# Patient Record
Sex: Female | Born: 1945 | Race: White | Hispanic: No | Smoking: Never smoker
Health system: Southern US, Community
[De-identification: ages and names within clinical notes are randomized; demographics above are authoritative.]

## PROBLEM LIST (undated history)

## (undated) DIAGNOSIS — M109 Gout, unspecified: Secondary | ICD-10-CM

## (undated) DIAGNOSIS — F32A Depression, unspecified: Secondary | ICD-10-CM

## (undated) DIAGNOSIS — E119 Type 2 diabetes mellitus without complications: Secondary | ICD-10-CM

## (undated) DIAGNOSIS — F329 Major depressive disorder, single episode, unspecified: Secondary | ICD-10-CM

## (undated) DIAGNOSIS — E079 Disorder of thyroid, unspecified: Secondary | ICD-10-CM

## (undated) DIAGNOSIS — I10 Essential (primary) hypertension: Secondary | ICD-10-CM

## (undated) DIAGNOSIS — T7840XA Allergy, unspecified, initial encounter: Secondary | ICD-10-CM

## (undated) DIAGNOSIS — M779 Enthesopathy, unspecified: Secondary | ICD-10-CM

## (undated) DIAGNOSIS — F419 Anxiety disorder, unspecified: Secondary | ICD-10-CM

## (undated) DIAGNOSIS — M1990 Unspecified osteoarthritis, unspecified site: Secondary | ICD-10-CM

## (undated) DIAGNOSIS — J45909 Unspecified asthma, uncomplicated: Secondary | ICD-10-CM

## (undated) DIAGNOSIS — K219 Gastro-esophageal reflux disease without esophagitis: Secondary | ICD-10-CM

## (undated) HISTORY — DX: Major depressive disorder, single episode, unspecified: F32.9

## (undated) HISTORY — DX: Disorder of thyroid, unspecified: E07.9

## (undated) HISTORY — PX: EYE SURGERY: SHX253

## (undated) HISTORY — DX: Anxiety disorder, unspecified: F41.9

## (undated) HISTORY — DX: Gastro-esophageal reflux disease without esophagitis: K21.9

## (undated) HISTORY — PX: CHOLECYSTECTOMY: SHX55

## (undated) HISTORY — DX: Unspecified osteoarthritis, unspecified site: M19.90

## (undated) HISTORY — DX: Type 2 diabetes mellitus without complications: E11.9

## (undated) HISTORY — DX: Unspecified asthma, uncomplicated: J45.909

## (undated) HISTORY — DX: Essential (primary) hypertension: I10

## (undated) HISTORY — DX: Allergy, unspecified, initial encounter: T78.40XA

## (undated) HISTORY — DX: Depression, unspecified: F32.A

---

## 1970-04-02 HISTORY — PX: ABDOMINAL HYSTERECTOMY: SHX81

## 2005-03-08 ENCOUNTER — Ambulatory Visit: Payer: Self-pay | Admitting: Family Medicine

## 2006-03-13 ENCOUNTER — Ambulatory Visit: Payer: Self-pay

## 2011-01-15 ENCOUNTER — Ambulatory Visit: Payer: Self-pay | Admitting: General Surgery

## 2011-02-05 ENCOUNTER — Emergency Department: Payer: Self-pay | Admitting: Emergency Medicine

## 2014-01-15 DIAGNOSIS — F32A Depression, unspecified: Secondary | ICD-10-CM | POA: Insufficient documentation

## 2014-01-15 DIAGNOSIS — N952 Postmenopausal atrophic vaginitis: Secondary | ICD-10-CM | POA: Insufficient documentation

## 2014-01-15 DIAGNOSIS — M545 Low back pain, unspecified: Secondary | ICD-10-CM | POA: Insufficient documentation

## 2014-01-15 DIAGNOSIS — E039 Hypothyroidism, unspecified: Secondary | ICD-10-CM | POA: Insufficient documentation

## 2014-01-15 DIAGNOSIS — R0789 Other chest pain: Secondary | ICD-10-CM | POA: Insufficient documentation

## 2014-01-15 DIAGNOSIS — I129 Hypertensive chronic kidney disease with stage 1 through stage 4 chronic kidney disease, or unspecified chronic kidney disease: Secondary | ICD-10-CM | POA: Insufficient documentation

## 2014-01-15 DIAGNOSIS — F339 Major depressive disorder, recurrent, unspecified: Secondary | ICD-10-CM | POA: Insufficient documentation

## 2014-01-20 DIAGNOSIS — Z1211 Encounter for screening for malignant neoplasm of colon: Secondary | ICD-10-CM | POA: Insufficient documentation

## 2014-09-15 ENCOUNTER — Other Ambulatory Visit: Payer: Self-pay | Admitting: Family Medicine

## 2014-09-21 DIAGNOSIS — E669 Obesity, unspecified: Secondary | ICD-10-CM | POA: Insufficient documentation

## 2014-09-21 DIAGNOSIS — E039 Hypothyroidism, unspecified: Secondary | ICD-10-CM | POA: Insufficient documentation

## 2014-09-21 DIAGNOSIS — E559 Vitamin D deficiency, unspecified: Secondary | ICD-10-CM | POA: Insufficient documentation

## 2014-09-21 DIAGNOSIS — I130 Hypertensive heart and chronic kidney disease with heart failure and stage 1 through stage 4 chronic kidney disease, or unspecified chronic kidney disease: Secondary | ICD-10-CM | POA: Insufficient documentation

## 2014-09-21 DIAGNOSIS — R609 Edema, unspecified: Secondary | ICD-10-CM | POA: Insufficient documentation

## 2014-09-21 DIAGNOSIS — F329 Major depressive disorder, single episode, unspecified: Secondary | ICD-10-CM | POA: Insufficient documentation

## 2014-09-21 DIAGNOSIS — M109 Gout, unspecified: Secondary | ICD-10-CM | POA: Insufficient documentation

## 2014-09-21 DIAGNOSIS — F32A Depression, unspecified: Secondary | ICD-10-CM | POA: Insufficient documentation

## 2014-09-21 DIAGNOSIS — N183 Chronic kidney disease, stage 3 unspecified: Secondary | ICD-10-CM

## 2014-09-21 DIAGNOSIS — E7800 Pure hypercholesterolemia, unspecified: Secondary | ICD-10-CM | POA: Insufficient documentation

## 2014-09-21 DIAGNOSIS — E1122 Type 2 diabetes mellitus with diabetic chronic kidney disease: Secondary | ICD-10-CM | POA: Insufficient documentation

## 2014-09-21 DIAGNOSIS — R928 Other abnormal and inconclusive findings on diagnostic imaging of breast: Secondary | ICD-10-CM | POA: Insufficient documentation

## 2014-09-21 DIAGNOSIS — G4700 Insomnia, unspecified: Secondary | ICD-10-CM | POA: Insufficient documentation

## 2014-09-21 DIAGNOSIS — E668 Other obesity: Secondary | ICD-10-CM | POA: Insufficient documentation

## 2014-09-21 DIAGNOSIS — E119 Type 2 diabetes mellitus without complications: Secondary | ICD-10-CM | POA: Insufficient documentation

## 2014-09-21 DIAGNOSIS — N1832 Type 2 diabetes mellitus with diabetic chronic kidney disease: Secondary | ICD-10-CM | POA: Insufficient documentation

## 2014-09-21 DIAGNOSIS — K219 Gastro-esophageal reflux disease without esophagitis: Secondary | ICD-10-CM | POA: Insufficient documentation

## 2014-09-21 HISTORY — DX: Chronic kidney disease, stage 3 unspecified: N18.30

## 2014-10-22 ENCOUNTER — Other Ambulatory Visit: Payer: Self-pay | Admitting: Family Medicine

## 2014-10-22 NOTE — Telephone Encounter (Signed)
Pt is requesting samples of JANUVIA  100 MG if possible.  CB#[phone]

## 2014-10-26 NOTE — Telephone Encounter (Signed)
Pt is calling back to check on samples.  [phone]:[phone]

## 2014-10-26 NOTE — Telephone Encounter (Signed)
Ok to leave if available. Thanks.

## 2014-10-26 NOTE — Telephone Encounter (Signed)
Called Terryann, regarding the Januvia 100mg  samples that pt had requested. Aware that samples are ready for pick up. Gave 4 packets, worth for 28 days.

## 2014-11-08 ENCOUNTER — Encounter: Payer: Self-pay | Admitting: Family Medicine

## 2014-11-08 ENCOUNTER — Ambulatory Visit (INDEPENDENT_AMBULATORY_CARE_PROVIDER_SITE_OTHER): Payer: Medicare Other | Admitting: Family Medicine

## 2014-11-08 VITALS — BP 116/64 | HR 84 | Temp 98.7°F | Resp 16 | Ht 63.0 in | Wt 213.0 lb

## 2014-11-08 DIAGNOSIS — Z0000 Encounter for general adult medical examination without abnormal findings: Secondary | ICD-10-CM

## 2014-11-08 DIAGNOSIS — Z1211 Encounter for screening for malignant neoplasm of colon: Secondary | ICD-10-CM

## 2014-11-08 DIAGNOSIS — E559 Vitamin D deficiency, unspecified: Secondary | ICD-10-CM

## 2014-11-08 DIAGNOSIS — Z1239 Encounter for other screening for malignant neoplasm of breast: Secondary | ICD-10-CM

## 2014-11-08 DIAGNOSIS — N183 Chronic kidney disease, stage 3 unspecified: Secondary | ICD-10-CM

## 2014-11-08 DIAGNOSIS — E7800 Pure hypercholesterolemia, unspecified: Secondary | ICD-10-CM

## 2014-11-08 DIAGNOSIS — E119 Type 2 diabetes mellitus without complications: Secondary | ICD-10-CM

## 2014-11-08 DIAGNOSIS — E039 Hypothyroidism, unspecified: Secondary | ICD-10-CM | POA: Diagnosis not present

## 2014-11-08 MED ORDER — GLIPIZIDE ER 5 MG PO TB24: 5.0000 mg | ORAL_TABLET | Freq: Every day | ORAL | Status: DC

## 2014-11-08 NOTE — Progress Notes (Signed)
Patient ID: Donna Malone, female   DOB: Dec 30, 1945, 69 y.o.   MRN: [id number]       Patient: Donna Malone, Female    DOB: [birth year], 69 y.o.   MRN: [id number] Visit Date: 11/08/2014  Today's Provider: Margarita Rana, MD   Chief Complaint  Patient presents with  . Medicare Wellness  . Diabetes   Subjective:    Annual wellness visit Donna Malone is a 69 y.o. female. She feels well. She reports she is not exercising, stays active with grandchild. She reports she is sleeping well. 03/12/06 Pap-normal hysterectomy 1/72 12/08/13 Mammogram BI-RADS 2 02/07/12 BMD-WNL 12/03/13 EKG    Diabetes She presents for her follow-up diabetic visit. She has type 2 diabetes mellitus. Her disease course has been stable. There are no hypoglycemic associated symptoms. Associated symptoms include fatigue. Pertinent negatives for diabetes include no blurred vision, no chest pain, no foot paresthesias, no polydipsia, no polyphagia, no polyuria, no visual change, no weakness and no weight loss. Symptoms are stable. Current diabetic treatment includes oral agent (dual therapy). She is compliant with treatment all of the time. Her weight is stable. She has not had a previous visit with a dietitian. She never participates in exercise. Her breakfast blood glucose is taken between 8-9 am. Her breakfast blood glucose range is generally 140-180 mg/dl. She does not see a podiatrist.Eye exam is current.    -----------------------------------------------------------   Review of Systems  Constitutional: Positive for fatigue. Negative for weight loss.  Eyes: Positive for itching. Negative for blurred vision.  Respiratory: Negative.   Cardiovascular: Negative.  Negative for chest pain.  Gastrointestinal: Negative.   Endocrine: Positive for heat intolerance. Negative for polydipsia, polyphagia and polyuria.  Genitourinary: Negative.   Musculoskeletal: Positive for back pain and arthralgias.  Skin:  Negative.   Allergic/Immunologic: Negative.   Neurological: Negative for weakness.  Hematological: Negative.   Psychiatric/Behavioral: Positive for decreased concentration.    History   Social History  . Marital Status: Married    Spouse Name: [name]  . Number of Children: 3  . Years of Education: N/A   Occupational History  . Not on file.   Social History Main Topics  . Smoking status: Never Smoker   . Smokeless tobacco: Never Used  . Alcohol Use: No  . Drug Use: No  . Sexual Activity: Not on file   Other Topics Concern  . Not on file   Social History Narrative    Patient Active Problem List   Diagnosis Date Noted  . Abnormal finding on mammography 09/21/2014  . Acquired hypothyroidism 09/21/2014  . Adult BMI 30+ 09/21/2014  . Chronic kidney disease (CKD), stage III (moderate) 09/21/2014  . Diabetes mellitus, type 2 09/21/2014  . Clinical depression 09/21/2014  . Accumulation of fluid in tissues 09/21/2014  . Acid reflux 09/21/2014  . Gout 09/21/2014  . Hypercholesteremia 09/21/2014  . Heart & renal disease, hypertensive, with heart failure 09/21/2014  . Cannot sleep 09/21/2014  . Extreme obesity 09/21/2014  . Avitaminosis D 09/21/2014  . Adult hypothyroidism 01/15/2014  . LBP (low back pain) 01/15/2014  . Chest pain 01/15/2014  . Atrophic vaginitis 01/15/2014    Past Surgical History  Procedure Laterality Date  . Abdominal hysterectomy  04/1970    partial  . Cholecystectomy  late 1990's    Her family history includes CAD in her mother; Diabetes in her cousin; Heart attack in her mother; Heart disease in her father.    Previous Medications   ALLOPURINOL (  ZYLOPRIM) 100 MG TABLET    Take 100 mg by mouth daily.    ASPIRIN 81 MG TABLET    Take 81 mg by mouth daily.    ATORVASTATIN (LIPITOR) 40 MG TABLET    Take 40 mg by mouth daily.    ESCITALOPRAM (LEXAPRO) 20 MG TABLET    Take 20 mg by mouth daily.    FUROSEMIDE (LASIX) 40 MG TABLET    Take 40 mg by  mouth daily.    LEVOTHYROXINE (SYNTHROID, LEVOTHROID) 75 MCG TABLET    Take 75 mcg by mouth daily before breakfast.    LISINOPRIL (PRINIVIL,ZESTRIL) 5 MG TABLET    Take 5 mg by mouth daily.    METFORMIN (GLUCOPHAGE) 1000 MG TABLET    Take 1,000 mg by mouth 2 (two) times daily with a meal.    OMEGA 3 340 MG CPDR    Take 1 capsule by mouth daily.    ONE TOUCH ULTRA TEST TEST STRIP    CHECK FASTING BLOOD SUGAR ONCE A DAY   POTASSIUM CHLORIDE (MICRO-K) 10 MEQ CR CAPSULE    Take 10 mEq by mouth daily.    RANITIDINE (ZANTAC) 150 MG TABLET    Take 1 tablet by mouth 2 (two) times daily.   TRAZODONE (DESYREL) 100 MG TABLET    Take 100 mg by mouth at bedtime.    VITAMIN D, ERGOCALCIFEROL, (DRISDOL) 50000 UNITS CAPS CAPSULE    Take 50,000 Units by mouth every 7 (seven) days.     Patient Care Team: Margarita Rana, MD as PCP - General (Family Medicine)     Objective:   Vitals: BP 116/64 mmHg  Pulse 84  Temp(Src) 98.7 F (37.1 C) (Oral)  Resp 16  Ht 5\' 3"  (1.6 m)  Wt 213 lb (96.616 kg)  BMI 37.74 kg/m2  SpO2 98%  Physical Exam  Constitutional: She is oriented to person, place, and time. She appears well-developed and well-nourished.  HENT:  Head: Normocephalic and atraumatic.  Right Ear: Tympanic membrane, external ear and ear canal normal.  Left Ear: Tympanic membrane, external ear and ear canal normal.  Nose: Nose normal.  Mouth/Throat: Uvula is midline, oropharynx is clear and moist and mucous membranes are normal.  Eyes: Conjunctivae, EOM and lids are normal. Pupils are equal, round, and reactive to light.  Neck: Trachea normal and normal range of motion. Neck supple. Carotid bruit is not present. No thyroid mass and no thyromegaly present.  Cardiovascular: Normal rate, regular rhythm and normal heart sounds.   Pulmonary/Chest: Effort normal and breath sounds normal.  Abdominal: Soft. Normal appearance and bowel sounds are normal. There is no hepatosplenomegaly. There is no tenderness.    Genitourinary: No breast swelling, tenderness or discharge.  Musculoskeletal: Normal range of motion.  Lymphadenopathy:    She has no cervical adenopathy.    She has no axillary adenopathy.  Neurological: She is alert and oriented to person, place, and time. She has normal strength. No cranial nerve deficit.  Skin: Skin is warm, dry and intact.  Psychiatric: She has a normal mood and affect. Her speech is normal and behavior is normal. Judgment and thought content normal. Cognition and memory are normal.   Diabetic Foot Exam - Simple   Simple Foot Form  Diabetic Foot exam was performed with the following findings:  Yes 11/08/2014  4:33 PM  Visual Inspection  No deformities, no ulcerations, no other skin breakdown bilaterally:  Yes  Sensation Testing  Intact to touch and monofilament testing bilaterally:  Yes  Pulse Check  Posterior Tibialis and Dorsalis pulse intact bilaterally:  Yes  Comments       Activities of Daily Living In your present state of health, do you have any difficulty performing the following activities: 11/08/2014  Hearing? N  Vision? N  Difficulty concentrating or making decisions? Y  Walking or climbing stairs? N  Dressing or bathing? N  Doing errands, shopping? N    Fall Risk Assessment Fall Risk  11/08/2014  Falls in the past year? No     Depression Screen PHQ 2/9 Scores 11/08/2014  PHQ - 2 Score 0    Cognitive Testing - 6-CIT  Correct? Score   What year is it? yes 0 0 or 4  What month is it? yes 0 0 or 3  Memorize:    Pia Mau,  40,  [place] [address]  [place]      What time is it? (within 1 hour) yes 0 0 or 3  Count backwards from 20 yes 0 0, 2, or 4  Name the months of the year yes 0 0, 2, or 4  Repeat name & address above yes 0 0, 2, 4, 6, 8, or 10       TOTAL SCORE  0/28   Interpretation:  Normal  Normal (0-7) Abnormal (8-28)       Assessment & Plan:     Annual Wellness Visit  Reviewed patient's Family Medical History Reviewed and  updated list of patient's medical providers Assessment of cognitive impairment was done Assessed patient's functional ability Established a written schedule for health screening [place] Completed and Reviewed  Exercise Activities and Dietary recommendations Goals    . Exercise 150 minutes per week (moderate activity)    . Peak Blood Glucose < 180       Immunization History  Administered Date(s) Administered  . Pneumococcal Conjugate-13 12/03/2013  . Pneumococcal Polysaccharide-23 03/21/2011  . Tdap 04/09/2008    Health Maintenance  Topic Date Due  . HEMOGLOBIN A1C  1945/07/25  . Hepatitis C Screening  01/18/44  . FOOT EXAM  04/22/1953  . OPHTHALMOLOGY EXAM  04/22/1953  . URINE MICROALBUMIN  04/22/1953  . MAMMOGRAM  04/22/1993  . COLONOSCOPY  04/22/1993  . ZOSTAVAX  04/23/2003  . DEXA SCAN  04/22/2008  . INFLUENZA VACCINE  11/01/2014  . TETANUS/TDAP  04/09/2018  . PNA vac Low Risk Adult  Completed         1. Medicare annual wellness visit, subsequent Stable. Patient advised to continue eating healthy and exercise daily.  2. Type 2 diabetes mellitus without complication Not to goal. Patient started on glipizide 5 mg as below. Patient advised to D/C Januvia. Also warned not to take if does not eat. Recheck in 3 months.  - Hemoglobin A1c - Microalbumin, urine - glipiZIDE (GLUCOTROL XL) 5 MG 24 hr tablet; Take 1 tablet (5 mg total) by mouth daily with breakfast.  Dispense: 90 tablet; Refill: 3  3. Acquired hypothyroidism - TSH  4. Chronic kidney disease (CKD), stage III (moderate) - CBC with Differential/Platelet - Comprehensive metabolic panel  5. Avitaminosis D - Vit D  25 hydroxy (rtn osteoporosis monitoring)  6. Hypercholesteremia - Lipid Panel With LDL/HDL Ratio  7. Breast cancer screening   8. Colon cancer screening - Cologuard   Patient seen and examined by Dr. Jerrell Belfast, and note scribed by Philbert Riser. Dimas,  CMA.  I have reviewed the document for accuracy and completeness and I agree  with above. Jerrell Belfast, MD   Margarita Rana, MD      ------------------------------------------------------------------------------------------------------------

## 2014-11-09 LAB — COMPREHENSIVE METABOLIC PANEL
ALT: 16 IU/L (ref 0–32)
AST: 20 IU/L (ref 0–40)
Albumin/Globulin Ratio: 1.6 (ref 1.1–2.5)
Albumin: 4.2 g/dL (ref 3.5–4.8)
Alkaline Phosphatase: 69 IU/L (ref 39–117)
BILIRUBIN TOTAL: 0.4 mg/dL (ref 0.0–1.2)
BUN/Creatinine Ratio: 9 — ABNORMAL LOW (ref 11–26)
BUN: 13 mg/dL (ref 8–27)
CHLORIDE: 94 mmol/L — AB (ref 97–108)
CO2: 25 mmol/L (ref 18–29)
Calcium: 9.7 mg/dL (ref 8.7–10.3)
Creatinine, Ser: 1.37 mg/dL — ABNORMAL HIGH (ref 0.57–1.00)
GFR calc Af Amer: 45 mL/min/{1.73_m2} — ABNORMAL LOW (ref >59)
GFR, EST NON AFRICAN AMERICAN: 39 mL/min/{1.73_m2} — AB (ref >59)
GLUCOSE: 138 mg/dL — AB (ref 65–99)
Globulin, Total: 2.7 g/dL (ref 1.5–4.5)
Potassium: 4.6 mmol/L (ref 3.5–5.2)
Sodium: 139 mmol/L (ref 134–144)
Total Protein: 6.9 g/dL (ref 6.0–8.5)

## 2014-11-09 LAB — CBC WITH DIFFERENTIAL/PLATELET
BASOS: 1 %
Basophils Absolute: 0.1 10*3/uL (ref 0.0–0.2)
EOS (ABSOLUTE): 0.3 10*3/uL (ref 0.0–0.4)
Eos: 3 %
HEMOGLOBIN: 13.4 g/dL (ref 11.1–15.9)
Hematocrit: 38.8 % (ref 34.0–46.6)
IMMATURE GRANULOCYTES: 0 %
Immature Grans (Abs): 0 10*3/uL (ref 0.0–0.1)
LYMPHS ABS: 4.1 10*3/uL — AB (ref 0.7–3.1)
Lymphs: 35 %
MCH: 30.6 pg (ref 26.6–33.0)
MCHC: 34.5 g/dL (ref 31.5–35.7)
MCV: 89 fL (ref 79–97)
Monocytes Absolute: 1.2 10*3/uL — ABNORMAL HIGH (ref 0.1–0.9)
Monocytes: 10 %
Neutrophils Absolute: 6 10*3/uL (ref 1.4–7.0)
Neutrophils: 51 %
Platelets: 398 10*3/uL — ABNORMAL HIGH (ref 150–379)
RBC: 4.38 x10E6/uL (ref 3.77–5.28)
RDW: 14.2 % (ref 12.3–15.4)
WBC: 11.7 10*3/uL — ABNORMAL HIGH (ref 3.4–10.8)

## 2014-11-09 LAB — LIPID PANEL WITH LDL/HDL RATIO
CHOLESTEROL TOTAL: 232 mg/dL — AB (ref 100–199)
HDL: 52 mg/dL (ref >39)
TRIGLYCERIDES: 477 mg/dL — AB (ref 0–149)

## 2014-11-09 LAB — TSH: TSH: 0.957 u[IU]/mL (ref 0.450–4.500)

## 2014-11-09 LAB — VITAMIN D 25 HYDROXY (VIT D DEFICIENCY, FRACTURES): Vit D, 25-Hydroxy: 43.8 ng/mL (ref 30.0–100.0)

## 2014-11-09 LAB — HEMOGLOBIN A1C
Est. average glucose Bld gHb Est-mCnc: 131 mg/dL
HEMOGLOBIN A1C: 6.2 % — AB (ref 4.8–5.6)

## 2014-11-09 LAB — MICROALBUMIN, URINE: Microalbumin, Urine: 32.8 ug/mL

## 2014-11-10 ENCOUNTER — Telehealth: Payer: Self-pay

## 2014-11-10 NOTE — Telephone Encounter (Signed)
-----   Message from Margarita Rana, MD sent at 11/09/2014  9:54 PM EDT ----- Labs stable. HgbA1c is stable at 6.2.  White count is very mildly elevated. Recheck in 4 weeks. Triglycerides up. Make sure to work on decreasing sugar, eating healthy, continue cholesterol medication and recheck in 3 months. Thanks.

## 2014-11-10 NOTE — Telephone Encounter (Signed)
Left message to call back.  Thanks, 

## 2014-11-10 NOTE — Telephone Encounter (Signed)
Advised pt of the notes below, pt verbalized fully understanding.  Thanks,

## 2014-11-16 ENCOUNTER — Telehealth: Payer: Self-pay | Admitting: Family Medicine

## 2014-11-16 NOTE — Telephone Encounter (Signed)
If she has been on this continue this medication

## 2014-11-16 NOTE — Telephone Encounter (Signed)
See below

## 2014-11-16 NOTE — Telephone Encounter (Signed)
[facility] received the RX for glipiZIDE (GLUCOTROL XL) 5 MG 24 hr tablet and they have a note that pt is allergic to sulfa drugs and she wanted to make sure we wanted to continue with the RX. I advised that Dr. Venia Minks is out of the office this week. Thanks TNP

## 2014-11-16 NOTE — Telephone Encounter (Signed)
I cannot remember if glimepiride has the same potential out allergic response. Consider glimepiride at 4 mg every morning. If he comes back with the same response libido.

## 2014-11-16 NOTE — Telephone Encounter (Signed)
Spoke with Klan from [facility], he stated that Glimepiride also has a potential for allergic reaction to sulfa drugs. Please advise. Thanks,

## 2014-11-16 NOTE — Telephone Encounter (Signed)
It looks like this is her first prescription for glipizide, in the notes on her last ov, it states that her Celesta Gentile was changed to glipizide. Please advise.  Thanks,

## 2014-11-17 NOTE — Telephone Encounter (Signed)
What is the reaction to sulfa drugs?

## 2014-11-17 NOTE — Telephone Encounter (Signed)
Please call patient and see what her reaction was. Think that is what Dr. Rosanna Randy was asking. Please let her know about possible allergic reaction. May need to go back to [facility], or try Glipizide understanding may have a reaction, may not as is it not a sulfa antibiotic.  If rash, can try it, if SOB, is going to have to go back to Januvia or try to be better about her diet. Thanks.

## 2014-11-17 NOTE — Telephone Encounter (Signed)
See below, please advise.  Thanks,

## 2014-11-18 NOTE — Telephone Encounter (Signed)
Left message to call back, will try again later.  Thanks,

## 2014-11-18 NOTE — Telephone Encounter (Signed)
Left message to call back. Will try to call back later.  Thanks,

## 2014-11-19 NOTE — Telephone Encounter (Signed)
Left message to call back. Will try again later.  Thanks,

## 2014-11-19 NOTE — Telephone Encounter (Signed)
Called pt on daughter's phone #, pt stated that she is on vacations, phone call got disconnected in the middle of the conversation; unable to reach her again.  will try to call her later.  Thanks,

## 2014-11-19 NOTE — Telephone Encounter (Signed)
Spoke with pt, pt stated that she is on vacation at this point and that she will be back by this Sunday, she also stated that she has been taking the Green for now and that she wants to try the Glipizide; she stated that she does not remember what the side effect for the sulfa antibiotics was. Advised pt to call if any questions or concerns.  Pharmacy was notified to approve the prescription, spoke with [facility] pharmacist.  Thanks,

## 2014-12-09 LAB — FECAL OCCULT BLOOD, GUAIAC: Fecal Occult Blood: NEGATIVE

## 2014-12-09 LAB — COLOGUARD

## 2014-12-13 ENCOUNTER — Ambulatory Visit: Payer: Medicare Other | Admitting: Family Medicine

## 2014-12-21 ENCOUNTER — Telehealth: Payer: Self-pay | Admitting: Family Medicine

## 2014-12-21 DIAGNOSIS — D72829 Elevated white blood cell count, unspecified: Secondary | ICD-10-CM

## 2014-12-21 NOTE — Telephone Encounter (Signed)
Ok to print out lab slip. Thanks.

## 2014-12-21 NOTE — Telephone Encounter (Signed)
Pt called wanting to know if she could pick up a lab slip tomorrow am to go ahead and get her labs done.  She has an appt Friday morning with you.     Her call back is [phone]  Thanks Con Memos

## 2014-12-22 DIAGNOSIS — D72829 Elevated white blood cell count, unspecified: Secondary | ICD-10-CM | POA: Insufficient documentation

## 2014-12-22 NOTE — Telephone Encounter (Signed)
Left message advising pt her lab slip is at the front desk.   Thanks,   -Mickel Baas

## 2014-12-23 ENCOUNTER — Telehealth: Payer: Self-pay

## 2014-12-23 LAB — CBC WITH DIFFERENTIAL/PLATELET
BASOS ABS: 0.1 10*3/uL (ref 0.0–0.2)
Basos: 1 %
EOS (ABSOLUTE): 0.3 10*3/uL (ref 0.0–0.4)
EOS: 3 %
HEMATOCRIT: 35.1 % (ref 34.0–46.6)
HEMOGLOBIN: 11.8 g/dL (ref 11.1–15.9)
IMMATURE GRANS (ABS): 0 10*3/uL (ref 0.0–0.1)
Immature Granulocytes: 0 %
LYMPHS ABS: 2.4 10*3/uL (ref 0.7–3.1)
Lymphs: 32 %
MCH: 30 pg (ref 26.6–33.0)
MCHC: 33.6 g/dL (ref 31.5–35.7)
MCV: 89 fL (ref 79–97)
Monocytes Absolute: 0.7 10*3/uL (ref 0.1–0.9)
Monocytes: 9 %
NEUTROS ABS: 4.1 10*3/uL (ref 1.4–7.0)
Neutrophils: 55 %
Platelets: 319 10*3/uL (ref 150–379)
RBC: 3.93 x10E6/uL (ref 3.77–5.28)
RDW: 14.7 % (ref 12.3–15.4)
WBC: 7.5 10*3/uL (ref 3.4–10.8)

## 2014-12-23 NOTE — Telephone Encounter (Signed)
Pt advised.   Thanks,   -Laura  

## 2014-12-23 NOTE — Telephone Encounter (Signed)
-----   Message from Margarita Rana, MD sent at 12/23/2014 10:30 AM EDT ----- Normal. Thanks.

## 2014-12-24 ENCOUNTER — Ambulatory Visit: Payer: Medicare Other | Admitting: Family Medicine

## 2015-01-07 ENCOUNTER — Encounter: Payer: Self-pay | Admitting: Family Medicine

## 2015-01-21 DIAGNOSIS — E119 Type 2 diabetes mellitus without complications: Secondary | ICD-10-CM | POA: Diagnosis not present

## 2015-01-21 DIAGNOSIS — H2513 Age-related nuclear cataract, bilateral: Secondary | ICD-10-CM | POA: Diagnosis not present

## 2015-01-21 DIAGNOSIS — H35039 Hypertensive retinopathy, unspecified eye: Secondary | ICD-10-CM | POA: Diagnosis not present

## 2015-01-21 DIAGNOSIS — I10 Essential (primary) hypertension: Secondary | ICD-10-CM | POA: Diagnosis not present

## 2015-02-09 ENCOUNTER — Ambulatory Visit (INDEPENDENT_AMBULATORY_CARE_PROVIDER_SITE_OTHER): Payer: Medicare Other | Admitting: Family Medicine

## 2015-02-09 ENCOUNTER — Encounter: Payer: Self-pay | Admitting: Family Medicine

## 2015-02-09 VITALS — BP 108/68 | HR 84 | Temp 97.7°F | Resp 16 | Wt 217.0 lb

## 2015-02-09 DIAGNOSIS — E119 Type 2 diabetes mellitus without complications: Secondary | ICD-10-CM

## 2015-02-09 DIAGNOSIS — Z23 Encounter for immunization: Secondary | ICD-10-CM | POA: Diagnosis not present

## 2015-02-09 LAB — POCT GLYCOSYLATED HEMOGLOBIN (HGB A1C)
ESTIMATED AVERAGE GLUCOSE: 134
Hemoglobin A1C: 6.4

## 2015-02-09 NOTE — Progress Notes (Signed)
Subjective:    Patient ID: Donna Malone, female    DOB: 31-Aug-1945, 69 y.o.   MRN: [id number]  Diabetes She presents for her follow-up (Last A1C 11/08/2014 6.2%) diabetic visit. She has type 2 diabetes mellitus. There are no hypoglycemic associated symptoms. Associated symptoms include fatigue and polydipsia. Pertinent negatives for diabetes include no blurred vision, no chest pain, no foot paresthesias, no foot ulcerations, no polyphagia, no polyuria, no visual change, no weakness and no weight loss. Current diabetic treatment includes oral agent (dual therapy) (Metformin 1000 mg BID, Glipizide 5 mg). She is compliant with treatment all of the time. Her home blood glucose trend is increasing steadily (pt reports this is due to changing Januvia to Glipizide (due to medication cost)). An ACE inhibitor/angiotensin II receptor blocker is being taken. Eye exam is current (October 2016- Dr. Sherran Needs- [facility]).  Hyperlipidemia This is a chronic problem. Recent lipid tests were reviewed and are high (11/08/2014- Total- 232, Trig- 477, HDL- 52, LDL- not calculated due to high triglycerides). Associated symptoms include shortness of breath (on exertion). Pertinent negatives include no chest pain, focal sensory loss, focal weakness, leg pain or myalgias. Current antihyperlipidemic treatment includes diet change and statins (Atorvastatin 40 mg). Compliance problems include adherence to diet.       Review of Systems  Constitutional: Positive for fatigue. Negative for weight loss.  Eyes: Negative for blurred vision.  Respiratory: Positive for shortness of breath (on exertion).   Cardiovascular: Negative for chest pain.  Endocrine: Positive for polydipsia. Negative for polyphagia and polyuria.  Musculoskeletal: Negative for myalgias.  Neurological: Negative for focal weakness and weakness.   BP 108/68 mmHg  Pulse 84  Temp(Src) 97.7 F (36.5 C) (Oral)  Resp 16  Wt 217 lb  (98.431 kg)  SpO2 97%   Patient Active Problem List   Diagnosis Date Noted  . Elevated WBC count 12/22/2014  . Abnormal finding on mammography 09/21/2014  . Acquired hypothyroidism 09/21/2014  . Adult BMI 30+ 09/21/2014  . Chronic kidney disease (CKD), stage III (moderate) 09/21/2014  . Diabetes mellitus, type 2 ([place]) 09/21/2014  . Clinical depression 09/21/2014  . Accumulation of fluid in tissues 09/21/2014  . Acid reflux 09/21/2014  . Gout 09/21/2014  . Hypercholesteremia 09/21/2014  . Heart & renal disease, hypertensive, with heart failure ([place]) 09/21/2014  . Cannot sleep 09/21/2014  . Extreme obesity ([place]) 09/21/2014  . Avitaminosis D 09/21/2014  . Adult hypothyroidism 01/15/2014  . LBP (low back pain) 01/15/2014  . Chest pain 01/15/2014  . Atrophic vaginitis 01/15/2014   Past Medical History  Diagnosis Date  . Asthma   . Diabetes mellitus without complication ([place])   . GERD (gastroesophageal reflux disease)   . Hypertension   . Thyroid disease   . Depression    Current Outpatient Prescriptions on File Prior to Visit  Medication Sig  . aspirin 81 MG tablet Take 81 mg by mouth daily.   Marland Kitchen atorvastatin (LIPITOR) 40 MG tablet Take 40 mg by mouth daily.   Marland Kitchen escitalopram (LEXAPRO) 20 MG tablet Take 20 mg by mouth daily.   . furosemide (LASIX) 40 MG tablet Take 40 mg by mouth daily.   Marland Kitchen glipiZIDE (GLUCOTROL XL) 5 MG 24 hr tablet Take 1 tablet (5 mg total) by mouth daily with breakfast.  . levothyroxine (SYNTHROID, LEVOTHROID) 75 MCG tablet Take 75 mcg by mouth daily before breakfast.   . lisinopril (PRINIVIL,ZESTRIL) 5 MG tablet Take 5 mg by mouth daily.   Marland Kitchen  metFORMIN (GLUCOPHAGE) 1000 MG tablet Take 1,000 mg by mouth 2 (two) times daily with a meal.   . Omega 3 340 MG CPDR Take 1 capsule by mouth daily.   . ONE TOUCH ULTRA TEST test strip CHECK FASTING BLOOD SUGAR ONCE A DAY  . potassium chloride (MICRO-K) 10 MEQ CR capsule Take 10 mEq by mouth daily.   . ranitidine  (ZANTAC) 150 MG tablet Take 1 tablet by mouth 2 (two) times daily.  . traZODone (DESYREL) 100 MG tablet Take 100 mg by mouth at bedtime.   . Vitamin D, Ergocalciferol, (DRISDOL) 50000 UNITS CAPS capsule Take 50,000 Units by mouth every 7 (seven) days.   Marland Kitchen allopurinol (ZYLOPRIM) 100 MG tablet Take 100 mg by mouth daily.    No current facility-administered medications on file prior to visit.   Allergies  Allergen Reactions  . Sulfa Antibiotics    Past Surgical History  Procedure Laterality Date  . Abdominal hysterectomy  04/1970    partial  . Cholecystectomy  late 1990's   Social History   Social History  . Marital Status: Married    Spouse Name: [name]  . Number of Children: 3  . Years of Education: N/A   Occupational History  . Not on file.   Social History Main Topics  . Smoking status: Never Smoker   . Smokeless tobacco: Never Used  . Alcohol Use: No  . Drug Use: No  . Sexual Activity: Not on file   Other Topics Concern  . Not on file   Social History Narrative   Family History  Problem Relation Age of Onset  . CAD Mother   . Heart attack Mother   . Heart disease Father   . Diabetes Cousin       Objective:   Physical Exam  Constitutional: She is oriented to person, place, and time. She appears well-developed and well-nourished.  Cardiovascular: Normal rate and regular rhythm.   Pulmonary/Chest: Effort normal and breath sounds normal.  Neurological: She is alert and oriented to person, place, and time.  Psychiatric: She has a normal mood and affect. Her behavior is normal. Judgment and thought content normal.   BP 108/68 mmHg  Pulse 84  Temp(Src) 97.7 F (36.5 C) (Oral)  Resp 16  Wt 217 lb (98.431 kg)  SpO2 97%      Assessment & Plan:  1. Type 2 diabetes mellitus without complication, without long-term current use of insulin (HCC) Stable with medication change. Continue lifestyle c - POCT glycosylated hemoglobin (Hb A1C) Results for orders placed  or performed in visit on 02/09/15  POCT glycosylated hemoglobin (Hb A1C)  Result Value Ref Range   Hemoglobin A1C 6.4    Est. average glucose Bld gHb Est-mCnc 134     2. Flu vaccine need Given today.   - Flu vaccine HIGH DOSE PF J  Margarita Rana, MD

## 2015-02-23 ENCOUNTER — Other Ambulatory Visit: Payer: Self-pay | Admitting: Family Medicine

## 2015-02-23 DIAGNOSIS — E559 Vitamin D deficiency, unspecified: Secondary | ICD-10-CM

## 2015-02-23 NOTE — Telephone Encounter (Signed)
Last Vitamin D was checked 11/08/2014 and was 43.8. Renaldo Fiddler, CMA

## 2015-02-25 ENCOUNTER — Other Ambulatory Visit: Payer: Self-pay | Admitting: Family Medicine

## 2015-04-01 ENCOUNTER — Other Ambulatory Visit: Payer: Self-pay | Admitting: Family Medicine

## 2015-04-01 DIAGNOSIS — I10 Essential (primary) hypertension: Secondary | ICD-10-CM

## 2015-04-01 DIAGNOSIS — R609 Edema, unspecified: Secondary | ICD-10-CM

## 2015-04-01 DIAGNOSIS — G4700 Insomnia, unspecified: Secondary | ICD-10-CM

## 2015-04-01 DIAGNOSIS — E119 Type 2 diabetes mellitus without complications: Secondary | ICD-10-CM

## 2015-04-01 DIAGNOSIS — E7800 Pure hypercholesterolemia, unspecified: Secondary | ICD-10-CM

## 2015-04-01 DIAGNOSIS — E1159 Type 2 diabetes mellitus with other circulatory complications: Secondary | ICD-10-CM | POA: Insufficient documentation

## 2015-04-01 DIAGNOSIS — E038 Other specified hypothyroidism: Secondary | ICD-10-CM

## 2015-04-01 DIAGNOSIS — I152 Hypertension secondary to endocrine disorders: Secondary | ICD-10-CM | POA: Insufficient documentation

## 2015-04-01 NOTE — Telephone Encounter (Signed)
LOV 02/09/2015 and has FU scheduled. Renaldo Fiddler, CMA

## 2015-04-13 ENCOUNTER — Other Ambulatory Visit: Payer: Self-pay | Admitting: Family Medicine

## 2015-04-13 DIAGNOSIS — K219 Gastro-esophageal reflux disease without esophagitis: Secondary | ICD-10-CM

## 2015-04-13 DIAGNOSIS — I10 Essential (primary) hypertension: Secondary | ICD-10-CM

## 2015-04-13 DIAGNOSIS — E559 Vitamin D deficiency, unspecified: Secondary | ICD-10-CM

## 2015-04-13 DIAGNOSIS — G4700 Insomnia, unspecified: Secondary | ICD-10-CM

## 2015-04-13 DIAGNOSIS — F32A Depression, unspecified: Secondary | ICD-10-CM

## 2015-04-13 DIAGNOSIS — E7800 Pure hypercholesterolemia, unspecified: Secondary | ICD-10-CM

## 2015-04-13 DIAGNOSIS — E038 Other specified hypothyroidism: Secondary | ICD-10-CM

## 2015-04-13 DIAGNOSIS — F329 Major depressive disorder, single episode, unspecified: Secondary | ICD-10-CM

## 2015-04-13 DIAGNOSIS — R609 Edema, unspecified: Secondary | ICD-10-CM

## 2015-04-13 DIAGNOSIS — E119 Type 2 diabetes mellitus without complications: Secondary | ICD-10-CM

## 2015-04-13 MED ORDER — LEVOTHYROXINE SODIUM 75 MCG PO TABS: ORAL_TABLET | ORAL | Status: DC

## 2015-04-13 MED ORDER — ATORVASTATIN CALCIUM 40 MG PO TABS: ORAL_TABLET | ORAL | Status: DC

## 2015-04-13 MED ORDER — RANITIDINE HCL 150 MG PO TABS: 150.0000 mg | ORAL_TABLET | Freq: Two times a day (BID) | ORAL | Status: DC

## 2015-04-13 MED ORDER — ESCITALOPRAM OXALATE 20 MG PO TABS: 20.0000 mg | ORAL_TABLET | Freq: Every day | ORAL | Status: DC

## 2015-04-13 MED ORDER — TRAZODONE HCL 100 MG PO TABS: ORAL_TABLET | ORAL | Status: DC

## 2015-04-13 MED ORDER — GLIPIZIDE ER 5 MG PO TB24
5.0000 mg | ORAL_TABLET | Freq: Every day | ORAL | Status: DC
Start: 2015-04-13 — End: 2016-05-22

## 2015-04-13 MED ORDER — LISINOPRIL 5 MG PO TABS: ORAL_TABLET | ORAL | Status: DC

## 2015-04-13 MED ORDER — FUROSEMIDE 40 MG PO TABS: 40.0000 mg | ORAL_TABLET | Freq: Every day | ORAL | Status: DC

## 2015-04-13 MED ORDER — POTASSIUM CHLORIDE ER 10 MEQ PO CPCR: ORAL_CAPSULE | ORAL | Status: DC

## 2015-04-13 MED ORDER — METFORMIN HCL 1000 MG PO TABS: ORAL_TABLET | ORAL | Status: DC

## 2015-04-13 NOTE — Telephone Encounter (Signed)
Spoke with pt she does not need Allopurinol or Vitamin D refilled yet.   Thanks,   -Mickel Baas

## 2015-04-13 NOTE — Telephone Encounter (Signed)
Pt called in saying she switched insurances to [facility]TA this year and she will need all new prescriptions.  She is using the mail order pharmacy for [facility]TA.  Her call back is [phone]  Thanks, Con Memos

## 2015-04-13 NOTE — Telephone Encounter (Signed)
Pt called with the mail order pharmacy info.  [place] phone [phone] and fax [phone]

## 2015-05-09 ENCOUNTER — Other Ambulatory Visit: Payer: Self-pay | Admitting: Family Medicine

## 2015-05-09 DIAGNOSIS — E559 Vitamin D deficiency, unspecified: Secondary | ICD-10-CM

## 2015-06-13 ENCOUNTER — Ambulatory Visit (INDEPENDENT_AMBULATORY_CARE_PROVIDER_SITE_OTHER): Payer: PPO | Admitting: Family Medicine

## 2015-06-13 ENCOUNTER — Encounter: Payer: Self-pay | Admitting: Family Medicine

## 2015-06-13 VITALS — BP 134/86 | HR 84 | Temp 97.8°F | Resp 16 | Wt 218.0 lb

## 2015-06-13 DIAGNOSIS — M545 Low back pain: Secondary | ICD-10-CM

## 2015-06-13 DIAGNOSIS — E119 Type 2 diabetes mellitus without complications: Secondary | ICD-10-CM

## 2015-06-13 DIAGNOSIS — M25562 Pain in left knee: Secondary | ICD-10-CM

## 2015-06-13 DIAGNOSIS — I10 Essential (primary) hypertension: Secondary | ICD-10-CM

## 2015-06-13 DIAGNOSIS — E7800 Pure hypercholesterolemia, unspecified: Secondary | ICD-10-CM | POA: Diagnosis not present

## 2015-06-13 LAB — POCT GLYCOSYLATED HEMOGLOBIN (HGB A1C): Hemoglobin A1C: 6.3

## 2015-06-13 NOTE — Progress Notes (Signed)
Patient ID: Donna Malone, female   DOB: 1946-01-14, 70 y.o.   MRN: [id number]         Patient: Donna Malone Female    DOB: 12-Aug-1945   70 y.o.   MRN: [id number] Visit Date: 06/13/2015  Today's Provider: Margarita Rana, MD   Chief Complaint  Patient presents with  . Hypertension  . Hyperlipidemia  . Diabetes  . Back Pain  . Knee Pain   Subjective:    Knee Pain  The incident occurred more than 1 week ago. There was no injury mechanism. The pain is present in the left knee. The quality of the pain is described as aching and shooting. The pain has been improving (Slightly improved) since onset. Pertinent negatives include no inability to bear weight, loss of motion, loss of sensation, muscle weakness, numbness or tingling. She has tried acetaminophen for the symptoms. The treatment provided no relief.  Back Pain This is a new problem. The current episode started 1 to 4 weeks ago. The problem has been gradually improving since onset. The pain is present in the lumbar spine. The quality of the pain is described as aching. The symptoms are aggravated by position, standing, sitting, coughing and stress. Pertinent negatives include no abdominal pain, bladder incontinence, bowel incontinence, fever, headaches, numbness, tingling or weakness. She has tried analgesics for the symptoms. The treatment provided no relief.      Diabetes Mellitus Type II, Follow-up:   Lab Results  Component Value Date   HGBA1C 6.4 02/09/2015   HGBA1C 6.2* 11/08/2014   Last seen for diabetes 3 months ago.  Management since then includes None. She reports excellent compliance with treatment. She is having side effects.  Current symptoms include none and have been stable. Home blood sugar records: 100's-120's  Episodes of hypoglycemia? no   Most Recent Eye Exam:  Weight trend: stable Current diet: in general, a "healthy" diet     ------------------------------------------------------------------------   Hypertension, follow-up:  BP Readings from Last 3 Encounters:  06/13/15 134/86  02/09/15 108/68  11/08/14 116/64    She was last seen for hypertension 3 months ago.  Management since that visit includes None .She reports excellent compliance with treatment. She is not having side effects.  She is not exercising. She is adherent to low salt diet.   She is experiencing none.  Patient denies chest pain, lower extremity edema and palpitations.   Cardiovascular risk factors include advanced age (older than 43 for men, 48 for women), diabetes mellitus, dyslipidemia and hypertension.  ------------------------------------------------------------------------    Lipid/Cholesterol, Follow-up:   Last seen for this 4 months ago.  Management since that visit includes None.  Last Lipid Panel:    Component Value Date/Time   CHOL 232* 11/08/2014 1659   TRIG 477* 11/08/2014 1659   HDL 52 11/08/2014 1659   [place] Comment 11/08/2014 1659    She reports excellent compliance with treatment. She is not having side effects.  Wt Readings from Last 3 Encounters:  06/13/15 218 lb (98.884 kg)  02/09/15 217 lb (98.431 kg)  11/08/14 213 lb (96.616 kg)    ------------------------------------------------------------------------        Allergies  Allergen Reactions  . Sulfa Antibiotics    Previous Medications   ACETAMINOPHEN (TYLENOL) 500 MG TABLET    Take 1,000 mg by mouth every 8 (eight) hours as needed.   ALLOPURINOL (ZYLOPRIM) 100 MG TABLET    Take 100 mg by mouth daily.    ASPIRIN 81 MG TABLET  Take 81 mg by mouth daily.    ATORVASTATIN (LIPITOR) 40 MG TABLET    Take 1 tablet by mouth  daily   ESCITALOPRAM (LEXAPRO) 20 MG TABLET    Take 1 tablet (20 mg total) by mouth daily.   FUROSEMIDE (LASIX) 40 MG TABLET    Take 1 tablet (40 mg total) by mouth daily.   GLIPIZIDE (GLUCOTROL XL) 5 MG 24 HR TABLET     Take 1 tablet (5 mg total) by mouth daily with breakfast.   LEVOTHYROXINE (SYNTHROID, LEVOTHROID) 75 MCG TABLET    Take 1 tablet by mouth  daily   LISINOPRIL (PRINIVIL,ZESTRIL) 5 MG TABLET    Take 1 tablet by mouth  daily   METFORMIN (GLUCOPHAGE) 1000 MG TABLET    Take 1 tablet by mouth two  times daily   OMEGA 3 340 MG CPDR    Take 1 capsule by mouth daily.    ONE TOUCH ULTRA TEST TEST STRIP    TEST BLOOD SUGAR ONCE DAILY   POTASSIUM CHLORIDE (MICRO-K) 10 MEQ CR CAPSULE    Take 1 capsule by mouth  daily   RANITIDINE (ZANTAC) 150 MG TABLET    Take 1 tablet (150 mg total) by mouth 2 (two) times daily.   TRAZODONE (DESYREL) 100 MG TABLET    Take 1 tablet by mouth at  bedtime   VITAMIN D, ERGOCALCIFEROL, (DRISDOL) 50000 UNITS CAPS CAPSULE    TAKE ONE CAPSULE BY MOUTH ONCE A WEEK    Review of Systems  Constitutional: Negative for fever, chills, diaphoresis, activity change, appetite change, fatigue and unexpected weight change.  Respiratory: Negative.   Cardiovascular: Negative.   Gastrointestinal: Negative.  Negative for abdominal pain and bowel incontinence.  Endocrine: Negative for cold intolerance and heat intolerance.  Genitourinary: Negative for bladder incontinence.  Musculoskeletal: Positive for back pain and arthralgias. Negative for myalgias, joint swelling, gait problem, neck pain and neck stiffness.  Neurological: Negative for dizziness, tingling, weakness, light-headedness, numbness and headaches.    Social History  Substance Use Topics  . Smoking status: Never Smoker   . Smokeless tobacco: Never Used  . Alcohol Use: No   Objective:   BP 134/86 mmHg  Pulse 84  Temp(Src) 97.8 F (36.6 C) (Oral)  Resp 16  Wt 218 lb (98.884 kg)  Results for orders placed or performed in visit on 06/13/15  POCT glycosylated hemoglobin (Hb A1C)  Result Value Ref Range   Hemoglobin A1C 6.3     Physical Exam  Constitutional: She is oriented to person, place, and time. She appears  well-developed and well-nourished.  Cardiovascular: Normal rate, regular rhythm and normal heart sounds.   Pulmonary/Chest: Effort normal and breath sounds normal.  Musculoskeletal: Normal range of motion.       Right knee: Tenderness found.       Left knee: Tenderness found.  Neurological: She is alert and oriented to person, place, and time.  Psychiatric: She has a normal mood and affect. Her behavior is normal. Judgment and thought content normal.        Assessment & Plan:     1. Essential hypertension Stable continue current meds.  2. Type 2 diabetes mellitus without complication, without long-term current use of insulin (HCC) A1C stable at 6.3%, recheck in 3-4 months.  - POCT glycosylated hemoglobin (Hb A1C)  3. Hypercholesteremia Still will recheck in the summer.   4. Bilateral low back pain, with sciatica presence unspecified Worsening; advised pt to increase walking and be careful about  heavy lifting.   5. Left knee pain New problem; will refer to orthopedics if not improved for worsening.      Patient was seen and examined by Jerrell Belfast, MD, and note scribed by Ashley Royalty, CMA.  I have reviewed the document for accuracy and completeness and I agree with above. - Jerrell Belfast, MD   Margarita Rana, MD  [facility]

## 2015-08-01 ENCOUNTER — Other Ambulatory Visit: Payer: Self-pay | Admitting: Family Medicine

## 2015-08-01 DIAGNOSIS — E559 Vitamin D deficiency, unspecified: Secondary | ICD-10-CM

## 2015-09-19 ENCOUNTER — Ambulatory Visit
Admission: RE | Admit: 2015-09-19 | Discharge: 2015-09-19 | Disposition: A | Discharge status: Home / Self Care | Payer: PPO | Source: Ambulatory Visit | Attending: Family Medicine | Admitting: Family Medicine

## 2015-09-19 ENCOUNTER — Encounter: Payer: Self-pay | Admitting: Family Medicine

## 2015-09-19 ENCOUNTER — Ambulatory Visit (INDEPENDENT_AMBULATORY_CARE_PROVIDER_SITE_OTHER): Payer: PPO | Admitting: Family Medicine

## 2015-09-19 VITALS — BP 110/64 | HR 86 | Temp 98.4°F | Resp 16 | Wt 214.0 lb

## 2015-09-19 DIAGNOSIS — J40 Bronchitis, not specified as acute or chronic: Secondary | ICD-10-CM | POA: Insufficient documentation

## 2015-09-19 DIAGNOSIS — R05 Cough: Secondary | ICD-10-CM | POA: Diagnosis not present

## 2015-09-19 MED ORDER — CEFDINIR 300 MG PO CAPS: 300.0000 mg | ORAL_CAPSULE | Freq: Two times a day (BID) | ORAL | Status: DC

## 2015-09-19 NOTE — Progress Notes (Signed)
Patient: Donna Malone Female    DOB: 1945-10-15   70 y.o.   MRN: [id number] Visit Date: 09/19/2015  Today's Provider: Margarita Rana, MD   Chief Complaint  Patient presents with  . URI   Subjective:    HPI    Upper Respiratory Infection: Patient complains of symptoms of a URI. Symptoms include congestion, cough and sore throat. Onset of symptoms was 1 week ago, gradually worsening since that time. She also c/o nasal congestion, post nasal drip, productive cough with  "olive green" colored sputum, sneezing and sore throat. Treatment to date: coricidin and cough drops, without relief. Did cough up green sputum in office today.        Allergies  Allergen Reactions  . Sulfa Antibiotics    Current Meds  Medication Sig  . acetaminophen (TYLENOL) 500 MG tablet Take 1,000 mg by mouth every 8 (eight) hours as needed.  Marland Kitchen aspirin 81 MG tablet Take 81 mg by mouth daily.   Marland Kitchen atorvastatin (LIPITOR) 40 MG tablet Take 1 tablet by mouth  daily  . escitalopram (LEXAPRO) 20 MG tablet Take 1 tablet (20 mg total) by mouth daily.  . furosemide (LASIX) 40 MG tablet Take 1 tablet (40 mg total) by mouth daily.  Marland Kitchen glipiZIDE (GLUCOTROL XL) 5 MG 24 hr tablet Take 1 tablet (5 mg total) by mouth daily with breakfast.  . levothyroxine (SYNTHROID, LEVOTHROID) 75 MCG tablet Take 1 tablet by mouth  daily  . lisinopril (PRINIVIL,ZESTRIL) 5 MG tablet Take 1 tablet by mouth  daily  . metFORMIN (GLUCOPHAGE) 1000 MG tablet Take 1 tablet by mouth two  times daily  . Omega 3 340 MG CPDR Take 1 capsule by mouth daily.   . ONE TOUCH ULTRA TEST test strip TEST BLOOD SUGAR ONCE DAILY  . potassium chloride (MICRO-K) 10 MEQ CR capsule Take 1 capsule by mouth  daily  . ranitidine (ZANTAC) 150 MG tablet Take 1 tablet (150 mg total) by mouth 2 (two) times daily.  . traZODone (DESYREL) 100 MG tablet Take 1 tablet by mouth at  bedtime  . Vitamin D, Ergocalciferol, (DRISDOL) 50000 units CAPS capsule TAKE ONE CAPSULE  BY MOUTH ONCE A WEEK    Review of Systems  Social History  Substance Use Topics  . Smoking status: Never Smoker   . Smokeless tobacco: Never Used  . Alcohol Use: No   Objective:   BP 110/64 mmHg  Pulse 86  Temp(Src) 98.4 F (36.9 C) (Oral)  Resp 16  Wt 214 lb (97.07 kg)  SpO2 95%  Physical Exam  Constitutional: She is oriented to person, place, and time. She appears well-developed and well-nourished.  HENT:  Head: Normocephalic.  Right Ear: External ear normal.  Left Ear: External ear normal.  Mouth/Throat: Oropharynx is clear and moist.  Turbinates are swollen  Neck: Normal range of motion. Neck supple. No thyromegaly present.  Cardiovascular: Normal rate, regular rhythm and normal heart sounds.   Pulmonary/Chest: Effort normal.  Scattered inspiratory rhonchi.  Lymphadenopathy:    She has no cervical adenopathy.  Neurological: She is alert and oriented to person, place, and time.  Psychiatric: She has a normal mood and affect. Her behavior is normal.  Vitals reviewed. BP 110/64 mmHg  Pulse 86  Temp(Src) 98.4 F (36.9 C) (Oral)  Resp 16  Wt 214 lb (97.07 kg)  SpO2 95%     Assessment & Plan:     1. Bronchitis New problem. Will order CXR as  below. Start Omnicef. Call if problems worsen or do not improve. - DG Chest 2 View; Future - cefdinir (OMNICEF) 300 MG capsule; Take 1 capsule (300 mg total) by mouth 2 (two) times daily.  Dispense: 14 capsule; Refill: 0     Patient seen and examined by Jerrell Belfast, MD, and note scribed by Renaldo Fiddler, CMA.   I have reviewed the document for accuracy and completeness and I agree with above. - Jerrell Belfast, MD   Margarita Rana, MD  [facility]

## 2015-09-20 ENCOUNTER — Telehealth: Payer: Self-pay

## 2015-09-20 NOTE — Telephone Encounter (Signed)
-----   Message from Margarita Rana, MD sent at 09/19/2015  4:33 PM EDT ----- Bronchitis, not pneumonia. Thanks.

## 2015-09-20 NOTE — Telephone Encounter (Signed)
LMTCB 09/20/2015  Thanks,   -Clary Meeker  

## 2015-09-21 NOTE — Telephone Encounter (Signed)
Pt advised.   Thanks,   -Prudencio Velazco  

## 2015-09-21 NOTE — Telephone Encounter (Signed)
Pt returned call.  Was unable to get nurse.  She would like a call back on her home number.

## 2015-10-29 ENCOUNTER — Encounter: Payer: Self-pay | Admitting: Emergency Medicine

## 2015-10-29 ENCOUNTER — Emergency Department
Admission: EM | Admit: 2015-10-29 | Discharge: 2015-10-30 | Disposition: A | Discharge status: Home / Self Care | Payer: PPO | Attending: Emergency Medicine | Admitting: Emergency Medicine

## 2015-10-29 ENCOUNTER — Emergency Department: Payer: PPO

## 2015-10-29 DIAGNOSIS — M79671 Pain in right foot: Secondary | ICD-10-CM | POA: Diagnosis not present

## 2015-10-29 DIAGNOSIS — E119 Type 2 diabetes mellitus without complications: Secondary | ICD-10-CM | POA: Diagnosis not present

## 2015-10-29 DIAGNOSIS — M79606 Pain in leg, unspecified: Secondary | ICD-10-CM | POA: Diagnosis not present

## 2015-10-29 DIAGNOSIS — M10071 Idiopathic gout, right ankle and foot: Secondary | ICD-10-CM | POA: Diagnosis not present

## 2015-10-29 DIAGNOSIS — M109 Gout, unspecified: Secondary | ICD-10-CM

## 2015-10-29 DIAGNOSIS — Z7982 Long term (current) use of aspirin: Secondary | ICD-10-CM | POA: Insufficient documentation

## 2015-10-29 DIAGNOSIS — M7731 Calcaneal spur, right foot: Secondary | ICD-10-CM | POA: Diagnosis not present

## 2015-10-29 DIAGNOSIS — I10 Essential (primary) hypertension: Secondary | ICD-10-CM | POA: Diagnosis not present

## 2015-10-29 DIAGNOSIS — J45909 Unspecified asthma, uncomplicated: Secondary | ICD-10-CM | POA: Diagnosis not present

## 2015-10-29 HISTORY — DX: Gout, unspecified: M10.9

## 2015-10-29 HISTORY — DX: Enthesopathy, unspecified: M77.9

## 2015-10-29 LAB — CBC WITH DIFFERENTIAL/PLATELET
BASOS PCT: 1 %
Basophils Absolute: 0.1 10*3/uL (ref 0–0.1)
EOS ABS: 0.1 10*3/uL (ref 0–0.7)
EOS PCT: 1 %
HCT: 36.3 % (ref 35.0–47.0)
Hemoglobin: 12.5 g/dL (ref 12.0–16.0)
Lymphocytes Relative: 16 %
Lymphs Abs: 2.3 10*3/uL (ref 1.0–3.6)
MCH: 31.6 pg (ref 26.0–34.0)
MCHC: 34.4 g/dL (ref 32.0–36.0)
MCV: 91.7 fL (ref 80.0–100.0)
MONO ABS: 1.5 10*3/uL — AB (ref 0.2–0.9)
MONOS PCT: 11 %
NEUTROS PCT: 71 %
Neutro Abs: 9.8 10*3/uL — ABNORMAL HIGH (ref 1.4–6.5)
PLATELETS: 331 10*3/uL (ref 150–440)
RBC: 3.96 MIL/uL (ref 3.80–5.20)
RDW: 14.2 % (ref 11.5–14.5)
WBC: 13.7 10*3/uL — ABNORMAL HIGH (ref 3.6–11.0)

## 2015-10-29 LAB — BASIC METABOLIC PANEL
Anion gap: 12 (ref 5–15)
BUN: 17 mg/dL (ref 6–20)
CO2: 26 mmol/L (ref 22–32)
CREATININE: 1.2 mg/dL — AB (ref 0.44–1.00)
Calcium: 9.2 mg/dL (ref 8.9–10.3)
Chloride: 99 mmol/L — ABNORMAL LOW (ref 101–111)
GFR calc Af Amer: 51 mL/min — ABNORMAL LOW (ref >60)
GFR calc non Af Amer: 44 mL/min — ABNORMAL LOW (ref >60)
Glucose, Bld: 159 mg/dL — ABNORMAL HIGH (ref 65–99)
Potassium: 3.8 mmol/L (ref 3.5–5.1)
Sodium: 137 mmol/L (ref 135–145)

## 2015-10-29 LAB — URIC ACID: URIC ACID, SERUM: 11.3 mg/dL — AB (ref 2.3–6.6)

## 2015-10-29 LAB — SEDIMENTATION RATE: SED RATE: 75 mm/h — AB (ref 0–30)

## 2015-10-29 MED ORDER — COLCHICINE 0.6 MG PO TABS
1.2000 mg | ORAL_TABLET | ORAL | Status: AC
  Administered 2015-10-29: 1.2 mg via ORAL
  Filled 2015-10-29: qty 2

## 2015-10-29 MED ORDER — HYDROCODONE-ACETAMINOPHEN 5-325 MG PO TABS
1.0000 | ORAL_TABLET | Freq: Once | ORAL | Status: AC
  Administered 2015-10-29: 1 via ORAL
  Filled 2015-10-29: qty 1

## 2015-10-29 MED ORDER — COLCHICINE 0.6 MG PO TABS
0.6000 mg | ORAL_TABLET | ORAL | Status: AC
  Administered 2015-10-29: 0.6 mg via ORAL
  Filled 2015-10-29: qty 1

## 2015-10-29 NOTE — ED Notes (Signed)
Pt states pain better. Asked pt if we could try and get her up to walk. Pt emphatically says declined. Dr. Darlyn Chamber.

## 2015-10-29 NOTE — ED Notes (Signed)
Labs drawn and sent -

## 2015-10-29 NOTE — ED Triage Notes (Signed)
Bilateral foot pain started yesterday. States the whole foot hurts - rt worse than left. Hx of heel spurs and gout

## 2015-10-29 NOTE — ED Provider Notes (Signed)
[facility] Emergency Department Provider Note  ____________________________________________   First MD Initiated Contact with Patient 10/29/15 2056     (approximate)  I have reviewed the triage vital signs and the nursing notes.   HISTORY  Chief Complaint Foot Pain (bilateral)    HPI Donna Malone is a 70 y.o. female who reports a history of prior gout flares in her feet to presents for relatively acute onset of severe pain in her right foot that feels similar to her prior gout.  She states that she noticed it when she first woke up this morning and tried to bear weight on her foot when she got out of bed.  The pain has gotten steadily worse over the course of the day to the point that she called EMS to transport her to the emergency department.  She states that the pain is sharp and stabbing and mostly present in the dorsal aspect of her foot and the pain radiates up into her lower leg.  She starting to feel a small, mild amount of pain in her left foot as well but it is minimal.  She reports that she cannot bear weight with the affected foot given the amount of pain she experiences when she does so.  She denies fever/chills, chest pain, shortness of breath, nausea, vomiting, diarrhea, abdominal pain, dysuria.  She has not had any dietary changes recently to which she would assure reviewed a gout flare.  She does not take medicine chronically to suppress flareups.  She also has a history of bone spurs in her heel and she is worried that perhaps she is having pain from her bone spurs as well.   Past Medical History:  Diagnosis Date  . Asthma   . Bone spur    heel  . Depression   . Diabetes mellitus without complication ([place])   . GERD (gastroesophageal reflux disease)   . Gout   . Hypertension   . Thyroid disease     Patient Active Problem List   Diagnosis Date Noted  . Bronchitis 09/19/2015  . Hypertension 04/01/2015  . Elevated WBC count  12/22/2014  . Abnormal finding on mammography 09/21/2014  . Acquired hypothyroidism 09/21/2014  . Adult BMI 30+ 09/21/2014  . Chronic kidney disease (CKD), stage III (moderate) 09/21/2014  . Diabetes mellitus, type 2 ([place]) 09/21/2014  . Clinical depression 09/21/2014  . Accumulation of fluid in tissues 09/21/2014  . Acid reflux 09/21/2014  . Gout 09/21/2014  . Hypercholesteremia 09/21/2014  . 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 Cannot sleep 09/21/2014  . Extreme obesity ([place]) 09/21/2014  . Avitaminosis D 09/21/2014  . Adult hypothyroidism 01/15/2014  . LBP (low back pain) 01/15/2014  . Atrophic vaginitis 01/15/2014    Past Surgical History:  Procedure Laterality Date  . ABDOMINAL HYSTERECTOMY  04/1970   partial  . CHOLECYSTECTOMY  late 1990's    Prior to Admission medications   Medication Sig Start Date End Date Taking? Authorizing Provider  acetaminophen (TYLENOL) 500 MG tablet Take 1,000 mg by mouth every 8 (eight) hours as needed.    Historical Provider, MD  aspirin 81 MG tablet Take 81 mg by mouth daily.  06/21/10   Historical Provider, MD  atorvastatin (LIPITOR) 40 MG tablet Take 1 tablet by mouth  daily 04/13/15   Margarita Rana, MD  cefdinir (OMNICEF) 300 MG capsule Take 1 capsule (300 mg total) by mouth 2 (two) times daily. 09/19/15   Izora Gala  Venia Minks, MD  colchicine 0.6 MG tablet Take 1 tablet (0.6 mg total) by mouth 2 (two) times daily. 10/30/15 10/29/16  Hinda Kehr, MD  escitalopram (LEXAPRO) 20 MG tablet Take 1 tablet (20 mg total) by mouth daily. 04/13/15   Margarita Rana, MD  furosemide (LASIX) 40 MG tablet Take 1 tablet (40 mg total) by mouth daily. 04/13/15   Margarita Rana, MD  glipiZIDE (GLUCOTROL XL) 5 MG 24 hr tablet Take 1 tablet (5 mg total) by mouth daily with breakfast. 04/13/15   Margarita Rana, MD  HYDROcodone-acetaminophen (NORCO/VICODIN) 5-325 MG tablet Take 1-2 tablets by mouth every 4 (four) hours as needed for moderate  pain. 10/30/15   Hinda Kehr, MD  levothyroxine (SYNTHROID, LEVOTHROID) 75 MCG tablet Take 1 tablet by mouth  daily 04/13/15   Margarita Rana, MD  lisinopril (PRINIVIL,ZESTRIL) 5 MG tablet Take 1 tablet by mouth  daily 04/13/15   Margarita Rana, MD  metFORMIN (GLUCOPHAGE) 1000 MG tablet Take 1 tablet by mouth two  times daily 04/13/15   Margarita Rana, MD  Omega 3 340 MG CPDR Take 1 capsule by mouth daily.  06/21/10   Historical Provider, MD  ONE TOUCH ULTRA TEST test strip TEST BLOOD SUGAR ONCE DAILY 02/26/15   Margarita Rana, MD  potassium chloride (MICRO-K) 10 MEQ CR capsule Take 1 capsule by mouth  daily 04/13/15   Margarita Rana, MD  ranitidine (ZANTAC) 150 MG tablet Take 1 tablet (150 mg total) by mouth 2 (two) times daily. 04/13/15   Margarita Rana, MD  traZODone (DESYREL) 100 MG tablet Take 1 tablet by mouth at  bedtime 04/13/15   Margarita Rana, MD  Vitamin D, Ergocalciferol, (DRISDOL) 50000 units CAPS capsule TAKE ONE CAPSULE BY MOUTH ONCE A WEEK 08/01/15   Margarita Rana, MD    Allergies Sulfa antibiotics  Family History  Problem Relation Age of Onset  . CAD Mother   . Heart attack Mother   . Heart disease Father   . Diabetes Cousin     Social History Social History  Substance Use Topics  . Smoking status: Never Smoker  . Smokeless tobacco: Never Used  . Alcohol use No    Review of Systems Constitutional: No fever/chills Eyes: No visual changes. ENT: No sore throat. Cardiovascular: Denies chest pain. Respiratory: Denies shortness of breath. Gastrointestinal: No abdominal pain.  No nausea, no vomiting.  No diarrhea.  No constipation. Genitourinary: Negative for dysuria. Musculoskeletal: Severe pain in right foot Skin: Negative for rash. Neurological: Negative for headaches, focal weakness or numbness.  10-point ROS otherwise negative.  ____________________________________________   PHYSICAL EXAM:  VITAL SIGNS: ED Triage Vitals  Enc Vitals Group     BP 10/29/15 1815 (!)  120/52     Pulse Rate 10/29/15 1815 85     Resp --      Temp 10/29/15 1815 99.5 F (37.5 C)     Temp Source 10/29/15 1815 Oral     SpO2 10/29/15 1815 94 %     Weight 10/29/15 1815 210 lb (95.3 kg)     Height 10/29/15 1815 5' 3.5" (1.613 m)     Head Circumference --      Peak Flow --      Pain Score 10/29/15 1816 10     Pain Loc --      Pain Edu? --      Excl. in [place]? --     Constitutional: Alert and oriented. Well appearing and in no acute distress until she tries to move  her foot. Eyes: Conjunctivae are normal. PERRL. EOMI. Head: Atraumatic. Nose: No congestion/rhinnorhea. Mouth/Throat: Mucous membranes are moist.  Oropharynx non-erythematous. Neck: No stridor.  No meningeal signs.   Cardiovascular: Normal rate, regular rhythm. Good peripheral circulation. Grossly normal heart sounds.   Respiratory: Normal respiratory effort.  No retractions. Lungs CTAB. Gastrointestinal: Soft and nontender. No distention.  Musculoskeletal: Her right foot appears slightly swollen but has a normal color and normal capillary refill.  She has easily palpable distal pulses that are equal bilaterally.  She reports severe tenderness to palpation throughout her entire foot dorsally and on the plantar aspect.  She reports severe pain when she tries to move the foot.  There is no evidence of effusion, cellulitis, and no gross deformity or evidence of bony injury.  There is no sign of podagra. Neurologic:  Normal speech and language. No gross focal neurologic deficits are appreciated.  Skin:  Skin is warm, dry and intact. No rash noted. Psychiatric: Mood and affect are normal. Speech and behavior are normal.  ____________________________________________   LABS (all labs ordered are listed, but only abnormal results are displayed)  Labs Reviewed  CBC WITH DIFFERENTIAL/PLATELET - Abnormal; Notable for the following:       Result Value   WBC 13.7 (*)    Neutro Abs 9.8 (*)    Monocytes Absolute 1.5 (*)     All other components within normal limits  SEDIMENTATION RATE - Abnormal; Notable for the following:    Sed Rate 75 (*)    All other components within normal limits  URIC ACID - Abnormal; Notable for the following:    Uric Acid, Serum 11.3 (*)    All other components within normal limits  BASIC METABOLIC PANEL - Abnormal; Notable for the following:    Chloride 99 (*)    Glucose, Bld 159 (*)    Creatinine, Ser 1.20 (*)    GFR calc non Af Amer 44 (*)    GFR calc Af Amer 51 (*)    All other components within normal limits   ____________________________________________  EKG  None ____________________________________________  RADIOLOGY   Dg Foot Complete Right  Result Date: 10/29/2015 CLINICAL DATA:  Patient started having foot pain yesterday. No known injury. General foot pain. Some redness to top of foot. She is not able to bear weight or move without pain. Hx gout and spurs. EXAM: RIGHT FOOT COMPLETE - 3+ VIEW COMPARISON:  None. FINDINGS: No fracture.  No bone lesion. The joints are normally spaced and aligned. No significant arthropathic change. There is a small moderate plantar calcaneal spur and smaller dorsal calcaneal spur. Calcifications seen along the dorsalis pedis. IMPRESSION: 1. No fracture or acute finding. 2. Calcaneal spurs. Electronically Signed   By: Lajean Manes M.D.   On: 10/29/2015 21:28   ____________________________________________   PROCEDURES  Procedure(s) performed:   Procedures   ____________________________________________   INITIAL IMPRESSION / ASSESSMENT AND PLAN / ED COURSE  Pertinent labs & imaging results that were available during my care of the patient were reviewed by me and considered in my medical decision making (see chart for details).  The patient is very concerned about the pain in her foot and her inability to bear weight.  I explained that at this point it does not appear to be an emergent medical condition that is causing the  pain.  We are going to check some basic lab work including an ESR and uric acid level as well as a BMP and CBC.  I will also start treatment for a gout flare.  But I explained that pain in her right foot due to a gout flare is not an admittable diagnosis and the Medicare will not pay for her admission.  We will attempt to treat her symptoms and at her request due to her history of bone spurs I am also getting x-rays of her right foot though I have very low suspicion for an acute onset of bony injury, infectious cause, thromboembolic disorder, etc.   (Note that documentation was delayed due to multiple ED patients requiring immediate care.)   The patient's workup is consistent with an acute gout flare.  There are no acute findings on her x-ray.  Her uric acid level, sedimentation rate, and white blood cell count are all slightly elevated.  Her pain did improve slightly after colchicine.  I called and spoke with the pharmacist to verify the dosing of colchicine.  I provided colchicine 1.2 mg by mouth followed approximately one hour later with another 0.6 mg.  I then wrote a standard prescription of 0.6 mg by mouth twice daily until she can follow up with an outpatient doctor.  I chose the colchicine because I felt that at her age and with diabetes both glucocorticoids and NSAIDs were likely to have negative consequences and side effects.  I explained in two extensive bedside discussions one I could not admit her to the hospital for pain due to a gout flare.  She and her husband state that they understand and they will follow up as an outpatient.  I offered crutches or a walker but she states that she has both at home.  Because of anticipated trouble getting into the house, she is requesting EMS transport home although we did warn her that it is likely that insurance will not pay for the transportation.  She understands and still would like to proceed with this plan.     Clinical Course     ____________________________________________  FINAL CLINICAL IMPRESSION(S) / ED DIAGNOSES  Final diagnoses:  Acute gout of right foot, unspecified cause     MEDICATIONS GIVEN DURING THIS VISIT:  Medications  colchicine tablet 1.2 mg (1.2 mg Oral Given 10/29/15 2153)  HYDROcodone-acetaminophen (NORCO/VICODIN) 5-325 MG per tablet 1 tablet (1 tablet Oral Given 10/29/15 2153)  colchicine tablet 0.6 mg (0.6 mg Oral Given 10/29/15 2317)     NEW OUTPATIENT MEDICATIONS STARTED DURING THIS VISIT:  New Prescriptions   COLCHICINE 0.6 MG TABLET    Take 1 tablet (0.6 mg total) by mouth 2 (two) times daily.   HYDROCODONE-ACETAMINOPHEN (NORCO/VICODIN) 5-325 MG TABLET    Take 1-2 tablets by mouth every 4 (four) hours as needed for moderate pain.      Note:  This document was prepared using Dragon voice recognition software and may include unintentional dictation errors.    Hinda Kehr, MD 10/30/15 [phone]

## 2015-10-30 DIAGNOSIS — Z743 Need for continuous supervision: Secondary | ICD-10-CM | POA: Diagnosis not present

## 2015-10-30 DIAGNOSIS — L03115 Cellulitis of right lower limb: Secondary | ICD-10-CM | POA: Diagnosis not present

## 2015-10-30 MED ORDER — COLCHICINE 0.6 MG PO TABS: 0.6000 mg | ORAL_TABLET | Freq: Two times a day (BID) | ORAL | 2 refills | Status: DC

## 2015-10-30 MED ORDER — HYDROCODONE-ACETAMINOPHEN 5-325 MG PO TABS: 1.0000 | ORAL_TABLET | ORAL | 0 refills | Status: DC | PRN

## 2015-10-30 NOTE — ED Notes (Signed)
Family at bedside.Pt awaiting transport home via EMS.

## 2015-11-10 ENCOUNTER — Encounter: Payer: Self-pay | Admitting: Physician Assistant

## 2015-11-10 ENCOUNTER — Ambulatory Visit (INDEPENDENT_AMBULATORY_CARE_PROVIDER_SITE_OTHER): Payer: PPO | Admitting: Physician Assistant

## 2015-11-10 VITALS — BP 122/78 | HR 71 | Temp 97.6°F | Resp 16

## 2015-11-10 DIAGNOSIS — D72829 Elevated white blood cell count, unspecified: Secondary | ICD-10-CM | POA: Diagnosis not present

## 2015-11-10 DIAGNOSIS — E039 Hypothyroidism, unspecified: Secondary | ICD-10-CM | POA: Diagnosis not present

## 2015-11-10 DIAGNOSIS — M10471 Other secondary gout, right ankle and foot: Secondary | ICD-10-CM | POA: Diagnosis not present

## 2015-11-10 DIAGNOSIS — E7800 Pure hypercholesterolemia, unspecified: Secondary | ICD-10-CM

## 2015-11-10 DIAGNOSIS — I10 Essential (primary) hypertension: Secondary | ICD-10-CM | POA: Diagnosis not present

## 2015-11-10 DIAGNOSIS — N183 Chronic kidney disease, stage 3 unspecified: Secondary | ICD-10-CM

## 2015-11-10 DIAGNOSIS — R55 Syncope and collapse: Secondary | ICD-10-CM

## 2015-11-10 DIAGNOSIS — R5383 Other fatigue: Secondary | ICD-10-CM | POA: Diagnosis not present

## 2015-11-10 DIAGNOSIS — E119 Type 2 diabetes mellitus without complications: Secondary | ICD-10-CM | POA: Diagnosis not present

## 2015-11-10 LAB — GLUCOSE, POCT (MANUAL RESULT ENTRY): POC GLUCOSE: 126 mg/dL — AB (ref 70–99)

## 2015-11-10 MED ORDER — LISINOPRIL 5 MG PO TABS: ORAL_TABLET | ORAL | 3 refills | Status: DC

## 2015-11-10 MED ORDER — ALLOPURINOL 100 MG PO TABS: 100.0000 mg | ORAL_TABLET | Freq: Every day | ORAL | 6 refills | Status: DC

## 2015-11-10 NOTE — Progress Notes (Signed)
Patient: Donna Malone Female    DOB: 06-04-1945   70 y.o.   MRN: [id number] Visit Date: 11/10/2015  Today's Provider: Mar Daring, PA-C   Chief Complaint  Patient presents with  . Fatigue  . Gout   Subjective:    HPI Patient is here with c/o of feeling weak since she got discharge from the hospital. Reports that yesterday she was taking shower and reports she had to sit down to finish. She reports that when she got here and the elevator door opened she felt dizzy and reports still feels very tired. Symptoms include: lightheadedness, weakness, fatigue,shaky. She denies chest pain, palpitations. She reports checked her sugar this morning and it was 169 before medicines. After medicines it was 126.  She reports the dizziness as more of a pre-syncope prodrome. She reports it can happen at anytime in any position. She denies spinning sensation but states she more so gets tunnel vision when it occurs. She has not had any syncopal episodes or LOC.  Gout: Patient was seen at the ED on 07/29 for a Gout flare up. She reports that she stopped the Allopurinol a while back and wants to go back on it. She reports that the gout symptoms are better. It was located on right ankle and foot.    Allergies  Allergen Reactions  . Sulfa Antibiotics    Current Meds  Medication Sig  . acetaminophen (TYLENOL) 500 MG tablet Take 1,000 mg by mouth every 8 (eight) hours as needed.  Marland Kitchen aspirin 81 MG tablet Take 81 mg by mouth daily.   Marland Kitchen atorvastatin (LIPITOR) 40 MG tablet Take 1 tablet by mouth  daily  . colchicine 0.6 MG tablet Take 1 tablet (0.6 mg total) by mouth 2 (two) times daily.  Marland Kitchen escitalopram (LEXAPRO) 20 MG tablet Take 1 tablet (20 mg total) by mouth daily.  . furosemide (LASIX) 40 MG tablet Take 1 tablet (40 mg total) by mouth daily.  Marland Kitchen glipiZIDE (GLUCOTROL XL) 5 MG 24 hr tablet Take 1 tablet (5 mg total) by mouth daily with breakfast.  . levothyroxine (SYNTHROID, LEVOTHROID) 75  MCG tablet Take 1 tablet by mouth  daily  . lisinopril (PRINIVIL,ZESTRIL) 5 MG tablet Take 1 tablet by mouth  daily  . metFORMIN (GLUCOPHAGE) 1000 MG tablet Take 1 tablet by mouth two  times daily  . Omega 3 340 MG CPDR Take 1 capsule by mouth daily.   . ONE TOUCH ULTRA TEST test strip TEST BLOOD SUGAR ONCE DAILY  . potassium chloride (MICRO-K) 10 MEQ CR capsule Take 1 capsule by mouth  daily  . ranitidine (ZANTAC) 150 MG tablet Take 1 tablet (150 mg total) by mouth 2 (two) times daily.  . traZODone (DESYREL) 100 MG tablet Take 1 tablet by mouth at  bedtime  . Vitamin D, Ergocalciferol, (DRISDOL) 50000 units CAPS capsule TAKE ONE CAPSULE BY MOUTH ONCE A WEEK    Review of Systems  Constitutional: Positive for fatigue. Negative for chills, diaphoresis and fever.  HENT: Negative for congestion, ear pain, hearing loss and tinnitus.   Eyes: Negative for visual disturbance.  Respiratory: Positive for shortness of breath. Negative for cough, chest tightness and wheezing.   Cardiovascular: Negative for chest pain, palpitations and leg swelling.  Gastrointestinal: Negative for abdominal pain and nausea.  Endocrine: Negative for polydipsia, polyphagia and polyuria.  Musculoskeletal: Negative for arthralgias and back pain.  Neurological: Positive for dizziness and weakness. Negative for syncope, numbness and headaches.  Psychiatric/Behavioral: Negative for dysphoric mood and sleep disturbance. The patient is not nervous/anxious.     Social History  Substance Use Topics  . Smoking status: Never Smoker  . Smokeless tobacco: Never Used  . Alcohol use No   Objective:   BP 122/78 (BP Location: Right Arm, Patient Position: Sitting, Cuff Size: Normal)   Pulse 71   Temp 97.6 F (36.4 C) (Oral)   Resp 16   SpO2 98%   Physical Exam  Constitutional: She is oriented to person, place, and time. She appears well-developed and well-nourished. No distress.  HENT:  Head: Normocephalic and atraumatic.    Right Ear: Tympanic membrane, external ear and ear canal normal.  Left Ear: Tympanic membrane, external ear and ear canal normal.  Nose: Nose normal.  Mouth/Throat: Uvula is midline, oropharynx is clear and moist and mucous membranes are normal. No oropharyngeal exudate.  Eyes: EOM are normal. Pupils are equal, round, and reactive to light. Right eye exhibits no discharge. Left eye exhibits no discharge.  Neck: Normal range of motion. Neck supple. No JVD present. Carotid bruit is not present. No tracheal deviation present. No thyromegaly present.  Cardiovascular: Normal rate, regular rhythm and normal heart sounds.  Exam reveals no gallop and no friction rub.   No murmur heard. Pulmonary/Chest: Effort normal and breath sounds normal. No respiratory distress. She has no decreased breath sounds. She has no wheezes. She has no rales.  Musculoskeletal: Normal range of motion. She exhibits no edema.  Lymphadenopathy:    She has no cervical adenopathy.  Neurological: She is alert and oriented to person, place, and time. No cranial nerve deficit or sensory deficit. Coordination normal.  Skin: She is not diaphoretic.  Psychiatric: She has a normal mood and affect. Her behavior is normal. Judgment and thought content normal.  Vitals reviewed.     Assessment & Plan:     1. Other fatigue Glucose was 126 today in the office following taking her medications. Will check labs and f/u pending labs for cause. - POCT glucose (manual entry) - CBC With Differential - Basic Metabolic Panel (BMET)  2. Essential hypertension BP was stable today in the office. Orthostatics not done today due to dizziness happening at anytime, not with change of position. - lisinopril (PRINIVIL,ZESTRIL) 5 MG tablet; Take 1 tablet by mouth every other day  Dispense: 90 tablet; Refill: 3 - Basic Metabolic Panel (BMET)  3. Other secondary acute gout of right foot Acute flare resolved. Will start allopurinol back.  -  allopurinol (ZYLOPRIM) 100 MG tablet; Take 1 tablet (100 mg total) by mouth daily.  Dispense: 30 tablet; Refill: 6  4. Acquired hypothyroidism Will check labs and f/u pending results. - TSH  5. Type 2 diabetes mellitus without complication, without long-term current use of insulin ([place]) Will check labs and f/u pending results. - HgB A1c  6. Chronic kidney disease (CKD), stage III (moderate) Will check labs as below and f/u pending results. - Basic Metabolic Panel (BMET)  7. Elevated WBC count H/O this at ED. Most likely due to gout flare. Will check for routine to normal. - CBC With Differential  8. Hypercholesterolemia Will check labs as below and f/u pending results. - Lipid Profile  9. Pre-syncope EKG was normal today in the office. She is to call if symptoms continue and will refer to cardiology. Will f/u with her pending lab results. Would like to see her back in 2-4 weeks if symptoms continue. - EKG 12-Lead  Mar Daring, PA-C  [facility]

## 2015-11-11 ENCOUNTER — Telehealth: Payer: Self-pay

## 2015-11-11 LAB — CBC WITH DIFFERENTIAL
BASOS ABS: 0.1 10*3/uL (ref 0.0–0.2)
BASOS: 1 %
EOS (ABSOLUTE): 0.4 10*3/uL (ref 0.0–0.4)
Eos: 4 %
HEMOGLOBIN: 12.9 g/dL (ref 11.1–15.9)
Hematocrit: 38.1 % (ref 34.0–46.6)
IMMATURE GRANS (ABS): 0 10*3/uL (ref 0.0–0.1)
Immature Granulocytes: 0 %
LYMPHS: 37 %
Lymphocytes Absolute: 3.4 10*3/uL — ABNORMAL HIGH (ref 0.7–3.1)
MCH: 30.6 pg (ref 26.6–33.0)
MCHC: 33.9 g/dL (ref 31.5–35.7)
MCV: 91 fL (ref 79–97)
MONOCYTES: 9 %
Monocytes Absolute: 0.8 10*3/uL (ref 0.1–0.9)
NEUTROS PCT: 49 %
Neutrophils Absolute: 4.4 10*3/uL (ref 1.4–7.0)
RBC: 4.21 x10E6/uL (ref 3.77–5.28)
RDW: 14.4 % (ref 12.3–15.4)
WBC: 9 10*3/uL (ref 3.4–10.8)

## 2015-11-11 LAB — BASIC METABOLIC PANEL
BUN/Creatinine Ratio: 15 (ref 12–28)
BUN: 22 mg/dL (ref 8–27)
CALCIUM: 10 mg/dL (ref 8.7–10.3)
CHLORIDE: 99 mmol/L (ref 96–106)
CO2: 20 mmol/L (ref 18–29)
Creatinine, Ser: 1.42 mg/dL — ABNORMAL HIGH (ref 0.57–1.00)
GFR calc non Af Amer: 37 mL/min/{1.73_m2} — ABNORMAL LOW (ref >59)
GFR, EST AFRICAN AMERICAN: 43 mL/min/{1.73_m2} — AB (ref >59)
GLUCOSE: 116 mg/dL — AB (ref 65–99)
POTASSIUM: 5.1 mmol/L (ref 3.5–5.2)
Sodium: 139 mmol/L (ref 134–144)

## 2015-11-11 LAB — LIPID PANEL
CHOL/HDL RATIO: 4.7 ratio — AB (ref 0.0–4.4)
Cholesterol, Total: 161 mg/dL (ref 100–199)
HDL: 34 mg/dL — ABNORMAL LOW (ref >39)
LDL Calculated: 48 mg/dL (ref 0–99)
TRIGLYCERIDES: 397 mg/dL — AB (ref 0–149)
VLDL Cholesterol Cal: 79 mg/dL — ABNORMAL HIGH (ref 5–40)

## 2015-11-11 LAB — HEMOGLOBIN A1C
Est. average glucose Bld gHb Est-mCnc: 131 mg/dL
HEMOGLOBIN A1C: 6.2 % — AB (ref 4.8–5.6)

## 2015-11-11 LAB — TSH: TSH: 1.14 u[IU]/mL (ref 0.450–4.500)

## 2015-11-11 NOTE — Telephone Encounter (Signed)
Patient advised as directed below. Will give [place] a call to let us know when she is going to the lab.  Thanks,  -Zollie Clemence

## 2015-11-11 NOTE — Telephone Encounter (Signed)
-----   Message from Mar Daring, [place] sent at 11/11/2015 11:48 AM EDT ----- HgBA1c is pretty stable. Cholesterol has improved some as well. Kidney function is slightly declined. Make sure to hydrate well over next two weeks and we can recheck in 2 weeks.

## 2015-11-22 ENCOUNTER — Telehealth: Payer: Self-pay

## 2015-11-22 DIAGNOSIS — R7989 Other specified abnormal findings of blood chemistry: Secondary | ICD-10-CM

## 2015-11-22 NOTE — Telephone Encounter (Signed)
Pt says she is due for repeat lab work.  Please call when ready.   Thanks,   -Mickel Baas

## 2015-11-22 NOTE — Telephone Encounter (Signed)
Renal function ordered. May need to print lab slip and patient can get at her convenience. No need to fast.

## 2015-11-25 ENCOUNTER — Other Ambulatory Visit: Payer: Self-pay | Admitting: Family Medicine

## 2015-11-25 DIAGNOSIS — R748 Abnormal levels of other serum enzymes: Secondary | ICD-10-CM | POA: Diagnosis not present

## 2015-11-25 NOTE — Telephone Encounter (Signed)
LOV with you 11/10/2015. Renaldo Fiddler, CMA

## 2015-11-26 LAB — RENAL FUNCTION PANEL
ALBUMIN: 3.9 g/dL (ref 3.5–4.8)
BUN/Creatinine Ratio: 15 (ref 12–28)
BUN: 16 mg/dL (ref 8–27)
CALCIUM: 10 mg/dL (ref 8.7–10.3)
CHLORIDE: 99 mmol/L (ref 96–106)
CO2: 21 mmol/L (ref 18–29)
CREATININE: 1.08 mg/dL — AB (ref 0.57–1.00)
GFR calc Af Amer: 59 mL/min/{1.73_m2} — ABNORMAL LOW (ref >59)
GFR calc non Af Amer: 51 mL/min/{1.73_m2} — ABNORMAL LOW (ref >59)
Glucose: 139 mg/dL — ABNORMAL HIGH (ref 65–99)
PHOSPHORUS: 3.5 mg/dL (ref 2.5–4.5)
Potassium: 5 mmol/L (ref 3.5–5.2)
Sodium: 139 mmol/L (ref 134–144)

## 2015-12-02 ENCOUNTER — Telehealth: Payer: Self-pay

## 2015-12-02 NOTE — Telephone Encounter (Signed)
-----   Message from Mar Daring, [place] sent at 12/02/2015 12:22 PM EDT ----- Kidney function is improving. Continue to stay well hydrated.

## 2015-12-02 NOTE — Telephone Encounter (Signed)
LMTCB  Thanks,  -Paulino Cork 

## 2015-12-06 NOTE — Telephone Encounter (Signed)
Yes ok to increase to 40mg , but she should also increase potassium to twice daily as well.

## 2015-12-06 NOTE — Telephone Encounter (Signed)
Patient advised as directed below.   Patient reports that her legs and ankles are swollen. She wants to know if she can go back to 40 MG on her Furosemide. She is currently taking only 20 Mg.  Please advise.  Thanks,  -Donna Malone

## 2015-12-07 NOTE — Telephone Encounter (Signed)
Pt advised. Mikaiah Stoffer Drozdowski, CMA  

## 2015-12-07 NOTE — Telephone Encounter (Signed)
lmtcb Emily Drozdowski, CMA  

## 2015-12-27 ENCOUNTER — Telehealth: Payer: Self-pay | Admitting: Physician Assistant

## 2015-12-27 DIAGNOSIS — M109 Gout, unspecified: Secondary | ICD-10-CM

## 2015-12-27 MED ORDER — COLCHICINE 0.6 MG PO TABS: 0.6000 mg | ORAL_TABLET | Freq: Two times a day (BID) | ORAL | 2 refills | Status: DC

## 2015-12-27 NOTE — Telephone Encounter (Signed)
Pt states she is having a lot of pain in her left foot.  Pt states she can not put any weight on this foot.  Pt thinks this is gout.  Pt is requesting a Rx for allopurinol (ZYLOPRIM) 100 MG tablet and something to help with the pain.  Pt is asking what she can eat to help with this.  [facility].  HE#[phone]/EH

## 2015-12-27 NOTE — Telephone Encounter (Signed)
Patient advised as below. Patient verbalizes understanding and is in agreement with treatment plan.  

## 2015-12-27 NOTE — Telephone Encounter (Signed)
Allopurinol 100mg  Rx has refills so she should be able to request that from her pharmacy. I have sent in new Rx for colchicine. This is the one she takes when she has an acute flare. She take 2 pills per day until pain subsides. Then she will stop that one and continue allopurinol. Cherry juice can help. Avoid red meats and alcohol. Call back and schedule appt if no improvement.

## 2015-12-28 ENCOUNTER — Other Ambulatory Visit: Payer: Self-pay | Admitting: Physician Assistant

## 2015-12-28 DIAGNOSIS — M109 Gout, unspecified: Secondary | ICD-10-CM

## 2015-12-28 MED ORDER — COLCHICINE 0.6 MG PO TABS: 0.6000 mg | ORAL_TABLET | Freq: Two times a day (BID) | ORAL | 2 refills | Status: DC

## 2016-01-23 ENCOUNTER — Other Ambulatory Visit: Payer: Self-pay | Admitting: Family Medicine

## 2016-01-23 DIAGNOSIS — R609 Edema, unspecified: Secondary | ICD-10-CM

## 2016-02-05 ENCOUNTER — Other Ambulatory Visit: Payer: Self-pay | Admitting: Family Medicine

## 2016-02-05 DIAGNOSIS — E559 Vitamin D deficiency, unspecified: Secondary | ICD-10-CM

## 2016-03-01 DIAGNOSIS — E113393 Type 2 diabetes mellitus with moderate nonproliferative diabetic retinopathy without macular edema, bilateral: Secondary | ICD-10-CM | POA: Diagnosis not present

## 2016-03-19 DIAGNOSIS — Z1231 Encounter for screening mammogram for malignant neoplasm of breast: Secondary | ICD-10-CM | POA: Diagnosis not present

## 2016-03-20 ENCOUNTER — Encounter: Payer: Self-pay | Admitting: Physician Assistant

## 2016-03-20 ENCOUNTER — Ambulatory Visit (INDEPENDENT_AMBULATORY_CARE_PROVIDER_SITE_OTHER): Payer: PPO

## 2016-03-20 VITALS — BP 136/78 | HR 72 | Temp 98.2°F | Ht 64.0 in | Wt 212.1 lb

## 2016-03-20 DIAGNOSIS — Z23 Encounter for immunization: Secondary | ICD-10-CM

## 2016-03-20 DIAGNOSIS — Z0000 Encounter for general adult medical examination without abnormal findings: Secondary | ICD-10-CM

## 2016-03-20 DIAGNOSIS — Z13820 Encounter for screening for osteoporosis: Secondary | ICD-10-CM

## 2016-03-20 DIAGNOSIS — Z1159 Encounter for screening for other viral diseases: Secondary | ICD-10-CM

## 2016-03-20 NOTE — Patient Instructions (Signed)
Donna Malone , Thank you for taking time to come for your Medicare Wellness Visit. I appreciate your ongoing commitment to your health goals. Please review the following plan we discussed and let me know if I can assist you in the future.   These are the goals we discussed: Goals    . Exercise 150 minutes per week (moderate activity)    . Increase water intake          Starting 03/20/16, I will increase my water intake to 6 glasses a day.    . Peak Blood Glucose < 180       This is a list of the screening recommended for you and due dates:  Health Maintenance  Topic Date Due  .  Hepatitis C: One time screening is recommended by Center for Disease Control  (CDC) for  adults born from [birth year] through 1967.   [birth year]  . Eye exam for diabetics  72/21/1957  . Colon Cancer Screening  04/22/1993  . Shingles Vaccine  04/23/2003  . DEXA scan (bone density measurement)  04/22/2008  . Flu Shot  11/01/2015  . Complete foot exam   11/08/2015  . Hemoglobin A1C  05/12/2016  . Mammogram  03/19/2017  . Tetanus Vaccine  04/09/2018  . Pneumonia vaccines  Completed   Preventive Care for Adults  A healthy lifestyle and preventive care can promote health and wellness. Preventive health guidelines for adults include the following key practices.  . A routine yearly physical is a good way to check with your health care provider about your health and preventive screening. It is a chance to share any concerns and updates on your health and to receive a thorough exam.  . Visit your dentist for a routine exam and preventive care every 6 months. Brush your teeth twice a day and floss once a day. Good oral hygiene prevents tooth decay and gum disease.  . The frequency of eye exams is based on your age, health, family medical history, use  of contact lenses, and other factors. Follow your health care provider's ecommendations for frequency of eye exams.  . Eat a healthy diet. Foods like vegetables, fruits,  whole grains, low-fat dairy products, and lean protein foods contain the nutrients you need without too many calories. Decrease your intake of foods high in solid fats, added sugars, and salt. Eat the right amount of calories for you. Get information about a proper diet from your health care provider, if necessary.  . Regular physical exercise is one of the most important things you can do for your health. Most adults should get at least 150 minutes of moderate-intensity exercise (any activity that increases your heart rate and causes you to sweat) each week. In addition, most adults need muscle-strengthening exercises on 2 or more days a week.  Silver Sneakers may be a benefit available to you. To determine eligibility, you may visit the website: www.silversneakers.com or contact program at 228-092-3076 Mon-Fri between 8AM-8PM.   . Maintain a healthy weight. The body mass index (BMI) is a screening tool to identify possible weight problems. It provides an estimate of body fat based on height and weight. Your health care provider can find your BMI and can help you achieve or maintain a healthy weight.   For adults 72 years and older: ? A BMI below 18.5 is considered underweight. ? A BMI of 18.5 to 24.9 is normal. ? A BMI of 25 to 29.9 is considered overweight. ? A BMI of 30  and above is considered obese.   . Maintain normal blood lipids and cholesterol levels by exercising and minimizing your intake of saturated fat. Eat a balanced diet with plenty of fruit and vegetables. Blood tests for lipids and cholesterol should begin at age 3 and be repeated every 5 years. If your lipid or cholesterol levels are high, you are over 50, or you are at high risk for heart disease, you may need your cholesterol levels checked more frequently. Ongoing high lipid and cholesterol levels should be treated with medicines if diet and exercise are not working.  . If you smoke, find out from your health care provider  how to quit. If you do not use tobacco, please do not start.  . If you choose to drink alcohol, please do not consume more than 2 drinks per day. One drink is considered to be 12 ounces (355 mL) of beer, 5 ounces (148 mL) of wine, or 1.5 ounces (44 mL) of liquor.  . If you are 72-72 years old, ask your health care provider if you should take aspirin to prevent strokes.  . Use sunscreen. Apply sunscreen liberally and repeatedly throughout the day. You should seek shade when your shadow is shorter than you. Protect yourself by wearing long sleeves, pants, a wide-brimmed hat, and sunglasses year round, whenever you are outdoors.  . Once a month, do a whole body skin exam, using a mirror to look at the skin on your back. Tell your health care provider of new moles, moles that have irregular borders, moles that are larger than a pencil eraser, or moles that have changed in shape or color.

## 2016-03-20 NOTE — Progress Notes (Signed)
Subjective:   Donna Malone is a 70 y.o. female who presents for Medicare Annual (Subsequent) preventive examination.  Review of Systems:  N/A  Cardiac Risk Factors include: advanced age (>23men, >74 women);diabetes mellitus;hypertension;dyslipidemia;obesity (BMI >30kg/m2)     Objective:     Vitals: BP 136/78 (BP Location: Right Arm)   Pulse 72   Temp 98.2 F (36.8 C) (Oral)   Ht 5\' 4"  (1.626 m)   Wt 212 lb 2 oz (96.2 kg)   BMI 36.41 kg/m   Body mass index is 36.41 kg/m.   Tobacco History  Smoking Status  . Never Smoker  Smokeless Tobacco  . Never Used     Counseling given: Not Answered   Past Medical History:  Diagnosis Date  . Asthma   . Bone spur    heel  . Depression   . Diabetes mellitus without complication ([place])   . GERD (gastroesophageal reflux disease)   . Gout   . Hypertension   . Thyroid disease    Past Surgical History:  Procedure Laterality Date  . ABDOMINAL HYSTERECTOMY  04/1970   partial  . CHOLECYSTECTOMY  late 1990's   Family History  Problem Relation Age of Onset  . CAD Mother   . Heart attack Mother   . Heart disease Father   . Diabetes Cousin    History  Sexual Activity  . Sexual activity: Not on file    Outpatient Encounter Prescriptions as of 03/20/2016  Medication Sig  . acetaminophen (TYLENOL) 500 MG tablet Take 1,000 mg by mouth every 8 (eight) hours as needed.  Marland Kitchen allopurinol (ZYLOPRIM) 100 MG tablet Take 1 tablet (100 mg total) by mouth daily.  Marland Kitchen aspirin 81 MG tablet Take 81 mg by mouth daily.   Marland Kitchen atorvastatin (LIPITOR) 40 MG tablet Take 1 tablet by mouth  daily  . colchicine 0.6 MG tablet Take 1 tablet (0.6 mg total) by mouth 2 (two) times daily.  Marland Kitchen escitalopram (LEXAPRO) 20 MG tablet Take 1 tablet (20 mg total) by mouth daily.  . furosemide (LASIX) 40 MG tablet Take 1 tablet by mouth daily  . glipiZIDE (GLUCOTROL XL) 5 MG 24 hr tablet Take 1 tablet (5 mg total) by mouth daily with breakfast.  . levothyroxine  (SYNTHROID, LEVOTHROID) 75 MCG tablet Take 1 tablet by mouth  daily  . lisinopril (PRINIVIL,ZESTRIL) 5 MG tablet Take 1 tablet by mouth every other day  . metFORMIN (GLUCOPHAGE) 1000 MG tablet Take 1 tablet by mouth two  times daily  . Omega 3 340 MG CPDR Take 1 capsule by mouth daily.   . ONE TOUCH ULTRA TEST test strip TEST BLOOD SUGAR ONCE DAILY  . potassium chloride (MICRO-K) 10 MEQ CR capsule Take 1 capsule by mouth  daily  . ranitidine (ZANTAC) 150 MG tablet Take 1 tablet (150 mg total) by mouth 2 (two) times daily.  . traZODone (DESYREL) 100 MG tablet Take 1 tablet by mouth at  bedtime  . Vitamin D, Ergocalciferol, (DRISDOL) 50000 units CAPS capsule TAKE ONE CAPSULE BY MOUTH ONCE A WEEK   No facility-administered encounter medications on file as of 03/20/2016.     Activities of Daily Living In your present state of health, do you have any difficulty performing the following activities: 03/20/2016  Hearing? N  Vision? N  Difficulty concentrating or making decisions? N  Walking or climbing stairs? Y  Dressing or bathing? N  Doing errands, shopping? N  Preparing Food and eating ? N  Using the  Toilet? N  In the past six months, have you accidently leaked urine? N  Do you have problems with loss of bowel control? N  Managing your Medications? N  Managing your Finances? N  Housekeeping or managing your Housekeeping? N  Some recent data might be hidden    Patient Care Team: Mar Daring, PA-C as PCP - General (Family Medicine) Crista Curb. Gloriann Loan, MD as Consulting Physician (Ophthalmology)    Assessment:     Exercise Activities and Dietary recommendations Current Exercise Habits: The patient does not participate in regular exercise at present, Exercise limited by: orthopedic condition(s)  Goals    . Exercise 150 minutes per week (moderate activity)    . Increase water intake          Starting 03/20/16, I will increase my water intake to 6 glasses a day.    . Peak  Blood Glucose < 180      Fall Risk Fall Risk  03/20/2016 11/08/2014  Falls in the past year? No No   Depression Screen PHQ 2/9 Scores 03/20/2016 11/08/2014  PHQ - 2 Score 1 0     Cognitive Function     6CIT Screen 03/20/2016  What Year? 0 points  What month? 0 points  What time? 0 points  Count back from 20 0 points  Months in reverse 0 points  Repeat phrase 2 points  Total Score 2    Immunization History  Administered Date(s) Administered  . Influenza, High Dose Seasonal PF 02/09/2015, 03/20/2016  . Pneumococcal Conjugate-13 12/03/2013  . Pneumococcal Polysaccharide-23 03/21/2011  . Tdap 04/09/2008   Screening Tests Health Maintenance  Topic Date Due  . FOOT EXAM  04/01/2016 (Originally 11/08/2015)  . DEXA SCAN  04/01/2016 (Originally 04/22/2008)  . ZOSTAVAX  04/01/2016 (Originally 04/23/2003)  . COLONOSCOPY  04/02/2017 (Originally 04/22/1993)  . HEMOGLOBIN A1C  05/12/2016  . OPHTHALMOLOGY EXAM  03/01/2017  . MAMMOGRAM  03/19/2017  . TETANUS/TDAP  04/09/2018  . INFLUENZA VACCINE  Completed  . Hepatitis C Screening  Completed  . PNA vac Low Risk Adult  Completed      Plan:  I have personally reviewed and addressed the Medicare Annual Wellness questionnaire and have noted the following in the patient's chart:  A. Medical and social history B. Use of alcohol, tobacco or illicit drugs  C. Current medications and supplements D. Functional ability and status E.  Nutritional status F.  Physical activity G. Advance directives H. List of other physicians I.  Hospitalizations, surgeries, and ER visits in previous 12 months J.  [place] such as hearing and vision if needed, cognitive and depression L. Referrals and appointments - none  In addition, I have reviewed and discussed with patient certain preventive protocols, quality metrics, and best practice recommendations. A written personalized care plan for preventive services as well as general preventive  health recommendations were provided to patient.  See attached scanned questionnaire for additional information.   Signed,  Fabio Neighbors, LPN Nurse Health Advisor   MD Recommendations: Followup on Zostavax and Bone density results. Pt denied vaccine today.  I have reviewed the documentation and information obtained by Fabio Neighbors, LPN in the above chart and agree as above. I was available for consultation if any questions or issues arose.  [name], PA-C

## 2016-03-21 ENCOUNTER — Telehealth: Payer: Self-pay

## 2016-03-21 LAB — HEPATITIS C ANTIBODY: Hep C Virus Ab: <0.1 s/co ratio (ref 0.0–0.9)

## 2016-03-21 NOTE — Telephone Encounter (Signed)
Advised pt of lab results. Pt verbally acknowledges understanding. Darran Gabay Drozdowski, CMA   

## 2016-03-21 NOTE — Telephone Encounter (Signed)
-----   Message from Mar Daring, PA-C sent at 03/21/2016  8:16 AM EST ----- Hep C negative

## 2016-05-02 ENCOUNTER — Other Ambulatory Visit: Payer: Self-pay | Admitting: Family Medicine

## 2016-05-02 DIAGNOSIS — E119 Type 2 diabetes mellitus without complications: Secondary | ICD-10-CM

## 2016-05-02 DIAGNOSIS — E038 Other specified hypothyroidism: Secondary | ICD-10-CM

## 2016-05-02 NOTE — Telephone Encounter (Signed)
Please review-aa 

## 2016-05-08 ENCOUNTER — Ambulatory Visit
Admission: RE | Admit: 2016-05-08 | Discharge: 2016-05-08 | Disposition: A | Discharge status: Home / Self Care | Payer: PPO | Source: Ambulatory Visit | Attending: Physician Assistant | Admitting: Physician Assistant

## 2016-05-08 DIAGNOSIS — Z780 Asymptomatic menopausal state: Secondary | ICD-10-CM | POA: Insufficient documentation

## 2016-05-08 DIAGNOSIS — Z7982 Long term (current) use of aspirin: Secondary | ICD-10-CM | POA: Insufficient documentation

## 2016-05-08 DIAGNOSIS — M85851 Other specified disorders of bone density and structure, right thigh: Secondary | ICD-10-CM | POA: Insufficient documentation

## 2016-05-08 DIAGNOSIS — M109 Gout, unspecified: Secondary | ICD-10-CM | POA: Insufficient documentation

## 2016-05-08 DIAGNOSIS — Z7984 Long term (current) use of oral hypoglycemic drugs: Secondary | ICD-10-CM | POA: Diagnosis not present

## 2016-05-08 DIAGNOSIS — Z13820 Encounter for screening for osteoporosis: Secondary | ICD-10-CM

## 2016-05-08 DIAGNOSIS — Z79899 Other long term (current) drug therapy: Secondary | ICD-10-CM | POA: Insufficient documentation

## 2016-05-08 DIAGNOSIS — E119 Type 2 diabetes mellitus without complications: Secondary | ICD-10-CM | POA: Diagnosis not present

## 2016-05-22 ENCOUNTER — Telehealth: Payer: Self-pay | Admitting: Physician Assistant

## 2016-05-22 DIAGNOSIS — F3342 Major depressive disorder, recurrent, in full remission: Secondary | ICD-10-CM

## 2016-05-22 DIAGNOSIS — E119 Type 2 diabetes mellitus without complications: Secondary | ICD-10-CM

## 2016-05-22 DIAGNOSIS — F5101 Primary insomnia: Secondary | ICD-10-CM

## 2016-05-22 MED ORDER — GLIPIZIDE ER 5 MG PO TB24: 5.0000 mg | ORAL_TABLET | Freq: Every day | ORAL | 3 refills | Status: DC

## 2016-05-22 MED ORDER — ESCITALOPRAM OXALATE 20 MG PO TABS: 20.0000 mg | ORAL_TABLET | Freq: Every day | ORAL | 3 refills | Status: DC

## 2016-05-22 MED ORDER — TRAZODONE HCL 100 MG PO TABS: ORAL_TABLET | ORAL | 3 refills | Status: DC

## 2016-05-22 NOTE — Telephone Encounter (Signed)
Refill request for escitalopram, trazodone and glipizide received from [facility]

## 2016-05-23 ENCOUNTER — Ambulatory Visit (INDEPENDENT_AMBULATORY_CARE_PROVIDER_SITE_OTHER): Payer: PPO | Admitting: Physician Assistant

## 2016-05-23 ENCOUNTER — Encounter: Payer: Self-pay | Admitting: Physician Assistant

## 2016-05-23 VITALS — BP 110/74 | HR 74 | Temp 98.1°F | Resp 16 | Wt 209.0 lb

## 2016-05-23 DIAGNOSIS — I10 Essential (primary) hypertension: Secondary | ICD-10-CM | POA: Diagnosis not present

## 2016-05-23 DIAGNOSIS — M109 Gout, unspecified: Secondary | ICD-10-CM | POA: Diagnosis not present

## 2016-05-23 DIAGNOSIS — E119 Type 2 diabetes mellitus without complications: Secondary | ICD-10-CM | POA: Diagnosis not present

## 2016-05-23 DIAGNOSIS — E7800 Pure hypercholesterolemia, unspecified: Secondary | ICD-10-CM | POA: Diagnosis not present

## 2016-05-23 LAB — POCT GLYCOSYLATED HEMOGLOBIN (HGB A1C)
ESTIMATED AVERAGE GLUCOSE: 137
HEMOGLOBIN A1C: 6.4

## 2016-05-23 MED ORDER — COLCHICINE 0.6 MG PO TABS: 0.6000 mg | ORAL_TABLET | Freq: Two times a day (BID) | ORAL | 1 refills | Status: DC

## 2016-05-23 NOTE — Progress Notes (Signed)
Patient: Donna Malone Female    DOB: [birth year]   71 y.o.   MRN: [id number] Visit Date: 05/23/2016  Today's Provider: Mar Daring, PA-C   Chief Complaint  Patient presents with  . Diabetes  . Hypertension  . Hyperlipidemia  . Hypothyroidism   Subjective:    HPI  Diabetes Mellitus Type II, Follow-up:   Lab Results  Component Value Date   HGBA1C 6.4 05/23/2016   HGBA1C 6.2 (H) 11/10/2015   HGBA1C 6.3 06/13/2015   Last seen for diabetes 6 months ago.  Management since then includes no changes. She reports excellent compliance with treatment. She is not having side effects.  Current symptoms include none and have been stable. Home blood sugar records: fasting range: 120's  Episodes of hypoglycemia? no   Most Recent Eye Exam: UTD Weight trend: stable Prior visit with dietician: no Current diet: in general, a "healthy" diet   Current exercise: housecleaning and walking  ------------------------------------------------------------------------   Hypertension, follow-up:  BP Readings from Last 3 Encounters:  05/23/16 110/74  03/20/16 136/78  11/10/15 122/78    She was last seen for hypertension 6 months ago.  BP at that visit was 136/78. Management since that visit includes no changes.She reports excellent compliance with treatment. She is not having side effects.  She is exercising. She is adherent to low salt diet.   Outside blood pressures are stable. She is experiencing none.  Patient denies chest pain and lower extremity edema.   Cardiovascular risk factors include advanced age (older than 42 for men, 6 for women), diabetes mellitus, hypertension and obesity (BMI >= 30 kg/m2).  Use of agents associated with hypertension: none.   ------------------------------------------------------------------------    Lipid/Cholesterol, Follow-up:   Last seen for this 6 months ago.  Management since that visit includes no changes.  Last  Lipid Panel:    Component Value Date/Time   CHOL 161 11/10/2015 1047   TRIG 397 (H) 11/10/2015 1047   HDL 34 (L) 11/10/2015 1047   CHOLHDL 4.7 (H) 11/10/2015 1047   LDLCALC 48 11/10/2015 1047    She reports excellent compliance with treatment. She is not having side effects.   Wt Readings from Last 3 Encounters:  05/23/16 209 lb (94.8 kg)  03/20/16 212 lb 2 oz (96.2 kg)  10/29/15 210 lb (95.3 kg)  ------------------------------------------------------------------------   Hypothyroid, follow-up:  TSH  Date Value Ref Range Status  11/10/2015 1.140 0.450 - 4.500 uIU/mL Final  11/08/2014 0.957 0.450 - 4.500 uIU/mL Final   Wt Readings from Last 3 Encounters:  05/23/16 209 lb (94.8 kg)  03/20/16 212 lb 2 oz (96.2 kg)  10/29/15 210 lb (95.3 kg)    She was last seen for hypothyroid 6 months ago.  Management since that visit includes no changes. She reports excellent compliance with treatment. She is not having side effects.  She is exercising. She is experiencing none She denies change in energy level Weight trend: stable  ------------------------------------------------------------------------     Allergies  Allergen Reactions  . Sulfa Antibiotics      Current Outpatient Prescriptions:  .  acetaminophen (TYLENOL) 500 MG tablet, Take 1,000 mg by mouth every 8 (eight) hours as needed., Disp: , Rfl:  .  allopurinol (ZYLOPRIM) 100 MG tablet, Take 1 tablet (100 mg total) by mouth daily., Disp: 30 tablet, Rfl: 6 .  aspirin 81 MG tablet, Take 81 mg by mouth daily. , Disp: , Rfl:  .  atorvastatin (LIPITOR) 40 MG tablet,  Take 1 tablet by mouth  daily, Disp: 90 tablet, Rfl: 3 .  colchicine 0.6 MG tablet, Take 1 tablet (0.6 mg total) by mouth 2 (two) times daily., Disp: 60 tablet, Rfl: 2 .  escitalopram (LEXAPRO) 20 MG tablet, Take 1 tablet (20 mg total) by mouth daily., Disp: 90 tablet, Rfl: 3 .  furosemide (LASIX) 40 MG tablet, Take 1 tablet by mouth daily, Disp: 90 tablet,  Rfl: 1 .  glipiZIDE (GLUCOTROL XL) 5 MG 24 hr tablet, Take 1 tablet (5 mg total) by mouth daily with breakfast., Disp: 90 tablet, Rfl: 3 .  levothyroxine (SYNTHROID, LEVOTHROID) 75 MCG tablet, Take 1 tablet by mouth daily, Disp: 90 tablet, Rfl: 2 .  lisinopril (PRINIVIL,ZESTRIL) 5 MG tablet, Take 1 tablet by mouth every other day, Disp: 90 tablet, Rfl: 3 .  metFORMIN (GLUCOPHAGE) 1000 MG tablet, Take 1 tablet by mouth twice a day, Disp: 180 tablet, Rfl: 2 .  Omega 3 340 MG CPDR, Take 1 capsule by mouth daily. , Disp: , Rfl:  .  ONE TOUCH ULTRA TEST test strip, TEST BLOOD SUGAR ONCE DAILY, Disp: 100 each, Rfl: 5 .  potassium chloride (MICRO-K) 10 MEQ CR capsule, Take 1 capsule by mouth  daily, Disp: 90 capsule, Rfl: 3 .  ranitidine (ZANTAC) 150 MG tablet, Take 1 tablet (150 mg total) by mouth 2 (two) times daily., Disp: 180 tablet, Rfl: 3 .  traZODone (DESYREL) 100 MG tablet, Take 1 tablet by mouth at  bedtime, Disp: 90 tablet, Rfl: 3 .  Vitamin D, Ergocalciferol, (DRISDOL) 50000 units CAPS capsule, TAKE ONE CAPSULE BY MOUTH ONCE A WEEK, Disp: 12 capsule, Rfl: 1  Review of Systems  Constitutional: Negative.   Respiratory: Negative.   Cardiovascular: Negative.   Endocrine: Negative.   Musculoskeletal: Positive for arthralgias (right collar pain).    Social History  Substance Use Topics  . Smoking status: Never Smoker  . Smokeless tobacco: Never Used  . Alcohol use No   Objective:   BP 110/74 (BP Location: Left Arm, Patient Position: Sitting, Cuff Size: Large)   Pulse 74   Temp 98.1 F (36.7 C) (Oral)   Resp 16   Wt 209 lb (94.8 kg)   SpO2 96%   BMI 35.87 kg/m   Physical Exam  Constitutional: She appears well-developed and well-nourished. No distress.  Neck: Normal range of motion. Neck supple. No tracheal deviation present. No thyromegaly present.  Cardiovascular: Normal rate, regular rhythm, normal heart sounds and intact distal pulses.  Exam reveals no gallop and no friction  rub.   No murmur heard. Pulmonary/Chest: Effort normal and breath sounds normal. No respiratory distress. She has no wheezes. She has no rales.  Musculoskeletal: She exhibits no edema.  Lymphadenopathy:    She has no cervical adenopathy.  Skin: She is not diaphoretic.  Vitals reviewed.  Diabetic Foot Form - Detailed   Diabetic Foot Exam - detailed Diabetic Foot exam was performed with the following findings:  Yes 05/23/2016  9:00 AM  Visual Foot Exam completed.:  Yes  Is there a history of foot ulcer?:  No Can the patient see the bottom of their feet?:  Yes Are the shoes appropriate in style and fit?:  Yes Is there swelling or and abnormal foot shape?:  No Are the toenails long?:  No Are the toenails thick?:  No Do you have pain in calf while walking?:  No Is there a claw toe deformity?:  No Is there elevated skin temparature?:  No Is  there limited skin dorsiflexion?:  No Is there foot or ankle muscle weakness?:  No Are the toenails ingrown?:  No Normal Range of Motion:  Yes Pulse Foot Exam completed.:  Yes  Right posterior Tibialias:  Present Left posterior Tibialias:  Present  Right Dorsalis Pedis:  Present Left Dorsalis Pedis:  Present  Sensory Foot Exam Completed.:  Yes Swelling:  No Semmes-Weinstein Monofilament Test R Foot Test Control:  Neg L Foot Test Control:  Neg  R Site 1-Great Toe:  Neg L Site 1-Great Toe:  Neg  R Site 4:  Neg L Site 4:  Neg  R Site 5:  Neg L Site 5:  Neg    Comments:  Normal exam        Assessment & Plan:     1. Essential hypertension Stable. Continue lisinopril 5mg . I will see her back in 6 months to recheck all labs.  2. Type 2 diabetes mellitus without complication, without long-term current use of insulin (HCC) Increased slightly from 6.2 to 6.4. Continue metformin 1000mg  bid, glipizide 10mg  daily. Foot exam normal today. We will recheck in 6 months. - POCT HgB A1C  3. Hypercholesteremia Stable. Continue atorvastatin 40mg . Will  check labs in 6 months.   4. Acute gout of left foot, unspecified cause Stable. Diagnosis pulled for medication refill. Continue current medical treatment plan. - colchicine 0.6 MG tablet; Take 1 tablet (0.6 mg total) by mouth 2 (two) times daily.  Dispense: 180 tablet; Refill: [place], PA-C  [facility]

## 2016-05-23 NOTE — Patient Instructions (Signed)
Carbohydrate Counting for Diabetes Mellitus, Adult Carbohydrate counting is a method for keeping track of how many carbohydrates you eat. Eating carbohydrates naturally increases the amount of sugar (glucose) in the blood. Counting how many carbohydrates you eat helps keep your blood glucose within normal limits, which helps you manage your diabetes (diabetes mellitus). It is important to know how many carbohydrates you can safely have in each meal. This is different for every person. A diet and nutrition specialist (registered dietitian) can help you make a meal plan and calculate how many carbohydrates you should have at each meal and snack. Carbohydrates are found in the following foods:  Grains, such as breads and cereals.  Dried beans and soy products.  Starchy vegetables, such as potatoes, peas, and corn.  Fruit and fruit juices.  Milk and yogurt.  Sweets and snack foods, such as cake, cookies, candy, chips, and soft drinks. How do I count carbohydrates? There are two ways to count carbohydrates in food. You can use either of the methods or a combination of both. Reading "Nutrition Facts" on packaged food  The "Nutrition Facts" list is included on the labels of almost all packaged foods and beverages in the [place] It includes:  The serving size.  Information about nutrients in each serving, including the grams (g) of carbohydrate per serving. To use the "Nutrition Facts":  Decide how many servings you will have.  Multiply the number of servings by the number of carbohydrates per serving.  The resulting number is the total amount of carbohydrates that you will be having. Learning standard serving sizes of other foods  When you eat foods containing carbohydrates that are not packaged or do not include "Nutrition Facts" on the label, you need to measure the servings in order to count the amount of carbohydrates:  Measure the foods that you will eat with a food scale or measuring  cup, if needed.  Decide how many standard-size servings you will eat.  Multiply the number of servings by 15. Most carbohydrate-rich foods have about 15 g of carbohydrates per serving.  For example, if you eat 8 oz (170 g) of strawberries, you will have eaten 2 servings and 30 g of carbohydrates (2 servings x 15 g = 30 g).  For foods that have more than one food mixed, such as soups and casseroles, you must count the carbohydrates in each food that is included. The following list contains standard serving sizes of common carbohydrate-rich foods. Each of these servings has about 15 g of carbohydrates:   hamburger bun or  English muffin.   oz (15 mL) syrup.   oz (14 g) jelly.  1 slice of bread.  1 six-inch tortilla.  3 oz (85 g) cooked rice or pasta.  4 oz (113 g) cooked dried beans.  4 oz (113 g) starchy vegetable, such as peas, corn, or potatoes.  4 oz (113 g) hot cereal.  4 oz (113 g) mashed potatoes or  of a large baked potato.  4 oz (113 g) canned or frozen fruit.  4 oz (120 mL) fruit juice.  4-6 crackers.  6 chicken nuggets.  6 oz (170 g) unsweetened dry cereal.  6 oz (170 g) plain fat-free yogurt or yogurt sweetened with artificial sweeteners.  8 oz (240 mL) milk.  8 oz (170 g) fresh fruit or one small piece of fruit.  24 oz (680 g) popped popcorn. Example of carbohydrate counting Sample meal  3 oz (85 g) chicken breast.  6 oz (  170 g) brown rice.  4 oz (113 g) corn.  8 oz (240 mL) milk.  8 oz (170 g) strawberries with sugar-free whipped topping. Carbohydrate calculation 1. Identify the foods that contain carbohydrates:  Rice.  Corn.  Milk.  Strawberries. 2. Calculate how many servings you have of each food:  2 servings rice.  1 serving corn.  1 serving milk.  1 serving strawberries. 3. Multiply each number of servings by 15 g:  2 servings rice x 15 g = 30 g.  1 serving corn x 15 g = 15 g.  1 serving milk x 15 g = 15  g.  1 serving strawberries x 15 g = 15 g. 4. Add together all of the amounts to find the total grams of carbohydrates eaten:  30 g + 15 g + 15 g + 15 g = 75 g of carbohydrates total. This information is not intended to replace advice given to you by your health care provider. Make sure you discuss any questions you have with your health care provider. Document Released: 03/19/2005 Document Revised: 10/07/2015 Document Reviewed: 08/31/2015 Elsevier Interactive Patient Education  2017 [employer]  

## 2016-05-28 ENCOUNTER — Other Ambulatory Visit: Payer: Self-pay | Admitting: Emergency Medicine

## 2016-05-28 DIAGNOSIS — F5101 Primary insomnia: Secondary | ICD-10-CM

## 2016-05-28 DIAGNOSIS — E119 Type 2 diabetes mellitus without complications: Secondary | ICD-10-CM

## 2016-05-28 DIAGNOSIS — M10471 Other secondary gout, right ankle and foot: Secondary | ICD-10-CM

## 2016-05-28 MED ORDER — TRAZODONE HCL 100 MG PO TABS: ORAL_TABLET | ORAL | 3 refills | Status: DC

## 2016-05-28 MED ORDER — ALLOPURINOL 100 MG PO TABS: 100.0000 mg | ORAL_TABLET | Freq: Every day | ORAL | 3 refills | Status: DC

## 2016-05-28 MED ORDER — GLIPIZIDE ER 5 MG PO TB24: 5.0000 mg | ORAL_TABLET | Freq: Every day | ORAL | 3 refills | Status: DC

## 2016-07-09 ENCOUNTER — Telehealth: Payer: Self-pay | Admitting: Physician Assistant

## 2016-07-09 ENCOUNTER — Other Ambulatory Visit: Payer: Self-pay | Admitting: Physician Assistant

## 2016-07-09 DIAGNOSIS — M10471 Other secondary gout, right ankle and foot: Secondary | ICD-10-CM

## 2016-07-09 DIAGNOSIS — K219 Gastro-esophageal reflux disease without esophagitis: Secondary | ICD-10-CM

## 2016-07-09 DIAGNOSIS — E7800 Pure hypercholesterolemia, unspecified: Secondary | ICD-10-CM

## 2016-07-09 DIAGNOSIS — I10 Essential (primary) hypertension: Secondary | ICD-10-CM

## 2016-07-09 DIAGNOSIS — E559 Vitamin D deficiency, unspecified: Secondary | ICD-10-CM

## 2016-07-09 DIAGNOSIS — R609 Edema, unspecified: Secondary | ICD-10-CM

## 2016-07-09 MED ORDER — LISINOPRIL 5 MG PO TABS: ORAL_TABLET | ORAL | 3 refills | Status: DC

## 2016-07-09 MED ORDER — VITAMIN D (ERGOCALCIFEROL) 1.25 MG (50000 UNIT) PO CAPS: 50000.0000 [IU] | ORAL_CAPSULE | ORAL | 1 refills | Status: DC

## 2016-07-09 MED ORDER — RANITIDINE HCL 150 MG PO TABS: 150.0000 mg | ORAL_TABLET | Freq: Two times a day (BID) | ORAL | 3 refills | Status: DC

## 2016-07-09 MED ORDER — FUROSEMIDE 40 MG PO TABS: 40.0000 mg | ORAL_TABLET | Freq: Every day | ORAL | 3 refills | Status: DC

## 2016-07-09 MED ORDER — ATORVASTATIN CALCIUM 40 MG PO TABS: ORAL_TABLET | ORAL | 3 refills | Status: DC

## 2016-07-09 MED ORDER — POTASSIUM CHLORIDE ER 10 MEQ PO CPCR: ORAL_CAPSULE | ORAL | 3 refills | Status: DC

## 2016-07-09 MED ORDER — ALLOPURINOL 100 MG PO TABS: 100.0000 mg | ORAL_TABLET | Freq: Every day | ORAL | 3 refills | Status: DC

## 2016-07-09 NOTE — Telephone Encounter (Signed)
Pt contacted office for refill request on the following medications: 90 day supply.  [facility] mail order.  JA#[phone]/[id number]  potassium chloride (MICRO-K) 10 MEQ CR capsule  Vitamin D, Ergocalciferol, (DRISDOL) 50000 units CAPS capsule  ranitidine (ZANTAC) 150 MG tablet  furosemide (LASIX) 40 MG tablet  atorvastatin (LIPITOR) 40 MG tablet  lisinopril (PRINIVIL,ZESTRIL) 5 MG tablet  allopurinol (ZYLOPRIM) 100 MG tablet

## 2016-07-09 NOTE — Telephone Encounter (Signed)
Pt said she vit D refill.  She said she dont think insurance will pay for it.  Thanks [employer]

## 2016-07-11 ENCOUNTER — Other Ambulatory Visit: Payer: Self-pay

## 2016-07-11 DIAGNOSIS — I10 Essential (primary) hypertension: Secondary | ICD-10-CM

## 2016-07-11 DIAGNOSIS — E7800 Pure hypercholesterolemia, unspecified: Secondary | ICD-10-CM

## 2016-07-11 MED ORDER — LISINOPRIL 5 MG PO TABS: ORAL_TABLET | ORAL | 3 refills | Status: DC

## 2016-07-11 MED ORDER — ATORVASTATIN CALCIUM 40 MG PO TABS: ORAL_TABLET | ORAL | 3 refills | Status: DC

## 2016-07-11 NOTE — Telephone Encounter (Signed)
Pharmacy requesting refill Atorvastatin and Lisinopril were sent to [facility] on 07/09/16. Please review. Thank you. sd

## 2016-07-12 ENCOUNTER — Other Ambulatory Visit: Payer: Self-pay | Admitting: Physician Assistant

## 2016-07-12 DIAGNOSIS — K219 Gastro-esophageal reflux disease without esophagitis: Secondary | ICD-10-CM

## 2016-07-12 DIAGNOSIS — R609 Edema, unspecified: Secondary | ICD-10-CM

## 2016-07-12 MED ORDER — RANITIDINE HCL 150 MG PO TABS: 150.0000 mg | ORAL_TABLET | Freq: Two times a day (BID) | ORAL | 3 refills | Status: DC

## 2016-07-12 MED ORDER — FUROSEMIDE 40 MG PO TABS: 40.0000 mg | ORAL_TABLET | Freq: Every day | ORAL | 3 refills | Status: DC

## 2016-07-12 NOTE — Telephone Encounter (Addendum)
[facility] pharmacy faxed a request for the following medications.  Thanks [facility]   furosemide (LASIX) 40 MG tablet  Take 1 tablet by mouth daily Qty:90  ranitidine (ZANTAC) 150 MG tablet  Take 1 tablet by mouth twice a day Qty:180

## 2016-07-16 ENCOUNTER — Telehealth: Payer: Self-pay | Admitting: Physician Assistant

## 2016-07-16 NOTE — Telephone Encounter (Signed)
Donna Malone with [facility] called saying they needs clarification on her medications.  Dosages and strengths.  Please advise Donna Malone at [phone]  Thanks [employer]

## 2016-07-16 NOTE — Telephone Encounter (Signed)
LM for Donna Malone with [facility] to call back and on which medications is she needing clarification  Thanks,   -Joseline

## 2016-07-16 NOTE — Telephone Encounter (Signed)
Melissa from [facility] calling to verify the instruction on the Lisinopril 5mg  per prescription it states "Take 1 tablet by mouth every other day" Per Melissa patient previous instruction was Take 1 tablet by mouth once a day" Verified with Tawanna Sat. Per Tawanna Sat patient should be taking by mouth daily and Melissa from [facility] was advised.

## 2016-10-19 DIAGNOSIS — D2261 Melanocytic nevi of right upper limb, including shoulder: Secondary | ICD-10-CM | POA: Diagnosis not present

## 2016-10-19 DIAGNOSIS — D225 Melanocytic nevi of trunk: Secondary | ICD-10-CM | POA: Diagnosis not present

## 2016-10-19 DIAGNOSIS — L728 Other follicular cysts of the skin and subcutaneous tissue: Secondary | ICD-10-CM | POA: Diagnosis not present

## 2016-10-19 DIAGNOSIS — L821 Other seborrheic keratosis: Secondary | ICD-10-CM | POA: Diagnosis not present

## 2016-11-20 ENCOUNTER — Encounter: Payer: Self-pay | Admitting: Physician Assistant

## 2016-11-20 ENCOUNTER — Ambulatory Visit (INDEPENDENT_AMBULATORY_CARE_PROVIDER_SITE_OTHER): Payer: PPO | Admitting: Physician Assistant

## 2016-11-20 VITALS — BP 106/60 | HR 84 | Temp 97.8°F | Resp 16 | Ht 64.0 in | Wt 197.0 lb

## 2016-11-20 DIAGNOSIS — Z1211 Encounter for screening for malignant neoplasm of colon: Secondary | ICD-10-CM | POA: Diagnosis not present

## 2016-11-20 DIAGNOSIS — E7800 Pure hypercholesterolemia, unspecified: Secondary | ICD-10-CM

## 2016-11-20 DIAGNOSIS — E039 Hypothyroidism, unspecified: Secondary | ICD-10-CM

## 2016-11-20 DIAGNOSIS — R5383 Other fatigue: Secondary | ICD-10-CM | POA: Diagnosis not present

## 2016-11-20 DIAGNOSIS — I130 Hypertensive heart and chronic kidney disease with heart failure and stage 1 through stage 4 chronic kidney disease, or unspecified chronic kidney disease: Secondary | ICD-10-CM | POA: Diagnosis not present

## 2016-11-20 DIAGNOSIS — N183 Chronic kidney disease, stage 3 unspecified: Secondary | ICD-10-CM

## 2016-11-20 DIAGNOSIS — E119 Type 2 diabetes mellitus without complications: Secondary | ICD-10-CM

## 2016-11-20 DIAGNOSIS — R197 Diarrhea, unspecified: Secondary | ICD-10-CM | POA: Diagnosis not present

## 2016-11-20 DIAGNOSIS — E559 Vitamin D deficiency, unspecified: Secondary | ICD-10-CM | POA: Diagnosis not present

## 2016-11-20 DIAGNOSIS — I10 Essential (primary) hypertension: Secondary | ICD-10-CM

## 2016-11-20 LAB — POCT GLYCOSYLATED HEMOGLOBIN (HGB A1C): Hemoglobin A1C: 6.1

## 2016-11-20 NOTE — Patient Instructions (Signed)
Irritable Bowel Syndrome, Adult Irritable bowel syndrome (IBS) is not one specific disease. It is a group of symptoms that affects the organs responsible for digestion (gastrointestinal or GI tract). To regulate how your GI tract works, your body sends signals back and forth between your intestines and your brain. If you have IBS, there may be a problem with these signals. As a result, your GI tract does not function normally. Your intestines may become more sensitive and overreact to certain things. This is especially true when you eat certain foods or when you are under stress. There are four types of IBS. These may be determined based on the consistency of your stool:  IBS with diarrhea.  IBS with constipation.  Mixed IBS.  Unsubtyped IBS.  It is important to know which type of IBS you have. Some treatments are more likely to be helpful for certain types of IBS. What are the causes? The exact cause of IBS is not known. What increases the risk? You may have a higher risk of IBS if:  You are a woman.  You are younger than 73 years old.  You have a family history of IBS.  You have mental health problems.  You have had bacterial infection of your GI tract.  What are the signs or symptoms? Symptoms of IBS vary from person to person. The main symptom is abdominal pain or discomfort. Additional symptoms usually include one or more of the following:  Diarrhea, constipation, or both.  Abdominal swelling or bloating.  Feeling full or sick after eating a small or regular-size meal.  Frequent gas.  Mucus in the stool.  A feeling of having more stool left after a bowel movement.  Symptoms tend to come and go. They may be associated with stress, psychiatric conditions, or nothing at all. How is this diagnosed? There is no specific test to diagnose IBS. Your health care provider will make a diagnosis based on a physical exam, medical history, and your symptoms. You may have other  tests to rule out other conditions that may be causing your symptoms. These may include:  Blood tests.  X-rays.  CT scan.  Endoscopy and colonoscopy. This is a test in which your GI tract is viewed with a long, thin, flexible tube.  How is this treated? There is no cure for IBS, but treatment can help relieve symptoms. IBS treatment often includes:  Changes to your diet, such as: ? Eating more fiber. ? Avoiding foods that cause symptoms. ? Drinking more water. ? Eating regular, medium-sized portioned meals.  Medicines. These may include: ? Fiber supplements if you have constipation. ? Medicine to control diarrhea (antidiarrheal medicines). ? Medicine to help control muscle spasms in your GI tract (antispasmodic medicines). ? Medicines to help with any mental health issues, such as antidepressants or tranquilizers.  Therapy. ? Talk therapy may help with anxiety, depression, or other mental health issues that can make IBS symptoms worse.  Stress reduction. ? Managing your stress can help keep symptoms under control.  Follow these instructions at home:  Take medicines only as directed by your health care provider.  Eat a healthy diet. ? Avoid foods and drinks with added sugar. ? Include more whole grains, fruits, and vegetables gradually into your diet. This may be especially helpful if you have IBS with constipation. ? Avoid any foods and drinks that make your symptoms worse. These may include dairy products and caffeinated or carbonated drinks. ? Do not eat large meals. ? Drink enough   fluid to keep your urine clear or pale yellow.  Exercise regularly. Ask your health care provider for recommendations of good activities for you.  Keep all follow-up visits as directed by your health care provider. This is important. Contact a health care provider if:  You have constant pain.  You have trouble or pain with swallowing.  You have worsening diarrhea. Get help right away  if:  You have severe and worsening abdominal pain.  You have diarrhea and: ? You have a rash, stiff neck, or severe headache. ? You are irritable, sleepy, or difficult to awaken. ? You are weak, dizzy, or extremely thirsty.  You have bright red blood in your stool or you have black tarry stools.  You have unusual abdominal swelling that is painful.  You vomit continuously.  You vomit blood (hematemesis).  You have both abdominal pain and a fever. This information is not intended to replace advice given to you by your health care provider. Make sure you discuss any questions you have with your health care provider. Document Released: 03/19/2005 Document Revised: 08/19/2015 Document Reviewed: 12/04/2013 Elsevier Interactive Patient Education  2018 [employer].  Diet for Irritable Bowel Syndrome When you have irritable bowel syndrome (IBS), the foods you eat and your eating habits are very important. IBS may cause various symptoms, such as abdominal pain, constipation, or diarrhea. Choosing the right foods can help ease discomfort caused by these symptoms. Work with your health care provider and dietitian to find the best eating plan to help control your symptoms. What general guidelines do I need to follow?  Keep a food diary. This will help you identify foods that cause symptoms. Write down: ? What you eat and when. ? What symptoms you have. ? When symptoms occur in relation to your meals.  Avoid foods that cause symptoms. Talk with your dietitian about other ways to get the same nutrients that are in these foods.  Eat more foods that contain fiber. Take a fiber supplement if directed by your dietitian.  Eat your meals slowly, in a relaxed setting.  Aim to eat 5-6 small meals per day. Do not skip meals.  Drink enough fluids to keep your urine clear or pale yellow.  Ask your health care provider if you should take an over-the-counter probiotic during flare-ups to help restore  healthy gut bacteria.  If you have cramping or diarrhea, try making your meals low in fat and high in carbohydrates. Examples of carbohydrates are pasta, rice, whole grain breads and cereals, fruits, and vegetables.  If dairy products cause your symptoms to flare up, try eating less of them. You might be able to handle yogurt better than other dairy products because it contains bacteria that help with digestion. What foods are not recommended? The following are some foods and drinks that may worsen your symptoms:  Fatty foods, such as [place] fries.  Milk products, such as cheese or ice cream.  Chocolate.  Alcohol.  Products with caffeine, such as coffee.  Carbonated drinks, such as soda.  The items listed above may not be a complete list of foods and beverages to avoid. Contact your dietitian for more information. What foods are good sources of fiber? Your health care provider or dietitian may recommend that you eat more foods that contain fiber. Fiber can help reduce constipation and other IBS symptoms. Add foods with fiber to your diet a little at a time so that your body can get used to them. Too much fiber at  once might cause gas and swelling of your abdomen. The following are some foods that are good sources of fiber:  Apples.  Peaches.  Pears.  Berries.  Figs.  Broccoli (raw).  Cabbage.  Carrots.  Raw peas.  Kidney beans.  Lima beans.  Whole grain bread.  Whole grain cereal.  Where to find more information: [employer] for Functional Gastrointestinal Disorders: www.iffgd.[employer] of Diabetes and Digestive and Kidney Diseases: NetworkAffair.co.za.aspx This information is not intended to replace advice given to you by your health care provider. Make sure you discuss any questions you have with your health care provider. Document Released: 06/09/2003 Document Revised:  08/25/2015 Document Reviewed: 06/19/2013 Elsevier Interactive Patient Education  2018 [employer].   10 Relaxation Techniques That Zap Stress Fast By Barrie Folk   Listen  Relax. You deserve it, it's good for you, and it takes less time than you think. You don't need a spa weekend or a retreat. Each of these stress-relieving tips can get you from OMG to om in less than 15 minutes. 1. Meditate  A few minutes of practice per day can help ease anxiety. "Research suggests that daily meditation may alter the brain's neural pathways, making you more resilient to stress," says psychologist Vinson Moselle, PhD, a [place] and wellness coach. It's simple. Sit up straight with both feet on the floor. Close your eyes. Focus your attention on reciting -- out loud or silently -- a positive mantra such as "I feel at peace" or "I love myself." Place one hand on your belly to sync the mantra with your breaths. Let any distracting thoughts float by like clouds. 2. Breathe Deeply  Take a 5-minute break and focus on your breathing. Sit up straight, eyes closed, with a hand on your belly. Slowly inhale through your nose, feeling the breath start in your abdomen and work its way to the top of your head. Reverse the process as you exhale through your mouth.  "Deep breathing counters the effects of stress by slowing the heart rate and lowering blood pressure," psychologist Verdene Rio, PhD, says. She's a certified life coach in [place], [place] 3. Be Present  Slow down.  "Take 5 minutes and focus on only one behavior with awareness," Serena Colonel says. Notice how the air feels on your face when you're walking and how your feet feel hitting the ground. Enjoy the texture and taste of each bite of food. When you spend time in the moment and focus on your senses, you should feel less tense. 4. Reach Out  Your social network is one of your best tools for handling stress. Talk to others -- preferably face to face,  or at least on the phone. Share what's going on. You can get a fresh perspective while keeping your connection strong. 5. Tune In to Your Body  Mentally scan your body to get a sense of how stress affects it each day. Lie on your back, or sit with your feet on the floor. Start at your toes and work your way up to your scalp, noticing how your body feels.  10 Relaxation Techniques That Zap Stress Fast By Barrie Folk   Listen  "Simply be aware of places you feel tight or loose without trying to change anything," Serena Colonel says. For 1 to 2 minutes, imagine each deep breath flowing to that body part. Repeat this process as you move your focus up your body, paying close attention to sensations you feel in each body part.  6. Decompress  Place a warm heat wrap around your neck and shoulders for 10 minutes. Close your eyes and relax your face, neck, upper chest, and back muscles. Remove the wrap, and use a tennis ball or foam roller to massage away tension.  "Place the ball between your back and the wall. Lean into the ball, and hold gentle pressure for up to 15 seconds. Then move the ball to another spot, and apply pressure," says Derenda Mis, a nurse practitioner and assistant professor at [employer] [facility] in [place]. 7. Laugh Out Loud  A good belly laugh doesn't just lighten the load mentally. It lowers cortisol, your body's stress hormone, and boosts brain chemicals called endorphins, which help your mood. Lighten up by tuning in to your favorite sitcom or video, reading the comics, or chatting with someone who makes you smile. 8. Crank Up the [place] shows that listening to soothing music can lower blood pressure, heart rate, and anxiety. "Create a playlist of songs or nature sounds (the ocean, a bubbling brook, birds chirping), and allow your mind to focus on the different melodies, instruments, or singers in the piece," Benninger says. You also can blow  off steam by rocking out to more upbeat tunes -- or singing at the top of your lungs! 9. Get Moving  You don't have to run in order to get a runner's high. All forms of exercise, including yoga and walking, can ease depression and anxiety by helping the brain release feel-good chemicals and by giving your body a chance to practice dealing with stress. You can go for a quick walk around the block, take the stairs up and down a few flights, or do some stretching exercises like head rolls and shoulder shrugs. 10. Be Grateful  Keep a gratitude journal or several (one by your bed, one in your purse, and one at work) to help you remember all the things that are good in your life.  "Being grateful for your blessings cancels out negative thoughts and worries," says Joni Emmerling, a wellness coach in [place], [place].  Use these journals to savor good experiences like a child's smile, a sunshine-filled day, and good health. Don't forget to celebrate accomplishments like mastering a new task at work or a new hobby. When you start feeling stressed, spend a few minutes looking through your notes to remind yourself what really matters.

## 2016-11-20 NOTE — Progress Notes (Signed)
Patient: Donna Malone Female    DOB: 1946/01/28   71 y.o.   MRN: [id number] Visit Date: 11/20/2016  Today's Provider: Mar Daring, PA-C   Chief Complaint  Patient presents with  . Diabetes  . Hypertension  . Hyperlipidemia   Subjective:    HPI  Diabetes Mellitus Type II, Follow-up:   Lab Results  Component Value Date   HGBA1C 6.1 11/20/2016   HGBA1C 6.4 05/23/2016   HGBA1C 6.2 (H) 11/10/2015   Last seen for diabetes 6 months ago.  Management since then includes no changes. She reports excellent compliance with treatment. She is not having side effects.  Current symptoms include none and have been stable. Home blood sugar records: fasting range: 104 this morning  Episodes of hypoglycemia? no   Current Insulin Regimen:  Most Recent Eye Exam: UTD Weight trend: stable Prior visit with dietician: no Current diet: in general, a "healthy" diet   Current exercise: housecleaning  ------------------------------------------------------------------------   Hypertension, follow-up:  BP Readings from Last 3 Encounters:  11/20/16 106/60  05/23/16 110/74  03/20/16 136/78    She was last seen for hypertension 6 months ago.  BP at that visit was 110/74. Management since that visit includes no changes.She reports excellent compliance with treatment. She is not having side effects.  She is not exercising. She is adherent to low salt diet.   Outside blood pressures are stable. She is experiencing none.  Patient denies chest pain.   Cardiovascular risk factors include diabetes mellitus, dyslipidemia, hypertension and obesity (BMI >= 30 kg/m2).  Use of agents associated with hypertension: none.   ------------------------------------------------------------------------    Lipid/Cholesterol, Follow-up:   Last seen for this 6 months ago.  Management since that visit includes no changes.  Last Lipid Panel:    Component Value Date/Time   CHOL 161  11/10/2015 1047   TRIG 397 (H) 11/10/2015 1047   HDL 34 (L) 11/10/2015 1047   CHOLHDL 4.7 (H) 11/10/2015 1047   LDLCALC 48 11/10/2015 1047    She reports excellent compliance with treatment. She is not having side effects.   Wt Readings from Last 3 Encounters:  11/20/16 197 lb (89.4 kg)  05/23/16 209 lb (94.8 kg)  03/20/16 212 lb 2 oz (96.2 kg)   ------------------------------------------------------------------------      Allergies  Allergen Reactions  . Sulfa Antibiotics      Current Outpatient Prescriptions:  .  acetaminophen (TYLENOL) 500 MG tablet, Take 1,000 mg by mouth every 8 (eight) hours as needed., Disp: , Rfl:  .  allopurinol (ZYLOPRIM) 100 MG tablet, Take 1 tablet (100 mg total) by mouth daily., Disp: 90 tablet, Rfl: 3 .  aspirin 81 MG tablet, Take 81 mg by mouth daily. , Disp: , Rfl:  .  atorvastatin (LIPITOR) 40 MG tablet, Take 1 tablet by mouth  daily, Disp: 90 tablet, Rfl: 3 .  colchicine 0.6 MG tablet, Take 1 tablet (0.6 mg total) by mouth 2 (two) times daily., Disp: 180 tablet, Rfl: 1 .  escitalopram (LEXAPRO) 20 MG tablet, Take 1 tablet (20 mg total) by mouth daily., Disp: 90 tablet, Rfl: 3 .  furosemide (LASIX) 40 MG tablet, Take 1 tablet (40 mg total) by mouth daily., Disp: 90 tablet, Rfl: 3 .  glipiZIDE (GLUCOTROL XL) 5 MG 24 hr tablet, Take 1 tablet (5 mg total) by mouth daily with breakfast., Disp: 90 tablet, Rfl: 3 .  levothyroxine (SYNTHROID, LEVOTHROID) 75 MCG tablet, Take 1 tablet by mouth  daily, Disp: 90 tablet, Rfl: 2 .  lisinopril (PRINIVIL,ZESTRIL) 5 MG tablet, Take 1 tablet by mouth every other day, Disp: 90 tablet, Rfl: 3 .  metFORMIN (GLUCOPHAGE) 1000 MG tablet, Take 1 tablet by mouth twice a day, Disp: 180 tablet, Rfl: 2 .  ONE TOUCH ULTRA TEST test strip, TEST BLOOD SUGAR ONCE DAILY, Disp: 100 each, Rfl: 5 .  potassium chloride (MICRO-K) 10 MEQ CR capsule, Take 1 capsule by mouth  daily, Disp: 90 capsule, Rfl: 3 .  ranitidine (ZANTAC) 150  MG tablet, Take 1 tablet (150 mg total) by mouth 2 (two) times daily., Disp: 180 tablet, Rfl: 3 .  traZODone (DESYREL) 100 MG tablet, Take 1 tablet by mouth at  bedtime, Disp: 90 tablet, Rfl: 3 .  Vitamin D, Ergocalciferol, (DRISDOL) 50000 units CAPS capsule, Take 1 capsule (50,000 Units total) by mouth once a week., Disp: 12 capsule, Rfl: 1  Review of Systems  Constitutional: Positive for fatigue.  Respiratory: Negative.   Cardiovascular: Negative.   Gastrointestinal: Positive for abdominal pain, diarrhea and nausea.  Musculoskeletal: Positive for arthralgias (left hip pain).  Neurological: Negative.   Psychiatric/Behavioral: Positive for dysphoric mood and sleep disturbance. The patient is nervous/anxious.     Social History  Substance Use Topics  . Smoking status: Never Smoker  . Smokeless tobacco: Never Used  . Alcohol use No   Objective:   BP 106/60 (BP Location: Left Arm, Patient Position: Sitting, Cuff Size: Large)   Pulse 84   Temp 97.8 F (36.6 C) (Oral)   Resp 16   Ht 5\' 4"  (1.626 m)   Wt 197 lb (89.4 kg)   SpO2 99%   BMI 33.81 kg/m  Vitals:   11/20/16 0818  BP: 106/60  Pulse: 84  Resp: 16  Temp: 97.8 F (36.6 C)  TempSrc: Oral  SpO2: 99%  Weight: 197 lb (89.4 kg)  Height: 5\' 4"  (1.626 m)     Physical Exam  Constitutional: She is oriented to person, place, and time. She appears well-developed and well-nourished. No distress.  Cardiovascular: Normal rate, regular rhythm and normal heart sounds.  Exam reveals no gallop and no friction rub.   No murmur heard. Pulmonary/Chest: Effort normal and breath sounds normal. No respiratory distress. She has no wheezes. She has no rales.  Abdominal: Soft. Normal appearance and bowel sounds are normal. She exhibits no distension and no mass. There is no hepatosplenomegaly. There is generalized tenderness. There is no rebound, no guarding and no CVA tenderness.  Musculoskeletal: She exhibits no edema.  Neurological: She  is alert and oriented to person, place, and time.  Skin: Skin is warm and dry. She is not diaphoretic.  Vitals reviewed.       Assessment & Plan:     1. Type 2 diabetes mellitus without complication, without long-term current use of insulin (HCC) A1c improved to 6.1. Continue metformin and glipizide. Will check other labs as below due to patient having increased loose bowel movements (5 per day, watery; suspect ibs-D). She has been under increased stress with family issues and planning a family reunion. I will follow up with her pending results of the labs.  - POCT glycosylated hemoglobin (Hb A1C) - CBC w/Diff/Platelet - Comprehensive Metabolic Panel (CMET) - Lipid Profile - TSH - Vitamin D (25 hydroxy) - B12  2. Essential hypertension Stable. Continue lisinopril 5mg . Will check labs as below and f/u pending results. - CBC w/Diff/Platelet - Comprehensive Metabolic Panel (CMET) - Lipid Profile - TSH -  Vitamin D (25 hydroxy) - B12  3. Hypercholesteremia Stable. Continue atorvastatin 40mg . Will check labs as below and f/u pending results. - CBC w/Diff/Platelet - Comprehensive Metabolic Panel (CMET) - Lipid Profile - TSH - Vitamin D (25 hydroxy) - B12  4. Heart & renal disease, hypertensive, with heart failure ([place]) Will check labs as below and f/u pending results. - CBC w/Diff/Platelet - Comprehensive Metabolic Panel (CMET) - Lipid Profile - TSH - Vitamin D (25 hydroxy) - B12  5. Acquired hypothyroidism Continue levothyroxine 67mcg. Will check labs as below and f/u pending results. - CBC w/Diff/Platelet - Comprehensive Metabolic Panel (CMET) - Lipid Profile - TSH - Vitamin D (25 hydroxy) - B12  6. Chronic kidney disease (CKD), stage III (moderate) Will check labs as below and f/u pending results. - CBC w/Diff/Platelet - Comprehensive Metabolic Panel (CMET) - Lipid Profile - TSH - Vitamin D (25 hydroxy) - B12  7. Fatigue, unspecified type Will check labs  as below and f/u pending results. - CBC w/Diff/Platelet - Comprehensive Metabolic Panel (CMET) - Lipid Profile - TSH - Vitamin D (25 hydroxy) - B12  8. Avitaminosis D Continue high dose Vit D and will check labs.   9. 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 Checking labs and will f/u pending lab results. Referral placed to GI since patient has never had colonoscopy and has been having bowel changes over the last year. Denies any BRB or melena.  - Ambulatory referral to Gastroenterology  10. Colon cancer screening See above medical treatment plan. - Ambulatory referral to Gastroenterology       Mar Daring, PA-C  [facility]

## 2016-11-21 LAB — TSH: TSH: 2.62 u[IU]/mL (ref 0.450–4.500)

## 2016-11-21 LAB — CBC WITH DIFFERENTIAL/PLATELET
BASOS: 1 %
Basophils Absolute: 0.1 10*3/uL (ref 0.0–0.2)
EOS (ABSOLUTE): 0.6 10*3/uL — ABNORMAL HIGH (ref 0.0–0.4)
Eos: 6 %
HEMATOCRIT: 39.4 % (ref 34.0–46.6)
HEMOGLOBIN: 12.8 g/dL (ref 11.1–15.9)
IMMATURE GRANS (ABS): 0 10*3/uL (ref 0.0–0.1)
Immature Granulocytes: 0 %
LYMPHS ABS: 3.1 10*3/uL (ref 0.7–3.1)
LYMPHS: 34 %
MCH: 33.6 pg — AB (ref 26.6–33.0)
MCHC: 32.5 g/dL (ref 31.5–35.7)
MCV: 103 fL — AB (ref 79–97)
MONOCYTES: 10 %
Monocytes Absolute: 0.9 10*3/uL (ref 0.1–0.9)
NEUTROS ABS: 4.3 10*3/uL (ref 1.4–7.0)
Neutrophils: 49 %
Platelets: 305 10*3/uL (ref 150–379)
RBC: 3.81 x10E6/uL (ref 3.77–5.28)
RDW: 15.1 % (ref 12.3–15.4)
WBC: 8.9 10*3/uL (ref 3.4–10.8)

## 2016-11-21 LAB — COMPREHENSIVE METABOLIC PANEL
ALBUMIN: 4.1 g/dL (ref 3.5–4.8)
ALK PHOS: 107 IU/L (ref 39–117)
ALT: 55 IU/L — ABNORMAL HIGH (ref 0–32)
AST: 88 IU/L — ABNORMAL HIGH (ref 0–40)
Albumin/Globulin Ratio: 1.4 (ref 1.2–2.2)
BILIRUBIN TOTAL: 0.5 mg/dL (ref 0.0–1.2)
BUN / CREAT RATIO: 15 (ref 12–28)
BUN: 30 mg/dL — AB (ref 8–27)
CHLORIDE: 102 mmol/L (ref 96–106)
CO2: 19 mmol/L — ABNORMAL LOW (ref 20–29)
Calcium: 9.9 mg/dL (ref 8.7–10.3)
Creatinine, Ser: 1.96 mg/dL — ABNORMAL HIGH (ref 0.57–1.00)
GFR calc non Af Amer: 25 mL/min/{1.73_m2} — ABNORMAL LOW (ref >59)
GFR, EST AFRICAN AMERICAN: 29 mL/min/{1.73_m2} — AB (ref >59)
GLOBULIN, TOTAL: 2.9 g/dL (ref 1.5–4.5)
GLUCOSE: 134 mg/dL — AB (ref 65–99)
Potassium: 4.6 mmol/L (ref 3.5–5.2)
SODIUM: 139 mmol/L (ref 134–144)
TOTAL PROTEIN: 7 g/dL (ref 6.0–8.5)

## 2016-11-21 LAB — LIPID PANEL
CHOLESTEROL TOTAL: 149 mg/dL (ref 100–199)
Chol/HDL Ratio: 3.6 ratio (ref 0.0–4.4)
HDL: 41 mg/dL (ref >39)
LDL CALC: 62 mg/dL (ref 0–99)
Triglycerides: 232 mg/dL — ABNORMAL HIGH (ref 0–149)
VLDL CHOLESTEROL CAL: 46 mg/dL — AB (ref 5–40)

## 2016-11-21 LAB — VITAMIN B12: VITAMIN B 12: 179 pg/mL — AB (ref 232–1245)

## 2016-11-21 LAB — VITAMIN D 25 HYDROXY (VIT D DEFICIENCY, FRACTURES): VIT D 25 HYDROXY: 66.2 ng/mL (ref 30.0–100.0)

## 2016-11-22 ENCOUNTER — Telehealth: Payer: Self-pay

## 2016-11-22 NOTE — Telephone Encounter (Signed)
Patient advised as below. Patient verbalizes understanding and is in agreement with treatment plan.  

## 2016-11-22 NOTE — Telephone Encounter (Signed)
[email or web address]

## 2016-11-22 NOTE — Telephone Encounter (Signed)
-----   Message from Mar Daring, PA-C sent at 11/22/2016  8:12 AM EDT ----- Decreased kidney function compared to labs one year ago. Cholesterol stable. B12 decreased as well. All other labs are ok. Would recommend patient to return to discuss blood work and treatment options.

## 2016-11-23 ENCOUNTER — Other Ambulatory Visit: Payer: Self-pay | Admitting: Physician Assistant

## 2016-11-23 DIAGNOSIS — M109 Gout, unspecified: Secondary | ICD-10-CM

## 2016-11-26 ENCOUNTER — Ambulatory Visit (INDEPENDENT_AMBULATORY_CARE_PROVIDER_SITE_OTHER): Payer: PPO | Admitting: Physician Assistant

## 2016-11-26 VITALS — BP 120/64 | HR 76 | Temp 98.1°F | Resp 16 | Wt 197.0 lb

## 2016-11-26 DIAGNOSIS — N183 Chronic kidney disease, stage 3 unspecified: Secondary | ICD-10-CM

## 2016-11-26 DIAGNOSIS — E538 Deficiency of other specified B group vitamins: Secondary | ICD-10-CM | POA: Diagnosis not present

## 2016-11-26 NOTE — Progress Notes (Signed)
Patient: Donna Malone Female    DOB: 11/16/1945   71 y.o.   MRN: [id number] Visit Date: 11/26/2016  Today's Provider: Mar Daring, PA-C   Chief Complaint  Patient presents with  . Follow-up    labs   Subjective:    HPI Patient here today to follow-up on lab results. Decreased kidney function compared to labs one year ago. Cholesterol stable. B12 decreased as well. Patient has been having increased fatigue. Patient reports her GFR has been as low as 23 before. She has seen Dr. Candiss Norse in the past but was discharged once he optimized her labs.   Lab Results  Component Value Date   WBC 8.9 11/20/2016   HGB 12.8 11/20/2016   HCT 39.4 11/20/2016   PLT 305 11/20/2016   GLUCOSE 134 (H) 11/20/2016   CHOL 149 11/20/2016   TRIG 232 (H) 11/20/2016   HDL 41 11/20/2016   LDLCALC 62 11/20/2016   ALT 55 (H) 11/20/2016   AST 88 (H) 11/20/2016   NA 139 11/20/2016   K 4.6 11/20/2016   CL 102 11/20/2016   CREATININE 1.96 (H) 11/20/2016   BUN 30 (H) 11/20/2016   CO2 19 (L) 11/20/2016   TSH 2.620 11/20/2016   HGBA1C 6.1 11/20/2016       Allergies  Allergen Reactions  . Sulfa Antibiotics      Current Outpatient Prescriptions:  .  acetaminophen (TYLENOL) 500 MG tablet, Take 1,000 mg by mouth every 8 (eight) hours as needed., Disp: , Rfl:  .  allopurinol (ZYLOPRIM) 100 MG tablet, Take 1 tablet (100 mg total) by mouth daily., Disp: 90 tablet, Rfl: 3 .  aspirin 81 MG tablet, Take 81 mg by mouth daily. , Disp: , Rfl:  .  atorvastatin (LIPITOR) 40 MG tablet, Take 1 tablet by mouth  daily, Disp: 90 tablet, Rfl: 3 .  colchicine 0.6 MG tablet, TAKE 1 TABLET BY MOUTH TWICE A DAY, Disp: 60 tablet, Rfl: 5 .  escitalopram (LEXAPRO) 20 MG tablet, Take 1 tablet (20 mg total) by mouth daily., Disp: 90 tablet, Rfl: 3 .  furosemide (LASIX) 40 MG tablet, Take 1 tablet (40 mg total) by mouth daily., Disp: 90 tablet, Rfl: 3 .  glipiZIDE (GLUCOTROL XL) 5 MG 24 hr tablet, Take 1 tablet  (5 mg total) by mouth daily with breakfast., Disp: 90 tablet, Rfl: 3 .  levothyroxine (SYNTHROID, LEVOTHROID) 75 MCG tablet, Take 1 tablet by mouth daily, Disp: 90 tablet, Rfl: 2 .  lisinopril (PRINIVIL,ZESTRIL) 5 MG tablet, Take 1 tablet by mouth every other day, Disp: 90 tablet, Rfl: 3 .  metFORMIN (GLUCOPHAGE) 1000 MG tablet, Take 1 tablet by mouth twice a day, Disp: 180 tablet, Rfl: 2 .  ONE TOUCH ULTRA TEST test strip, TEST BLOOD SUGAR ONCE DAILY, Disp: 100 each, Rfl: 5 .  potassium chloride (MICRO-K) 10 MEQ CR capsule, Take 1 capsule by mouth  daily, Disp: 90 capsule, Rfl: 3 .  ranitidine (ZANTAC) 150 MG tablet, Take 1 tablet (150 mg total) by mouth 2 (two) times daily., Disp: 180 tablet, Rfl: 3 .  traZODone (DESYREL) 100 MG tablet, Take 1 tablet by mouth at  bedtime, Disp: 90 tablet, Rfl: 3 .  Vitamin D, Ergocalciferol, (DRISDOL) 50000 units CAPS capsule, Take 1 capsule (50,000 Units total) by mouth once a week., Disp: 12 capsule, Rfl: 1  Review of Systems  Constitutional: Positive for fatigue.  Respiratory: Negative.   Cardiovascular: Negative.   Gastrointestinal: Negative.  Genitourinary: Negative.   Neurological: Negative.     Social History  Substance Use Topics  . Smoking status: Never Smoker  . Smokeless tobacco: Never Used  . Alcohol use No   Objective:   BP 120/64 (BP Location: Left Arm, Patient Position: Sitting, Cuff Size: Large)   Pulse 76   Temp 98.1 F (36.7 C) (Oral)   Resp 16   Wt 197 lb (89.4 kg)   SpO2 97%   BMI 33.81 kg/m  Vitals:   11/26/16 1540  BP: 120/64  Pulse: 76  Resp: 16  Temp: 98.1 F (36.7 C)  TempSrc: Oral  SpO2: 97%  Weight: 197 lb (89.4 kg)     Physical Exam  Constitutional: She appears well-developed and well-nourished. No distress.  Neck: Normal range of motion. Neck supple. No JVD present. No tracheal deviation present. No thyromegaly present.  Cardiovascular: Normal rate, regular rhythm and normal heart sounds.  Exam  reveals no gallop and no friction rub.   No murmur heard. Pulmonary/Chest: Effort normal and breath sounds normal. No respiratory distress. She has no wheezes. She has no rales.  Musculoskeletal: She exhibits no edema.  Lymphadenopathy:    She has no cervical adenopathy.  Skin: She is not diaphoretic.  Vitals reviewed.       Assessment & Plan:     1. B12 deficiency Patient is going to start an OTC B12 supplement and we will recheck labs in 4 weeks to see if she is absorbing. If B12 remains low after oral supplementation we will start B12 injections. I will see her back in 4 weeks to discuss labs.  - Vitamin B12  2. Chronic kidney disease (CKD), stage III (moderate) Discussed hydration and pushing fluids. She is also going to take her lisinopril 5 mg every other day. We will recheck labs in 4 weeks. If renal function still low I will refer her back to Dr. Candiss Norse. She is in agreement.  - Renal Function Panel       Mar Daring, PA-C  [facility]

## 2016-11-26 NOTE — Patient Instructions (Signed)
Take lisinopril 5mg  every other day Push fluids Start OTC B12 Recheck labs in 4 weeks

## 2016-12-05 ENCOUNTER — Telehealth: Payer: Self-pay | Admitting: Gastroenterology

## 2016-12-05 NOTE — Telephone Encounter (Signed)
RETURNING a call to schedule

## 2016-12-21 DIAGNOSIS — E538 Deficiency of other specified B group vitamins: Secondary | ICD-10-CM | POA: Diagnosis not present

## 2016-12-21 DIAGNOSIS — N183 Chronic kidney disease, stage 3 (moderate): Secondary | ICD-10-CM | POA: Diagnosis not present

## 2016-12-22 LAB — RENAL FUNCTION PANEL
ALBUMIN: 4 g/dL (ref 3.5–4.8)
BUN/Creatinine Ratio: 11 — ABNORMAL LOW (ref 12–28)
BUN: 16 mg/dL (ref 8–27)
CALCIUM: 9.8 mg/dL (ref 8.7–10.3)
CO2: 21 mmol/L (ref 20–29)
CREATININE: 1.41 mg/dL — AB (ref 0.57–1.00)
Chloride: 100 mmol/L (ref 96–106)
GFR calc Af Amer: 43 mL/min/{1.73_m2} — ABNORMAL LOW (ref >59)
GFR calc non Af Amer: 37 mL/min/{1.73_m2} — ABNORMAL LOW (ref >59)
Glucose: 147 mg/dL — ABNORMAL HIGH (ref 65–99)
PHOSPHORUS: 3.1 mg/dL (ref 2.5–4.5)
Potassium: 4.9 mmol/L (ref 3.5–5.2)
Sodium: 138 mmol/L (ref 134–144)

## 2016-12-22 LAB — VITAMIN B12: Vitamin B-12: 255 pg/mL (ref 232–1245)

## 2016-12-23 ENCOUNTER — Other Ambulatory Visit: Payer: Self-pay | Admitting: Physician Assistant

## 2016-12-23 DIAGNOSIS — E559 Vitamin D deficiency, unspecified: Secondary | ICD-10-CM

## 2016-12-24 ENCOUNTER — Ambulatory Visit (INDEPENDENT_AMBULATORY_CARE_PROVIDER_SITE_OTHER): Payer: PPO | Admitting: Physician Assistant

## 2016-12-24 ENCOUNTER — Ambulatory Visit
Admission: RE | Admit: 2016-12-24 | Discharge: 2016-12-24 | Disposition: A | Discharge status: Home / Self Care | Payer: PPO | Source: Ambulatory Visit | Attending: Physician Assistant | Admitting: Physician Assistant

## 2016-12-24 ENCOUNTER — Encounter: Payer: Self-pay | Admitting: Physician Assistant

## 2016-12-24 VITALS — BP 128/70 | HR 72 | Temp 98.7°F | Resp 16

## 2016-12-24 DIAGNOSIS — M25552 Pain in left hip: Secondary | ICD-10-CM

## 2016-12-24 DIAGNOSIS — Z23 Encounter for immunization: Secondary | ICD-10-CM | POA: Diagnosis not present

## 2016-12-24 DIAGNOSIS — N183 Chronic kidney disease, stage 3 unspecified: Secondary | ICD-10-CM

## 2016-12-24 DIAGNOSIS — E119 Type 2 diabetes mellitus without complications: Secondary | ICD-10-CM

## 2016-12-24 MED ORDER — MELOXICAM 7.5 MG PO TABS: 7.5000 mg | ORAL_TABLET | Freq: Every day | ORAL | 0 refills | Status: DC

## 2016-12-24 MED ORDER — METFORMIN HCL 1000 MG PO TABS: 1000.0000 mg | ORAL_TABLET | Freq: Every day | ORAL | 2 refills | Status: DC

## 2016-12-24 NOTE — Patient Instructions (Signed)
Bursitis Bursitis is inflammation and irritation of a bursa, which is one of the small, fluid-filled sacs that cushion and protect the moving parts of your body. These sacs are located between bones and muscles, muscle attachments, or skin areas next to bones. A bursa protects these structures from the wear and tear that results from frequent movement. An inflamed bursa causes pain and swelling. Fluid may build up inside the sac. Bursitis is most common near joints, especially the knees, elbows, hips, and shoulders. What are the causes? Bursitis can be caused by:  Injury from: ? A direct blow, like falling on your knee or elbow. ? Overuse of a joint (repetitive stress).  Infection. This can happen if bacteria gets into a bursa through a cut or scrape near a joint.  Diseases that cause joint inflammation, such as gout and rheumatoid arthritis.  What increases the risk? You may be at risk for bursitis if you:  Have a job or hobby that involves a lot of repetitive stress on your joints.  Have a condition that weakens your body's defense system (immune system), such as diabetes, cancer, or HIV.  Lift and reach overhead often.  Kneel or lean on hard surfaces often.  Run or walk often.  What are the signs or symptoms? The most common signs and symptoms of bursitis are:  Pain that gets worse when you move the affected body part or put weight on it.  Inflammation.  Stiffness.  Other signs and symptoms may include:  Redness.  Tenderness.  Warmth.  Pain that continues after rest.  Fever and chills. This may occur in bursitis caused by infection.  How is this diagnosed? Bursitis may be diagnosed by:  Medical history and physical exam.  MRI.  A procedure to drain fluid from the bursa with a needle (aspiration). The fluid may be checked for signs of infection or gout.  Blood tests to rule out other causes of inflammation.  How is this treated? Bursitis can usually be  treated at home with rest, ice, compression, and elevation (RICE). For mild bursitis, RICE treatment may be all you need. Other treatments may include:  Nonsteroidal anti-inflammatory drugs (NSAIDs) to treat pain and inflammation.  Corticosteroids to fight inflammation. You may have these drugs injected into and around the area of bursitis.  Aspiration of bursitis fluid to relieve pain and improve movement.  Antibiotic medicine to treat an infected bursa.  A splint, brace, or walking aid.  Physical therapy if you continue to have pain or limited movement.  Surgery to remove a damaged or infected bursa. This may be needed if you have a very bad case of bursitis or if other treatments have not worked.  Follow these instructions at home:  Take medicines only as directed by your health care provider.  If you were prescribed an antibiotic medicine, finish it all even if you start to feel better.  Rest the affected area as directed by your health care provider. ? Keep the area elevated. ? Avoid activities that make pain worse.  Apply ice to the injured area: ? Place ice in a plastic bag. ? Place a towel between your skin and the bag. ? Leave the ice on for 20 minutes, 2-3 times a day.  Use splints, braces, pads, or walking aids as directed by your health care provider.  Keep all follow-up visits as directed by your health care provider. This is important. How is this prevented?  Wear knee pads if you kneel often.    Wear sturdy running or walking shoes that fit you well.  Take regular breaks from repetitive activity.  Warm up by stretching before doing any strenuous activity.  Maintain a healthy weight or lose weight as recommended by your health care provider. Ask your health care provider if you need help.  Exercise regularly. Start any new physical activity gradually. Contact a health care provider if:  Your bursitis is not responding to treatment or home care.  You have  a fever.  You have chills. This information is not intended to replace advice given to you by your health care provider. Make sure you discuss any questions you have with your health care provider. Document Released: 03/16/2000 Document Revised: 08/25/2015 Document Reviewed: 06/08/2013 Elsevier Interactive Patient Education  2018 [employer]vier Inc[employer]  

## 2016-12-24 NOTE — Progress Notes (Signed)
Patient: Donna Malone Female    DOB: [birth year]   71 y.o.   MRN: [id number] Visit Date: 12/24/2016  Today's Provider: Mar Daring, PA-C   Chief Complaint  Patient presents with  . Follow-up    Labs   Subjective:    HPI Patient is here today to follow-up on labs. Last lab check on 11/20/16 showed decreased renal function, decreased B12 and elevated liver enzymes. She decreased lisinopril to every other day and pushed fluids. She also started B12 oral supplementation. Labs were rechecked on 12/21/16.  Patient is also concern that for the last 2-3 weeks her sugar levels have been dropping to mid 70's. She says it makes her feel shaky. Patient reports that she didn't take the Metformin like 2-3 nights to see if it helped a little. She is currently on Metformin 1000mg  BID. Most recent A1c was 6.4.  She also reports that a couple of months ago she was going down the stairs at home and felt like something pop on left hip. It still hurts over the lateral aspect of the left hip. It does not radiate down the leg. Denies numbness or tingling. Has been doing water aerobics 4x per week that did not help the pain. Has not taken anything for it.      Allergies  Allergen Reactions  . Sulfa Antibiotics      Current Outpatient Prescriptions:  .  acetaminophen (TYLENOL) 500 MG tablet, Take 1,000 mg by mouth every 8 (eight) hours as needed., Disp: , Rfl:  .  allopurinol (ZYLOPRIM) 100 MG tablet, Take 1 tablet (100 mg total) by mouth daily., Disp: 90 tablet, Rfl: 3 .  aspirin 81 MG tablet, Take 81 mg by mouth daily. , Disp: , Rfl:  .  atorvastatin (LIPITOR) 40 MG tablet, Take 1 tablet by mouth  daily, Disp: 90 tablet, Rfl: 3 .  B Complex Vitamins (GNP VITAMIN B COMPLEX PO), Take by mouth., Disp: , Rfl:  .  colchicine 0.6 MG tablet, TAKE 1 TABLET BY MOUTH TWICE A DAY, Disp: 60 tablet, Rfl: 5 .  escitalopram (LEXAPRO) 20 MG tablet, Take 1 tablet (20 mg total) by mouth daily., Disp: 90  tablet, Rfl: 3 .  furosemide (LASIX) 40 MG tablet, Take 1 tablet (40 mg total) by mouth daily., Disp: 90 tablet, Rfl: 3 .  glipiZIDE (GLUCOTROL XL) 5 MG 24 hr tablet, Take 1 tablet (5 mg total) by mouth daily with breakfast., Disp: 90 tablet, Rfl: 3 .  levothyroxine (SYNTHROID, LEVOTHROID) 75 MCG tablet, Take 1 tablet by mouth daily, Disp: 90 tablet, Rfl: 2 .  lisinopril (PRINIVIL,ZESTRIL) 5 MG tablet, Take 1 tablet by mouth every other day, Disp: 90 tablet, Rfl: 3 .  metFORMIN (GLUCOPHAGE) 1000 MG tablet, Take 1 tablet by mouth twice a day, Disp: 180 tablet, Rfl: 2 .  ONE TOUCH ULTRA TEST test strip, TEST BLOOD SUGAR ONCE DAILY, Disp: 100 each, Rfl: 5 .  potassium chloride (MICRO-K) 10 MEQ CR capsule, Take 1 capsule by mouth  daily, Disp: 90 capsule, Rfl: 3 .  ranitidine (ZANTAC) 150 MG tablet, Take 1 tablet (150 mg total) by mouth 2 (two) times daily., Disp: 180 tablet, Rfl: 3 .  traZODone (DESYREL) 100 MG tablet, Take 1 tablet by mouth at  bedtime, Disp: 90 tablet, Rfl: 3 .  Vitamin D, Ergocalciferol, (DRISDOL) 50000 units CAPS capsule, TAKE 1 CAPSULE (50,000 UNITS TOTAL) BY MOUTH ONCE A WEEK., Disp: 12 capsule, Rfl: 1  Review  of Systems  Constitutional: Negative.   HENT: Negative.   Respiratory: Negative.   Cardiovascular: Negative.   Gastrointestinal: Negative.   Musculoskeletal: Positive for arthralgias and gait problem (stiffness when 1st starting). Negative for back pain, joint swelling and myalgias.  Neurological: Negative for dizziness, weakness, numbness and headaches.    Social History  Substance Use Topics  . Smoking status: Never Smoker  . Smokeless tobacco: Never Used  . Alcohol use No   Objective:   BP 128/70 (BP Location: Left Arm, Patient Position: Sitting, Cuff Size: Large)   Pulse 72   Temp 98.7 F (37.1 C) (Oral)   Resp 16    Physical Exam  Constitutional: She appears well-developed and well-nourished. No distress.  Neck: Normal range of motion. Neck supple.   Cardiovascular: Normal rate, regular rhythm and normal heart sounds.  Exam reveals no gallop and no friction rub.   No murmur heard. Pulmonary/Chest: Effort normal and breath sounds normal. No respiratory distress. She has no wheezes. She has no rales.  Musculoskeletal:       Left hip: She exhibits tenderness (over left greater trochanter) and bony tenderness (over left greater trochanter). She exhibits normal range of motion, normal strength, no swelling, no crepitus, no deformity and no laceration.  Skin: She is not diaphoretic.  Vitals reviewed.       Assessment & Plan:     1. Chronic kidney disease (CKD), stage III (moderate) Labs improved to baseline. Continue lisinopril every other day. Stay well hydrated. Will closely monitor. I will see her back in 3 months to recheck labs.  2. Type 2 diabetes mellitus without complication, without long-term current use of insulin (HCC) A1c had been 6.4. Having some lows. Will decrease metformin to 1000mg  once daily instead of BID. If fasting morning sugars start to elevate I will have her take 500mg  at bedtime instead of 1000mg .  - metFORMIN (GLUCOPHAGE) 1000 MG tablet; Take 1 tablet (1,000 mg total) by mouth daily with breakfast.  Dispense: 180 tablet; Refill: 2  3. Left hip pain Suspect left greater trochanter bursitis with possible IT band syndrome. Will get imaging as below to r/o bony abnormality and I will f/u pending these results. Meloxicam given as below for anti-inflammatory. Discussed possible PT if meloxicam does not help and xray normal.  - DG HIP UNILAT WITH PELVIS 2-3 VIEWS LEFT; Future - meloxicam (MOBIC) 7.5 MG tablet; Take 1 tablet (7.5 mg total) by mouth daily.  Dispense: 15 tablet; Refill: 0  4. Influenza vaccine needed Flu vaccine given today without complication. Patient sat upright for 15 minutes to check for adverse reaction before being released. - Flu vaccine HIGH DOSE PF (Fluzone High dose)       Mar Daring, PA-C  [facility]

## 2016-12-24 NOTE — Progress Notes (Signed)
Patient advised in the room.

## 2016-12-31 ENCOUNTER — Ambulatory Visit (INDEPENDENT_AMBULATORY_CARE_PROVIDER_SITE_OTHER): Payer: PPO | Admitting: Gastroenterology

## 2016-12-31 VITALS — BP 142/81 | HR 99 | Temp 98.2°F | Wt 196.6 lb

## 2016-12-31 DIAGNOSIS — K529 Noninfective gastroenteritis and colitis, unspecified: Secondary | ICD-10-CM | POA: Diagnosis not present

## 2016-12-31 NOTE — Progress Notes (Signed)
[name], MD [address]  [place]  [place], [place] [address]  Main: [phone]  Fax: [phone]    Gastroenterology Consultation  Referring Provider:     Florian Buff* Primary Care Physician:  Mar Daring, PA-C Primary Gastroenterologist:  Dr. [name] Reason for Consultation:     History of chronic diarrhea        HPI:   Donna Malone is a 71 y.o. y/o female referred by Dr. Mar Daring, PA-C  for consultation & management of Metabolic syndrome presents with 2 years of watery, runny diarrhea, occurs immediately after eating about 5-6 times daily. This has spontaneously resolved about 3 weeks ago. Currently, she is having one to 2 formed bowel movements daily. She denies abdominal pain, bloating, nausea, rectal bleeding that were associated with diarrhea. She lost about 20 pounds in last 2-3 years with dietary modification. She denies taking Imodium or any other antidiarrheals. She did not have a colonoscopy or any stool studies for these symptoms. She had a colo-guard test that was negative this year and prior to this she was getting fecal occult blood testing and they were negative. Had cholecystectomy several years ago. She denies any other GI symptoms. She is recently diagnosed with severe B12 deficiency and macrocytosis. She was experiencing severe fatigue. Started on B12 injections and feels significantly better, energy levels are up.  GI Procedures: None  Past Medical History:  Diagnosis Date  . Asthma   . Bone spur    heel  . Depression   . Diabetes mellitus without complication ([place])   . GERD (gastroesophageal reflux disease)   . Gout   . Hypertension   . Thyroid disease     Past Surgical History:  Procedure Laterality Date  . ABDOMINAL HYSTERECTOMY  04/1970   partial  . CHOLECYSTECTOMY  late 1990's    Prior to Admission medications   Medication Sig Start Date End Date Taking? Authorizing Provider    acetaminophen (TYLENOL) 500 MG tablet Take 1,000 mg by mouth every 8 (eight) hours as needed.    [provider]  allopurinol (ZYLOPRIM) 100 MG tablet Take 1 tablet (100 mg total) by mouth daily. 07/09/16   Mar Daring, PA-C  aspirin 81 MG tablet Take 81 mg by mouth daily.  06/21/10   [provider]  atorvastatin (LIPITOR) 40 MG tablet Take 1 tablet by mouth  daily 07/11/16   Mar Daring, PA-C  B Complex Vitamins (GNP VITAMIN B COMPLEX PO) Take by mouth.    [provider]  colchicine 0.6 MG tablet TAKE 1 TABLET BY MOUTH TWICE A DAY 11/23/16   [name] M, PA-C  escitalopram (LEXAPRO) 20 MG tablet Take 1 tablet (20 mg total) by mouth daily. 05/22/16   Mar Daring, PA-C  furosemide (LASIX) 40 MG tablet Take 1 tablet (40 mg total) by mouth daily. 07/12/16   Mar Daring, PA-C  glipiZIDE (GLUCOTROL XL) 5 MG 24 hr tablet Take 1 tablet (5 mg total) by mouth daily with breakfast. 05/28/16   Mar Daring, PA-C  levothyroxine (SYNTHROID, LEVOTHROID) 75 MCG tablet Take 1 tablet by mouth daily 05/02/16   Mar Daring, PA-C  lisinopril (PRINIVIL,ZESTRIL) 5 MG tablet Take 1 tablet by mouth every other day 07/11/16   Mar Daring, PA-C  meloxicam (MOBIC) 7.5 MG tablet Take 1 tablet (7.5 mg total) by mouth daily. 12/24/16   Mar Daring, PA-C  metFORMIN (GLUCOPHAGE)  1000 MG tablet Take 1 tablet (1,000 mg total) by mouth daily with breakfast. 12/24/16   Mar Daring, PA-C  ONE TOUCH ULTRA TEST test strip TEST BLOOD SUGAR ONCE DAILY 11/25/15   Mar Daring, PA-C  potassium chloride (MICRO-K) 10 MEQ CR capsule Take 1 capsule by mouth  daily 07/09/16   Mar Daring, PA-C  ranitidine (ZANTAC) 150 MG tablet Take 1 tablet (150 mg total) by mouth 2 (two) times daily. 07/12/16   Mar Daring, PA-C  traZODone (DESYREL) 100 MG tablet Take 1 tablet by mouth at  bedtime 05/28/16   Mar Daring, PA-C   Vitamin D, Ergocalciferol, (DRISDOL) 50000 units CAPS capsule TAKE 1 CAPSULE (50,000 UNITS TOTAL) BY MOUTH ONCE A WEEK. 12/24/16   Mar Daring, PA-C    Family History  Problem Relation Age of Onset  . CAD Mother   . Heart attack Mother   . Heart disease Father   . Diabetes Cousin      Social History  Substance Use Topics  . Smoking status: Never Smoker  . Smokeless tobacco: Never Used  . Alcohol use No    Allergies as of 12/31/2016 - Review Complete 12/24/2016  Allergen Reaction Noted  . Sulfa antibiotics  09/21/2014    Review of Systems:    All systems reviewed and negative except where noted in HPI.   Physical Exam:  There were no vitals taken for this visit. No LMP recorded. Patient is postmenopausal.  General:   Alert,  Well-developed, well-nourished, pleasant and cooperative in NAD Head:  Normocephalic and atraumatic. Eyes:  Sclera clear, no icterus.   Conjunctiva pink. Ears:  Normal auditory acuity. Nose:  No deformity, discharge, or lesions. Mouth:  No deformity or lesions,oropharynx pink & moist. Neck:  Supple; no masses or thyromegaly. Lungs:  Respirations even and unlabored.  Clear throughout to auscultation.   No wheezes, crackles, or rhonchi. No acute distress. Heart:  Regular rate and rhythm; no murmurs, clicks, rubs, or gallops. Abdomen:  Normal bowel sounds.  No bruits.  Soft, non-tender and non-distended without masses, hepatosplenomegaly or hernias noted.  No guarding or rebound tenderness.   Rectal: Nor performed Msk:  Symmetrical without gross deformities. Good, equal movement & strength bilaterally. Pulses:  Normal pulses noted. Extremities:  No clubbing or edema.  No cyanosis. Neurologic:  Alert and oriented x3;  grossly normal neurologically. Psych:  Alert and cooperative. Normal mood and affect.  Imaging Studies: No abdominal imaging  Assessment and Plan:   Donna Malone is a 71 y.o. female with Obesity, metabolic syndrome,  hypothyroidism who had a two-year history of chronic nonbloody diarrhea spontaneously resolved about 3 weeks ago. She may had functional diarrhea or microscopic colitis or postinfectious IBS. No further workup at this time unless diarrhea recurs. Suggested her to follow up with me if her symptoms recur. She may need a colonoscopy at that time along with stool studies  She also has B12 deficiency, microcytosis, currently being replaced with B12 injections  Follow up as needed   [name], MD

## 2017-01-06 ENCOUNTER — Other Ambulatory Visit: Payer: Self-pay | Admitting: Physician Assistant

## 2017-01-06 DIAGNOSIS — M25552 Pain in left hip: Secondary | ICD-10-CM

## 2017-01-25 ENCOUNTER — Ambulatory Visit: Payer: PPO | Admitting: Gastroenterology

## 2017-01-26 ENCOUNTER — Other Ambulatory Visit: Payer: Self-pay | Admitting: Physician Assistant

## 2017-02-03 ENCOUNTER — Other Ambulatory Visit: Payer: Self-pay | Admitting: Physician Assistant

## 2017-02-03 DIAGNOSIS — M25552 Pain in left hip: Secondary | ICD-10-CM

## 2017-02-22 ENCOUNTER — Other Ambulatory Visit: Payer: Self-pay | Admitting: Physician Assistant

## 2017-02-22 DIAGNOSIS — E038 Other specified hypothyroidism: Secondary | ICD-10-CM

## 2017-02-28 ENCOUNTER — Other Ambulatory Visit: Payer: Self-pay | Admitting: Physician Assistant

## 2017-02-28 DIAGNOSIS — M25552 Pain in left hip: Secondary | ICD-10-CM

## 2017-02-28 MED ORDER — MELOXICAM 7.5 MG PO TABS: 7.5000 mg | ORAL_TABLET | Freq: Every day | ORAL | 1 refills | Status: DC

## 2017-02-28 NOTE — Telephone Encounter (Signed)
[facility] faxed a refill request on the following medications:  meloxicam (MOBIC) 7.5 MG tablet   90 day supply  [facility]

## 2017-02-28 NOTE — Telephone Encounter (Signed)
Requesting qty:90  Last written:11/05 with a Qty of 30

## 2017-03-13 ENCOUNTER — Other Ambulatory Visit: Payer: Self-pay | Admitting: Physician Assistant

## 2017-03-13 DIAGNOSIS — E559 Vitamin D deficiency, unspecified: Secondary | ICD-10-CM

## 2017-03-13 MED ORDER — VITAMIN D (ERGOCALCIFEROL) 1.25 MG (50000 UNIT) PO CAPS: 50000.0000 [IU] | ORAL_CAPSULE | ORAL | 1 refills | Status: DC

## 2017-03-13 NOTE — Telephone Encounter (Signed)
[facility] faxed refill request for following medications:Vitamin D, Ergocalciferol, (DRISDOL) 50000 units CAPS capsule     Please advise,Thanks [place]

## 2017-03-18 ENCOUNTER — Telehealth: Payer: Self-pay | Admitting: Physician Assistant

## 2017-03-18 DIAGNOSIS — E7800 Pure hypercholesterolemia, unspecified: Secondary | ICD-10-CM

## 2017-03-18 NOTE — Telephone Encounter (Signed)
[facility] faxed refill request for the following medications:  atorvastatin (LIPITOR) 40 MG tablet   90 day supply  Last Rx: 07/11/16 with refills to [facility]: 12/24/16 Please advise. Thanks TNP

## 2017-03-19 MED ORDER — ATORVASTATIN CALCIUM 40 MG PO TABS: ORAL_TABLET | ORAL | 3 refills | Status: DC

## 2017-03-19 NOTE — Telephone Encounter (Signed)
refilled 

## 2017-04-04 ENCOUNTER — Ambulatory Visit (INDEPENDENT_AMBULATORY_CARE_PROVIDER_SITE_OTHER): Payer: PPO

## 2017-04-04 VITALS — BP 126/70 | HR 80 | Temp 99.0°F | Ht 64.0 in | Wt 196.8 lb

## 2017-04-04 DIAGNOSIS — Z0000 Encounter for general adult medical examination without abnormal findings: Secondary | ICD-10-CM

## 2017-04-04 NOTE — Patient Instructions (Signed)
Donna Malone , Thank you for taking time to come for your Medicare Wellness Visit. I appreciate your ongoing commitment to your health goals. Please review the following plan we discussed and let me know if I can assist you in the future.   Screening recommendations/referrals: Colonoscopy: Completed cologuard on 12/09/14, due again on 12/08/17. Mammogram:  Pt to set up mammogram this year. Bone Density: Up to date Recommended yearly ophthalmology/optometry visit for glaucoma screening and checkup Recommended yearly dental visit for hygiene and checkup  Vaccinations: Influenza vaccine: Up to date Pneumococcal vaccine: Up to date Tdap vaccine: Up to date Shingles vaccine: Pt declines today.     Advanced directives: Advance directive discussed with you today. Even though you declined this today please call our office should you change your mind and we can give you the proper paperwork for you to fill out.  Conditions/risks identified: Obesity- recommend to start back with water aerobics for 3 days a week for 55 minutes.   Next appointment: 04/09/17 @ 9:00 AM   Preventive Care 65 Years and Older, Female Preventive care refers to lifestyle choices and visits with your health care provider that can promote health and wellness. What does preventive care include?  A yearly physical exam. This is also called an annual well check.  Dental exams once or twice a year.  Routine eye exams. Ask your health care provider how often you should have your eyes checked.  Personal lifestyle choices, including:  Daily care of your teeth and gums.  Regular physical activity.  Eating a healthy diet.  Avoiding tobacco and drug use.  Limiting alcohol use.  Practicing safe sex.  Taking low-dose aspirin every day.  Taking vitamin and mineral supplements as recommended by your health care provider. What happens during an annual well check? The services and screenings done by your health care  provider during your annual well check will depend on your age, overall health, lifestyle risk factors, and family history of disease. Counseling  Your health care provider may ask you questions about your:  Alcohol use.  Tobacco use.  Drug use.  Emotional well-being.  Home and relationship well-being.  Sexual activity.  Eating habits.  History of falls.  Memory and ability to understand (cognition).  Work and work Statistician.  Reproductive health. Screening  You may have the following tests or measurements:  Height, weight, and BMI.  Blood pressure.  Lipid and cholesterol levels. These may be checked every 5 years, or more frequently if you are over 77 years old.  Skin check.  Lung cancer screening. You may have this screening every year starting at age 45 if you have a 30-pack-year history of smoking and currently smoke or have quit within the past 15 years.  Fecal occult blood test (FOBT) of the stool. You may have this test every year starting at age 3.  Flexible sigmoidoscopy or colonoscopy. You may have a sigmoidoscopy every 5 years or a colonoscopy every 10 years starting at age 51.  Hepatitis C blood test.  Hepatitis B blood test.  Sexually transmitted disease (STD) testing.  Diabetes screening. This is done by checking your blood sugar (glucose) after you have not eaten for a while (fasting). You may have this done every 1-3 years.  Bone density scan. This is done to screen for osteoporosis. You may have this done starting at age 77.  Mammogram. This may be done every 1-2 years. Talk to your health care provider about how often you should have  regular mammograms. Talk with your health care provider about your test results, treatment options, and if necessary, the need for more tests. Vaccines  Your health care provider may recommend certain vaccines, such as:  Influenza vaccine. This is recommended every year.  Tetanus, diphtheria, and acellular  pertussis (Tdap, Td) vaccine. You may need a Td booster every 10 years.  Zoster vaccine. You may need this after age 38.  Pneumococcal 13-valent conjugate (PCV13) vaccine. One dose is recommended after age 78.  Pneumococcal polysaccharide (PPSV23) vaccine. One dose is recommended after age 36. Talk to your health care provider about which screenings and vaccines you need and how often you need them. This information is not intended to replace advice given to you by your health care provider. Make sure you discuss any questions you have with your health care provider. Document Released: 04/15/2015 Document Revised: 12/07/2015 Document Reviewed: 01/18/2015 Elsevier Interactive Patient Education  2017 [place] Prevention in the Home Falls can cause injuries. They can happen to people of all ages. There are many things you can do to make your home safe and to help prevent falls. What can I do on the outside of my home?  Regularly fix the edges of walkways and driveways and fix any cracks.  Remove anything that might make you trip as you walk through a door, such as a raised step or threshold.  Trim any bushes or trees on the path to your home.  Use bright outdoor lighting.  Clear any walking paths of anything that might make someone trip, such as rocks or tools.  Regularly check to see if handrails are loose or broken. Make sure that both sides of any steps have handrails.  Any raised decks and porches should have guardrails on the edges.  Have any leaves, snow, or ice cleared regularly.  Use sand or salt on walking paths during winter.  Clean up any spills in your garage right away. This includes oil or grease spills. What can I do in the bathroom?  Use night lights.  Install grab bars by the toilet and in the tub and shower. Do not use towel bars as grab bars.  Use non-skid mats or decals in the tub or shower.  If you need to sit down in the shower, use a plastic,  non-slip stool.  Keep the floor dry. Clean up any water that spills on the floor as soon as it happens.  Remove soap buildup in the tub or shower regularly.  Attach bath mats securely with double-sided non-slip rug tape.  Do not have throw rugs and other things on the floor that can make you trip. What can I do in the bedroom?  Use night lights.  Make sure that you have a light by your bed that is easy to reach.  Do not use any sheets or blankets that are too big for your bed. They should not hang down onto the floor.  Have a firm chair that has side arms. You can use this for support while you get dressed.  Do not have throw rugs and other things on the floor that can make you trip. What can I do in the kitchen?  Clean up any spills right away.  Avoid walking on wet floors.  Keep items that you use a lot in easy-to-reach places.  If you need to reach something above you, use a strong step stool that has a grab bar.  Keep electrical cords out of the  way.  Do not use floor polish or wax that makes floors slippery. If you must use wax, use non-skid floor wax.  Do not have throw rugs and other things on the floor that can make you trip. What can I do with my stairs?  Do not leave any items on the stairs.  Make sure that there are handrails on both sides of the stairs and use them. Fix handrails that are broken or loose. Make sure that handrails are as long as the stairways.  Check any carpeting to make sure that it is firmly attached to the stairs. Fix any carpet that is loose or worn.  Avoid having throw rugs at the top or bottom of the stairs. If you do have throw rugs, attach them to the floor with carpet tape.  Make sure that you have a light switch at the top of the stairs and the bottom of the stairs. If you do not have them, ask someone to add them for you. What else can I do to help prevent falls?  Wear shoes that:  Do not have high heels.  Have rubber  bottoms.  Are comfortable and fit you well.  Are closed at the toe. Do not wear sandals.  If you use a stepladder:  Make sure that it is fully opened. Do not climb a closed stepladder.  Make sure that both sides of the stepladder are locked into place.  Ask someone to hold it for you, if possible.  Clearly mark and make sure that you can see:  Any grab bars or handrails.  First and last steps.  Where the edge of each step is.  Use tools that help you move around (mobility aids) if they are needed. These include:  Canes.  Walkers.  Scooters.  Crutches.  Turn on the lights when you go into a dark area. Replace any light bulbs as soon as they burn out.  Set up your furniture so you have a clear path. Avoid moving your furniture around.  If any of your floors are uneven, fix them.  If there are any pets around you, be aware of where they are.  Review your medicines with your doctor. Some medicines can make you feel dizzy. This can increase your chance of falling. Ask your doctor what other things that you can do to help prevent falls. This information is not intended to replace advice given to you by your health care provider. Make sure you discuss any questions you have with your health care provider. Document Released: 01/13/2009 Document Revised: 08/25/2015 Document Reviewed: 04/23/2014 Elsevier Interactive Patient Education  2017 [employer].

## 2017-04-04 NOTE — Progress Notes (Signed)
Subjective:   Donna Malone is a 72 y.o. female who presents for Medicare Annual (Subsequent) preventive examination.  Review of Systems:  N/A  Cardiac Risk Factors include: advanced age (>38men, >53 women);diabetes mellitus;dyslipidemia;hypertension;obesity (BMI >30kg/m2)     Objective:     Vitals: BP 126/70 (BP Location: Left Arm)   Pulse 80   Temp 99 F (37.2 C) (Oral)   Ht 5\' 4"  (1.626 m)   Wt 196 lb 12.8 oz (89.3 kg)   BMI 33.78 kg/m   Body mass index is 33.78 kg/m.  Advanced Directives 04/04/2017 03/20/2016 10/29/2015 02/09/2015 11/08/2014  Does Patient Have a Medical Advance Directive? No Yes;No No No Yes  Type of Advance Directive - - - - Press photographer  Does patient want to make changes to medical advance directive? - No - Patient declined - - -  Would patient like information on creating a medical advance directive? No - Patient declined - No - patient declined information - -    Tobacco Social History   Tobacco Use  Smoking Status Never Smoker  Smokeless Tobacco Never Used     Counseling given: Not Answered   Clinical Intake:  Pre-visit preparation completed: Yes  Pain : 0-10 Pain Score: 5  Pain Type: Chronic pain Pain Location: Hip Pain Orientation: Left Pain Descriptors / Indicators: Sore Pain Onset: More than a month ago Pain Frequency: Constant     Nutritional Status: BMI > 30  Obese Nutritional Risks: None Diabetes: Yes CBG done?: No Did pt. bring in CBG monitor from home?: No  How often do you need to have someone help you when you read instructions, pamphlets, or other written materials from your doctor or pharmacy?: 1 - Never  Interpreter Needed?: No  Information entered by :: [facility], LPN  Past Medical History:  Diagnosis Date  . Asthma   . Bone spur    heel  . Depression   . Diabetes mellitus without complication ([place])   . GERD (gastroesophageal reflux disease)   . Gout   . Hypertension   . Thyroid  disease    Past Surgical History:  Procedure Laterality Date  . ABDOMINAL HYSTERECTOMY  04/1970   partial  . CHOLECYSTECTOMY  late 1990's   Family History  Problem Relation Age of Onset  . CAD Mother   . Heart attack Mother   . Heart disease Father   . Diabetes Cousin    Social History   Socioeconomic History  . Marital status: Married    Spouse name: Donna Malone  . Number of children: 3  . Years of education: None  . Highest education level: 12th grade  Social Needs  . Financial resource strain: Not hard at all  . Food insecurity - worry: Never true  . Food insecurity - inability: Never true  . Transportation needs - medical: No  . Transportation needs - non-medical: No  Occupational History  . Occupation: retired  Tobacco Use  . Smoking status: Never Smoker  . Smokeless tobacco: Never Used  Substance and Sexual Activity  . Alcohol use: No  . Drug use: No  . Sexual activity: None  Other Topics Concern  . None  Social History Narrative  . None    Outpatient Encounter Medications as of 04/04/2017  Medication Sig  . allopurinol (ZYLOPRIM) 100 MG tablet Take 1 tablet (100 mg total) by mouth daily.  Marland Kitchen aspirin 81 MG tablet Take 81 mg by mouth daily.   Marland Kitchen atorvastatin (LIPITOR) 40 MG  tablet Take 1 tablet by mouth  daily  . B Complex Vitamins (GNP VITAMIN B COMPLEX PO) Take by mouth daily.   . colchicine 0.6 MG tablet TAKE 1 TABLET BY MOUTH TWICE A DAY (Patient taking differently: TAKE 1 TABLET BY MOUTH TWICE A DAY as needed)  . escitalopram (LEXAPRO) 20 MG tablet Take 1 tablet (20 mg total) by mouth daily.  . furosemide (LASIX) 40 MG tablet Take 1 tablet (40 mg total) by mouth daily.  Marland Kitchen glipiZIDE (GLUCOTROL XL) 5 MG 24 hr tablet Take 1 tablet (5 mg total) by mouth daily with breakfast.  . levothyroxine (SYNTHROID, LEVOTHROID) 75 MCG tablet Take 1 tablet by mouth every day  . lisinopril (PRINIVIL,ZESTRIL) 5 MG tablet Take 1 tablet by mouth every other day (Patient taking  differently: 2.5 mg. Take 1 tablet by mouth every other day)  . metFORMIN (GLUCOPHAGE) 1000 MG tablet Take 1 tablet (1,000 mg total) by mouth daily with breakfast.  . ONE TOUCH ULTRA TEST test strip TEST BLOOD SUGAR ONCE DAILY  . potassium chloride (MICRO-K) 10 MEQ CR capsule Take 1 capsule by mouth  daily  . ranitidine (ZANTAC) 150 MG tablet Take 1 tablet (150 mg total) by mouth 2 (two) times daily.  . traZODone (DESYREL) 100 MG tablet Take 1 tablet by mouth at  bedtime  . Vitamin D, Ergocalciferol, (DRISDOL) 50000 units CAPS capsule Take 1 capsule (50,000 Units total) by mouth once a week.  . [DISCONTINUED] meloxicam (MOBIC) 7.5 MG tablet Take 1 tablet (7.5 mg total) by mouth daily. (Patient not taking: Reported on 04/04/2017)   No facility-administered encounter medications on file as of 04/04/2017.     Activities of Daily Living In your present state of health, do you have any difficulty performing the following activities: 04/04/2017 11/20/2016  Hearing? N N  Vision? N N  Difficulty concentrating or making decisions? Y N  Comment occasional forgetfulness  -  Walking or climbing stairs? Y Y  Comment due to hip pain -  Dressing or bathing? N N  Doing errands, shopping? N N  Preparing Food and eating ? N -  Using the Toilet? N -  In the past six months, have you accidently leaked urine? N -  Do you have problems with loss of bowel control? N -  Managing your Medications? N -  Managing your Finances? N -  Housekeeping or managing your Housekeeping? N -  Some recent data might be hidden    Patient Care Team: Mar Daring, PA-C as PCP - General (Family Medicine) Lorelee Cover., MD as Consulting Physician (Ophthalmology)    Assessment:   This is a routine wellness examination for Donna Malone.  Exercise Activities and Dietary recommendations Current Exercise Habits: The patient does not participate in regular exercise at present, Exercise limited by: orthopedic  condition(s)  Goals    . Exercise 150 minutes per week (moderate activity)     Recommend to start back with water aerobics for 3 days a week for 55 minutes.     . Peak Blood Glucose < 180       Fall Risk Fall Risk  04/04/2017 11/20/2016 03/20/2016 11/08/2014  Falls in the past year? No No No No   Is the patient's home free of loose throw rugs in walkways, pet beds, electrical cords, etc?   yes      Grab bars in the bathroom? No, pt does not feel bars are neccesary at this time.  Handrails on the stairs?   yes      Adequate lighting?   yes  Timed Get Up and Go performed: N/A  Depression Screen PHQ 2/9 Scores 04/04/2017 11/20/2016 11/20/2016 03/20/2016  PHQ - 2 Score 1 0 0 1  PHQ- 9 Score - 8 - -     Cognitive Function: Pt declined screening today.     6CIT Screen 03/20/2016  What Year? 0 points  What month? 0 points  What time? 0 points  Count back from 20 0 points  Months in reverse 0 points  Repeat phrase 2 points  Total Score 2    Immunization History  Administered Date(s) Administered  . Influenza Split 12/16/2009, 03/21/2011, 01/30/2012  . Influenza, High Dose Seasonal PF 02/09/2015, 03/20/2016, 12/24/2016  . Influenza,inj,Quad PF,6+ Mos 02/06/2013, 12/03/2013  . Pneumococcal Conjugate-13 12/03/2013  . Pneumococcal Polysaccharide-23 03/21/2011  . Tdap 04/09/2008    Qualifies for Shingles Vaccine? Due for Shingles vaccine. Declined my offer to administer today. Education has been provided regarding the importance of this vaccine. Pt has been advised to call her insurance company to determine her out of pocket expense. Advised she may also receive this vaccine at her local pharmacy or Health Dept. Verbalized acceptance and understanding.  Screening Tests Health Maintenance  Topic Date Due  . OPHTHALMOLOGY EXAM  03/01/2017  . MAMMOGRAM  03/19/2017  . FOOT EXAM  05/23/2017  . HEMOGLOBIN A1C  05/23/2017  . Fecal DNA (Cologuard)  12/08/2017  . TETANUS/TDAP   04/09/2018  . INFLUENZA VACCINE  Completed  . DEXA SCAN  Completed  . Hepatitis C Screening  Completed  . PNA vac Low Risk Adult  Completed    Cancer Screenings: Lung: Low Dose CT Chest recommended if Age 41-80 years, 30 pack-year currently smoking OR have quit w/in 15years. Patient does not qualify. Breast:  Up to date on Mammogram? No, pt to set up mammogram this year. Up to date of Bone Density/Dexa? Yes Colorectal: Completed cologuard on 12/09/14, due again on 12/08/17.  Additional Screenings:  Hepatitis B/HIV/Syphillis: Pt declines today.  Hepatitis C Screening: Up to date    Plan:  I have personally reviewed and addressed the Medicare Annual Wellness questionnaire and have noted the following in the patient's chart:  A. Medical and social history B. Use of alcohol, tobacco or illicit drugs  C. Current medications and supplements D. Functional ability and status E.  Nutritional status F.  Physical activity G. Advance directives H. List of other physicians I.  Hospitalizations, surgeries, and ER visits in previous 12 months J.  [place] such as hearing and vision if needed, cognitive and depression L. Referrals and appointments - none  In addition, I have reviewed and discussed with patient certain preventive protocols, quality metrics, and best practice recommendations. A written personalized care plan for preventive services as well as general preventive health recommendations were provided to patient.  See attached scanned questionnaire for additional information.   Signed,  Fabio Neighbors, LPN Nurse Health Advisor   Nurse Recommendations: Pt to set up a mammogram and eye exam this year. Please f/u on hip pain at next OV on 04/09/17.

## 2017-04-09 ENCOUNTER — Encounter: Payer: Self-pay | Admitting: Physician Assistant

## 2017-04-09 ENCOUNTER — Ambulatory Visit (INDEPENDENT_AMBULATORY_CARE_PROVIDER_SITE_OTHER): Payer: PPO | Admitting: Physician Assistant

## 2017-04-09 VITALS — BP 100/60 | HR 72 | Temp 98.2°F | Ht 64.0 in | Wt 195.0 lb

## 2017-04-09 DIAGNOSIS — N183 Chronic kidney disease, stage 3 unspecified: Secondary | ICD-10-CM

## 2017-04-09 DIAGNOSIS — F5101 Primary insomnia: Secondary | ICD-10-CM | POA: Diagnosis not present

## 2017-04-09 DIAGNOSIS — E559 Vitamin D deficiency, unspecified: Secondary | ICD-10-CM | POA: Diagnosis not present

## 2017-04-09 DIAGNOSIS — I10 Essential (primary) hypertension: Secondary | ICD-10-CM

## 2017-04-09 DIAGNOSIS — E119 Type 2 diabetes mellitus without complications: Secondary | ICD-10-CM | POA: Diagnosis not present

## 2017-04-09 DIAGNOSIS — I130 Hypertensive heart and chronic kidney disease with heart failure and stage 1 through stage 4 chronic kidney disease, or unspecified chronic kidney disease: Secondary | ICD-10-CM | POA: Diagnosis not present

## 2017-04-09 DIAGNOSIS — R300 Dysuria: Secondary | ICD-10-CM | POA: Diagnosis not present

## 2017-04-09 DIAGNOSIS — Z0000 Encounter for general adult medical examination without abnormal findings: Secondary | ICD-10-CM

## 2017-04-09 DIAGNOSIS — Z1239 Encounter for other screening for malignant neoplasm of breast: Secondary | ICD-10-CM

## 2017-04-09 DIAGNOSIS — M10471 Other secondary gout, right ankle and foot: Secondary | ICD-10-CM | POA: Diagnosis not present

## 2017-04-09 DIAGNOSIS — E039 Hypothyroidism, unspecified: Secondary | ICD-10-CM

## 2017-04-09 DIAGNOSIS — F3342 Major depressive disorder, recurrent, in full remission: Secondary | ICD-10-CM

## 2017-04-09 DIAGNOSIS — Z1211 Encounter for screening for malignant neoplasm of colon: Secondary | ICD-10-CM

## 2017-04-09 DIAGNOSIS — M25552 Pain in left hip: Secondary | ICD-10-CM | POA: Diagnosis not present

## 2017-04-09 DIAGNOSIS — E7800 Pure hypercholesterolemia, unspecified: Secondary | ICD-10-CM

## 2017-04-09 LAB — POCT URINALYSIS DIPSTICK
Bilirubin, UA: NEGATIVE
Glucose, UA: NEGATIVE
Ketones, UA: NEGATIVE
NITRITE UA: NEGATIVE
PROTEIN UA: NEGATIVE
RBC UA: NEGATIVE
SPEC GRAV UA: 1.01 (ref 1.010–1.025)
UROBILINOGEN UA: 0.2 U/dL
pH, UA: 6 (ref 5.0–8.0)

## 2017-04-09 LAB — POCT UA - MICROALBUMIN: Microalbumin Ur, POC: 20 mg/L

## 2017-04-09 MED ORDER — TRAZODONE HCL 100 MG PO TABS: ORAL_TABLET | ORAL | 3 refills | Status: DC

## 2017-04-09 MED ORDER — ESCITALOPRAM OXALATE 20 MG PO TABS: 20.0000 mg | ORAL_TABLET | Freq: Every day | ORAL | 3 refills | Status: DC

## 2017-04-09 MED ORDER — NITROFURANTOIN MONOHYD MACRO 100 MG PO CAPS: 100.0000 mg | ORAL_CAPSULE | Freq: Two times a day (BID) | ORAL | 0 refills | Status: DC

## 2017-04-09 MED ORDER — GLIPIZIDE ER 5 MG PO TB24: 5.0000 mg | ORAL_TABLET | Freq: Every day | ORAL | 3 refills | Status: DC

## 2017-04-09 NOTE — Addendum Note (Signed)
Addended by: Shawna Orleans on: 04/09/2017 04:49 PM   Modules accepted: Orders

## 2017-04-09 NOTE — Progress Notes (Signed)
Patient: Donna Malone, Female    DOB: 1946/02/27, 72 y.o.   MRN: [id number] Visit Date: 04/09/2017  Today's Provider: Mar Daring, PA-C   Chief Complaint  Patient presents with  . Medicare Wellness   Subjective:    Annual wellness visit Donna Malone is a 72 y.o. female. She feels fairly well. She reports exercising active with daily activites. She reports she is sleeping well.  03/18/16 AWE 03/18/16 Mammogram-BI-RADS 2 05/08/16 BMD-Osteopenia -----------------------------------------------------------   Review of Systems  Constitutional: Positive for chills and fatigue.  HENT: Negative.   Eyes: Negative.   Respiratory: Positive for shortness of breath.   Cardiovascular: Negative.   Gastrointestinal: Negative.   Endocrine: Negative.   Genitourinary: Positive for dysuria and vaginal pain.  Musculoskeletal: Positive for arthralgias and back pain.  Skin: Negative.   Allergic/Immunologic: Negative.   Neurological: Negative.   Hematological: Negative.   Psychiatric/Behavioral: Negative.     Social History   Socioeconomic History  . Marital status: Married    Spouse name: [name]  . Number of children: 3  . Years of education: Not on file  . Highest education level: 12th grade  Social Needs  . Financial resource strain: Not hard at all  . Food insecurity - worry: Never true  . Food insecurity - inability: Never true  . Transportation needs - medical: No  . Transportation needs - non-medical: No  Occupational History  . Occupation: retired  Tobacco Use  . Smoking status: Never Smoker  . Smokeless tobacco: Never Used  Substance and Sexual Activity  . Alcohol use: No  . Drug use: No  . Sexual activity: Not on file  Other Topics Concern  . Not on file  Social History Narrative  . Not on file    Past Medical History:  Diagnosis Date  . Asthma   . Bone spur    heel  . Depression   . Diabetes mellitus without complication ([place])     . GERD (gastroesophageal reflux disease)   . Gout   . Hypertension   . Thyroid disease      Patient Active Problem List   Diagnosis Date Noted  . Bronchitis 09/19/2015  . Hypertension 04/01/2015  . Abnormal finding on mammography 09/21/2014  . Hypothyroidism 09/21/2014  . Adult body mass index 37.0-37.9 09/21/2014  . Chronic kidney disease, stage III (moderate) ([place]) 09/21/2014  . Type II diabetes mellitus ([place]) 09/21/2014  . Depressive disorder 09/21/2014  . Accumulation of fluid in tissues 09/21/2014  . Esophageal reflux 09/21/2014  . Gout 09/21/2014  . Hypercholesteremia 09/21/2014  . Heart & renal disease, hypertensive, with heart failure ([place]) 09/21/2014  . Insomnia 09/21/2014  . Vitamin D deficiency, unspecified 09/21/2014  . Colon cancer screening 01/20/2014  . Low back pain 01/15/2014  . Other chest pain 01/15/2014  . Atrophic vaginitis 01/15/2014  . Hypertensive chronic kidney disease 01/15/2014    Past Surgical History:  Procedure Laterality Date  . ABDOMINAL HYSTERECTOMY  04/1970   partial  . CHOLECYSTECTOMY  late 1990's    Her family history includes CAD in her mother; Diabetes in her cousin; Heart attack in her mother; Heart disease in her father.      Current Outpatient Medications:  .  allopurinol (ZYLOPRIM) 100 MG tablet, Take 1 tablet (100 mg total) by mouth daily., Disp: 90 tablet, Rfl: 3 .  aspirin 81 MG tablet, Take 81 mg by mouth daily. , Disp: , Rfl:  .  atorvastatin (LIPITOR)  40 MG tablet, Take 1 tablet by mouth  daily, Disp: 90 tablet, Rfl: 3 .  B Complex Vitamins (GNP VITAMIN B COMPLEX PO), Take by mouth daily. , Disp: , Rfl:  .  colchicine 0.6 MG tablet, TAKE 1 TABLET BY MOUTH TWICE A DAY (Patient taking differently: TAKE 1 TABLET BY MOUTH TWICE A DAY as needed), Disp: 60 tablet, Rfl: 5 .  escitalopram (LEXAPRO) 20 MG tablet, Take 1 tablet (20 mg total) by mouth daily., Disp: 90 tablet, Rfl: 3 .  furosemide (LASIX) 40 MG tablet, Take 1 tablet  (40 mg total) by mouth daily., Disp: 90 tablet, Rfl: 3 .  glipiZIDE (GLUCOTROL XL) 5 MG 24 hr tablet, Take 1 tablet (5 mg total) by mouth daily with breakfast., Disp: 90 tablet, Rfl: 3 .  levothyroxine (SYNTHROID, LEVOTHROID) 75 MCG tablet, Take 1 tablet by mouth every day, Disp: 90 tablet, Rfl: 1 .  lisinopril (PRINIVIL,ZESTRIL) 5 MG tablet, Take 1 tablet by mouth every other day (Patient taking differently: 5 mg. Take 1 tablet by mouth every other day), Disp: 90 tablet, Rfl: 3 .  metFORMIN (GLUCOPHAGE) 1000 MG tablet, Take 1 tablet (1,000 mg total) by mouth daily with breakfast., Disp: 180 tablet, Rfl: 2 .  ONE TOUCH ULTRA TEST test strip, TEST BLOOD SUGAR ONCE DAILY, Disp: 100 each, Rfl: 4 .  potassium chloride (MICRO-K) 10 MEQ CR capsule, Take 1 capsule by mouth  daily, Disp: 90 capsule, Rfl: 3 .  ranitidine (ZANTAC) 150 MG tablet, Take 1 tablet (150 mg total) by mouth 2 (two) times daily., Disp: 180 tablet, Rfl: 3 .  traZODone (DESYREL) 100 MG tablet, Take 1 tablet by mouth at  bedtime, Disp: 90 tablet, Rfl: 3 .  Vitamin D, Ergocalciferol, (DRISDOL) 50000 units CAPS capsule, Take 1 capsule (50,000 Units total) by mouth once a week., Disp: 12 capsule, Rfl: 1  Patient Care Team: Mar Daring, PA-C as PCP - General (Family Medicine) Lorelee Cover., MD as Consulting Physician (Ophthalmology)     Objective:   Vitals: BP 100/60 (BP Location: Left Arm, Patient Position: Sitting, Cuff Size: Large)   Pulse 72   Temp 98.2 F (36.8 C) (Oral)   Ht 5\' 4"  (1.626 m)   Wt 195 lb (88.5 kg)   SpO2 96%   BMI 33.47 kg/m   Physical Exam  Activities of Daily Living In your present state of health, do you have any difficulty performing the following activities: 04/04/2017 11/20/2016  Hearing? N N  Vision? N N  Difficulty concentrating or making decisions? Y N  Comment occasional forgetfulness  -  Walking or climbing stairs? Y Y  Comment due to hip pain -  Dressing or bathing? N N  Doing  errands, shopping? N N  Preparing Food and eating ? N -  Using the Toilet? N -  In the past six months, have you accidently leaked urine? N -  Do you have problems with loss of bowel control? N -  Managing your Medications? N -  Managing your Finances? N -  Housekeeping or managing your Housekeeping? N -  Some recent data might be hidden    Fall Risk Assessment Fall Risk  04/04/2017 11/20/2016 03/20/2016 11/08/2014  Falls in the past year? No No No No     Depression Screen PHQ 2/9 Scores 04/04/2017 11/20/2016 11/20/2016 03/20/2016  PHQ - 2 Score 1 0 0 1  PHQ- 9 Score - 8 - -    Cognitive Testing - 6-CIT  Correct?  Score   What year is it? yes 0 0 or 4  What month is it? yes 0 0 or 3  Memorize:    Pia Mau,  40,  [address],  [place],      What time is it? (within 1 hour) yes 0 0 or 3  Count backwards from 20 yes 0 0, 2, or 4  Name the months of the year yes 0 0, 2, or 4  Repeat name & address above no 2 0, 2, 4, 6, 8, or 10       TOTAL SCORE  2/28   Interpretation:  Normal  Normal (0-7) Abnormal (8-28)       Assessment & Plan:     Annual Wellness Visit  Reviewed patient's Family Medical History Reviewed and updated list of patient's medical providers Assessment of cognitive impairment was done Assessed patient's functional ability Established a written schedule for health screening [place] Completed and Reviewed  Exercise Activities and Dietary recommendations Goals    . Exercise 150 minutes per week (moderate activity)     Recommend to start back with water aerobics for 3 days a week for 55 minutes.     . Peak Blood Glucose < 180       Immunization History  Administered Date(s) Administered  . Influenza Split 12/16/2009, 03/21/2011, 01/30/2012  . Influenza, High Dose Seasonal PF 02/09/2015, 03/20/2016, 12/24/2016  . Influenza,inj,Quad PF,6+ Mos 02/06/2013, 12/03/2013  . Pneumococcal Conjugate-13 12/03/2013  . Pneumococcal  Polysaccharide-23 03/21/2011  . Tdap 04/09/2008    Health Maintenance  Topic Date Due  . OPHTHALMOLOGY EXAM  03/01/2017  . MAMMOGRAM  03/19/2017  . FOOT EXAM  05/23/2017  . HEMOGLOBIN A1C  05/23/2017  . Fecal DNA (Cologuard)  12/08/2017  . TETANUS/TDAP  04/09/2018  . INFLUENZA VACCINE  Completed  . DEXA SCAN  Completed  . Hepatitis C Screening  Completed  . PNA vac Low Risk Adult  Completed     Discussed health benefits of physical activity, and encouraged her to engage in regular exercise appropriate for her age and condition.    1. Annual physical exam Normal exam today. Up to date on all vaccinations. Patient is to call for eye exam.   2. Breast cancer screening No abnormalities. Patient does self breast exams. She is to call [facility] to schedule.   3. Colon cancer screening Due for cologuard retesting in September of 2019. Will order at follow up.   4. Heart & renal disease, hypertensive, with heart failure (HCC) Previously stable. Will check labs as below and f/u pending results. - CBC w/Diff/Platelet - Comprehensive Metabolic Panel (CMET) - HgB A1c  5. Essential hypertension Stable. Will check labs as below and f/u pending results. - CBC w/Diff/Platelet - Comprehensive Metabolic Panel (CMET) - HgB A1c  6. Acquired hypothyroidism Stable on levothyroxine 29mcg. Will check labs as below and f/u pending results. - TSH  7. Type 2 diabetes mellitus without complication, without long-term current use of insulin (HCC) Stable. Continue metformin and gipizide. Will check labs as below and f/u pending results. - Comprehensive Metabolic Panel (CMET) - HgB A1c - glipiZIDE (GLUCOTROL XL) 5 MG 24 hr tablet; Take 1 tablet (5 mg total) by mouth daily with breakfast.  Dispense: 90 tablet; Refill: 3  8. Chronic kidney disease, stage III (moderate) (HCC) Stable. Will check labs as below and f/u pending results. - Comprehensive Metabolic Panel (CMET) - HgB A1c  9.  Hypercholesteremia Stable. Will check labs  as below and f/u pending results. - Comprehensive Metabolic Panel (CMET) - HgB A1c  10. Vitamin D deficiency, unspecified Stable on Vit D high dose. Will check labs as below and f/u pending results. - Vitamin D (25 hydroxy)  11. Dysuria Complains of dysuria. Unable to urinate today. Cup given for patient to collect at home and bring back. Will empirically treat with macrobid as below. If patient has symptoms return or not resolve will return for pelvic exam as may be atrophic vaginitis vs cystocele or both.  - nitrofurantoin, macrocrystal-monohydrate, (MACROBID) 100 MG capsule; Take 1 capsule (100 mg total) by mouth 2 (two) times daily.  Dispense: 10 capsule; Refill: 0  12. Other secondary acute gout of right foot No current flares. Stable on allopurinol, colchicine for prn use.   13. Recurrent major depressive disorder, in full remission ([place]) Stable. Diagnosis pulled for medication refill. Continue current medical treatment plan. - escitalopram (LEXAPRO) 20 MG tablet; Take 1 tablet (20 mg total) by mouth daily.  Dispense: 90 tablet; Refill: 3  14. Primary insomnia Stable. Diagnosis pulled for medication refill. Continue current medical treatment plan. - traZODone (DESYREL) 100 MG tablet; Take 1 tablet by mouth at  bedtime  Dispense: 90 tablet; Refill: 3  15. Left hip pain Worsening. Interested in referral for cortisone injection into hip. Imaging done on 12/24/16 showed no bony abnormality.  Declines PT.  - Ambulatory referral to Orthopedic Surgery  ------------------------------------------------------------------------------------------------------------    Mar Daring, PA-C  [facility]

## 2017-04-09 NOTE — Patient Instructions (Signed)
Health Maintenance for Postmenopausal Women Menopause is a normal process in which your reproductive ability comes to an end. This process happens gradually over a span of months to years, usually between the ages of 22 and 9. Menopause is complete when you have missed 12 consecutive menstrual periods. It is important to talk with your health care provider about some of the most common conditions that affect postmenopausal women, such as heart disease, cancer, and bone loss (osteoporosis). Adopting a healthy lifestyle and getting preventive care can help to promote your health and wellness. Those actions can also lower your chances of developing some of these common conditions. What should I know about menopause? During menopause, you may experience a number of symptoms, such as:  Moderate-to-severe hot flashes.  Night sweats.  Decrease in sex drive.  Mood swings.  Headaches.  Tiredness.  Irritability.  Memory problems.  Insomnia.  Choosing to treat or not to treat menopausal changes is an individual decision that you make with your health care provider. What should I know about hormone replacement therapy and supplements? Hormone therapy products are effective for treating symptoms that are associated with menopause, such as hot flashes and night sweats. Hormone replacement carries certain risks, especially as you become older. If you are thinking about using estrogen or estrogen with progestin treatments, discuss the benefits and risks with your health care provider. What should I know about heart disease and stroke? Heart disease, heart attack, and stroke become more likely as you age. This may be due, in part, to the hormonal changes that your body experiences during menopause. These can affect how your body processes dietary fats, triglycerides, and cholesterol. Heart attack and stroke are both medical emergencies. There are many things that you can do to help prevent heart disease  and stroke:  Have your blood pressure checked at least every 1-2 years. High blood pressure causes heart disease and increases the risk of stroke.  If you are 53-22 years old, ask your health care provider if you should take aspirin to prevent a heart attack or a stroke.  Do not use any tobacco products, including cigarettes, chewing tobacco, or electronic cigarettes. If you need help quitting, ask your health care provider.  It is important to eat a healthy diet and maintain a healthy weight. ? Be sure to include plenty of vegetables, fruits, low-fat dairy products, and lean protein. ? Avoid eating foods that are high in solid fats, added sugars, or salt (sodium).  Get regular exercise. This is one of the most important things that you can do for your health. ? Try to exercise for at least 150 minutes each week. The type of exercise that you do should increase your heart rate and make you sweat. This is known as moderate-intensity exercise. ? Try to do strengthening exercises at least twice each week. Do these in addition to the moderate-intensity exercise.  Know your numbers.Ask your health care provider to check your cholesterol and your blood glucose. Continue to have your blood tested as directed by your health care provider.  What should I know about cancer screening? There are several types of cancer. Take the following steps to reduce your risk and to catch any cancer development as early as possible. Breast Cancer  Practice breast self-awareness. ? This means understanding how your breasts normally appear and feel. ? It also means doing regular breast self-exams. Let your health care provider know about any changes, no matter how small.  If you are 40  or older, have a clinician do a breast exam (clinical breast exam or CBE) every year. Depending on your age, family history, and medical history, it may be recommended that you also have a yearly breast X-ray (mammogram).  If you  have a family history of breast cancer, talk with your health care provider about genetic screening.  If you are at high risk for breast cancer, talk with your health care provider about having an MRI and a mammogram every year.  Breast cancer (BRCA) gene test is recommended for women who have family members with BRCA-related cancers. Results of the assessment will determine the need for genetic counseling and BRCA1 and for BRCA2 testing. BRCA-related cancers include these types: ? Breast. This occurs in males or females. ? Ovarian. ? Tubal. This may also be called fallopian tube cancer. ? Cancer of the abdominal or pelvic lining (peritoneal cancer). ? Prostate. ? Pancreatic.  Cervical, Uterine, and Ovarian Cancer Your health care provider may recommend that you be screened regularly for cancer of the pelvic organs. These include your ovaries, uterus, and vagina. This screening involves a pelvic exam, which includes checking for microscopic changes to the surface of your cervix (Pap test).  For women ages 21-65, health care providers may recommend a pelvic exam and a Pap test every three years. For women ages 79-65, they may recommend the Pap test and pelvic exam, combined with testing for human papilloma virus (HPV), every five years. Some types of HPV increase your risk of cervical cancer. Testing for HPV may also be done on women of any age who have unclear Pap test results.  Other health care providers may not recommend any screening for nonpregnant women who are considered low risk for pelvic cancer and have no symptoms. Ask your health care provider if a screening pelvic exam is right for you.  If you have had past treatment for cervical cancer or a condition that could lead to cancer, you need Pap tests and screening for cancer for at least 20 years after your treatment. If Pap tests have been discontinued for you, your risk factors (such as having a new sexual partner) need to be  reassessed to determine if you should start having screenings again. Some women have medical problems that increase the chance of getting cervical cancer. In these cases, your health care provider may recommend that you have screening and Pap tests more often.  If you have a family history of uterine cancer or ovarian cancer, talk with your health care provider about genetic screening.  If you have vaginal bleeding after reaching menopause, tell your health care provider.  There are currently no reliable tests available to screen for ovarian cancer.  Lung Cancer Lung cancer screening is recommended for adults 69-62 years old who are at high risk for lung cancer because of a history of smoking. A yearly low-dose CT scan of the lungs is recommended if you:  Currently smoke.  Have a history of at least 30 pack-years of smoking and you currently smoke or have quit within the past 15 years. A pack-year is smoking an average of one pack of cigarettes per day for one year.  Yearly screening should:  Continue until it has been 15 years since you quit.  Stop if you develop a health problem that would prevent you from having lung cancer treatment.  Colorectal Cancer  This type of cancer can be detected and can often be prevented.  Routine colorectal cancer screening usually begins at  age 42 and continues through age 45.  If you have risk factors for colon cancer, your health care provider may recommend that you be screened at an earlier age.  If you have a family history of colorectal cancer, talk with your health care provider about genetic screening.  Your health care provider may also recommend using home test kits to check for hidden blood in your stool.  A small camera at the end of a tube can be used to examine your colon directly (sigmoidoscopy or colonoscopy). This is done to check for the earliest forms of colorectal cancer.  Direct examination of the colon should be repeated every  5-10 years until age 71. However, if early forms of precancerous polyps or small growths are found or if you have a family history or genetic risk for colorectal cancer, you may need to be screened more often.  Skin Cancer  Check your skin from head to toe regularly.  Monitor any moles. Be sure to tell your health care provider: ? About any new moles or changes in moles, especially if there is a change in a mole's shape or color. ? If you have a mole that is larger than the size of a pencil eraser.  If any of your family members has a history of skin cancer, especially at a young age, talk with your health care provider about genetic screening.  Always use sunscreen. Apply sunscreen liberally and repeatedly throughout the day.  Whenever you are outside, protect yourself by wearing long sleeves, pants, a wide-brimmed hat, and sunglasses.  What should I know about osteoporosis? Osteoporosis is a condition in which bone destruction happens more quickly than new bone creation. After menopause, you may be at an increased risk for osteoporosis. To help prevent osteoporosis or the bone fractures that can happen because of osteoporosis, the following is recommended:  If you are 46-71 years old, get at least 1,000 mg of calcium and at least 600 mg of vitamin D per day.  If you are older than age 55 but younger than age 65, get at least 1,200 mg of calcium and at least 600 mg of vitamin D per day.  If you are older than age 54, get at least 1,200 mg of calcium and at least 800 mg of vitamin D per day.  Smoking and excessive alcohol intake increase the risk of osteoporosis. Eat foods that are rich in calcium and vitamin D, and do weight-bearing exercises several times each week as directed by your health care provider. What should I know about how menopause affects my mental health? Depression may occur at any age, but it is more common as you become older. Common symptoms of depression  include:  Low or sad mood.  Changes in sleep patterns.  Changes in appetite or eating patterns.  Feeling an overall lack of motivation or enjoyment of activities that you previously enjoyed.  Frequent crying spells.  Talk with your health care provider if you think that you are experiencing depression. What should I know about immunizations? It is important that you get and maintain your immunizations. These include:  Tetanus, diphtheria, and pertussis (Tdap) booster vaccine.  Influenza every year before the flu season begins.  Pneumonia vaccine.  Shingles vaccine.  Your health care provider may also recommend other immunizations. This information is not intended to replace advice given to you by your health care provider. Make sure you discuss any questions you have with your health care provider. Document Released: 05/11/2005  Document Revised: 10/07/2015 Document Reviewed: 12/21/2014 Elsevier Interactive Patient Education  2018 [employer] Inc.  

## 2017-04-10 ENCOUNTER — Telehealth: Payer: Self-pay

## 2017-04-10 DIAGNOSIS — D649 Anemia, unspecified: Secondary | ICD-10-CM

## 2017-04-10 LAB — CBC WITH DIFFERENTIAL/PLATELET
BASOS: 1 %
Basophils Absolute: 0 10*3/uL (ref 0.0–0.2)
EOS (ABSOLUTE): 0.4 10*3/uL (ref 0.0–0.4)
EOS: 5 %
HEMATOCRIT: 34.2 % (ref 34.0–46.6)
HEMOGLOBIN: 10.9 g/dL — AB (ref 11.1–15.9)
Immature Grans (Abs): 0 10*3/uL (ref 0.0–0.1)
Immature Granulocytes: 0 %
LYMPHS ABS: 2.1 10*3/uL (ref 0.7–3.1)
Lymphs: 30 %
MCH: 33 pg (ref 26.6–33.0)
MCHC: 31.9 g/dL (ref 31.5–35.7)
MCV: 104 fL — AB (ref 79–97)
MONOCYTES: 9 %
Monocytes Absolute: 0.7 10*3/uL (ref 0.1–0.9)
Neutrophils Absolute: 3.8 10*3/uL (ref 1.4–7.0)
Neutrophils: 55 %
Platelets: 220 10*3/uL (ref 150–379)
RBC: 3.3 x10E6/uL — AB (ref 3.77–5.28)
RDW: 15.2 % (ref 12.3–15.4)
WBC: 7 10*3/uL (ref 3.4–10.8)

## 2017-04-10 LAB — COMPREHENSIVE METABOLIC PANEL
A/G RATIO: 1.3 (ref 1.2–2.2)
ALBUMIN: 3.8 g/dL (ref 3.5–4.8)
ALT: 20 IU/L (ref 0–32)
AST: 39 IU/L (ref 0–40)
Alkaline Phosphatase: 75 IU/L (ref 39–117)
BUN / CREAT RATIO: 8 — AB (ref 12–28)
BUN: 16 mg/dL (ref 8–27)
Bilirubin Total: 0.4 mg/dL (ref 0.0–1.2)
CO2: 24 mmol/L (ref 20–29)
CREATININE: 1.95 mg/dL — AB (ref 0.57–1.00)
Calcium: 9.4 mg/dL (ref 8.7–10.3)
Chloride: 100 mmol/L (ref 96–106)
GFR, EST AFRICAN AMERICAN: 29 mL/min/{1.73_m2} — AB (ref >59)
GFR, EST NON AFRICAN AMERICAN: 25 mL/min/{1.73_m2} — AB (ref >59)
GLOBULIN, TOTAL: 3 g/dL (ref 1.5–4.5)
Glucose: 101 mg/dL — ABNORMAL HIGH (ref 65–99)
POTASSIUM: 5.3 mmol/L — AB (ref 3.5–5.2)
SODIUM: 140 mmol/L (ref 134–144)
Total Protein: 6.8 g/dL (ref 6.0–8.5)

## 2017-04-10 LAB — HEMOGLOBIN A1C
Est. average glucose Bld gHb Est-mCnc: 137 mg/dL
Hgb A1c MFr Bld: 6.4 % — ABNORMAL HIGH (ref 4.8–5.6)

## 2017-04-10 LAB — TSH: TSH: 1.2 u[IU]/mL (ref 0.450–4.500)

## 2017-04-10 LAB — VITAMIN D 25 HYDROXY (VIT D DEFICIENCY, FRACTURES): Vit D, 25-Hydroxy: 71.6 ng/mL (ref 30.0–100.0)

## 2017-04-10 NOTE — Telephone Encounter (Signed)
-----   Message from Mar Daring, PA-C sent at 04/10/2017  1:07 PM EST ----- Microalbumin normal and stable. UA looks like there is a UTI. Please let me know if symptoms are not improving. HgB has dropped 2 points since 4 months ago. You are already being referred to GI which is what we would do to make sure you are not losing blood through your GI tract in some way. Also renal function is acutely elevated. Make sure to push fluids. Potassium is also elevated. Hold potassium supplement for now and will recheck both in 2 weeks. A1c is also elevated at 6.4, up from 6.1. Make sure to limit carbs and sugars in diet. Thyroid and Vit D normal.

## 2017-04-10 NOTE — Telephone Encounter (Signed)
No problem. Patient is aware she will receive a call with appt for GI.

## 2017-04-10 NOTE — Telephone Encounter (Signed)
Patient advised. She will call back in 2 weeks to request a lab slip to have labs rechecked.   Patient states she has not been scheduled with GI yet. I do not see a recent referral. Last referral was 11/20/16 for colonoscopy and diarrhea. Should there be a new referral ordered? Please advise.

## 2017-04-10 NOTE — Telephone Encounter (Signed)
I'm sorry that was an error. I had seen colon cancer screening pulled and had assumed, but I had pulled to note to myself she is due for cologuard testing in 2019. I will place new referral. I apologize for the confusion.

## 2017-04-11 ENCOUNTER — Telehealth: Payer: Self-pay

## 2017-04-11 DIAGNOSIS — E119 Type 2 diabetes mellitus without complications: Secondary | ICD-10-CM

## 2017-04-11 DIAGNOSIS — M7062 Trochanteric bursitis, left hip: Secondary | ICD-10-CM | POA: Diagnosis not present

## 2017-04-11 LAB — URINE CULTURE

## 2017-04-11 MED ORDER — CEFUROXIME AXETIL 250 MG PO TABS: 250.0000 mg | ORAL_TABLET | Freq: Two times a day (BID) | ORAL | 0 refills | Status: DC

## 2017-04-11 NOTE — Telephone Encounter (Signed)
-----   Message from Mar Daring, [place] sent at 04/11/2017 12:09 PM EST ----- Urine culture was positive but is insufficient coverage with Macrobid. Will change antibiotic to something that covers bacteria better. Stop Macrobid and switch to new antibiotic.

## 2017-04-11 NOTE — Telephone Encounter (Signed)
[email or web address]

## 2017-04-11 NOTE — Addendum Note (Signed)
Addended by: Mar Daring on: 04/11/2017 12:11 PM   Modules accepted: Orders

## 2017-04-12 MED ORDER — METFORMIN HCL 1000 MG PO TABS: 1000.0000 mg | ORAL_TABLET | Freq: Two times a day (BID) | ORAL | 2 refills | Status: DC

## 2017-04-12 NOTE — Telephone Encounter (Signed)
Patient reports FBS is running higher than when she was taking Metformin 2000 mg daily. Patient wants to know if she can increase her dose again to 2000 mg daily?

## 2017-04-12 NOTE — Telephone Encounter (Signed)
Patient advised as below. Patient verbalizes understanding and is in agreement with treatment plan.  

## 2017-04-12 NOTE — Telephone Encounter (Signed)
Yes that is ok

## 2017-04-18 DIAGNOSIS — E113393 Type 2 diabetes mellitus with moderate nonproliferative diabetic retinopathy without macular edema, bilateral: Secondary | ICD-10-CM | POA: Diagnosis not present

## 2017-04-18 DIAGNOSIS — Z1231 Encounter for screening mammogram for malignant neoplasm of breast: Secondary | ICD-10-CM | POA: Diagnosis not present

## 2017-04-18 LAB — HM MAMMOGRAPHY

## 2017-04-23 ENCOUNTER — Telehealth: Payer: Self-pay | Admitting: Physician Assistant

## 2017-04-23 ENCOUNTER — Ambulatory Visit (INDEPENDENT_AMBULATORY_CARE_PROVIDER_SITE_OTHER): Payer: PPO | Admitting: Gastroenterology

## 2017-04-23 ENCOUNTER — Encounter: Payer: Self-pay | Admitting: Gastroenterology

## 2017-04-23 ENCOUNTER — Other Ambulatory Visit: Payer: Self-pay

## 2017-04-23 VITALS — BP 141/85 | HR 74 | Temp 97.7°F | Ht 63.0 in | Wt 196.0 lb

## 2017-04-23 DIAGNOSIS — N183 Chronic kidney disease, stage 3 unspecified: Secondary | ICD-10-CM

## 2017-04-23 DIAGNOSIS — D519 Vitamin B12 deficiency anemia, unspecified: Secondary | ICD-10-CM

## 2017-04-23 DIAGNOSIS — D518 Other vitamin B12 deficiency anemias: Secondary | ICD-10-CM

## 2017-04-23 NOTE — Progress Notes (Signed)
Donna Darby, MD [address]  [place]  [place], [place] [address]  Main: [phone]  Fax: [phone]    Gastroenterology Consultation  Referring Provider:     Florian Buff* Primary Care Physician:  Mar Daring, PA-C Primary Gastroenterologist:  Dr. Cephas Malone Reason for Consultation:     Macrocytic anemia        HPI:   Donna Malone is a 72 y.o. female referred by Dr. Marlyn Corporal, Clearnce Sorrel, PA-C  for consultation & management of macrocytic anemia, B12 deficiency. Patient is found to have B12 deficiency anemia 10/2016. She reports having chronic fatigue. Her B12 levels were 179 in 10/2016, a month later to 21. She started taking oral B12 1000 MCG daily. She reports that her energy levels have slightly improved but continues to feel tired. However, her hemoglobin continues to down trend, most recently 10.9, MCV 104. She denies abdominal pain pain, diarrhea, melena, hematochezia. She is trying to lose weight as her A1c levels are within prediabetes range. Unknown folate and ferritin levels. She reports having had an upper endoscopy a few years ago. She was seeing Dr. Vira Agar in 2015 for GERD. She denies having had a colonoscopy and has been undergoing fecal occult blood testing every 3 years. She said she is due for a colo-guard in 10/2017. Of note, she is found to have elevated sedimentation rate 75 in 10/2015.  NSAIDs: None  Antiplts/Anticoagulants/Anti thrombotics: Aspirin 81  GI Procedures: EGD few years ago  Past Medical History:  Diagnosis Date  . Asthma   . Bone spur    heel  . Depression   . Gout   . Hypertension     Past Surgical History:  Procedure Laterality Date  . ABDOMINAL HYSTERECTOMY  04/1970   partial  . CHOLECYSTECTOMY  late 1990's    Prior to Admission medications   Medication Sig Start Date End Date Taking? Authorizing Provider  allopurinol (ZYLOPRIM) 100 MG tablet Take 1 tablet (100 mg total) by mouth daily.  07/09/16   Mar Daring, PA-C  aspirin 81 MG tablet Take 81 mg by mouth daily.  06/21/10   [provider]  atorvastatin (LIPITOR) 40 MG tablet Take 1 tablet by mouth  daily 03/19/17   Mar Daring, PA-C  B Complex Vitamins (GNP VITAMIN B COMPLEX PO) Take by mouth daily.     [provider]  cefUROXime (CEFTIN) 250 MG tablet Take 1 tablet (250 mg total) by mouth 2 (two) times daily with a meal. 04/11/17   Burnette, Clearnce Sorrel, PA-C  colchicine 0.6 MG tablet TAKE 1 TABLET BY MOUTH TWICE A DAY Patient taking differently: TAKE 1 TABLET BY MOUTH TWICE A DAY as needed 11/23/16   Mar Daring, PA-C  escitalopram (LEXAPRO) 20 MG tablet Take 1 tablet (20 mg total) by mouth daily. 04/09/17   Mar Daring, PA-C  furosemide (LASIX) 40 MG tablet Take 1 tablet (40 mg total) by mouth daily. 07/12/16   Mar Daring, PA-C  glipiZIDE (GLUCOTROL XL) 5 MG 24 hr tablet Take 1 tablet (5 mg total) by mouth daily with breakfast. 04/09/17   Mar Daring, PA-C  levothyroxine (SYNTHROID, LEVOTHROID) 75 MCG tablet Take 1 tablet by mouth every day 02/25/17   Mar Daring, PA-C  lisinopril (PRINIVIL,ZESTRIL) 5 MG tablet Take 1 tablet by mouth every other day Patient taking differently: 5 mg. Take 1 tablet by mouth every other day 07/11/16   Mar Daring,  PA-C  metFORMIN (GLUCOPHAGE) 1000 MG tablet Take 1 tablet (1,000 mg total) by mouth 2 (two) times daily with a meal. 04/12/17   Burnette, Clearnce Sorrel, PA-C  nitrofurantoin, macrocrystal-monohydrate, (MACROBID) 100 MG capsule Take 1 capsule (100 mg total) by mouth 2 (two) times daily. 04/09/17   Mar Daring, PA-C  ONE TOUCH ULTRA TEST test strip TEST BLOOD SUGAR ONCE DAILY 01/28/17   Mar Daring, PA-C  potassium chloride (MICRO-K) 10 MEQ CR capsule Take 1 capsule by mouth  daily 07/09/16   Mar Daring, PA-C  ranitidine (ZANTAC) 150 MG tablet Take 1 tablet (150 mg total) by mouth 2 (two)  times daily. 07/12/16   Mar Daring, PA-C  traZODone (DESYREL) 100 MG tablet Take 1 tablet by mouth at  bedtime 04/09/17   Mar Daring, PA-C  Vitamin D, Ergocalciferol, (DRISDOL) 50000 units CAPS capsule Take 1 capsule (50,000 Units total) by mouth once a week. 03/13/17   Virginia Crews, MD    Family History  Problem Relation Age of Onset  . CAD Mother   . Heart attack Mother   . Heart disease Father   . Diabetes Cousin      Social History   Tobacco Use  . Smoking status: Never Smoker  . Smokeless tobacco: Never Used  Substance Use Topics  . Alcohol use: No  . Drug use: No    Allergies as of 04/23/2017 - Review Complete 04/23/2017  Allergen Reaction Noted  . Sulfa antibiotics  09/21/2014    Review of Systems:    All systems reviewed and negative except where noted in HPI.   Physical Exam:  BP (!) 141/85   Pulse 74   Temp 97.7 F (36.5 C) (Oral)   Ht 5\' 3"  (1.6 m)   Wt 196 lb (88.9 kg)   BMI 34.72 kg/m  No LMP recorded. Patient is postmenopausal.  General:   Alert,  Well-developed, well-nourished, pleasant and cooperative in NAD Head:  Normocephalic and atraumatic. Eyes:  Sclera clear, no icterus.   Conjunctiva pink. Ears:  Normal auditory acuity. Nose:  No deformity, discharge, or lesions. Mouth:  No deformity or lesions,oropharynx pink & moist. Neck:  Supple; no masses or thyromegaly. Lungs:  Respirations even and unlabored.  Clear throughout to auscultation.   No wheezes, crackles, or rhonchi. No acute distress. Heart:  Regular rate and rhythm; no murmurs, clicks, rubs, or gallops. Abdomen:  Normal bowel sounds. Soft, non-tender and non-distended without masses, hepatosplenomegaly or hernias noted.  No guarding or rebound tenderness.   Rectal: Not performed Msk:  Symmetrical without gross deformities. Good, equal movement & strength bilaterally. Pulses:  Normal pulses noted. Extremities:  No clubbing or edema.  No cyanosis. Neurologic:   Alert and oriented x3;  grossly normal neurologically. Skin:  Intact without significant lesions or rashes. No jaundice. Lymph Nodes:  No significant cervical adenopathy. Psych:  Alert and cooperative. Normal mood and affect.  Imaging Studies: No abdominal imaging  Assessment and Plan:   [name] is a 72 y.o. White female with B12 deficiency anemia, referred for further evaluation.  - Recheck B12 levels, - Check intrinsic factor antibodies, parietal cell antibodies - Check ferritin, iron, TIBC and folate levels - Recheck CBC, CMP - Repeat EGD with biopsies based on the above results   Follow up in 4 weeks   Donna Darby, MD

## 2017-04-23 NOTE — Telephone Encounter (Signed)
Lab ordered. sd

## 2017-04-23 NOTE — Telephone Encounter (Signed)
Patient called stating she is due for some lab work.  If you will get the order ready, she will be by to get them. Call when ready.

## 2017-04-24 ENCOUNTER — Telehealth: Payer: Self-pay

## 2017-04-24 LAB — COMPREHENSIVE METABOLIC PANEL
ALBUMIN: 3.7 g/dL (ref 3.5–4.8)
ALK PHOS: 71 IU/L (ref 39–117)
ALT: 26 IU/L (ref 0–32)
AST: 43 IU/L — ABNORMAL HIGH (ref 0–40)
Albumin/Globulin Ratio: 1.4 (ref 1.2–2.2)
BUN/Creatinine Ratio: 12 (ref 12–28)
BUN: 20 mg/dL (ref 8–27)
Bilirubin Total: 0.3 mg/dL (ref 0.0–1.2)
CHLORIDE: 104 mmol/L (ref 96–106)
CO2: 20 mmol/L (ref 20–29)
CREATININE: 1.69 mg/dL — AB (ref 0.57–1.00)
Calcium: 9.2 mg/dL (ref 8.7–10.3)
GFR calc non Af Amer: 29 mL/min/{1.73_m2} — ABNORMAL LOW (ref >59)
GFR, EST AFRICAN AMERICAN: 34 mL/min/{1.73_m2} — AB (ref >59)
GLUCOSE: 150 mg/dL — AB (ref 65–99)
Globulin, Total: 2.6 g/dL (ref 1.5–4.5)
Potassium: 4.6 mmol/L (ref 3.5–5.2)
Sodium: 142 mmol/L (ref 134–144)
TOTAL PROTEIN: 6.3 g/dL (ref 6.0–8.5)

## 2017-04-24 LAB — CBC WITH DIFFERENTIAL/PLATELET
BASOS ABS: 0 10*3/uL (ref 0.0–0.2)
Basos: 0 %
EOS (ABSOLUTE): 0.2 10*3/uL (ref 0.0–0.4)
EOS: 2 %
HEMATOCRIT: 32.8 % — AB (ref 34.0–46.6)
HEMOGLOBIN: 11.1 g/dL (ref 11.1–15.9)
IMMATURE GRANS (ABS): 0 10*3/uL (ref 0.0–0.1)
Immature Granulocytes: 0 %
LYMPHS: 26 %
Lymphocytes Absolute: 2.7 10*3/uL (ref 0.7–3.1)
MCH: 32.8 pg (ref 26.6–33.0)
MCHC: 33.8 g/dL (ref 31.5–35.7)
MCV: 97 fL (ref 79–97)
MONOCYTES: 9 %
Monocytes Absolute: 0.9 10*3/uL (ref 0.1–0.9)
Neutrophils Absolute: 6.5 10*3/uL (ref 1.4–7.0)
Neutrophils: 63 %
Platelets: 254 10*3/uL (ref 150–379)
RBC: 3.38 x10E6/uL — AB (ref 3.77–5.28)
RDW: 15.1 % (ref 12.3–15.4)
WBC: 10.3 10*3/uL (ref 3.4–10.8)

## 2017-04-24 LAB — INTRINSIC FACTOR ANTIBODIES: INTRINSIC FACTOR ABS, SERUM: 0.9 [AU]/ml (ref 0.0–1.1)

## 2017-04-24 LAB — IRON AND TIBC
IRON: 56 ug/dL (ref 27–139)
Iron Saturation: 15 % (ref 15–55)
TIBC: 362 ug/dL (ref 250–450)
UIBC: 306 ug/dL (ref 118–369)

## 2017-04-24 LAB — ANTI-PARIETAL ANTIBODY: PARIETAL CELL AB: 1.8 U (ref 0.0–20.0)

## 2017-04-24 LAB — FOLATE: Folate: >20 ng/mL (ref >3.0)

## 2017-04-24 LAB — VITAMIN B12: Vitamin B-12: 246 pg/mL (ref 232–1245)

## 2017-04-24 LAB — FERRITIN: Ferritin: 19 ng/mL (ref 15–150)

## 2017-04-24 NOTE — Telephone Encounter (Signed)
Patient advised.

## 2017-04-24 NOTE — Telephone Encounter (Signed)
LMTCB

## 2017-04-24 NOTE — Telephone Encounter (Signed)
-----   Message from Mar Daring, [place] sent at 04/24/2017 10:20 AM EST ----- Renal function is improving from previous. Still slightly increased over previous baseline but much better than had been 2 weeks ago. Also blood count has improved as well.

## 2017-05-01 ENCOUNTER — Telehealth: Payer: Self-pay

## 2017-05-01 NOTE — Telephone Encounter (Signed)
Patient advised that her mammogram came back normal. Per patient she already has the appointment scheduled for next year screening.  Thanks,  -Nehemias Sauceda

## 2017-05-20 ENCOUNTER — Ambulatory Visit: Payer: PPO | Admitting: Gastroenterology

## 2017-05-20 ENCOUNTER — Encounter: Payer: Self-pay | Admitting: Gastroenterology

## 2017-05-20 VITALS — BP 124/75 | HR 87 | Temp 97.6°F | Ht 63.0 in | Wt 190.0 lb

## 2017-05-20 DIAGNOSIS — D518 Other vitamin B12 deficiency anemias: Secondary | ICD-10-CM | POA: Diagnosis not present

## 2017-05-20 NOTE — Progress Notes (Signed)
[name], MD [address]  [place]  [place], [place] [address]  Main: [phone]  Fax: [phone]    Gastroenterology Consultation  Referring Provider:     Florian Buff* Primary Care Physician:  Mar Daring, PA-C Primary Gastroenterologist:  Dr. [name] Reason for Consultation:   B12 deficiency anemia        HPI:   Donna Malone is a 72 y.o. female referred by Dr. Marlyn Corporal, Clearnce Sorrel, PA-C  for consultation & management of macrocytic anemia, B12 deficiency. Patient is found to have B12 deficiency anemia 10/2016. She reports having chronic fatigue. Her B12 levels were 179 in 10/2016, a month later to 57. She started taking oral B12 1000 MCG daily. She reports that her energy levels have slightly improved but continues to feel tired. However, her hemoglobin continues to down trend, most recently 10.9, MCV 104. She denies abdominal pain pain, diarrhea, melena, hematochezia. She is trying to lose weight as her A1c levels are within prediabetes range. Unknown folate and ferritin levels. She reports having had an upper endoscopy a few years ago. She was seeing Dr. Vira Agar in 2015 for GERD. She denies having had a colonoscopy and has been undergoing fecal occult blood testing every 3 years. She said she is due for a colo-guard in 10/2017. Of note, she is found to have elevated sedimentation rate 75 in 10/2015.  Follow-up visit 05/20/2017 Patient had workup for B12 deficiency was negative for pernicious anemia. Her hemoglobin is improving on oral B12 replacement 1000 MCG daily. Her ferritin is on lower side of the normal range. She continues to feel fatigued.  NSAIDs: None  Antiplts/Anticoagulants/Anti thrombotics: Aspirin 81  GI Procedures: EGD few years ago  Past Medical History:  Diagnosis Date  . Asthma   . Bone spur    heel  . Depression   . Gout   . Hypertension     Past Surgical History:  Procedure Laterality Date  . ABDOMINAL  HYSTERECTOMY  04/1970   partial  . CHOLECYSTECTOMY  late 1990's    Prior to Admission medications   Medication Sig Start Date End Date Taking? Authorizing Provider  allopurinol (ZYLOPRIM) 100 MG tablet Take 1 tablet (100 mg total) by mouth daily. 07/09/16   Mar Daring, PA-C  aspirin 81 MG tablet Take 81 mg by mouth daily.  06/21/10   [provider]  atorvastatin (LIPITOR) 40 MG tablet Take 1 tablet by mouth  daily 03/19/17   Mar Daring, PA-C  B Complex Vitamins (GNP VITAMIN B COMPLEX PO) Take by mouth daily.     [provider]  cefUROXime (CEFTIN) 250 MG tablet Take 1 tablet (250 mg total) by mouth 2 (two) times daily with a meal. 04/11/17   Burnette, Clearnce Sorrel, PA-C  colchicine 0.6 MG tablet TAKE 1 TABLET BY MOUTH TWICE A DAY Patient taking differently: TAKE 1 TABLET BY MOUTH TWICE A DAY as needed 11/23/16   Mar Daring, PA-C  escitalopram (LEXAPRO) 20 MG tablet Take 1 tablet (20 mg total) by mouth daily. 04/09/17   Mar Daring, PA-C  furosemide (LASIX) 40 MG tablet Take 1 tablet (40 mg total) by mouth daily. 07/12/16   Mar Daring, PA-C  glipiZIDE (GLUCOTROL XL) 5 MG 24 hr tablet Take 1 tablet (5 mg total) by mouth daily with breakfast. 04/09/17   Mar Daring, PA-C  levothyroxine (SYNTHROID, LEVOTHROID) 75 MCG tablet Take 1 tablet by mouth every day  02/25/17   Mar Daring, PA-C  lisinopril (PRINIVIL,ZESTRIL) 5 MG tablet Take 1 tablet by mouth every other day Patient taking differently: 5 mg. Take 1 tablet by mouth every other day 07/11/16   Mar Daring, PA-C  metFORMIN (GLUCOPHAGE) 1000 MG tablet Take 1 tablet (1,000 mg total) by mouth 2 (two) times daily with a meal. 04/12/17   Burnette, Clearnce Sorrel, PA-C  nitrofurantoin, macrocrystal-monohydrate, (MACROBID) 100 MG capsule Take 1 capsule (100 mg total) by mouth 2 (two) times daily. 04/09/17   Mar Daring, PA-C  ONE TOUCH ULTRA TEST test strip TEST BLOOD  SUGAR ONCE DAILY 01/28/17   Mar Daring, PA-C  potassium chloride (MICRO-K) 10 MEQ CR capsule Take 1 capsule by mouth  daily 07/09/16   Mar Daring, PA-C  ranitidine (ZANTAC) 150 MG tablet Take 1 tablet (150 mg total) by mouth 2 (two) times daily. 07/12/16   Mar Daring, PA-C  traZODone (DESYREL) 100 MG tablet Take 1 tablet by mouth at  bedtime 04/09/17   Mar Daring, PA-C  Vitamin D, Ergocalciferol, (DRISDOL) 50000 units CAPS capsule Take 1 capsule (50,000 Units total) by mouth once a week. 03/13/17   Virginia Crews, MD    Family History  Problem Relation Age of Onset  . CAD Mother   . Heart attack Mother   . Heart disease Father   . Diabetes Cousin      Social History   Tobacco Use  . Smoking status: Never Smoker  . Smokeless tobacco: Never Used  Substance Use Topics  . Alcohol use: No  . Drug use: No    Allergies as of 05/20/2017 - Review Complete 05/20/2017  Allergen Reaction Noted  . Sulfa antibiotics  09/21/2014    Review of Systems:    All systems reviewed and negative except where noted in HPI.   Physical Exam:  BP 124/75   Pulse 87   Temp 97.6 F (36.4 C) (Oral)   Ht 5\' 3"  (1.6 m)   Wt 190 lb (86.2 kg)   BMI 33.66 kg/m  No LMP recorded. Patient is postmenopausal.  General:   Alert,  Well-developed, well-nourished, pleasant and cooperative in NAD Head:  Normocephalic and atraumatic. Eyes:  Sclera clear, no icterus.   Conjunctiva pink. Ears:  Normal auditory acuity. Nose:  No deformity, discharge, or lesions. Mouth:  No deformity or lesions,oropharynx pink & moist. Neck:  Supple; no masses or thyromegaly. Lungs:  Respirations even and unlabored.  Clear throughout to auscultation.   No wheezes, crackles, or rhonchi. No acute distress. Heart:  Regular rate and rhythm; no murmurs, clicks, rubs, or gallops. Abdomen:  Normal bowel sounds. Soft, non-tender and non-distended without masses, hepatosplenomegaly or hernias noted.   No guarding or rebound tenderness.   Rectal: Not performed Msk:  Symmetrical without gross deformities. Good, equal movement & strength bilaterally. Pulses:  Normal pulses noted. Extremities:  No clubbing or edema.  No cyanosis. Neurologic:  Alert and oriented x3;  grossly normal neurologically. Skin:  Intact without significant lesions or rashes. No jaundice. Lymph Nodes:  No significant cervical adenopathy. Psych:  Alert and cooperative. Normal mood and affect.  Imaging Studies: No abdominal imaging  Assessment and Plan:   Donna Malone is a 72 y.o. White female with B12 deficiency anemia here for follow-up. She does not have pernicious anemia. Her anti-intrinsic factor antibody and antiparietal cell antibodies are negative. Her hemoglobin is responding to oral B12 replacement. Do not recommend EGD at this  time.  - Continue oral B12 supplements - recheck B12 levels in 3 months and adjust the dose accordingly - Recommend to start oral iron 325 mg daily, samples given  Follow up as needed  [name], MD

## 2017-05-27 ENCOUNTER — Telehealth: Payer: Self-pay | Admitting: Physician Assistant

## 2017-05-27 DIAGNOSIS — E038 Other specified hypothyroidism: Secondary | ICD-10-CM

## 2017-05-27 MED ORDER — LEVOTHYROXINE SODIUM 75 MCG PO TABS: 75.0000 ug | ORAL_TABLET | Freq: Every day | ORAL | 1 refills | Status: DC

## 2017-05-27 NOTE — Telephone Encounter (Signed)
Patient called requesting refill on her levothyroxine (SYNTHROID, LEVOTHROID) 75 MCG tablet    [facility]   CB# [phone]

## 2017-05-27 NOTE — Telephone Encounter (Signed)
refilled 

## 2017-06-11 DIAGNOSIS — M7062 Trochanteric bursitis, left hip: Secondary | ICD-10-CM | POA: Diagnosis not present

## 2017-06-23 ENCOUNTER — Other Ambulatory Visit: Payer: Self-pay | Admitting: Physician Assistant

## 2017-06-23 DIAGNOSIS — K219 Gastro-esophageal reflux disease without esophagitis: Secondary | ICD-10-CM

## 2017-06-23 DIAGNOSIS — R609 Edema, unspecified: Secondary | ICD-10-CM

## 2017-08-15 ENCOUNTER — Other Ambulatory Visit: Payer: Self-pay | Admitting: Physician Assistant

## 2017-08-15 DIAGNOSIS — K219 Gastro-esophageal reflux disease without esophagitis: Secondary | ICD-10-CM

## 2017-08-15 MED ORDER — RANITIDINE HCL 150 MG PO TABS: 150.0000 mg | ORAL_TABLET | Freq: Two times a day (BID) | ORAL | 3 refills | Status: DC

## 2017-08-15 NOTE — Telephone Encounter (Signed)
Pt needs a refill on ranitidine 150mg  twice daily  She uses [facility]

## 2017-09-05 ENCOUNTER — Other Ambulatory Visit: Payer: Self-pay | Admitting: Physician Assistant

## 2017-09-05 DIAGNOSIS — R609 Edema, unspecified: Secondary | ICD-10-CM

## 2017-09-14 ENCOUNTER — Other Ambulatory Visit: Payer: Self-pay | Admitting: Physician Assistant

## 2017-09-14 DIAGNOSIS — M10471 Other secondary gout, right ankle and foot: Secondary | ICD-10-CM

## 2017-10-04 ENCOUNTER — Other Ambulatory Visit: Payer: Self-pay | Admitting: Physician Assistant

## 2017-10-04 DIAGNOSIS — I10 Essential (primary) hypertension: Secondary | ICD-10-CM

## 2017-10-18 ENCOUNTER — Telehealth: Payer: Self-pay | Admitting: Physician Assistant

## 2017-10-18 DIAGNOSIS — E119 Type 2 diabetes mellitus without complications: Secondary | ICD-10-CM

## 2017-10-18 MED ORDER — METFORMIN HCL 1000 MG PO TABS: 1000.0000 mg | ORAL_TABLET | Freq: Two times a day (BID) | ORAL | 2 refills | Status: DC

## 2017-10-18 NOTE — Telephone Encounter (Signed)
Attempted to contact patient. Line was busy.

## 2017-10-18 NOTE — Telephone Encounter (Signed)
Pt stated that she has noticed she her metFORMIN (GLUCOPHAGE) 1000 MG tablet was changed to 1 tablet once day her sugar has been higher than it was when she was taking 1 tablet twice a day. Pt asked if she should go back to taking it twice a day and if so she would like an Rx sent to [facility]. Please advise. Thanks TNP

## 2017-10-18 NOTE — Telephone Encounter (Signed)
Please Review

## 2017-10-18 NOTE — Telephone Encounter (Signed)
Yes she can I will send in

## 2017-10-21 NOTE — Telephone Encounter (Signed)
Patient advised as directed below.  Thanks,  -Joseline 

## 2017-11-19 ENCOUNTER — Other Ambulatory Visit: Payer: Self-pay | Admitting: Family Medicine

## 2017-11-19 DIAGNOSIS — E559 Vitamin D deficiency, unspecified: Secondary | ICD-10-CM

## 2017-11-19 NOTE — Telephone Encounter (Signed)
Will forward to PCP 

## 2017-11-24 ENCOUNTER — Other Ambulatory Visit: Payer: Self-pay | Admitting: Physician Assistant

## 2017-11-24 DIAGNOSIS — E038 Other specified hypothyroidism: Secondary | ICD-10-CM

## 2018-01-22 ENCOUNTER — Telehealth: Payer: Self-pay | Admitting: Physician Assistant

## 2018-01-22 NOTE — Telephone Encounter (Signed)
Pt needing an order to get her A1C checked on Friday when her husband is coming in.  Please advise.  Thanks, [employer]

## 2018-01-22 NOTE — Telephone Encounter (Signed)
She will need a follow up visit with Tawanna Sat for labwork orders, she hasn't been seen by her in > 6 months which was her typical follow up.

## 2018-01-23 NOTE — Telephone Encounter (Signed)
Patient scheduled.

## 2018-02-05 NOTE — Progress Notes (Signed)
Patient: Donna Malone Female    DOB: Jul 10, 1945   72 y.o.   MRN: [id number] Visit Date: 02/07/2018  Today's Provider: Mar Daring, PA-C   Chief Complaint  Patient presents with  . Follow-up    DM   Subjective:    HPI  Diabetes Mellitus Type II, Follow-up:   Lab Results  Component Value Date   HGBA1C 6.4 (H) 04/09/2017   HGBA1C 6.1 11/20/2016   HGBA1C 6.4 05/23/2016    Last seen for diabetes 11 months ago.  Management since then includes none. She reports excellent compliance with treatment. She is not having side effects. Current symptoms include hyperglycemia, hypoglycemia  and polydipsia and have been unchanged. Home blood sugar records: reports that this morning it was 188.  Episodes of hypoglycemia? yes - 68 reports this is when she feels shaky and gets the heart palpitations and she feels weak.   Current Insulin Regimen: none Most Recent Eye Exam: Scheduled for this month the 21. Weight trend: stable Current diet: well balanced Current exercise: no regular exercise  Pertinent Labs:    Component Value Date/Time   CHOL 149 11/20/2016 0913   TRIG 232 (H) 11/20/2016 0913   HDL 41 11/20/2016 0913   LDLCALC 62 11/20/2016 0913   CREATININE 1.69 (H) 04/23/2017 1644    Wt Readings from Last 3 Encounters:  02/07/18 201 lb 9.6 oz (91.4 kg)  05/20/17 190 lb (86.2 kg)  04/23/17 196 lb (88.9 kg)    ------------------------------------------------------------------------  Hypothyroid, follow-up:  TSH  Date Value Ref Range Status  04/09/2017 1.200 0.450 - 4.500 uIU/mL Final  11/20/2016 2.620 0.450 - 4.500 uIU/mL Final  11/10/2015 1.140 0.450 - 4.500 uIU/mL Final   Wt Readings from Last 3 Encounters:  02/07/18 201 lb 9.6 oz (91.4 kg)  05/20/17 190 lb (86.2 kg)  04/23/17 196 lb (88.9 kg)    She was last seen for hypothyroid 11 months ago.  Management since that visit includes none. She reports excellent compliance with treatment. She  is not having side effects.  She is not exercising. She is experiencing change in energy level ,Palpitations sometimes and hot. She denies diarrhea, nervousness and weight changes Weight trend: stable  ------------------------------------------------------------------------ Patient reports that she has been in a lot of stress lately. Reports that she fell yesterday while walking to the mailbox on her driveway. Is concrete and she scrape her right arm. She reports is sore to touch over the lateral forearm. Was very swollen yesterday but today only minimally. Able to move elbow without issue.     Allergies  Allergen Reactions  . Sulfa Antibiotics      Current Outpatient Medications:  .  allopurinol (ZYLOPRIM) 100 MG tablet, TAKE 1 TABLET (100 MG TOTAL) BY MOUTH DAILY., Disp: 90 tablet, Rfl: 1 .  aspirin 81 MG tablet, Take 81 mg by mouth daily. , Disp: , Rfl:  .  atorvastatin (LIPITOR) 40 MG tablet, Take 1 tablet by mouth  daily, Disp: 90 tablet, Rfl: 3 .  B Complex Vitamins (GNP VITAMIN B COMPLEX PO), Take by mouth daily. , Disp: , Rfl:  .  cefUROXime (CEFTIN) 250 MG tablet, Take 1 tablet (250 mg total) by mouth 2 (two) times daily with a meal., Disp: 10 tablet, Rfl: 0 .  colchicine 0.6 MG tablet, TAKE 1 TABLET BY MOUTH TWICE A DAY (Patient taking differently: TAKE 1 TABLET BY MOUTH TWICE A DAY as needed), Disp: 60 tablet, Rfl: 5 .  escitalopram (  LEXAPRO) 20 MG tablet, Take 1 tablet (20 mg total) by mouth daily., Disp: 90 tablet, Rfl: 3 .  furosemide (LASIX) 40 MG tablet, TAKE 1 TABLET (40 MG TOTAL) BY MOUTH DAILY., Disp: 90 tablet, Rfl: 1 .  glipiZIDE (GLUCOTROL XL) 5 MG 24 hr tablet, Take 1 tablet (5 mg total) by mouth daily with breakfast., Disp: 90 tablet, Rfl: 3 .  glucose blood test strip, OneTouch Ultra Blue Test Strip, Disp: , Rfl:  .  levothyroxine (SYNTHROID, LEVOTHROID) 75 MCG tablet, TAKE 1 TABLET BY MOUTH EVERY DAY, Disp: 90 tablet, Rfl: 1 .  meloxicam (MOBIC) 15 MG tablet,  TAKE 1 TABLET BY MOUTH EVERY DAY WITH MEALS, Disp: , Rfl: 1 .  metFORMIN (GLUCOPHAGE) 1000 MG tablet, Take 1 tablet (1,000 mg total) by mouth 2 (two) times daily with a meal., Disp: 180 tablet, Rfl: 2 .  ONE TOUCH ULTRA TEST test strip, TEST BLOOD SUGAR ONCE DAILY, Disp: 100 each, Rfl: 4 .  potassium chloride (MICRO-K) 10 MEQ CR capsule, TAKE 1 CAPSULE BY MOUTH DAILY, Disp: 90 capsule, Rfl: 3 .  ranitidine (ZANTAC) 150 MG tablet, Take 1 tablet (150 mg total) by mouth 2 (two) times daily., Disp: 180 tablet, Rfl: 3 .  traMADol (ULTRAM) 50 MG tablet, TAKE ONE TABLET EVERY SIX HOURS AS NEEDED FOR PAIN, Disp: , Rfl: 0 .  traZODone (DESYREL) 100 MG tablet, Take 1 tablet by mouth at  bedtime, Disp: 90 tablet, Rfl: 3 .  Vitamin D, Ergocalciferol, (DRISDOL) 50000 units CAPS capsule, TAKE ONE CAPSULE BY MOUTH ONE TIME PER WEEK, Disp: 12 capsule, Rfl: 1  Review of Systems  Constitutional: Positive for fatigue.  HENT: Negative.   Respiratory: Negative.   Cardiovascular: Negative.   Gastrointestinal: Negative.   Musculoskeletal: Positive for arthralgias and joint swelling.  Neurological: Negative.   Hematological: Negative.   Psychiatric/Behavioral: Positive for dysphoric mood (husband was in hospital for 13 days).    Social History   Tobacco Use  . Smoking status: Never Smoker  . Smokeless tobacco: Never Used  Substance Use Topics  . Alcohol use: No   Objective:   BP 125/82 (BP Location: Left Arm, Patient Position: Sitting, Cuff Size: Normal)   Pulse 74   Temp 97.9 F (36.6 C) (Oral)   Resp 16   Wt 201 lb 9.6 oz (91.4 kg)   SpO2 96%   BMI 35.71 kg/m  Vitals:   02/07/18 0859  BP: 125/82  Pulse: 74  Resp: 16  Temp: 97.9 F (36.6 C)  TempSrc: Oral  SpO2: 96%  Weight: 201 lb 9.6 oz (91.4 kg)     Physical Exam  Constitutional: She appears well-developed and well-nourished. No distress.  Neck: Normal range of motion. Neck supple.  Cardiovascular: Normal rate, regular rhythm and  normal heart sounds. Exam reveals no gallop and no friction rub.  No murmur heard. Pulmonary/Chest: Effort normal and breath sounds normal. No respiratory distress. She has no wheezes. She has no rales.  Musculoskeletal:       Right elbow: She exhibits swelling. She exhibits normal range of motion and no deformity. Tenderness found. Radial head tenderness noted.  Skin: She is not diaphoretic.  Vitals reviewed.       Assessment & Plan:     1. Type 2 diabetes mellitus without complication, without long-term current use of insulin (HCC) Stable. Continue Glipizide 5mg  XR and metformin 1000mg  BID. Will check labs as below and f/u pending results. - Comprehensive metabolic panel - Hemoglobin A1c - CBC  w/Diff/Platelet  2. Heart & renal disease, hypertensive, with heart failure (HCC) Previously stable. Was on lisinopril 5mg  every other day. BP has been running less than 100/60s at home and patient having fatigue. Will stop lisinopril for now. Continue to monitor BP.  - Comprehensive metabolic panel - Hemoglobin A1c - Lipid panel - CBC w/Diff/Platelet - Magnesium  3. Acquired hypothyroidism Stable on levothyroxine 41mcg. Will check labs as below and f/u pending results. - TSH  4. Chronic kidney disease, stage III (moderate) (HCC) Stable. See above plan for #2.  - Comprehensive metabolic panel - Magnesium  5. Hypercholesteremia Stable on atorvastatin 40mg . Will check labs as below and f/u pending results. - Comprehensive metabolic panel - Lipid panel  6. Vitamin D deficiency, unspecified H/O this. Currently on Vit D 50,000 IU daily. Will check labs as below and f/u pending results. - CBC w/Diff/Platelet - Vitamin D (25 hydroxy)  7. Fall, initial encounter [place] while walking to [employer] yesterday and now with point tenderness over the radial head of the right elbow. I will get imaging as below and f/u pending results.  - DG Elbow Complete Right; Future  8. Osteopenia of multiple  sites Bone density due in 05/2018. Ordered as below.  - DG Bone Density; Future  9. Colon cancer screening Time for repeat cologuard testing. Ordered as below.  - Cologuard  10. Breast cancer screening Mammogram due in January 2020. Advised to call [facility] to schedule.   11. Need for influenza vaccination Flu vaccine given today without complication. Patient sat upright for 15 minutes to check for adverse reaction before being released. - Flu vaccine HIGH DOSE PF       Mar Daring, PA-C  [facility]

## 2018-02-06 ENCOUNTER — Other Ambulatory Visit: Payer: Self-pay | Admitting: Physician Assistant

## 2018-02-06 DIAGNOSIS — E119 Type 2 diabetes mellitus without complications: Secondary | ICD-10-CM

## 2018-02-07 ENCOUNTER — Encounter: Payer: Self-pay | Admitting: Physician Assistant

## 2018-02-07 ENCOUNTER — Ambulatory Visit (INDEPENDENT_AMBULATORY_CARE_PROVIDER_SITE_OTHER): Payer: PPO | Admitting: Physician Assistant

## 2018-02-07 ENCOUNTER — Ambulatory Visit
Admission: RE | Admit: 2018-02-07 | Discharge: 2018-02-07 | Disposition: A | Discharge status: Home / Self Care | Payer: PPO | Source: Ambulatory Visit | Attending: Physician Assistant | Admitting: Physician Assistant

## 2018-02-07 ENCOUNTER — Telehealth: Payer: Self-pay

## 2018-02-07 VITALS — BP 125/82 | HR 74 | Temp 97.9°F | Resp 16 | Wt 201.6 lb

## 2018-02-07 DIAGNOSIS — W19XXXA Unspecified fall, initial encounter: Secondary | ICD-10-CM

## 2018-02-07 DIAGNOSIS — M25521 Pain in right elbow: Secondary | ICD-10-CM | POA: Diagnosis not present

## 2018-02-07 DIAGNOSIS — Z1239 Encounter for other screening for malignant neoplasm of breast: Secondary | ICD-10-CM | POA: Diagnosis not present

## 2018-02-07 DIAGNOSIS — E119 Type 2 diabetes mellitus without complications: Secondary | ICD-10-CM | POA: Diagnosis not present

## 2018-02-07 DIAGNOSIS — I130 Hypertensive heart and chronic kidney disease with heart failure and stage 1 through stage 4 chronic kidney disease, or unspecified chronic kidney disease: Secondary | ICD-10-CM

## 2018-02-07 DIAGNOSIS — E7800 Pure hypercholesterolemia, unspecified: Secondary | ICD-10-CM | POA: Diagnosis not present

## 2018-02-07 DIAGNOSIS — Z1211 Encounter for screening for malignant neoplasm of colon: Secondary | ICD-10-CM | POA: Diagnosis not present

## 2018-02-07 DIAGNOSIS — N183 Chronic kidney disease, stage 3 unspecified: Secondary | ICD-10-CM

## 2018-02-07 DIAGNOSIS — E559 Vitamin D deficiency, unspecified: Secondary | ICD-10-CM

## 2018-02-07 DIAGNOSIS — S59901A Unspecified injury of right elbow, initial encounter: Secondary | ICD-10-CM | POA: Diagnosis not present

## 2018-02-07 DIAGNOSIS — E039 Hypothyroidism, unspecified: Secondary | ICD-10-CM | POA: Diagnosis not present

## 2018-02-07 DIAGNOSIS — Z23 Encounter for immunization: Secondary | ICD-10-CM

## 2018-02-07 DIAGNOSIS — M8589 Other specified disorders of bone density and structure, multiple sites: Secondary | ICD-10-CM | POA: Diagnosis not present

## 2018-02-07 NOTE — Telephone Encounter (Signed)
Patient husband was advised as directed below.

## 2018-02-07 NOTE — Telephone Encounter (Signed)
-----   Message from Mar Daring, PA-C sent at 02/07/2018  3:53 PM EST ----- Area of concern seen in only one view and was very subtle. Not conclusive of a fracture. Recommend to ice , rest and use IBU or aleve for pain/inflammation. If pain persists for 2 weeks please call the office and we will re-image.

## 2018-02-08 LAB — COMPREHENSIVE METABOLIC PANEL
ALBUMIN: 4 g/dL (ref 3.5–4.8)
ALK PHOS: 73 IU/L (ref 39–117)
ALT: 25 IU/L (ref 0–32)
AST: 39 IU/L (ref 0–40)
Albumin/Globulin Ratio: 1.5 (ref 1.2–2.2)
BILIRUBIN TOTAL: 0.6 mg/dL (ref 0.0–1.2)
BUN / CREAT RATIO: 13 (ref 12–28)
BUN: 21 mg/dL (ref 8–27)
CHLORIDE: 98 mmol/L (ref 96–106)
CO2: 23 mmol/L (ref 20–29)
Calcium: 9.6 mg/dL (ref 8.7–10.3)
Creatinine, Ser: 1.58 mg/dL — ABNORMAL HIGH (ref 0.57–1.00)
GFR calc Af Amer: 37 mL/min/{1.73_m2} — ABNORMAL LOW (ref >59)
GFR calc non Af Amer: 32 mL/min/{1.73_m2} — ABNORMAL LOW (ref >59)
GLOBULIN, TOTAL: 2.6 g/dL (ref 1.5–4.5)
GLUCOSE: 129 mg/dL — AB (ref 65–99)
POTASSIUM: 4 mmol/L (ref 3.5–5.2)
SODIUM: 139 mmol/L (ref 134–144)
Total Protein: 6.6 g/dL (ref 6.0–8.5)

## 2018-02-08 LAB — CBC WITH DIFFERENTIAL/PLATELET
Basophils Absolute: 0.1 10*3/uL (ref 0.0–0.2)
Basos: 1 %
EOS (ABSOLUTE): 0.4 10*3/uL (ref 0.0–0.4)
EOS: 4 %
HEMATOCRIT: 36.6 % (ref 34.0–46.6)
HEMOGLOBIN: 12.5 g/dL (ref 11.1–15.9)
IMMATURE GRANULOCYTES: 1 %
Immature Grans (Abs): 0.1 10*3/uL (ref 0.0–0.1)
LYMPHS ABS: 3.1 10*3/uL (ref 0.7–3.1)
LYMPHS: 30 %
MCH: 34 pg — ABNORMAL HIGH (ref 26.6–33.0)
MCHC: 34.2 g/dL (ref 31.5–35.7)
MCV: 100 fL — ABNORMAL HIGH (ref 79–97)
MONOCYTES: 8 %
Monocytes Absolute: 0.8 10*3/uL (ref 0.1–0.9)
NEUTROS PCT: 56 %
Neutrophils Absolute: 5.9 10*3/uL (ref 1.4–7.0)
Platelets: 276 10*3/uL (ref 150–450)
RBC: 3.68 x10E6/uL — AB (ref 3.77–5.28)
RDW: 12.9 % (ref 12.3–15.4)
WBC: 10.3 10*3/uL (ref 3.4–10.8)

## 2018-02-08 LAB — LIPID PANEL
CHOL/HDL RATIO: 4 ratio (ref 0.0–4.4)
Cholesterol, Total: 170 mg/dL (ref 100–199)
HDL: 42 mg/dL (ref >39)
LDL CALC: 79 mg/dL (ref 0–99)
Triglycerides: 247 mg/dL — ABNORMAL HIGH (ref 0–149)
VLDL Cholesterol Cal: 49 mg/dL — ABNORMAL HIGH (ref 5–40)

## 2018-02-08 LAB — HEMOGLOBIN A1C
Est. average glucose Bld gHb Est-mCnc: 131 mg/dL
Hgb A1c MFr Bld: 6.2 % — ABNORMAL HIGH (ref 4.8–5.6)

## 2018-02-08 LAB — MAGNESIUM: MAGNESIUM: 1.2 mg/dL — AB (ref 1.6–2.3)

## 2018-02-08 LAB — TSH: TSH: 1.32 u[IU]/mL (ref 0.450–4.500)

## 2018-02-08 LAB — VITAMIN D 25 HYDROXY (VIT D DEFICIENCY, FRACTURES): VIT D 25 HYDROXY: 68.6 ng/mL (ref 30.0–100.0)

## 2018-02-10 ENCOUNTER — Telehealth: Payer: Self-pay

## 2018-02-10 NOTE — Telephone Encounter (Signed)
-----   Message from Mar Daring, [place] sent at 02/10/2018  8:05 AM EST ----- Kidney function has continued to improve. Keep yourself well hydrated. Liver enzymes are normal. A1c down to 6.2 from 6.4. Thyroid is normal. Cholesterol fairly stable from last year. Hemoglobin doing well, up from last year. Magnesium is low. Would recommend taking 500mg  daily OTC. Vit D is normal.

## 2018-02-10 NOTE — Telephone Encounter (Signed)
Patient was advise. 

## 2018-02-20 ENCOUNTER — Telehealth: Payer: Self-pay | Admitting: Physician Assistant

## 2018-02-20 DIAGNOSIS — E113393 Type 2 diabetes mellitus with moderate nonproliferative diabetic retinopathy without macular edema, bilateral: Secondary | ICD-10-CM | POA: Diagnosis not present

## 2018-02-20 NOTE — Telephone Encounter (Signed)
Bone density was ordered for patient using diagnosis of osteopenia.Per [employer] her insurance will not cover.Can diagnosis of estrogen deficiency or post menopausal be used ? If so will just need diagnosis code you want me to use,Thanks

## 2018-02-21 NOTE — Telephone Encounter (Signed)
Yes that is ok.   N95.1 please

## 2018-02-26 ENCOUNTER — Other Ambulatory Visit: Payer: Self-pay | Admitting: Physician Assistant

## 2018-02-26 DIAGNOSIS — R609 Edema, unspecified: Secondary | ICD-10-CM

## 2018-02-28 DIAGNOSIS — Z1211 Encounter for screening for malignant neoplasm of colon: Secondary | ICD-10-CM | POA: Diagnosis not present

## 2018-03-05 LAB — COLOGUARD: Cologuard: NEGATIVE

## 2018-03-10 DIAGNOSIS — Z780 Asymptomatic menopausal state: Secondary | ICD-10-CM | POA: Diagnosis not present

## 2018-03-10 LAB — HM DEXA SCAN: HM Dexa Scan: NORMAL

## 2018-03-14 ENCOUNTER — Other Ambulatory Visit: Payer: Self-pay | Admitting: Physician Assistant

## 2018-03-14 DIAGNOSIS — M10471 Other secondary gout, right ankle and foot: Secondary | ICD-10-CM

## 2018-03-27 ENCOUNTER — Other Ambulatory Visit: Payer: PPO

## 2018-04-02 IMAGING — CR DG CHEST 2V
1 series · 2 of 2 positions shown · non-contrast
Comparison: None available; prior exam on the time line from
02/18/1996 is not available for comparison.

CLINICAL DATA: Productive cough for 1 week, diabetes mellitus,
asthma, hypertension, nonsmoker

EXAM:
CHEST  2 VIEW

[Series 1: dg chest 2 view · 0.14mm/px · 2 of 2 slices shown]
[im 1/2]
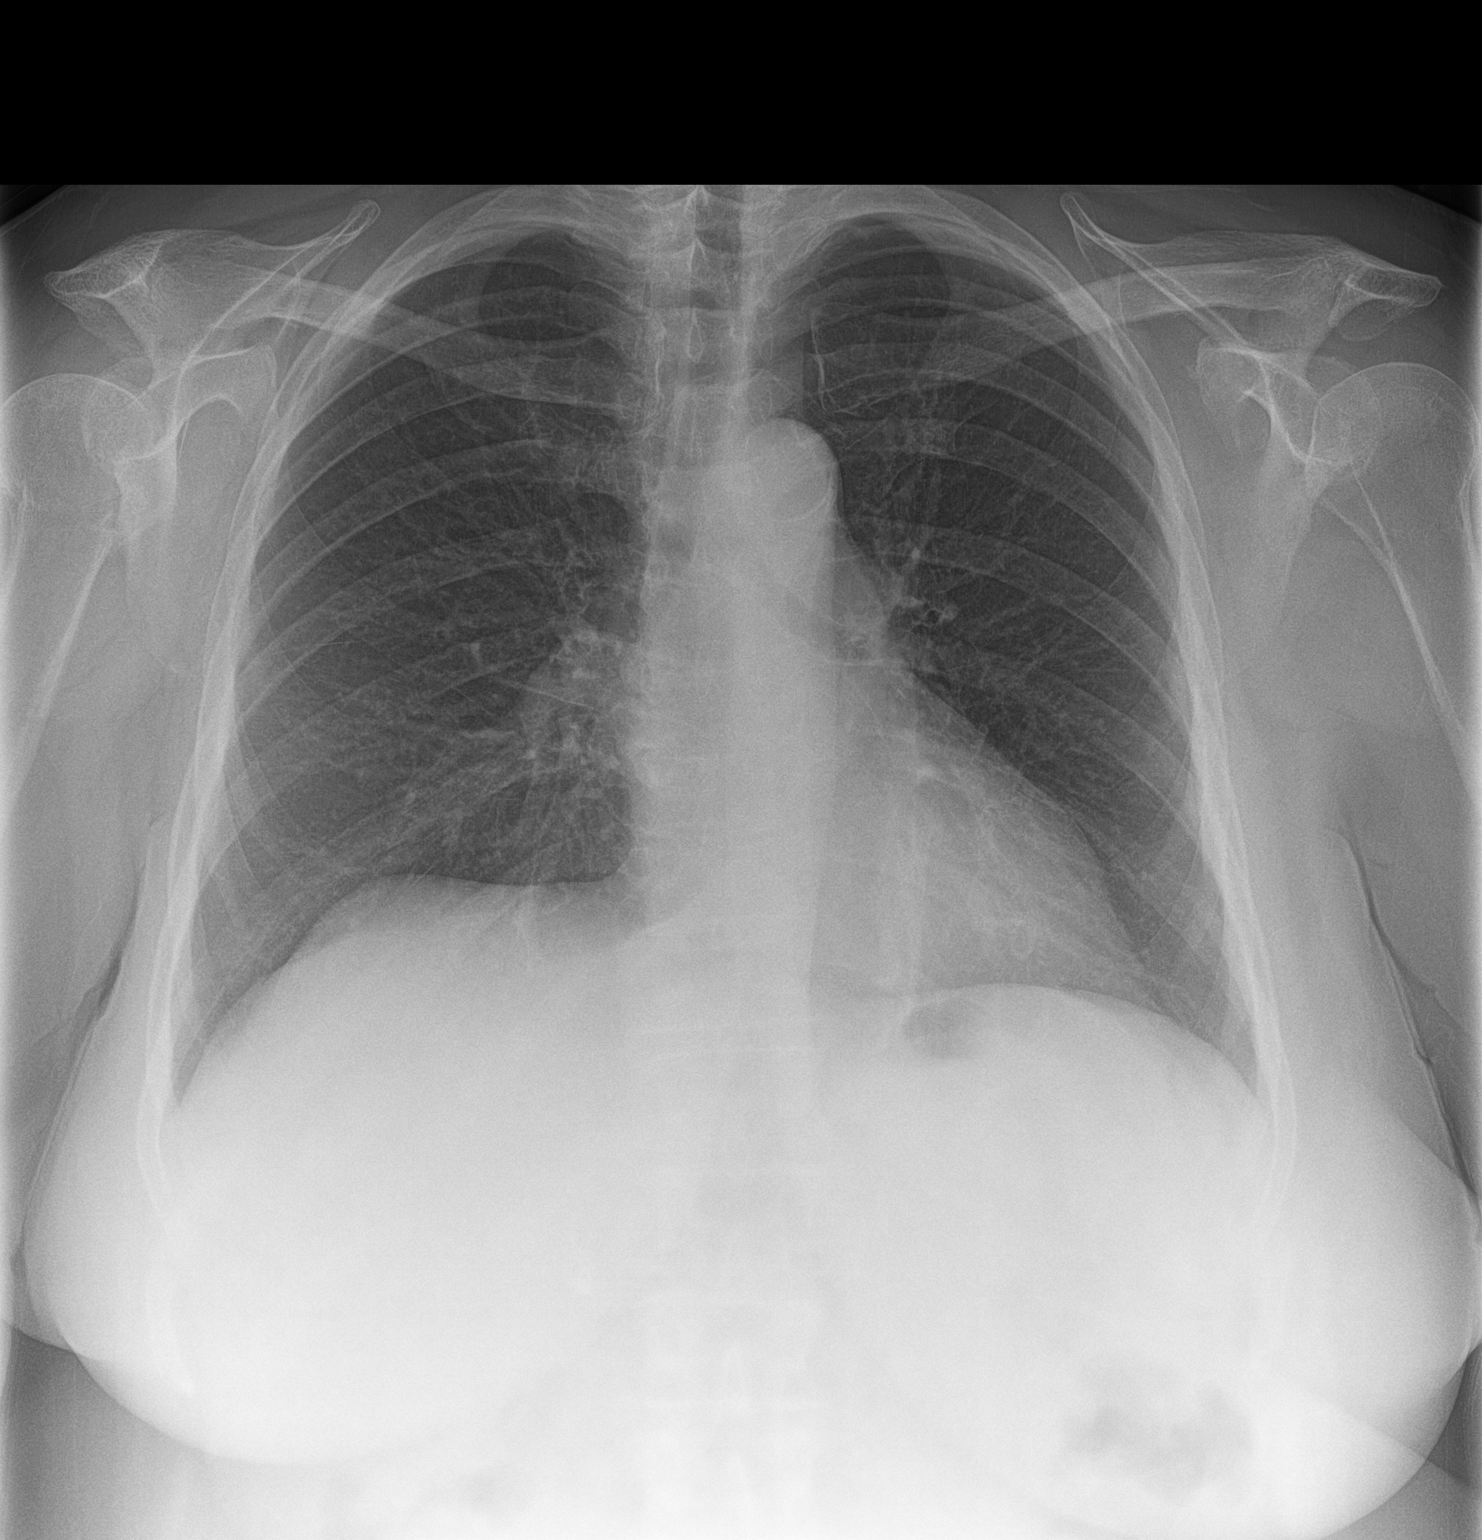
[im 2/2]
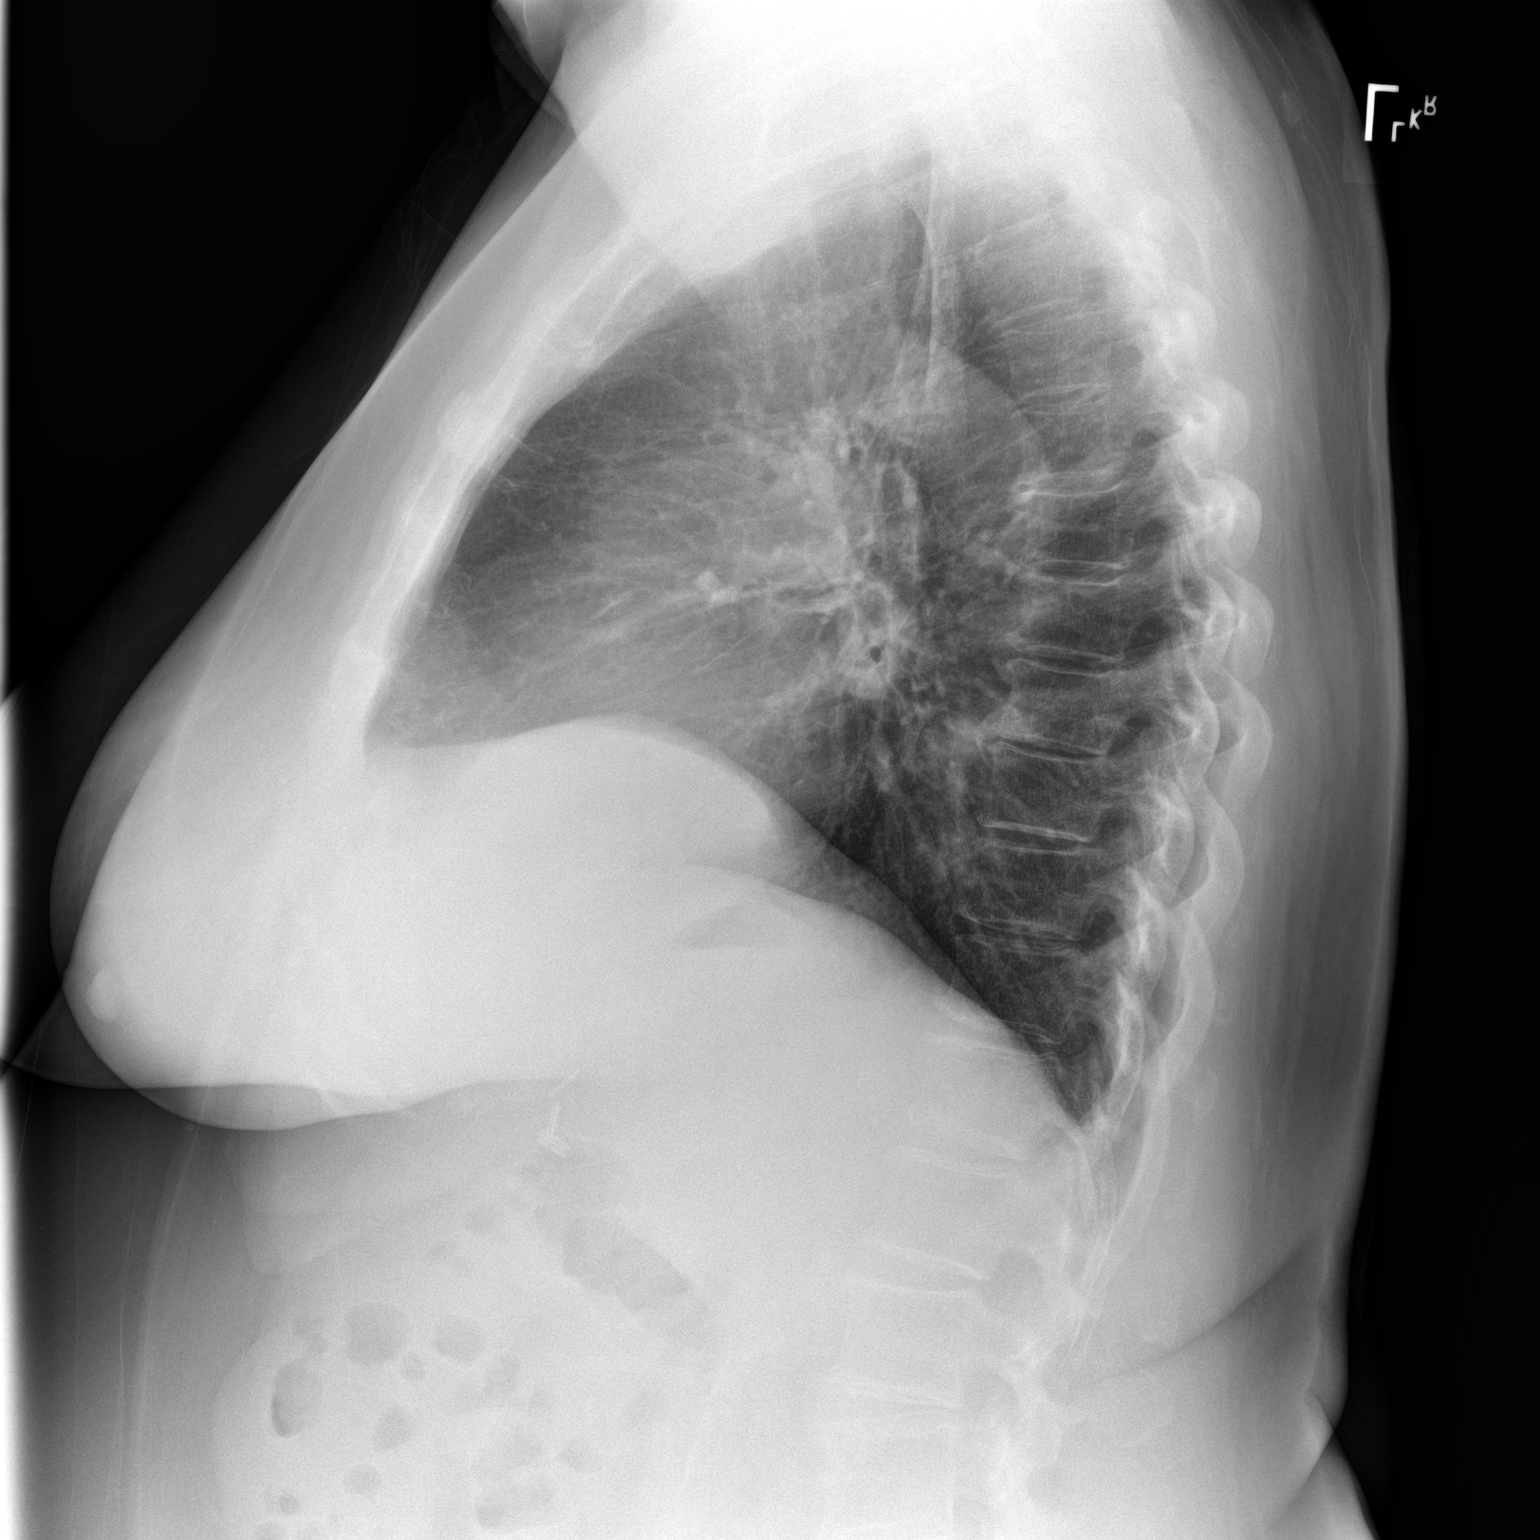

[2 of 2 positions shown; findings below may reference images not displayed]

FINDINGS: Normal heart size, mediastinal contours, and pulmonary vascularity.

Atherosclerotic calcification aorta.

Minimal bronchitic changes without pulmonary infiltrate, pleural
effusion or pneumothorax.

No acute osseous findings.

Surgical clips RIGHT upper quadrant question question
cholecystectomy.
IMPRESSION: Minimal bronchitic changes without acute infiltrate.

## 2018-04-14 ENCOUNTER — Encounter: Payer: Self-pay | Admitting: Physician Assistant

## 2018-04-14 ENCOUNTER — Ambulatory Visit (INDEPENDENT_AMBULATORY_CARE_PROVIDER_SITE_OTHER): Payer: PPO

## 2018-04-14 ENCOUNTER — Ambulatory Visit (INDEPENDENT_AMBULATORY_CARE_PROVIDER_SITE_OTHER): Payer: PPO | Admitting: Physician Assistant

## 2018-04-14 VITALS — BP 128/72 | HR 80 | Temp 99.0°F | Ht 63.0 in | Wt 201.6 lb

## 2018-04-14 DIAGNOSIS — Z1239 Encounter for other screening for malignant neoplasm of breast: Secondary | ICD-10-CM | POA: Diagnosis not present

## 2018-04-14 DIAGNOSIS — F330 Major depressive disorder, recurrent, mild: Secondary | ICD-10-CM | POA: Diagnosis not present

## 2018-04-14 DIAGNOSIS — E039 Hypothyroidism, unspecified: Secondary | ICD-10-CM | POA: Diagnosis not present

## 2018-04-14 DIAGNOSIS — N183 Chronic kidney disease, stage 3 unspecified: Secondary | ICD-10-CM

## 2018-04-14 DIAGNOSIS — Z6835 Body mass index (BMI) 35.0-35.9, adult: Secondary | ICD-10-CM

## 2018-04-14 DIAGNOSIS — Z0000 Encounter for general adult medical examination without abnormal findings: Secondary | ICD-10-CM

## 2018-04-14 DIAGNOSIS — E119 Type 2 diabetes mellitus without complications: Secondary | ICD-10-CM | POA: Diagnosis not present

## 2018-04-14 DIAGNOSIS — E559 Vitamin D deficiency, unspecified: Secondary | ICD-10-CM

## 2018-04-14 DIAGNOSIS — F5101 Primary insomnia: Secondary | ICD-10-CM

## 2018-04-14 DIAGNOSIS — E1122 Type 2 diabetes mellitus with diabetic chronic kidney disease: Secondary | ICD-10-CM | POA: Diagnosis not present

## 2018-04-14 DIAGNOSIS — I10 Essential (primary) hypertension: Secondary | ICD-10-CM | POA: Diagnosis not present

## 2018-04-14 DIAGNOSIS — E7800 Pure hypercholesterolemia, unspecified: Secondary | ICD-10-CM

## 2018-04-14 DIAGNOSIS — K219 Gastro-esophageal reflux disease without esophagitis: Secondary | ICD-10-CM

## 2018-04-14 DIAGNOSIS — F3342 Major depressive disorder, recurrent, in full remission: Secondary | ICD-10-CM | POA: Insufficient documentation

## 2018-04-14 DIAGNOSIS — I129 Hypertensive chronic kidney disease with stage 1 through stage 4 chronic kidney disease, or unspecified chronic kidney disease: Secondary | ICD-10-CM | POA: Diagnosis not present

## 2018-04-14 DIAGNOSIS — I130 Hypertensive heart and chronic kidney disease with heart failure and stage 1 through stage 4 chronic kidney disease, or unspecified chronic kidney disease: Secondary | ICD-10-CM | POA: Diagnosis not present

## 2018-04-14 MED ORDER — FAMOTIDINE 40 MG PO TABS: 40.0000 mg | ORAL_TABLET | Freq: Every day | ORAL | 1 refills | Status: DC

## 2018-04-14 NOTE — Progress Notes (Signed)
Subjective:   Donna Malone is a 73 y.o. female who presents for Medicare Annual (Subsequent) preventive examination.  Review of Systems:  N/A  Cardiac Risk Factors include: advanced age (>51men, >43 women);diabetes mellitus;dyslipidemia;hypertension     Objective:     Vitals: BP 128/72 (BP Location: Right Arm)   Pulse 80   Temp 99 F (37.2 C) (Oral)   Ht 5\' 3"  (1.6 m)   Wt 201 lb 9.6 oz (91.4 kg)   BMI 35.71 kg/m   Body mass index is 35.71 kg/m.  Advanced Directives 04/14/2018 04/04/2017 03/20/2016 10/29/2015 02/09/2015 11/08/2014  Does Patient Have a Medical Advance Directive? Yes No Yes;No No No Yes  Type of Academic librarian;Living will - - - - Press photographer  Does patient want to make changes to medical advance directive? - - No - Patient declined - - -  Copy of [place] in Chart? No - copy requested - - - - -  Would patient like information on creating a medical advance directive? No - Patient declined No - Patient declined - No - patient declined information - -    Tobacco Social History   Tobacco Use  Smoking Status Never Smoker  Smokeless Tobacco Never Used     Counseling given: Not Answered   Clinical Intake:  Pre-visit preparation completed: Yes  Pain : No/denies pain Pain Score: 0-No pain    Diabetes:  Is the patient diabetic?  Yes type 2 If diabetic, was a CBG obtained today?  No  Did the patient bring in their glucometer from home?  No  How often do you monitor your CBG's? Once daily.   Financial Strains and Diabetes Management:  Are you having any financial strains with the device, your supplies or your medication? No .  Does the patient want to be seen by Chronic Care Management for management of their diabetes?  No  Would the patient like to be referred to a Nutritionist or for Diabetic Management?  No   Diabetic Exams:  Diabetic Eye Exam: Completed 12/2016 per pt. Overdue  for diabetic eye exam. Pt has been advised about the importance in completing this exam. Pt has had eye exam scheduled for 04/22/18.  Diabetic Foot Exam: Completed 05/23/16. Pt has been advised about the importance in completing this exam. Note made to have this completed today.    Nutritional Status: BMI > 30  Obese Nutritional Risks: Nausea/ vomitting/ diarrhea(Diarrhea occasionally possbily due to Metformin. ) Diabetes: Yes   How often do you need to have someone help you when you read instructions, pamphlets, or other written materials from your doctor or pharmacy?: 1 - Never  Interpreter Needed?: No  Information entered by :: [facility], LPN  Past Medical History:  Diagnosis Date  . Asthma   . Bone spur    heel  . Depression   . Gout   . Hypertension    Past Surgical History:  Procedure Laterality Date  . ABDOMINAL HYSTERECTOMY  04/1970   partial  . CHOLECYSTECTOMY  late 1990's   Family History  Problem Relation Age of Onset  . CAD Mother   . Heart attack Mother   . Heart disease Father   . Diabetes Cousin    Social History   Socioeconomic History  . Marital status: Married    Spouse name: [name]  . Number of children: 3  . Years of education: Not on file  . Highest education level: 12th  grade  Occupational History  . Occupation: retired  Scientific laboratory technician  . Financial resource strain: Not hard at all  . Food insecurity:    Worry: Never true    Inability: Never true  . Transportation needs:    Medical: No    Non-medical: No  Tobacco Use  . Smoking status: Never Smoker  . Smokeless tobacco: Never Used  Substance and Sexual Activity  . Alcohol use: No  . Drug use: No  . Sexual activity: Not on file  Lifestyle  . Physical activity:    Days per week: 0 days    Minutes per session: 0 min  . Stress: To some extent  Relationships  . Social connections:    Talks on phone: Patient refused    Gets together: Patient refused    Attends religious service:  Patient refused    Active member of club or organization: Patient refused    Attends meetings of clubs or organizations: Patient refused    Relationship status: Patient refused  Other Topics Concern  . Not on file  Social History Narrative  . Not on file    Outpatient Encounter Medications as of 04/14/2018  Medication Sig  . Acetaminophen (EQ ARTHRITIS PAIN PO) Take 1 tablet by mouth 2 (two) times daily.  Marland Kitchen allopurinol (ZYLOPRIM) 100 MG tablet TAKE 1 TABLET (100 MG TOTAL) BY MOUTH DAILY.  Marland Kitchen atorvastatin (LIPITOR) 40 MG tablet Take 1 tablet by mouth  daily  . B Complex Vitamins (GNP VITAMIN B COMPLEX PO) Take by mouth daily.   . colchicine 0.6 MG tablet TAKE 1 TABLET BY MOUTH TWICE A DAY (Patient taking differently: TAKE 1 TABLET BY MOUTH TWICE A DAY as needed)  . escitalopram (LEXAPRO) 20 MG tablet Take 1 tablet (20 mg total) by mouth daily.  . ferrous sulfate 325 (65 FE) MG EC tablet Take 325 mg by mouth daily.  . furosemide (LASIX) 40 MG tablet TAKE 1 TABLET (40 MG TOTAL) BY MOUTH DAILY.  Marland Kitchen glipiZIDE (GLUCOTROL XL) 5 MG 24 hr tablet Take 1 tablet (5 mg total) by mouth daily with breakfast.  . glucose blood test strip OneTouch Ultra Blue Test Strip  . levothyroxine (SYNTHROID, LEVOTHROID) 75 MCG tablet TAKE 1 TABLET BY MOUTH EVERY DAY  . magnesium 30 MG tablet Take 30 mg by mouth daily. Unsure of dose  . metFORMIN (GLUCOPHAGE) 1000 MG tablet Take 1 tablet (1,000 mg total) by mouth 2 (two) times daily with a meal.  . ONE TOUCH ULTRA TEST test strip TEST BLOOD SUGAR ONCE DAILY  . ranitidine (ZANTAC) 150 MG tablet Take 1 tablet (150 mg total) by mouth 2 (two) times daily. (Patient taking differently: Take 150 mg by mouth 2 (two) times daily. Uses as needed)  . traZODone (DESYREL) 100 MG tablet Take 1 tablet by mouth at  bedtime  . Vitamin D, Ergocalciferol, (DRISDOL) 50000 units CAPS capsule TAKE ONE CAPSULE BY MOUTH ONE TIME PER WEEK  . aspirin 81 MG tablet Take 81 mg by mouth daily.   .  meloxicam (MOBIC) 15 MG tablet TAKE 1 TABLET BY MOUTH EVERY DAY WITH MEALS  . potassium chloride (MICRO-K) 10 MEQ CR capsule TAKE 1 CAPSULE BY MOUTH DAILY (Patient not taking: Reported on 04/14/2018)  . traMADol (ULTRAM) 50 MG tablet TAKE ONE TABLET EVERY SIX HOURS AS NEEDED FOR PAIN   No facility-administered encounter medications on file as of 04/14/2018.     Activities of Daily Living In your present state of health, do you  have any difficulty performing the following activities: 04/14/2018  Hearing? N  Vision? N  Comment Wears eye glasses.   Difficulty concentrating or making decisions? Y  Walking or climbing stairs? Y  Comment Due to SOB and some pain in back.   Dressing or bathing? N  Doing errands, shopping? N  Preparing Food and eating ? N  Using the Toilet? N  In the past six months, have you accidently leaked urine? N  Do you have problems with loss of bowel control? N  Managing your Medications? N  Managing your Finances? N  Housekeeping or managing your Housekeeping? N  Some recent data might be hidden    Patient Care Team: Mar Daring, PA-C as PCP - General (Family Medicine) Lorelee Cover., MD as Consulting Physician (Ophthalmology) Lovell Sheehan, MD as Consulting Physician (Orthopedic Surgery)    Assessment:   This is a routine wellness examination for Delcenia.  Exercise Activities and Dietary recommendations Current Exercise Habits: The patient does not participate in regular exercise at present, Exercise limited by: orthopedic condition(s)  Goals    . Exercise 150 minutes per week (moderate activity)     Recommend to start back with water aerobics for 3 days a week for 55 minutes.     . Increase water intake     Starting 03/20/16, I will increase my water intake to 6 glasses a day.    . Peak Blood Glucose < 180       Fall Risk Fall Risk  04/14/2018 02/07/2018 04/09/2017 04/04/2017 11/20/2016  Falls in the past year? 1 1 No No No  Number falls in  past yr: 0 0 - - -  Injury with Fall? 0 0 - - -  Follow up Falls prevention discussed - - - -   FALL RISK PREVENTION PERTAINING TO THE HOME:  Any stairs in or around the home WITH handrails? Yes  Home free of loose throw rugs in walkways, pet beds, electrical cords, etc? Yes  Adequate lighting in your home to reduce risk of falls? Yes   ASSISTIVE DEVICES UTILIZED TO PREVENT FALLS:  Life alert? No  Use of a cane, walker or w/c? Yes  Grab bars in the bathroom? No  Shower chair or bench in shower? Yes  Elevated toilet seat or a handicapped toilet? No    TIMED UP AND GO:  Was the test performed? No .     Depression Screen PHQ 2/9 Scores 04/14/2018 04/04/2017 11/20/2016 11/20/2016  PHQ - 2 Score 2 1 0 0  PHQ- 9 Score 11 - 8 -     Cognitive Function     6CIT Screen 04/14/2018 03/20/2016  What Year? 0 points 0 points  What month? 0 points 0 points  What time? 0 points 0 points  Count back from 20 0 points 0 points  Months in reverse 0 points 0 points  Repeat phrase 0 points 2 points  Total Score 0 2    Immunization History  Administered Date(s) Administered  . Influenza Split 12/16/2009, 03/21/2011, 01/30/2012  . Influenza, High Dose Seasonal PF 02/09/2015, 03/20/2016, 12/24/2016, 02/07/2018  . Influenza,inj,Quad PF,6+ Mos 02/06/2013, 12/03/2013  . Pneumococcal Conjugate-13 12/03/2013  . Pneumococcal Polysaccharide-23 03/21/2011  . Tdap 04/09/2008    Qualifies for Shingles Vaccine? Yes . Due for Shingrix. Education has been provided regarding the importance of this vaccine. Pt has been advised to call insurance company to determine out of pocket expense. Advised may also receive vaccine at local pharmacy  or Health Dept. Verbalized acceptance and understanding.  Tdap: Although this vaccine is not a covered service during a Wellness Exam, does the patient still wish to receive this vaccine today?  No .  Education has been provided regarding the importance of this vaccine.  Advised may receive this vaccine at local pharmacy or Health Dept. Aware to provide a copy of the vaccination record if obtained from local pharmacy or Health Dept. Verbalized acceptance and understanding.  Flu Vaccine: Up to date  Pneumococcal Vaccine: Up to date   Screening Tests Health Maintenance  Topic Date Due  . OPHTHALMOLOGY EXAM  03/01/2017  . FOOT EXAM  05/23/2017  . TETANUS/TDAP  04/09/2018  . URINE MICROALBUMIN  04/09/2018  . MAMMOGRAM  04/18/2018  . HEMOGLOBIN A1C  08/08/2018  . Fecal DNA (Cologuard)  02/28/2021  . INFLUENZA VACCINE  Completed  . DEXA SCAN  Completed  . Hepatitis C Screening  Completed  . PNA vac Low Risk Adult  Completed    Cancer Screenings:  Colorectal Screening: Cologuard completed 02/28/18. Repeat every 3 years.  Mammogram: Completed 04/18/17.   Bone Density: Completed 03/10/18 . Results reflect NORMAL.  Lung Cancer Screening: (Low Dose CT Chest recommended if Age 84-80 years, 30 pack-year currently smoking OR have quit w/in 15years.) does not qualify.    Additional Screening:  Hepatitis C Screening: Up to date  Vision Screening: Recommended annual ophthalmology exams for early detection of glaucoma and other disorders of the eye.  Dental Screening: Recommended annual dental exams for proper oral hygiene  Community Resource Referral:  CRR required this visit?  No       Plan:  I have personally reviewed and addressed the Medicare Annual Wellness questionnaire and have noted the following in the patient's chart:  A. Medical and social history B. Use of alcohol, tobacco or illicit drugs  C. Current medications and supplements D. Functional ability and status E.  Nutritional status F.  Physical activity G. Advance directives H. List of other physicians I.  Hospitalizations, surgeries, and ER visits in previous 12 months J.  Frederic such as hearing and vision if needed, cognitive and depression L. Referrals and  appointments - none  In addition, I have reviewed and discussed with patient certain preventive protocols, quality metrics, and best practice recommendations. A written personalized care plan for preventive services as well as general preventive health recommendations were provided to patient.  See attached scanned questionnaire for additional information.   Signed,  Fabio Neighbors, LPN Nurse Health Advisor   Nurse Recommendations: Pt needs a diabetic foot exam and urine check today. Pt has an eye exam scheduled 04/22/18. Pt declined the tetanus vaccine today.

## 2018-04-14 NOTE — Progress Notes (Signed)
Patient: Donna Malone, Female    DOB: Dec 20, 1945, 73 y.o.   MRN: [id number] Visit Date: 04/14/2018  Today's Provider: Mar Daring, PA-C   Chief Complaint  Patient presents with  . Annual Exam   Subjective:   Patient saw McKenzie today at 10:40 am for AWV.    Complete Physical Donna Malone is a 73 y.o. female. She feels fairly well. She reports exercising none. She reports she is sleeping poorly. She is taking trazodone 100mg  but reports it has not been helping recently. Reports she had back issues and she was in bed a lot with her back. She would sleep through the day and that has messed up her circadian rhythm. She reports yesterday was the first day she has stayed up all day, but reports she did wake up later in the morning.  -----------------------------------------------------------   Review of Systems  Constitutional: Positive for activity change, appetite change, chills, diaphoresis and fatigue.  HENT: Negative.   Eyes: Positive for itching.  Respiratory: Positive for cough and shortness of breath.   Cardiovascular: Negative.   Gastrointestinal: Positive for diarrhea.  Endocrine: Positive for cold intolerance and heat intolerance.  Genitourinary: Negative.   Musculoskeletal: Positive for arthralgias, back pain and neck pain.  Skin: Negative.   Allergic/Immunologic: Negative.   Neurological: Positive for weakness.  Hematological: Negative.   Psychiatric/Behavioral: Negative.     Social History   Socioeconomic History  . Marital status: Married    Spouse name: [name]  . Number of children: 3  . Years of education: Not on file  . Highest education level: 12th grade  Occupational History  . Occupation: retired  Scientific laboratory technician  . Financial resource strain: Not hard at all  . Food insecurity:    Worry: Never true    Inability: Never true  . Transportation needs:    Medical: No    Non-medical: No  Tobacco Use  . Smoking status: Never  Smoker  . Smokeless tobacco: Never Used  Substance and Sexual Activity  . Alcohol use: No  . Drug use: No  . Sexual activity: Not on file  Lifestyle  . Physical activity:    Days per week: 0 days    Minutes per session: 0 min  . Stress: To some extent  Relationships  . Social connections:    Talks on phone: Patient refused    Gets together: Patient refused    Attends religious service: Patient refused    Active member of club or organization: Patient refused    Attends meetings of clubs or organizations: Patient refused    Relationship status: Patient refused  . Intimate partner violence:    Fear of current or ex partner: No    Emotionally abused: No    Physically abused: No    Forced sexual activity: No  Other Topics Concern  . Not on file  Social History Narrative  . Not on file    Past Medical History:  Diagnosis Date  . Asthma   . Bone spur    heel  . Depression   . Gout   . Hypertension      Patient Active Problem List   Diagnosis Date Noted  . Bronchitis 09/19/2015  . Hypertension 04/01/2015  . Abnormal finding on mammography 09/21/2014  . Hypothyroidism 09/21/2014  . Adult body mass index 37.0-37.9 09/21/2014  . Chronic kidney disease, stage III (moderate) ([place]) 09/21/2014  . Type II diabetes mellitus ([place]) 09/21/2014  . Depressive  disorder 09/21/2014  . Accumulation of fluid in tissues 09/21/2014  . Esophageal reflux 09/21/2014  . Gout 09/21/2014  . Hypercholesteremia 09/21/2014  . Heart & renal disease, hypertensive, with heart failure ([place]) 09/21/2014  . Insomnia 09/21/2014  . Vitamin D deficiency, unspecified 09/21/2014  . Low back pain 01/15/2014  . Other chest pain 01/15/2014  . Atrophic vaginitis 01/15/2014  . Hypertensive chronic kidney disease 01/15/2014    Past Surgical History:  Procedure Laterality Date  . ABDOMINAL HYSTERECTOMY  04/1970   partial  . CHOLECYSTECTOMY  late 1990's    Her family history includes CAD in her mother;  Diabetes in her cousin; Heart attack in her mother; Heart disease in her father.      Current Outpatient Medications:  .  Acetaminophen (EQ ARTHRITIS PAIN PO), Take 1 tablet by mouth 2 (two) times daily., Disp: , Rfl:  .  allopurinol (ZYLOPRIM) 100 MG tablet, TAKE 1 TABLET (100 MG TOTAL) BY MOUTH DAILY., Disp: 90 tablet, Rfl: 1 .  aspirin 81 MG tablet, Take 81 mg by mouth daily. , Disp: , Rfl:  .  atorvastatin (LIPITOR) 40 MG tablet, Take 1 tablet by mouth  daily, Disp: 90 tablet, Rfl: 3 .  B Complex Vitamins (GNP VITAMIN B COMPLEX PO), Take by mouth daily. , Disp: , Rfl:  .  colchicine 0.6 MG tablet, TAKE 1 TABLET BY MOUTH TWICE A DAY (Patient taking differently: TAKE 1 TABLET BY MOUTH TWICE A DAY as needed), Disp: 60 tablet, Rfl: 5 .  escitalopram (LEXAPRO) 20 MG tablet, Take 1 tablet (20 mg total) by mouth daily., Disp: 90 tablet, Rfl: 3 .  ferrous sulfate 325 (65 FE) MG EC tablet, Take 325 mg by mouth daily., Disp: , Rfl:  .  furosemide (LASIX) 40 MG tablet, TAKE 1 TABLET (40 MG TOTAL) BY MOUTH DAILY., Disp: 90 tablet, Rfl: 1 .  glipiZIDE (GLUCOTROL XL) 5 MG 24 hr tablet, Take 1 tablet (5 mg total) by mouth daily with breakfast., Disp: 90 tablet, Rfl: 3 .  glucose blood test strip, OneTouch Ultra Blue Test Strip, Disp: , Rfl:  .  levothyroxine (SYNTHROID, LEVOTHROID) 75 MCG tablet, TAKE 1 TABLET BY MOUTH EVERY DAY, Disp: 90 tablet, Rfl: 1 .  magnesium 30 MG tablet, Take 30 mg by mouth daily. Unsure of dose, Disp: , Rfl:  .  meloxicam (MOBIC) 15 MG tablet, TAKE 1 TABLET BY MOUTH EVERY DAY WITH MEALS, Disp: , Rfl: 1 .  metFORMIN (GLUCOPHAGE) 1000 MG tablet, Take 1 tablet (1,000 mg total) by mouth 2 (two) times daily with a meal., Disp: 180 tablet, Rfl: 2 .  ONE TOUCH ULTRA TEST test strip, TEST BLOOD SUGAR ONCE DAILY, Disp: 100 each, Rfl: 4 .  potassium chloride (MICRO-K) 10 MEQ CR capsule, TAKE 1 CAPSULE BY MOUTH DAILY, Disp: 90 capsule, Rfl: 3 .  ranitidine (ZANTAC) 150 MG tablet, Take 1  tablet (150 mg total) by mouth 2 (two) times daily. (Patient taking differently: Take 150 mg by mouth 2 (two) times daily. Uses as needed), Disp: 180 tablet, Rfl: 3 .  traMADol (ULTRAM) 50 MG tablet, TAKE ONE TABLET EVERY SIX HOURS AS NEEDED FOR PAIN, Disp: , Rfl: 0 .  traZODone (DESYREL) 100 MG tablet, Take 1 tablet by mouth at  bedtime, Disp: 90 tablet, Rfl: 3 .  Vitamin D, Ergocalciferol, (DRISDOL) 50000 units CAPS capsule, TAKE ONE CAPSULE BY MOUTH ONE TIME PER WEEK, Disp: 12 capsule, Rfl: 1  Patient Care Team: Rubye Beach as PCP -  General (Family Medicine) Lorelee Cover., MD as Consulting Physician (Ophthalmology) Lovell Sheehan, MD as Consulting Physician (Orthopedic Surgery)     Objective:    Vitals: BP 128/72 (BP Location: Right Arm)   Pulse 80   Temp 99 F (37.2 C) (Oral)   Ht 5\' 3"  (1.6 m)   Wt 201 lb 9.6 oz (91.4 kg)   BMI 35.71 kg/m  BSA 2.02 m  Physical Exam Vitals signs reviewed.  Constitutional:      General: She is not in acute distress.    Appearance: She is well-developed. She is not diaphoretic.  HENT:     Head: Normocephalic and atraumatic.     Right Ear: Hearing, tympanic membrane, ear canal and external ear normal.     Left Ear: Hearing, tympanic membrane, ear canal and external ear normal.     Nose: Nose normal.     Mouth/Throat:     Lips: Pink.     Mouth: Mucous membranes are moist.     Dentition: Has dentures.     Palate: No mass and lesions.     Pharynx: Oropharynx is clear. Uvula midline. No oropharyngeal exudate or posterior oropharyngeal erythema.  Eyes:     General: No scleral icterus.       Right eye: No discharge.        Left eye: No discharge.     Conjunctiva/sclera: Conjunctivae normal.     Pupils: Pupils are equal, round, and reactive to light.  Neck:     Musculoskeletal: Normal range of motion and neck supple.     Thyroid: No thyromegaly.     Vascular: No carotid bruit or JVD.     Trachea: No tracheal deviation.    Cardiovascular:     Rate and Rhythm: Normal rate and regular rhythm.     Heart sounds: Normal heart sounds. No murmur. No friction rub. No gallop.   Pulmonary:     Effort: Pulmonary effort is normal. No respiratory distress.     Breath sounds: Normal breath sounds. No wheezing or rales.  Chest:     Chest wall: No tenderness.  Abdominal:     General: Bowel sounds are normal. There is no distension.     Palpations: Abdomen is soft. There is no mass.     Tenderness: There is no abdominal tenderness. There is no guarding or rebound.  Musculoskeletal: Normal range of motion.        General: No tenderness.  Lymphadenopathy:     Cervical: No cervical adenopathy.  Skin:    General: Skin is warm and dry.     Findings: No rash.  Neurological:     Mental Status: She is alert and oriented to person, place, and time.  Psychiatric:        Behavior: Behavior normal.        Thought Content: Thought content normal.        Judgment: Judgment normal.     Activities of Daily Living In your present state of health, do you have any difficulty performing the following activities: 04/14/2018  Hearing? N  Vision? N  Comment Wears eye glasses.   Difficulty concentrating or making decisions? Y  Walking or climbing stairs? Y  Comment Due to SOB and some pain in back.   Dressing or bathing? N  Doing errands, shopping? N  Preparing Food and eating ? N  Using the Toilet? N  In the past six months, have you accidently leaked urine? N  Do you have  problems with loss of bowel control? N  Managing your Medications? N  Managing your Finances? N  Housekeeping or managing your Housekeeping? N  Some recent data might be hidden    Fall Risk Assessment Fall Risk  04/14/2018 02/07/2018 04/09/2017 04/04/2017 11/20/2016  Falls in the past year? 1 1 No No No  Number falls in past yr: 0 0 - - -  Injury with Fall? 0 0 - - -  Follow up Falls prevention discussed - - - -     Depression Screen PHQ 2/9 Scores  04/14/2018 04/04/2017 11/20/2016 11/20/2016  PHQ - 2 Score 2 1 0 0  PHQ- 9 Score 11 - 8 -   Audit-C Alcohol Use Screening   Alcohol Use Disorder Test (AUDIT) 04/14/2018  1. How often do you have a drink containing alcohol? 0  2. How many drinks containing alcohol do you have on a typical day when you are drinking? 0  3. How often do you have six or more drinks on one occasion? 0  AUDIT-C Score 0    A score of 3 or more in women, and 4 or more in men indicates increased risk for alcohol abuse, EXCEPT if all of the points are from question 1   6CIT Screen 04/14/2018  What Year? 0 points  What month? 0 points  What time? 0 points  Count back from 20 0 points  Months in reverse 0 points  Repeat phrase 0 points  Total Score 0      Assessment & Plan:    Annual Physical Reviewed patient's Family Medical History Reviewed and updated list of patient's medical providers Assessment of cognitive impairment was done Assessed patient's functional ability Established a written schedule for health screening [place] Completed and Reviewed  Exercise Activities and Dietary recommendations Goals    . Exercise 150 minutes per week (moderate activity)     Recommend to start back with water aerobics for 3 days a week for 55 minutes.     . Increase water intake     Starting 03/20/16, I will increase my water intake to 6 glasses a day.    . Peak Blood Glucose < 180       Immunization History  Administered Date(s) Administered  . Influenza Split 12/16/2009, 03/21/2011, 01/30/2012  . Influenza, High Dose Seasonal PF 02/09/2015, 03/20/2016, 12/24/2016, 02/07/2018  . Influenza,inj,Quad PF,6+ Mos 02/06/2013, 12/03/2013  . Pneumococcal Conjugate-13 12/03/2013  . Pneumococcal Polysaccharide-23 03/21/2011  . Tdap 04/09/2008    Health Maintenance  Topic Date Due  . OPHTHALMOLOGY EXAM  03/01/2017  . FOOT EXAM  05/23/2017  . TETANUS/TDAP  04/09/2018  . URINE MICROALBUMIN   04/09/2018  . MAMMOGRAM  04/18/2018  . HEMOGLOBIN A1C  08/08/2018  . Fecal DNA (Cologuard)  02/28/2021  . INFLUENZA VACCINE  Completed  . DEXA SCAN  Completed  . Hepatitis C Screening  Completed  . PNA vac Low Risk Adult  Completed     Discussed health benefits of physical activity, and encouraged her to engage in regular exercise appropriate for her age and condition.    1. Annual physical exam Normal physical exam today. Will check labs as below and f/u pending lab results. If labs are stable and WNL she will not need to have these rechecked for one year at her next annual physical exam. She is to call the office in the meantime if she has any acute issue, questions or concerns.  2. Breast cancer screening There is  no family history of breast cancer. She does perform regular self breast exams. Mammogram was ordered as below. Information for [facility] was given to patient so she may schedule her mammogram at her convenience. - MM 3D SCREEN BREAST BILATERAL; Future  3. Essential hypertension Stable and well controlled without medications. Will check labs as below and f/u pending results. - CBC w/Diff/Platelet - Comprehensive Metabolic Panel (CMET) - Lipid Profile - HgB A1c  4. Acquired hypothyroidism Stable on levothyroxine 47mcg. Will check labs as below and f/u pending results. - TSH  5. Type 2 diabetes mellitus with stage 3 chronic kidney disease, without long-term current use of insulin (HCC) Stable. Continue metformin 1000mg  BID, glipizide 5mg  daily. Patient unable to void for microalbumin. Will see her back and try to get microalbumin in 6 months. Will check labs as below and f/u pending results. - CBC w/Diff/Platelet - Comprehensive Metabolic Panel (CMET) - Lipid Profile - HgB A1c  6. Chronic kidney disease, stage III (moderate) (HCC) Previously stable. Not on Ace or ARB. Will check labs as below and f/u pending results. - CBC w/Diff/Platelet -  Comprehensive Metabolic Panel (CMET) - Lipid Profile - HgB A1c  7. Vitamin D deficiency, unspecified H/O and post menopausal. On high dose supplement. Will check labs as below and f/u pending results. - CBC w/Diff/Platelet - Comprehensive Metabolic Panel (CMET) - Vitamin D (25 hydroxy)  8. Primary insomnia Advised she may increase trazodone to 150 or 200mg  to see if this will help sleep issues. Call if no improvements.  - CBC w/Diff/Platelet - Comprehensive Metabolic Panel (CMET)  9. Class 2 severe obesity due to excess calories with serious comorbidity and body mass index (BMI) of 35.0 to 35.9 in adult [facility]) Counseled patient on healthy lifestyle modifications including dieting and exercise.  - CBC w/Diff/Platelet - Comprehensive Metabolic Panel (CMET) - Lipid Profile - HgB A1c  10. Hypercholesteremia Stable on atorvastatin 40mg . Will check labs as below and f/u pending results. - CBC w/Diff/Platelet - Comprehensive Metabolic Panel (CMET) - Lipid Profile - HgB A1c  11. Mild episode of recurrent major depressive disorder ([place]) PHQ9 up today to 11. Reports having some close friends become ill or pass recently. This has worsened her symptoms but she declines changing lexapro 20mg  at this time.   12. Gastroesophageal reflux disease without esophagitis Changed from ranitidine to famotidine due to recall on ranitidine.  - famotidine (PEPCID) 40 MG tablet; Take 1 tablet (40 mg total) by mouth daily.  Dispense: 90 tablet; Refill: 1  ---------------------------------------------------------------------------    Mar Daring, PA-C  [facility]

## 2018-04-14 NOTE — Patient Instructions (Addendum)
Donna Malone , Thank you for taking time to come for your Medicare Wellness Visit. I appreciate your ongoing commitment to your health goals. Please review the following plan we discussed and let me know if I can assist you in the future.   Screening recommendations/referrals: Colonoscopy: Cologuard due 02/28/21 Mammogram: Up to date, due 04/2019 Bone Density: Up to date Recommended yearly ophthalmology/optometry visit for glaucoma screening and checkup Recommended yearly dental visit for hygiene and checkup  Vaccinations: Influenza vaccine: Up to date Pneumococcal vaccine: Completed series Tdap vaccine: Pt declines today.  Shingles vaccine: Pt declines today.     Advanced directives: Please bring a copy of your POA (Power of Attorney) and/or Living Will to your next appointment.   Conditions/risks identified: Obesity; Fall risk prevention; Continue trying to increase water intake to 6-8 8 oz glasses daily.   Next appointment: 11:40 AM today with [name].    Preventive Care 20 Years and Older, Female Preventive care refers to lifestyle choices and visits with your health care provider that can promote health and wellness. What does preventive care include?  A yearly physical exam. This is also called an annual well check.  Dental exams once or twice a year.  Routine eye exams. Ask your health care provider how often you should have your eyes checked.  Personal lifestyle choices, including:  Daily care of your teeth and gums.  Regular physical activity.  Eating a healthy diet.  Avoiding tobacco and drug use.  Limiting alcohol use.  Practicing safe sex.  Taking low-dose aspirin every day.  Taking vitamin and mineral supplements as recommended by your health care provider. What happens during an annual well check? The services and screenings done by your health care provider during your annual well check will depend on your age, overall health, lifestyle risk  factors, and family history of disease. Counseling  Your health care provider may ask you questions about your:  Alcohol use.  Tobacco use.  Drug use.  Emotional well-being.  Home and relationship well-being.  Sexual activity.  Eating habits.  History of falls.  Memory and ability to understand (cognition).  Work and work Statistician.  Reproductive health. Screening  You may have the following tests or measurements:  Height, weight, and BMI.  Blood pressure.  Lipid and cholesterol levels. These may be checked every 5 years, or more frequently if you are over 69 years old.  Skin check.  Lung cancer screening. You may have this screening every year starting at age 3 if you have a 30-pack-year history of smoking and currently smoke or have quit within the past 15 years.  Fecal occult blood test (FOBT) of the stool. You may have this test every year starting at age 52.  Flexible sigmoidoscopy or colonoscopy. You may have a sigmoidoscopy every 5 years or a colonoscopy every 10 years starting at age 76.  Hepatitis C blood test.  Hepatitis B blood test.  Sexually transmitted disease (STD) testing.  Diabetes screening. This is done by checking your blood sugar (glucose) after you have not eaten for a while (fasting). You may have this done every 1-3 years.  Bone density scan. This is done to screen for osteoporosis. You may have this done starting at age 9.  Mammogram. This may be done every 1-2 years. Talk to your health care provider about how often you should have regular mammograms. Talk with your health care provider about your test results, treatment options, and if necessary, the need for more  tests. Vaccines  Your health care provider may recommend certain vaccines, such as:  Influenza vaccine. This is recommended every year.  Tetanus, diphtheria, and acellular pertussis (Tdap, Td) vaccine. You may need a Td booster every 10 years.  Zoster vaccine. You  may need this after age 88.  Pneumococcal 13-valent conjugate (PCV13) vaccine. One dose is recommended after age 56.  Pneumococcal polysaccharide (PPSV23) vaccine. One dose is recommended after age 34. Talk to your health care provider about which screenings and vaccines you need and how often you need them. This information is not intended to replace advice given to you by your health care provider. Make sure you discuss any questions you have with your health care provider. Document Released: 04/15/2015 Document Revised: 12/07/2015 Document Reviewed: 01/18/2015 Elsevier Interactive Patient Education  2017 [place] Prevention in the Home Falls can cause injuries. They can happen to people of all ages. There are many things you can do to make your home safe and to help prevent falls. What can I do on the outside of my home?  Regularly fix the edges of walkways and driveways and fix any cracks.  Remove anything that might make you trip as you walk through a door, such as a raised step or threshold.  Trim any bushes or trees on the path to your home.  Use bright outdoor lighting.  Clear any walking paths of anything that might make someone trip, such as rocks or tools.  Regularly check to see if handrails are loose or broken. Make sure that both sides of any steps have handrails.  Any raised decks and porches should have guardrails on the edges.  Have any leaves, snow, or ice cleared regularly.  Use sand or salt on walking paths during winter.  Clean up any spills in your garage right away. This includes oil or grease spills. What can I do in the bathroom?  Use night lights.  Install grab bars by the toilet and in the tub and shower. Do not use towel bars as grab bars.  Use non-skid mats or decals in the tub or shower.  If you need to sit down in the shower, use a plastic, non-slip stool.  Keep the floor dry. Clean up any water that spills on the floor as soon as  it happens.  Remove soap buildup in the tub or shower regularly.  Attach bath mats securely with double-sided non-slip rug tape.  Do not have throw rugs and other things on the floor that can make you trip. What can I do in the bedroom?  Use night lights.  Make sure that you have a light by your bed that is easy to reach.  Do not use any sheets or blankets that are too big for your bed. They should not hang down onto the floor.  Have a firm chair that has side arms. You can use this for support while you get dressed.  Do not have throw rugs and other things on the floor that can make you trip. What can I do in the kitchen?  Clean up any spills right away.  Avoid walking on wet floors.  Keep items that you use a lot in easy-to-reach places.  If you need to reach something above you, use a strong step stool that has a grab bar.  Keep electrical cords out of the way.  Do not use floor polish or wax that makes floors slippery. If you must use wax, use non-skid floor  wax.  Do not have throw rugs and other things on the floor that can make you trip. What can I do with my stairs?  Do not leave any items on the stairs.  Make sure that there are handrails on both sides of the stairs and use them. Fix handrails that are broken or loose. Make sure that handrails are as long as the stairways.  Check any carpeting to make sure that it is firmly attached to the stairs. Fix any carpet that is loose or worn.  Avoid having throw rugs at the top or bottom of the stairs. If you do have throw rugs, attach them to the floor with carpet tape.  Make sure that you have a light switch at the top of the stairs and the bottom of the stairs. If you do not have them, ask someone to add them for you. What else can I do to help prevent falls?  Wear shoes that:  Do not have high heels.  Have rubber bottoms.  Are comfortable and fit you well.  Are closed at the toe. Do not wear sandals.  If  you use a stepladder:  Make sure that it is fully opened. Do not climb a closed stepladder.  Make sure that both sides of the stepladder are locked into place.  Ask someone to hold it for you, if possible.  Clearly mark and make sure that you can see:  Any grab bars or handrails.  First and last steps.  Where the edge of each step is.  Use tools that help you move around (mobility aids) if they are needed. These include:  Canes.  Walkers.  Scooters.  Crutches.  Turn on the lights when you go into a dark area. Replace any light bulbs as soon as they burn out.  Set up your furniture so you have a clear path. Avoid moving your furniture around.  If any of your floors are uneven, fix them.  If there are any pets around you, be aware of where they are.  Review your medicines with your doctor. Some medicines can make you feel dizzy. This can increase your chance of falling. Ask your doctor what other things that you can do to help prevent falls. This information is not intended to replace advice given to you by your health care provider. Make sure you discuss any questions you have with your health care provider. Document Released: 01/13/2009 Document Revised: 08/25/2015 Document Reviewed: 04/23/2014 Elsevier Interactive Patient Education  2017 [employer].

## 2018-04-14 NOTE — Patient Instructions (Signed)
Health Maintenance After Age 75 After age 75, you are at a higher risk for certain long-term diseases and infections as well as injuries from falls. Falls are a major cause of broken bones and head injuries in people who are older than age 75. Getting regular preventive care can help to keep you healthy and well. Preventive care includes getting regular testing and making lifestyle changes as recommended by your health care provider. Talk with your health care provider about:  Which screenings and tests you should have. A screening is a test that checks for a disease when you have no symptoms.  A diet and exercise plan that is right for you. What should I know about screenings and tests to prevent falls? Screening and testing are the best ways to find a health problem early. Early diagnosis and treatment give you the best chance of managing medical conditions that are common after age 75. Certain conditions and lifestyle choices may make you more likely to have a fall. Your health care provider may recommend:  Regular vision checks. Poor vision and conditions such as cataracts can make you more likely to have a fall. If you wear glasses, make sure to get your prescription updated if your vision changes.  Medicine review. Work with your health care provider to regularly review all of the medicines you are taking, including over-the-counter medicines. Ask your health care provider about any side effects that may make you more likely to have a fall. Tell your health care provider if any medicines that you take make you feel dizzy or sleepy.  Osteoporosis screening. Osteoporosis is a condition that causes the bones to get weaker. This can make the bones weak and cause them to break more easily.  Blood pressure screening. Blood pressure changes and medicines to control blood pressure can make you feel dizzy.  Strength and balance checks. Your health care provider may recommend certain tests to check your  strength and balance while standing, walking, or changing positions.  Foot health exam. Foot pain and numbness, as well as not wearing proper footwear, can make you more likely to have a fall.  Depression screening. You may be more likely to have a fall if you have a fear of falling, feel emotionally low, or feel unable to do activities that you used to do.  Alcohol use screening. Using too much alcohol can affect your balance and may make you more likely to have a fall. What actions can I take to lower my risk of falls? General instructions  Talk with your health care provider about your risks for falling. Tell your health care provider if: ? You fall. Be sure to tell your health care provider about all falls, even ones that seem minor. ? You feel dizzy, sleepy, or off-balance.  Take over-the-counter and prescription medicines only as told by your health care provider. These include any supplements.  Eat a healthy diet and maintain a healthy weight. A healthy diet includes low-fat dairy products, low-fat (lean) meats, and fiber from whole grains, beans, and lots of fruits and vegetables. Home safety  Remove any tripping hazards, such as rugs, cords, and clutter.  Install safety equipment such as grab bars in bathrooms and safety rails on stairs.  Keep rooms and walkways well-lit. Activity   Follow a regular exercise program to stay fit. This will help you maintain your balance. Ask your health care provider what types of exercise are appropriate for you.  If you need a cane or   walker, use it as recommended by your health care provider.  Wear supportive shoes that have nonskid soles. Lifestyle  Do not drink alcohol if your health care provider tells you not to drink.  If you drink alcohol, limit how much you have: ? 0-1 drink a day for women. ? 0-2 drinks a day for men.  Be aware of how much alcohol is in your drink. In the [place] one drink equals one typical bottle of beer (12  oz), one-half glass of wine (5 oz), or one shot of hard liquor (1 oz).  Do not use any products that contain nicotine or tobacco, such as cigarettes and e-cigarettes. If you need help quitting, ask your health care provider. Summary  Having a healthy lifestyle and getting preventive care can help to protect your health and wellness after age 75.  Screening and testing are the best way to find a health problem early and help you avoid having a fall. Early diagnosis and treatment give you the best chance for managing medical conditions that are more common for people who are older than age 75.  Falls are a major cause of broken bones and head injuries in people who are older than age 75. Take precautions to prevent a fall at home.  Work with your health care provider to learn what changes you can make to improve your health and wellness and to prevent falls. This information is not intended to replace advice given to you by your health care provider. Make sure you discuss any questions you have with your health care provider. Document Released: 01/30/2017 Document Revised: 01/30/2017 Document Reviewed: 01/30/2017 Elsevier Interactive Patient Education  2019 Elsevier Inc.  

## 2018-04-15 LAB — HEMOGLOBIN A1C
Est. average glucose Bld gHb Est-mCnc: 140 mg/dL
Hgb A1c MFr Bld: 6.5 % — ABNORMAL HIGH (ref 4.8–5.6)

## 2018-04-15 LAB — CBC WITH DIFFERENTIAL/PLATELET
Basophils Absolute: 0.1 10*3/uL (ref 0.0–0.2)
Basos: 1 %
EOS (ABSOLUTE): 0.5 10*3/uL — ABNORMAL HIGH (ref 0.0–0.4)
Eos: 5 %
Hematocrit: 36.4 % (ref 34.0–46.6)
Hemoglobin: 12.6 g/dL (ref 11.1–15.9)
Immature Grans (Abs): 0 10*3/uL (ref 0.0–0.1)
Immature Granulocytes: 0 %
LYMPHS ABS: 3.2 10*3/uL — AB (ref 0.7–3.1)
Lymphs: 35 %
MCH: 33.7 pg — AB (ref 26.6–33.0)
MCHC: 34.6 g/dL (ref 31.5–35.7)
MCV: 97 fL (ref 79–97)
Monocytes Absolute: 0.7 10*3/uL (ref 0.1–0.9)
Monocytes: 8 %
NEUTROS ABS: 4.8 10*3/uL (ref 1.4–7.0)
Neutrophils: 51 %
Platelets: 251 10*3/uL (ref 150–450)
RBC: 3.74 x10E6/uL — ABNORMAL LOW (ref 3.77–5.28)
RDW: 12.9 % (ref 11.7–15.4)
WBC: 9.3 10*3/uL (ref 3.4–10.8)

## 2018-04-15 LAB — COMPREHENSIVE METABOLIC PANEL
ALT: 24 IU/L (ref 0–32)
AST: 33 IU/L (ref 0–40)
Albumin/Globulin Ratio: 1.2 (ref 1.2–2.2)
Albumin: 3.7 g/dL (ref 3.5–4.8)
Alkaline Phosphatase: 76 IU/L (ref 39–117)
BUN/Creatinine Ratio: 10 — ABNORMAL LOW (ref 12–28)
BUN: 14 mg/dL (ref 8–27)
Bilirubin Total: 0.3 mg/dL (ref 0.0–1.2)
CO2: 21 mmol/L (ref 20–29)
Calcium: 9.9 mg/dL (ref 8.7–10.3)
Chloride: 102 mmol/L (ref 96–106)
Creatinine, Ser: 1.45 mg/dL — ABNORMAL HIGH (ref 0.57–1.00)
GFR calc Af Amer: 41 mL/min/{1.73_m2} — ABNORMAL LOW (ref >59)
GFR calc non Af Amer: 36 mL/min/{1.73_m2} — ABNORMAL LOW (ref >59)
Globulin, Total: 3.1 g/dL (ref 1.5–4.5)
Glucose: 132 mg/dL — ABNORMAL HIGH (ref 65–99)
Potassium: 4.1 mmol/L (ref 3.5–5.2)
Sodium: 140 mmol/L (ref 134–144)
Total Protein: 6.8 g/dL (ref 6.0–8.5)

## 2018-04-15 LAB — LIPID PANEL
Chol/HDL Ratio: 8.4 ratio — ABNORMAL HIGH (ref 0.0–4.4)
Cholesterol, Total: 310 mg/dL — ABNORMAL HIGH (ref 100–199)
HDL: 37 mg/dL — ABNORMAL LOW (ref >39)
Triglycerides: 587 mg/dL (ref 0–149)

## 2018-04-15 LAB — VITAMIN D 25 HYDROXY (VIT D DEFICIENCY, FRACTURES): Vit D, 25-Hydroxy: 46.5 ng/mL (ref 30.0–100.0)

## 2018-04-15 LAB — TSH: TSH: 1.24 u[IU]/mL (ref 0.450–4.500)

## 2018-04-16 ENCOUNTER — Other Ambulatory Visit: Payer: Self-pay | Admitting: Physician Assistant

## 2018-04-16 ENCOUNTER — Telehealth: Payer: Self-pay

## 2018-04-16 DIAGNOSIS — E7800 Pure hypercholesterolemia, unspecified: Secondary | ICD-10-CM

## 2018-04-16 NOTE — Telephone Encounter (Signed)
Patient has been advised. KW 

## 2018-04-16 NOTE — Telephone Encounter (Signed)
-----   Message from Mar Daring, PA-C sent at 04/16/2018  8:10 AM EST ----- Blood count is stable when compared to last year. Kidney function stable, actually slight improvements seen. Continue hydration. Liver enzymes normal. Thyroid is normal. Cholesterol is elevated. Make sure to be taking atorvastatin daily. I do think it is also elevated due to this not being a fasting lab. A1c is up slightly to 6.5 from 6.4 one year ago, and was 6.2 2 months ago. Make sure to continue medications and follow diabetic dieting habits. Vit D is normal. Continue Vit d supplement.

## 2018-04-22 DIAGNOSIS — E113393 Type 2 diabetes mellitus with moderate nonproliferative diabetic retinopathy without macular edema, bilateral: Secondary | ICD-10-CM | POA: Diagnosis not present

## 2018-04-22 DIAGNOSIS — Z1231 Encounter for screening mammogram for malignant neoplasm of breast: Secondary | ICD-10-CM | POA: Diagnosis not present

## 2018-04-22 LAB — HM MAMMOGRAPHY

## 2018-04-25 ENCOUNTER — Encounter: Payer: Self-pay | Admitting: *Deleted

## 2018-05-01 ENCOUNTER — Ambulatory Visit: Payer: PPO | Admitting: Family Medicine

## 2018-05-03 ENCOUNTER — Other Ambulatory Visit: Payer: Self-pay | Admitting: Physician Assistant

## 2018-05-03 DIAGNOSIS — E559 Vitamin D deficiency, unspecified: Secondary | ICD-10-CM

## 2018-05-04 ENCOUNTER — Other Ambulatory Visit: Payer: Self-pay | Admitting: Physician Assistant

## 2018-05-04 DIAGNOSIS — F3342 Major depressive disorder, recurrent, in full remission: Secondary | ICD-10-CM

## 2018-05-12 NOTE — Progress Notes (Signed)
[facility] [address] [place], [place] [address] Phone: [phone] Subjective:    I'm seeing this patient by the request  of:    CC: Back pain, knee pain, hip pain  [id number]  Donna Malone is a 73 y.o. female coming in with complaint of back and hip pain. States that she has pain in all her joints. Has had injection in the left hip and was told she had inflammation. Back arthritis. States her pain is so bad she sometimes uses her husband's walker. No numbness and tingling noted.   Onset- lower back  Location all over more the low back and bilateral knees medially Duration-has been continuous for many years Character- sharp, dull, achy, sore, throbbing Aggravating factors- flexion, side bending, rotation Reliving factors- Cat's claw  Therapies tried-  Severity-9 out of 10     Past Medical History:  Diagnosis Date  . Asthma   . Bone spur    heel  . Depression   . Gout   . Hypertension    Past Surgical History:  Procedure Laterality Date  . ABDOMINAL HYSTERECTOMY  04/1970   partial  . CHOLECYSTECTOMY  late 1990's   Social History   Socioeconomic History  . Marital status: Married    Spouse name: [name]  . Number of children: 3  . Years of education: Not on file  . Highest education level: 12th grade  Occupational History  . Occupation: retired  Scientific laboratory technician  . Financial resource strain: Not hard at all  . Food insecurity:    Worry: Never true    Inability: Never true  . Transportation needs:    Medical: No    Non-medical: No  Tobacco Use  . Smoking status: Never Smoker  . Smokeless tobacco: Never Used  Substance and Sexual Activity  . Alcohol use: No  . Drug use: No  . Sexual activity: Not on file  Lifestyle  . Physical activity:    Days per week: 0 days    Minutes per session: 0 min  . Stress: To some extent  Relationships  . Social connections:    Talks on phone: Patient refused    Gets together:  Patient refused    Attends religious service: Patient refused    Active member of club or organization: Patient refused    Attends meetings of clubs or organizations: Patient refused    Relationship status: Patient refused  Other Topics Concern  . Not on file  Social History Narrative  . Not on file   Allergies  Allergen Reactions  . Sulfa Antibiotics    Family History  Problem Relation Age of Onset  . CAD Mother   . Heart attack Mother   . Heart disease Father   . Diabetes Cousin     Current Outpatient Medications (Endocrine & Metabolic):  .  glipiZIDE (GLUCOTROL XL) 5 MG 24 hr tablet, Take 1 tablet (5 mg total) by mouth daily with breakfast. .  levothyroxine (SYNTHROID, LEVOTHROID) 75 MCG tablet, TAKE 1 TABLET BY MOUTH EVERY DAY .  metFORMIN (GLUCOPHAGE) 1000 MG tablet, Take 1 tablet (1,000 mg total) by mouth 2 (two) times daily with a meal.  Current Outpatient Medications (Cardiovascular):  .  atorvastatin (LIPITOR) 40 MG tablet, TAKE 1 TABLET BY MOUTH EVERY DAY .  furosemide (LASIX) 40 MG tablet, TAKE 1 TABLET (40 MG TOTAL) BY MOUTH DAILY.   Current Outpatient Medications (Analgesics):  Marland Kitchen  Acetaminophen (EQ ARTHRITIS PAIN PO), Take 1 tablet  by mouth 2 (two) times daily. Marland Kitchen  allopurinol (ZYLOPRIM) 100 MG tablet, TAKE 1 TABLET (100 MG TOTAL) BY MOUTH DAILY. Marland Kitchen  colchicine 0.6 MG tablet, TAKE 1 TABLET BY MOUTH TWICE A DAY (Patient taking differently: TAKE 1 TABLET BY MOUTH TWICE A DAY as needed) .  aspirin 81 MG tablet, Take 81 mg by mouth daily.  .  meloxicam (MOBIC) 15 MG tablet, TAKE 1 TABLET BY MOUTH EVERY DAY WITH MEALS .  traMADol (ULTRAM) 50 MG tablet, TAKE ONE TABLET EVERY SIX HOURS AS NEEDED FOR PAIN  Current Outpatient Medications (Hematological):  .  ferrous sulfate 325 (65 FE) MG EC tablet, Take 325 mg by mouth daily.  Current Outpatient Medications (Other):  Marland Kitchen  B Complex Vitamins (GNP VITAMIN B COMPLEX PO), Take by mouth daily.  Marland Kitchen  escitalopram (LEXAPRO) 20 MG  tablet, TAKE 1 TABLET BY MOUTH EVERY DAY .  glucose blood test strip, OneTouch Ultra Blue Test Strip .  magnesium 30 MG tablet, Take 30 mg by mouth daily. Unsure of dose .  ONE TOUCH ULTRA TEST test strip, TEST BLOOD SUGAR ONCE DAILY .  traZODone (DESYREL) 100 MG tablet, Take 1 tablet by mouth at  bedtime .  Vitamin D, Ergocalciferol, (DRISDOL) 1.25 MG (50000 UT) CAPS capsule, TAKE ONE CAPSULE BY MOUTH ONCE A WEEK .  famotidine (PEPCID) 40 MG tablet, Take 1 tablet (40 mg total) by mouth daily. (Patient not taking: Reported on 05/13/2018) .  potassium chloride (MICRO-K) 10 MEQ CR capsule, TAKE 1 CAPSULE BY MOUTH DAILY (Patient not taking: Reported on 05/13/2018)    Past medical history, social, surgical and family history all reviewed in electronic medical record.  No pertanent information unless stated regarding to the chief complaint.   Review of Systems:  No headache, visual changes, nausea, vomiting, diarrhea, constipation, dizziness, abdominal pain, skin rash, fevers, chills, night sweats, weight loss, swollen lymph nodes,  joint swelling,  chest pain, shortness of breath, mood changes.  Positive muscle aches, body aches  Objective  Blood pressure 110/70, pulse 78, height 5\' 3"  (1.6 m), weight 204 lb (92.5 kg), SpO2 96 %.    General: No apparent distress alert and oriented x3 mood and affect normal, dressed appropriately.  HEENT: Pupils equal, extraocular movements intact  Respiratory: Patient's speak in full sentences and does not appear short of breath  Cardiovascular: No lower extremity edema, non tender, no erythema  Skin: Warm dry intact with no signs of infection or rash on extremities or on axial skeleton.  Abdomen: Soft nontender [place] Neuro: Cranial nerves II through XII are intact, neurovascularly intact in all extremities with 2+ DTRs and 2+ pulses.  Lymph: No lymphadenopathy of posterior or anterior cervical chain or axillae bilaterally.  Gait antalgic MSK:  tender with  limited range of motion and instability and symmetric strength and tone of shoulders, elbows, wrist, hip, and ankles bilaterally.   Back exam shows the patient does have some loss of lordosis and some degenerative scoliosis.  Patient does have some weakness noted as well.  This is of the core strength.  Patient has 4-5 strength of the lower extremities.  Seems to be neurovascularly intact though.  Patient does have severe tenderness to palpation that seems to be out of proportion to the amount of force being used.  Seems to be diffuse.  Even up in the cervical region.     Knee: Bilateral valgus deformity noted.  Abnormal thigh to calf ratio.  Tender to palpation over medial and PF  joint line.  ROM full in flexion and extension and lower leg rotation. instability with valgus force.  painful patellar compression. Patellar glide with moderate crepitus. Patellar and quadriceps tendons unremarkable. Hamstring and quadriceps strength is normal.  [id number]; 15 additional minutes spent for Therapeutic exercises as stated in above notes.  This included exercises focusing on stretching, strengthening, with significant focus on eccentric aspects.   Long term goals include an improvement in range of motion, strength, endurance as well as avoiding reinjury. Patient's frequency would include in 1-2 times a day, 3-5 times a week for a duration of 6-12 weeks. Low back exercises that included:  Pelvic tilt/bracing instruction to focus on control of the pelvic girdle and lower abdominal muscles  Glute strengthening exercises, focusing on proper firing of the glutes without engaging the low back muscles Proper stretching techniques for maximum relief for the hamstrings, hip flexors, low back and some rotation where tolerated   Proper technique shown and discussed handout in great detail with ATC.  All questions were discussed and answered.    Impression and Recommendations:     This case required medical decision  making of moderate complexity. The above documentation has been reviewed and is accurate and complete Lyndal Pulley, DO       Note: This dictation was prepared with Dragon dictation along with smaller phrase technology. Any transcriptional errors that result from this process are unintentional.

## 2018-05-13 ENCOUNTER — Ambulatory Visit: Payer: PPO | Admitting: Family Medicine

## 2018-05-13 ENCOUNTER — Ambulatory Visit (INDEPENDENT_AMBULATORY_CARE_PROVIDER_SITE_OTHER)
Admission: RE | Admit: 2018-05-13 | Discharge: 2018-05-13 | Disposition: A | Discharge status: Home / Self Care | Payer: PPO | Source: Ambulatory Visit | Attending: Family Medicine | Admitting: Family Medicine

## 2018-05-13 ENCOUNTER — Encounter: Payer: Self-pay | Admitting: Family Medicine

## 2018-05-13 VITALS — BP 110/70 | HR 78 | Ht 63.0 in | Wt 204.0 lb

## 2018-05-13 DIAGNOSIS — G8929 Other chronic pain: Secondary | ICD-10-CM

## 2018-05-13 DIAGNOSIS — M545 Low back pain: Secondary | ICD-10-CM | POA: Diagnosis not present

## 2018-05-13 DIAGNOSIS — M25561 Pain in right knee: Secondary | ICD-10-CM | POA: Diagnosis not present

## 2018-05-13 DIAGNOSIS — M549 Dorsalgia, unspecified: Secondary | ICD-10-CM

## 2018-05-13 DIAGNOSIS — M5442 Lumbago with sciatica, left side: Secondary | ICD-10-CM | POA: Diagnosis not present

## 2018-05-13 DIAGNOSIS — M5441 Lumbago with sciatica, right side: Secondary | ICD-10-CM | POA: Diagnosis not present

## 2018-05-13 DIAGNOSIS — M170 Bilateral primary osteoarthritis of knee: Secondary | ICD-10-CM | POA: Diagnosis not present

## 2018-05-13 DIAGNOSIS — M2141 Flat foot [pes planus] (acquired), right foot: Secondary | ICD-10-CM

## 2018-05-13 DIAGNOSIS — M2142 Flat foot [pes planus] (acquired), left foot: Secondary | ICD-10-CM

## 2018-05-13 DIAGNOSIS — M2140 Flat foot [pes planus] (acquired), unspecified foot: Secondary | ICD-10-CM | POA: Insufficient documentation

## 2018-05-13 NOTE — Patient Instructions (Signed)
Good to see you  Xrays downstairs Gabapentin 200mg  at night- stop trazadone for 3 days but then if still not sleeping I would start  pennsaid pinkie amount topically 2 times daily as needed.  Over hte counter get  Turmeric 500mg  daily  Tart cherry extract any dose at night  Spenco orthotics "total support" online would be great  Avoid being barefoot Exercises 3 times a week.  See me again in 4-6 weeks to make sure you are doing better and do not need injections

## 2018-05-13 NOTE — Assessment & Plan Note (Signed)
Standing x-rays ordered today.  My guess will be moderate to severe.  Discussed the possibility of injections but patient will try topical anti-inflammatories first and icing regimen.  Patient may need injections and will consider that at follow-up in 4 to 6 weeks.  Discussed that the alignment of the knees could also be contributing secondary to patient's pes planus.

## 2018-05-13 NOTE — Assessment & Plan Note (Signed)
Discussed over-the-counter orthotics.

## 2018-05-13 NOTE — Assessment & Plan Note (Signed)
I am concerned for significant degenerative disc disease and I am concerned for the possibility patient started on gabapentin.  We will do a low dose secondary to patient's chronic kidney disease.  Hoping that this will be beneficial and patient may not need the trazodone for the nighttime relief anymore.  We discussed icing regimen, home exercise and patient work with Product/process development scientist.  X-rays ordered today to further evaluate.  May need advanced imaging patient could be a candidate for epidurals if needed.  Follow-up again in 4 to 6 weeks

## 2018-05-14 ENCOUNTER — Telehealth: Payer: Self-pay | Admitting: Physician Assistant

## 2018-05-14 MED ORDER — GABAPENTIN 100 MG PO CAPS: 200.0000 mg | ORAL_CAPSULE | Freq: Every day | ORAL | 3 refills | Status: DC

## 2018-05-14 MED ORDER — DICLOFENAC SODIUM 2 % TD SOLN: TRANSDERMAL | 3 refills | Status: DC

## 2018-05-14 NOTE — Telephone Encounter (Signed)
Gabapentin & pennsaid sent into pharmacy.  Pt made aware.

## 2018-05-14 NOTE — Telephone Encounter (Signed)
Copied from [place] [id number]. Topic: Quick Communication - See Telephone Encounter >> May 14, 2018  2:49 PM Bea Graff, NT wrote: CRM for notification. See Telephone encounter for: 05/14/18. Pt states that the pennsaid and gabapentin was not sent in for her yesterday to the pharmacy by Dr. Tamala Julian and she is checking status on this medication being sent in? Please advise.

## 2018-05-30 ENCOUNTER — Other Ambulatory Visit: Payer: Self-pay | Admitting: Physician Assistant

## 2018-05-30 DIAGNOSIS — F5101 Primary insomnia: Secondary | ICD-10-CM

## 2018-05-30 DIAGNOSIS — E038 Other specified hypothyroidism: Secondary | ICD-10-CM

## 2018-06-15 ENCOUNTER — Other Ambulatory Visit: Payer: Self-pay | Admitting: Physician Assistant

## 2018-06-15 DIAGNOSIS — E119 Type 2 diabetes mellitus without complications: Secondary | ICD-10-CM

## 2018-06-16 ENCOUNTER — Encounter: Payer: Self-pay | Admitting: *Deleted

## 2018-06-17 ENCOUNTER — Ambulatory Visit: Payer: PPO | Admitting: Family Medicine

## 2018-08-03 ENCOUNTER — Other Ambulatory Visit: Payer: Self-pay | Admitting: Physician Assistant

## 2018-08-03 DIAGNOSIS — K219 Gastro-esophageal reflux disease without esophagitis: Secondary | ICD-10-CM

## 2018-08-24 ENCOUNTER — Other Ambulatory Visit: Payer: Self-pay | Admitting: Physician Assistant

## 2018-08-24 DIAGNOSIS — R609 Edema, unspecified: Secondary | ICD-10-CM

## 2018-08-26 ENCOUNTER — Other Ambulatory Visit: Payer: Self-pay | Admitting: Physician Assistant

## 2018-08-26 DIAGNOSIS — E119 Type 2 diabetes mellitus without complications: Secondary | ICD-10-CM

## 2018-08-26 NOTE — Telephone Encounter (Signed)
Please review

## 2018-10-28 ENCOUNTER — Other Ambulatory Visit: Payer: Self-pay | Admitting: Physician Assistant

## 2018-10-28 DIAGNOSIS — E559 Vitamin D deficiency, unspecified: Secondary | ICD-10-CM

## 2018-11-01 ENCOUNTER — Other Ambulatory Visit: Payer: Self-pay | Admitting: Family Medicine

## 2018-11-25 ENCOUNTER — Other Ambulatory Visit: Payer: Self-pay | Admitting: Physician Assistant

## 2018-11-25 DIAGNOSIS — E119 Type 2 diabetes mellitus without complications: Secondary | ICD-10-CM

## 2018-11-28 ENCOUNTER — Other Ambulatory Visit: Payer: Self-pay | Admitting: Physician Assistant

## 2018-11-28 DIAGNOSIS — E038 Other specified hypothyroidism: Secondary | ICD-10-CM

## 2019-01-01 NOTE — Progress Notes (Signed)
Patient: Donna Malone Female    DOB: 09/26/1945   73 y.o.   MRN: [id number] Visit Date: 01/02/2019  Today's Provider: Mar Daring, PA-C   Chief Complaint  Patient presents with  . Diarrhea   Subjective:     Virtual Visit via Telephone Note  I connected with Donna Malone on 01/02/19 at  8:20 AM EDT by telephone and verified that I am speaking with the correct person using two identifiers.  Location: Patient: Home Provider: [facility]   I discussed the limitations, risks, security and privacy concerns of performing an evaluation and management service by telephone and the availability of in person appointments. I also discussed with the patient that there may be a patient responsible charge related to this service. The patient expressed understanding and agreed to proceed.   Diarrhea  This is a chronic problem. The problem has been unchanged. The stool consistency is described as watery (loose). The patient states that diarrhea does not awaken her from sleep. Associated symptoms include bloating. Pertinent negatives include no fever. Associated symptoms comments: No energy and strenght. Nothing aggravates the symptoms.    Her main concern is that she is not sleeping well, the most she gets of sleep is 4 hours. She reports she has been under more stress recently with her husband's declining health. He is currently hospitalized. She reports he has been having hiccups and that has effected her sleep. She has tried trazodone and melatonin without relief. She also feels that her metformin may be causing it since she has chronic diarrhea from the metformin. She reports after any activity she has to lie down and rest. She does have h/o hypothyroidism, vit d def and CKD stage 3.   Allergies  Allergen Reactions  . Sulfa Antibiotics      Current Outpatient Medications:  .  Acetaminophen (EQ ARTHRITIS PAIN PO), Take 1 tablet by mouth 2 (two) times daily., Disp: , Rfl:  .   allopurinol (ZYLOPRIM) 100 MG tablet, TAKE 1 TABLET (100 MG TOTAL) BY MOUTH DAILY., Disp: 90 tablet, Rfl: 1 .  atorvastatin (LIPITOR) 40 MG tablet, TAKE 1 TABLET BY MOUTH EVERY DAY, Disp: 90 tablet, Rfl: 3 .  B Complex Vitamins (GNP VITAMIN B COMPLEX PO), Take by mouth daily. , Disp: , Rfl:  .  colchicine 0.6 MG tablet, TAKE 1 TABLET BY MOUTH TWICE A DAY (Patient taking differently: TAKE 1 TABLET BY MOUTH TWICE A DAY as needed), Disp: 60 tablet, Rfl: 5 .  escitalopram (LEXAPRO) 20 MG tablet, TAKE 1 TABLET BY MOUTH EVERY DAY, Disp: 90 tablet, Rfl: 3 .  ferrous sulfate 325 (65 FE) MG EC tablet, Take 325 mg by mouth daily., Disp: , Rfl:  .  furosemide (LASIX) 40 MG tablet, TAKE 1 TABLET (40 MG TOTAL) BY MOUTH DAILY., Disp: 90 tablet, Rfl: 1 .  gabapentin (NEURONTIN) 100 MG capsule, TAKE 2 CAPSULES (200 MG TOTAL) BY MOUTH AT BEDTIME., Disp: 180 capsule, Rfl: 1 .  glipiZIDE (GLUCOTROL XL) 5 MG 24 hr tablet, TAKE 1 TABLET BY MOUTH EVERY DAY WITH BREAKFAST, Disp: 90 tablet, Rfl: 3 .  glucose blood test strip, OneTouch Ultra Blue Test Strip, Disp: , Rfl:  .  levothyroxine (SYNTHROID) 75 MCG tablet, TAKE 1 TABLET BY MOUTH EVERY DAY, Disp: 90 tablet, Rfl: 1 .  magnesium 30 MG tablet, Take 30 mg by mouth daily. Unsure of dose, Disp: , Rfl:  .  metFORMIN (GLUCOPHAGE) 1000 MG tablet, TAKE 1 TABLET (  1,000 MG TOTAL) BY MOUTH 2 (TWO) TIMES DAILY WITH A MEAL., Disp: 180 tablet, Rfl: 0 .  ONE TOUCH ULTRA TEST test strip, TEST BLOOD SUGAR ONCE DAILY, Disp: 100 each, Rfl: 4 .  Vitamin D, Ergocalciferol, (DRISDOL) 1.25 MG (50000 UT) CAPS capsule, TAKE 1 CAPSULE BY MOUTH ONE TIME PER WEEK, Disp: 12 capsule, Rfl: 1 .  Diclofenac Sodium 2 % SOLN, Apply 1 pump twice daily as needed. (Patient not taking: Reported on 01/02/2019), Disp: 112 g, Rfl: 3 .  famotidine (PEPCID) 40 MG tablet, Take 1 tablet (40 mg total) by mouth daily. (Patient not taking: Reported on 05/13/2018), Disp: 90 tablet, Rfl: 1 .  meloxicam (MOBIC) 15 MG  tablet, TAKE 1 TABLET BY MOUTH EVERY DAY WITH MEALS, Disp: , Rfl: 1 .  potassium chloride (MICRO-K) 10 MEQ CR capsule, TAKE 1 CAPSULE BY MOUTH DAILY (Patient not taking: Reported on 05/13/2018), Disp: 90 capsule, Rfl: 3 .  traMADol (ULTRAM) 50 MG tablet, TAKE ONE TABLET EVERY SIX HOURS AS NEEDED FOR PAIN, Disp: , Rfl: 0 .  traZODone (DESYREL) 100 MG tablet, TAKE 1 TABLET BY MOUTH AT BEDTIME (Patient not taking: Reported on 01/02/2019), Disp: 90 tablet, Rfl: 1  Review of Systems  Constitutional: Positive for fatigue. Negative for fever.  HENT: Negative.   Respiratory: Negative.   Cardiovascular: Negative.   Gastrointestinal: Positive for bloating and diarrhea.  Neurological: Negative.   Psychiatric/Behavioral: Positive for sleep disturbance.    Social History   Tobacco Use  . Smoking status: Never Smoker  . Smokeless tobacco: Never Used  Substance Use Topics  . Alcohol use: No      Objective:   Wt 194 lb (88 kg)   BMI 34.37 kg/m  Vitals:   01/02/19 0805  Weight: 194 lb (88 kg)  Body mass index is 34.37 kg/m.   Physical Exam Vitals signs reviewed.  Constitutional:      General: She is not in acute distress. Pulmonary:     Effort: Pulmonary effort is normal. No respiratory distress.  Neurological:     Mental Status: She is alert.      No results found for any visits on 01/02/19.     Assessment & Plan     1. Essential hypertension Unsure of source of fatigue as it may be multifactorial. She has multiple chronic health issues that can contribute as well as the stress and worry over her husband. Will check labs as below. If normal, will consider changing metformin at patient request, trying a different sleep aid, and may consider changing lexapro. I will f/u pending results. Call if worsening. - CBC w/Diff/Platelet - Comprehensive Metabolic Panel (CMET) - HgB A1c  2. Acquired hypothyroidism See above medical treatment plan. - CBC w/Diff/Platelet - Comprehensive  Metabolic Panel (CMET) - TSH  3. Type 2 diabetes mellitus with stage 3b chronic kidney disease, without long-term current use of insulin ([place]) See above medical treatment plan. - CBC w/Diff/Platelet - Comprehensive Metabolic Panel (CMET) - HgB A1c  4. Fatigue, unspecified type See above medical treatment plan. - CBC w/Diff/Platelet - Comprehensive Metabolic Panel (CMET) - TSH - HgB A1c - B12 - Vitamin D (25 hydroxy)  5. Vitamin D deficiency See above medical treatment plan. - Vitamin D (25 hydroxy)   I discussed the assessment and treatment plan with the patient. The patient was provided an opportunity to ask questions and all were answered. The patient agreed with the plan and demonstrated an understanding of the instructions.   The patient  was advised to call back or seek an in-person evaluation if the symptoms worsen or if the condition fails to improve as anticipated.  I provided 20 minutes of non-face-to-face time during this encounter.    Mar Daring, PA-C  [facility]

## 2019-01-02 ENCOUNTER — Encounter: Payer: Self-pay | Admitting: Physician Assistant

## 2019-01-02 ENCOUNTER — Ambulatory Visit (INDEPENDENT_AMBULATORY_CARE_PROVIDER_SITE_OTHER): Payer: PPO | Admitting: Physician Assistant

## 2019-01-02 VITALS — Wt 194.0 lb

## 2019-01-02 DIAGNOSIS — R5383 Other fatigue: Secondary | ICD-10-CM | POA: Diagnosis not present

## 2019-01-02 DIAGNOSIS — E039 Hypothyroidism, unspecified: Secondary | ICD-10-CM | POA: Diagnosis not present

## 2019-01-02 DIAGNOSIS — N1832 Chronic kidney disease, stage 3b: Secondary | ICD-10-CM

## 2019-01-02 DIAGNOSIS — I10 Essential (primary) hypertension: Secondary | ICD-10-CM | POA: Diagnosis not present

## 2019-01-02 DIAGNOSIS — E559 Vitamin D deficiency, unspecified: Secondary | ICD-10-CM

## 2019-01-02 DIAGNOSIS — E1121 Type 2 diabetes mellitus with diabetic nephropathy: Secondary | ICD-10-CM | POA: Diagnosis not present

## 2019-01-02 DIAGNOSIS — E1122 Type 2 diabetes mellitus with diabetic chronic kidney disease: Secondary | ICD-10-CM

## 2019-01-03 LAB — COMPREHENSIVE METABOLIC PANEL
ALT: 34 IU/L — ABNORMAL HIGH (ref 0–32)
AST: 45 IU/L — ABNORMAL HIGH (ref 0–40)
Albumin/Globulin Ratio: 1.5 (ref 1.2–2.2)
Albumin: 4 g/dL (ref 3.7–4.7)
Alkaline Phosphatase: 71 IU/L (ref 39–117)
BUN/Creatinine Ratio: 16 (ref 12–28)
BUN: 26 mg/dL (ref 8–27)
Bilirubin Total: 0.8 mg/dL (ref 0.0–1.2)
CO2: 19 mmol/L — ABNORMAL LOW (ref 20–29)
Calcium: 10.3 mg/dL (ref 8.7–10.3)
Chloride: 99 mmol/L (ref 96–106)
Creatinine, Ser: 1.65 mg/dL — ABNORMAL HIGH (ref 0.57–1.00)
GFR calc Af Amer: 35 mL/min/{1.73_m2} — ABNORMAL LOW (ref >59)
GFR calc non Af Amer: 30 mL/min/{1.73_m2} — ABNORMAL LOW (ref >59)
Globulin, Total: 2.7 g/dL (ref 1.5–4.5)
Glucose: 144 mg/dL — ABNORMAL HIGH (ref 65–99)
Potassium: 3.7 mmol/L (ref 3.5–5.2)
Sodium: 138 mmol/L (ref 134–144)
Total Protein: 6.7 g/dL (ref 6.0–8.5)

## 2019-01-03 LAB — CBC WITH DIFFERENTIAL/PLATELET
Basophils Absolute: 0.1 10*3/uL (ref 0.0–0.2)
Basos: 1 %
EOS (ABSOLUTE): 0.3 10*3/uL (ref 0.0–0.4)
Eos: 3 %
Hematocrit: 35.5 % (ref 34.0–46.6)
Hemoglobin: 12.6 g/dL (ref 11.1–15.9)
Immature Grans (Abs): 0 10*3/uL (ref 0.0–0.1)
Immature Granulocytes: 0 %
Lymphocytes Absolute: 3.4 10*3/uL — ABNORMAL HIGH (ref 0.7–3.1)
Lymphs: 35 %
MCH: 33.9 pg — ABNORMAL HIGH (ref 26.6–33.0)
MCHC: 35.5 g/dL (ref 31.5–35.7)
MCV: 95 fL (ref 79–97)
Monocytes Absolute: 0.9 10*3/uL (ref 0.1–0.9)
Monocytes: 9 %
Neutrophils Absolute: 5 10*3/uL (ref 1.4–7.0)
Neutrophils: 52 %
Platelets: 338 10*3/uL (ref 150–450)
RBC: 3.72 x10E6/uL — ABNORMAL LOW (ref 3.77–5.28)
RDW: 12 % (ref 11.7–15.4)
WBC: 9.7 10*3/uL (ref 3.4–10.8)

## 2019-01-03 LAB — TSH: TSH: 0.908 u[IU]/mL (ref 0.450–4.500)

## 2019-01-03 LAB — HEMOGLOBIN A1C
Est. average glucose Bld gHb Est-mCnc: 134 mg/dL
Hgb A1c MFr Bld: 6.3 % — ABNORMAL HIGH (ref 4.8–5.6)

## 2019-01-03 LAB — VITAMIN D 25 HYDROXY (VIT D DEFICIENCY, FRACTURES): Vit D, 25-Hydroxy: 75.2 ng/mL (ref 30.0–100.0)

## 2019-01-03 LAB — VITAMIN B12: Vitamin B-12: 574 pg/mL (ref 232–1245)

## 2019-01-05 ENCOUNTER — Telehealth: Payer: Self-pay

## 2019-01-05 MED ORDER — GLIPIZIDE ER 10 MG PO TB24: 10.0000 mg | ORAL_TABLET | Freq: Every day | ORAL | 1 refills | Status: DC

## 2019-01-05 NOTE — Telephone Encounter (Signed)
-----   Message from Mar Daring, [place] sent at 01/05/2019 11:08 AM EDT ----- Blood count is stable. Kidney function has declined just slightly from 1.45 to 1.65 (creatinine), GFR from 36 to 30. Liver enzymes also slightly increased again as well. Sodium, potassium and calcium are normal. Thyroid is normal. A1c is doing well at 6.3. Lets stop metformin as discussed and increase glipizide to 10mg  from 5mg . Then we can recheck in 2-3 months (A1c and BMP) to see if other changes need to be made.

## 2019-01-05 NOTE — Telephone Encounter (Signed)
Viewed by [name] on 01/05/2019 12:01 PM Called patient to see if she had any questions her husband answer and left message with him for patient to call me back if any questions regarding [name]'s message through my chart and if she did to send [place] a my chart message or to call us back.

## 2019-01-05 NOTE — Addendum Note (Signed)
Addended by: Mar Daring on: 01/05/2019 11:09 AM   Modules accepted: Orders

## 2019-02-01 ENCOUNTER — Other Ambulatory Visit: Payer: Self-pay | Admitting: Physician Assistant

## 2019-02-01 DIAGNOSIS — M10471 Other secondary gout, right ankle and foot: Secondary | ICD-10-CM

## 2019-03-21 ENCOUNTER — Other Ambulatory Visit: Payer: Self-pay | Admitting: Physician Assistant

## 2019-03-21 DIAGNOSIS — R609 Edema, unspecified: Secondary | ICD-10-CM

## 2019-03-24 ENCOUNTER — Other Ambulatory Visit: Payer: Self-pay | Admitting: Physician Assistant

## 2019-03-24 DIAGNOSIS — E119 Type 2 diabetes mellitus without complications: Secondary | ICD-10-CM

## 2019-04-16 NOTE — Progress Notes (Signed)
Subjective:   Donna Malone is a 74 y.o. female who presents for Medicare Annual (Subsequent) preventive examination.  Review of Systems:  N/A  Cardiac Risk Factors include: advanced age (>37men, >7 women);diabetes mellitus;dyslipidemia;hypertension;obesity (BMI >30kg/m2)     Objective:     Vitals: BP 124/64 (BP Location: Right Arm)   Pulse 83   Temp 97.9 F (36.6 C) (Tympanic)   Ht 5\' 3"  (1.6 m)   Wt 199 lb 12.8 oz (90.6 kg)   BMI 35.39 kg/m   Body mass index is 35.39 kg/m.  Advanced Directives 04/20/2019 04/14/2018 04/04/2017 03/20/2016 10/29/2015 02/09/2015 11/08/2014  Does Patient Have a Medical Advance Directive? Yes Yes No Yes;No No No Yes  Type of Academic librarian;Living will [place];Living will - - - - Press photographer  Does patient want to make changes to medical advance directive? - - - No - Patient declined - - -  Copy of [place] in Chart? No - copy requested No - copy requested - - - - -  Would patient like information on creating a medical advance directive? - No - Patient declined No - Patient declined - No - patient declined information - -    Tobacco Social History   Tobacco Use  Smoking Status Never Smoker  Smokeless Tobacco Never Used     Counseling given: Not Answered   Clinical Intake:  Pre-visit preparation completed: Yes  Pain : No/denies pain Pain Score: 0-No pain     Nutritional Status: BMI > 30  Obese Nutritional Risks: Nausea/ vomitting/ diarrhea(Diarrhea intermitten due to diet.) Diabetes: Yes  How often do you need to have someone help you when you read instructions, pamphlets, or other written materials from your doctor or pharmacy?: 1 - Never   Diabetes:  Is the patient diabetic?  Yes  If diabetic, was a CBG obtained today?  No  Did the patient bring in their glucometer from home?  No  How often do you monitor your CBG's? Once a day in AM.   Financial Strains and Diabetes Management:  Are you having any financial strains with the device, your supplies or your medication? No .  Does the patient want to be seen by Chronic Care Management for management of their diabetes?  No  Would the patient like to be referred to a Nutritionist or for Diabetic Management?  No   Diabetic Exams:  Diabetic Eye Exam: Completed 04/2018 per pt. Overdue for diabetic eye exam. Pt has been advised about the importance in completing this exam. Requested records to be sent to clinic from Dr Purvis Sheffield office.   Diabetic Foot Exam: Completed 05/23/16. Pt has been advised about the importance in completing this exam. Note made to follow up on this at today's apt.     Interpreter Needed?: No  Information entered by :: [facility], LPN  Past Medical History:  Diagnosis Date  . Asthma   . Bone spur    heel  . Depression   . Gout   . Hypertension    Past Surgical History:  Procedure Laterality Date  . ABDOMINAL HYSTERECTOMY  04/1970   partial  . CHOLECYSTECTOMY  late 1990's   Family History  Problem Relation Age of Onset  . CAD Mother   . Heart attack Mother   . Heart disease Father   . Diabetes Cousin    Social History   Socioeconomic History  . Marital status: Married  Spouse name: [name]  . Number of children: 3  . Years of education: Not on file  . Highest education level: 12th grade  Occupational History  . Occupation: retired  Tobacco Use  . Smoking status: Never Smoker  . Smokeless tobacco: Never Used  Substance and Sexual Activity  . Alcohol use: No  . Drug use: No  . Sexual activity: Not on file  Other Topics Concern  . Not on file  Social History Narrative  . Not on file   Social Determinants of Health   Financial Resource Strain: Low Risk   . Difficulty of Paying Living Expenses: Not hard at all  Food Insecurity: No Food Insecurity  . Worried About Charity fundraiser in the Last Year: Never true  . Ran Out of  Food in the Last Year: Never true  Transportation Needs: No Transportation Needs  . Lack of Transportation (Medical): No  . Lack of Transportation (Non-Medical): No  Physical Activity: Inactive  . Days of Exercise per Week: 0 days  . Minutes of Exercise per Session: 0 min  Stress: No Stress Concern Present  . Feeling of Stress : Only a little  Social Connections: Slightly Isolated  . Frequency of Communication with Friends and Family: More than three times a week  . Frequency of Social Gatherings with Friends and Family: Twice a week  . Attends Religious Services: More than 4 times per year  . Active Member of Clubs or Organizations: No  . Attends Archivist Meetings: Never  . Marital Status: Married    Outpatient Encounter Medications as of 04/20/2019  Medication Sig  . Acetaminophen (EQ ARTHRITIS PAIN PO) Take 1 tablet by mouth 2 (two) times daily. As needed  . allopurinol (ZYLOPRIM) 100 MG tablet TAKE 1 TABLET BY MOUTH EVERY DAY  . atorvastatin (LIPITOR) 40 MG tablet TAKE 1 TABLET BY MOUTH EVERY DAY  . B Complex Vitamins (GNP VITAMIN B COMPLEX PO) Take by mouth daily.   . colchicine 0.6 MG tablet TAKE 1 TABLET BY MOUTH TWICE A DAY (Patient taking differently: TAKE 1 TABLET BY MOUTH TWICE A DAY as needed)  . escitalopram (LEXAPRO) 20 MG tablet TAKE 1 TABLET BY MOUTH EVERY DAY  . ferrous sulfate 325 (65 FE) MG EC tablet Take 325 mg by mouth daily.  . furosemide (LASIX) 40 MG tablet TAKE 1 TABLET BY MOUTH EVERY DAY  . glipiZIDE (GLUCOTROL XL) 10 MG 24 hr tablet Take 1 tablet (10 mg total) by mouth daily with breakfast.  . glucose blood test strip OneTouch Ultra Blue Test Strip  . levothyroxine (SYNTHROID) 75 MCG tablet TAKE 1 TABLET BY MOUTH EVERY DAY  . magnesium 30 MG tablet Take 30 mg by mouth daily. Unsure of dose  . ONE TOUCH ULTRA TEST test strip TEST BLOOD SUGAR ONCE DAILY  . Vitamin D, Ergocalciferol, (DRISDOL) 1.25 MG (50000 UNIT) CAPS capsule TAKE 1 CAPSULE BY  MOUTH ONE TIME PER WEEK  . Diclofenac Sodium 2 % SOLN Apply 1 pump twice daily as needed. (Patient not taking: Reported on 01/02/2019)  . famotidine (PEPCID) 40 MG tablet Take 1 tablet (40 mg total) by mouth daily. (Patient not taking: Reported on 05/13/2018)  . gabapentin (NEURONTIN) 100 MG capsule TAKE 2 CAPSULES (200 MG TOTAL) BY MOUTH AT BEDTIME. (Patient not taking: Reported on 04/20/2019)  . meloxicam (MOBIC) 15 MG tablet TAKE 1 TABLET BY MOUTH EVERY DAY WITH MEALS  . potassium chloride (MICRO-K) 10 MEQ CR capsule TAKE 1 CAPSULE  BY MOUTH DAILY (Patient not taking: Reported on 05/13/2018)  . traMADol (ULTRAM) 50 MG tablet TAKE ONE TABLET EVERY SIX HOURS AS NEEDED FOR PAIN  . traZODone (DESYREL) 100 MG tablet TAKE 1 TABLET BY MOUTH AT BEDTIME (Patient not taking: Reported on 01/02/2019)  . [DISCONTINUED] allopurinol (ZYLOPRIM) 100 MG tablet TAKE 1 TABLET (100 MG TOTAL) BY MOUTH DAILY.  . [DISCONTINUED] atorvastatin (LIPITOR) 40 MG tablet TAKE 1 TABLET BY MOUTH EVERY DAY  . [DISCONTINUED] Vitamin D, Ergocalciferol, (DRISDOL) 1.25 MG (50000 UT) CAPS capsule TAKE 1 CAPSULE BY MOUTH ONE TIME PER WEEK   No facility-administered encounter medications on file as of 04/20/2019.    Activities of Daily Living In your present state of health, do you have any difficulty performing the following activities: 04/20/2019  Hearing? N  Vision? N  Difficulty concentrating or making decisions? Y  Walking or climbing stairs? Y  Comment Due to low energy.  Dressing or bathing? N  Doing errands, shopping? N  Preparing Food and eating ? N  Using the Toilet? N  In the past six months, have you accidently leaked urine? Y  Comment Occasionally with urges.  Do you have problems with loss of bowel control? N  Managing your Medications? N  Managing your Finances? N  Housekeeping or managing your Housekeeping? N  Some recent data might be hidden    Patient Care Team: Mar Daring, PA-C as PCP - General  (Family Medicine) Lorelee Cover., MD as Consulting Physician (Ophthalmology) Lovell Sheehan, MD as Consulting Physician (Orthopedic Surgery)    Assessment:   This is a routine wellness examination for Almee.  Exercise Activities and Dietary recommendations Current Exercise Habits: The patient does not participate in regular exercise at present, Exercise limited by: Other - see comments(no energy)  Goals    . Exercise 150 minutes per week (moderate activity)     Recommend to start back with water aerobics for 3 days a week for 55 minutes.     . Increase water intake     Starting 03/20/16, I will increase my water intake to 6 glasses a day.    . Peak Blood Glucose < 180       Fall Risk: Fall Risk  04/20/2019 04/14/2018 02/07/2018 04/09/2017 04/04/2017  Falls in the past year? 0 1 1 No No  Number falls in past yr: 0 0 0 - -  Injury with Fall? 0 0 0 - -  Follow up - Falls prevention discussed - - -    FALL RISK PREVENTION PERTAINING TO THE HOME:  Any stairs in or around the home? Yes  If so, are there any without handrails? No   Home free of loose throw rugs in walkways, pet beds, electrical cords, etc? Yes  Adequate lighting in your home to reduce risk of falls? Yes   ASSISTIVE DEVICES UTILIZED TO PREVENT FALLS:  Life alert? No  Use of a cane, walker or w/c? Yes  Grab bars in the bathroom? No  Shower chair or bench in shower? Yes  Elevated toilet seat or a handicapped toilet? No, but has an elevated commode it just needs to be installed.   TIMED UP AND GO:  Was the test performed? No .    Depression Screen PHQ 2/9 Scores 04/20/2019 04/14/2018 04/04/2017 11/20/2016  PHQ - 2 Score 4 2 1  0  PHQ- 9 Score 16 11 - 8     Cognitive Function: Declined today.      6CIT Screen  04/14/2018 03/20/2016  What Year? 0 points 0 points  What month? 0 points 0 points  What time? 0 points 0 points  Count back from 20 0 points 0 points  Months in reverse 0 points 0 points  Repeat  phrase 0 points 2 points  Total Score 0 2    Immunization History  Administered Date(s) Administered  . Influenza Split 12/16/2009, 03/21/2011, 01/30/2012  . Influenza, High Dose Seasonal PF 02/09/2015, 03/20/2016, 12/24/2016, 02/07/2018  . Influenza,inj,Quad PF,6+ Mos 02/06/2013, 12/03/2013  . Pneumococcal Conjugate-13 12/03/2013  . Pneumococcal Polysaccharide-23 03/21/2011  . Tdap 04/09/2008    Qualifies for Shingles Vaccine? Yes . Due for Shingrix. Pt has been advised to call insurance company to determine out of pocket expense. Advised may also receive vaccine at local pharmacy or Health Dept. Verbalized acceptance and understanding.  Tdap: Although this vaccine is not a covered service during a Wellness Exam, does the patient still wish to receive this vaccine today?  No . Advised may receive this vaccine at local pharmacy or Health Dept. Aware to provide a copy of the vaccination record if obtained from local pharmacy or Health Dept. Verbalized acceptance and understanding.  Flu Vaccine: Due for Flu vaccine. Does the patient want to receive this vaccine today?  No .   Pneumococcal Vaccine: Completed series  Screening Tests Health Maintenance  Topic Date Due  . OPHTHALMOLOGY EXAM  03/01/2017  . FOOT EXAM  05/23/2017  . URINE MICROALBUMIN  04/09/2018  . INFLUENZA VACCINE  11/01/2018  . TETANUS/TDAP  04/19/2020 (Originally 04/09/2018)  . MAMMOGRAM  04/23/2019  . HEMOGLOBIN A1C  07/03/2019  . Fecal DNA (Cologuard)  02/28/2021  . DEXA SCAN  Completed  . Hepatitis C Screening  Completed  . PNA vac Low Risk Adult  Completed    Cancer Screenings:  Colorectal Screening: Cologuard completed 02/28/18. Repeat every 3 years.   Mammogram: Completed 04/22/18. Repeat every 1-2 years as advised.   Bone Density: Completed 03/10/18. Previous DEXA scan was normal. No repeat needed unless advised by a physician.  Lung Cancer Screening: (Low Dose CT Chest recommended if Age 18-80 years, 30  pack-year currently smoking OR have quit w/in 15years.) does not qualify.   Additional Screening:  Hepatitis C Screening: Up to date  Dental Screening: Recommended annual dental exams for proper oral hygiene   Community Resource Referral:  CRR required this visit?  No       Plan:  I have personally reviewed and addressed the Medicare Annual Wellness questionnaire and have noted the following in the patient's chart:  A. Medical and social history B. Use of alcohol, tobacco or illicit drugs  C. Current medications and supplements D. Functional ability and status E.  Nutritional status F.  Physical activity G. Advance directives H. List of other physicians I.  Hospitalizations, surgeries, and ER visits in previous 12 months J.  [place] such as hearing and vision if needed, cognitive and depression L. Referrals and appointments   In addition, I have reviewed and discussed with patient certain preventive protocols, quality metrics, and best practice recommendations. A written personalized care plan for preventive services as well as general preventive health recommendations were provided to patient.   Glendora Score, [place]  11/12/7515 Nurse Health Advisor   Nurse Notes: Pt needs a diabetic foot exam, influenza vaccine and urine check today. Requested records from previous eye exam to update records.

## 2019-04-19 ENCOUNTER — Other Ambulatory Visit: Payer: Self-pay | Admitting: Physician Assistant

## 2019-04-19 DIAGNOSIS — E7800 Pure hypercholesterolemia, unspecified: Secondary | ICD-10-CM

## 2019-04-19 DIAGNOSIS — E559 Vitamin D deficiency, unspecified: Secondary | ICD-10-CM

## 2019-04-19 NOTE — Telephone Encounter (Signed)
Requested medication (s) are due for refill today: yes  Requested medication (s) are on the active medication list: yes  Last refill:  10/08/18 #12 with 1 refill  Future visit scheduled: yes  Notes to clinic: Refill not delegated per protocol.     Requested Prescriptions  Pending Prescriptions Disp Refills   Vitamin D, Ergocalciferol, (DRISDOL) 1.25 MG (50000 UNIT) CAPS capsule [Pharmacy Med Name: VITAMIN D2 1.25MG (50,000 UNIT)] 12 capsule 1    Sig: TAKE 1 CAPSULE BY MOUTH ONE TIME PER WEEK      Endocrinology:  Vitamins - Vitamin D Supplementation Failed - 04/19/2019 12:51 AM      Failed - 50,000 IU strengths are not delegated      Failed - Phosphate in normal range and within 360 days    Phosphorus  Date Value Ref Range Status  12/21/2016 3.1 2.5 - 4.5 mg/dL Final          Passed - Ca in normal range and within 360 days    Calcium  Date Value Ref Range Status  01/02/2019 10.3 8.7 - 10.3 mg/dL Final          Passed - Vitamin D in normal range and within 360 days    Vit D, 25-Hydroxy  Date Value Ref Range Status  01/02/2019 75.2 30.0 - 100.0 ng/mL Final    Comment:    Vitamin D deficiency has been defined by the Institute of Medicine and an Endocrine Society practice guideline as a level of serum 25-OH vitamin D less than 20 ng/mL (1,2). The Endocrine Society went on to further define vitamin D insufficiency as a level between 21 and 29 ng/mL (2). 1. IOM (Institute of Medicine). 2010. Dietary reference    intakes for calcium and D. [place]: The    [employer]. 2. Holick MF, Binkley [place], Bischoff-Ferrari HA, et al.    Evaluation, treatment, and prevention of vitamin D    deficiency: an Endocrine Society clinical practice    guideline. JCEM. 2011 Jul; 96(7):1911-30.           Passed - Valid encounter within last 12 months    Recent Outpatient Visits           3 months ago Essential hypertension   [facility] [name] M,  [place]   11 months ago Chronic back pain, unspecified back location, unspecified back pain laterality   [place], [place], DO   1 year ago Annual physical exam   [facility] [name] M, PA-C   1 year ago Type 2 diabetes mellitus without complication, without long-term current use of insulin [facility])   [place], Clearnce Sorrel, [place]   2 years ago Annual physical exam   [place], Clearnce Sorrel, PA-C       Future Appointments             Tomorrow  [facility], [place], Clearnce Sorrel, PA-C [employer]             Signed Prescriptions Disp Refills   atorvastatin (LIPITOR) 40 MG tablet 90 tablet 3    Sig: TAKE 1 TABLET BY MOUTH EVERY DAY      Cardiovascular:  Antilipid - Statins Failed - 04/19/2019 12:51 AM      Failed - Total Cholesterol in normal range and within 360 days    Cholesterol, Total  Date Value Ref Range Status  04/14/2018 310 (H) 100 - 199 mg/dL Final          Failed - LDL in normal range and within 360 days    LDL Calculated  Date Value Ref Range Status  04/14/2018 Comment 0 - 99 mg/dL Final    Comment:    Triglyceride result indicated is too high for an accurate LDL cholesterol estimation.           Failed - HDL in normal range and within 360 days    HDL  Date Value Ref Range Status  04/14/2018 37 (L) >39 mg/dL Final          Failed - Triglycerides in normal range and within 360 days    Triglycerides  Date Value Ref Range Status  04/14/2018 587 (HH) 0 - 149 mg/dL Final          Passed - Patient is not pregnant      Passed - Valid encounter within last 12 months    Recent Outpatient Visits           3 months ago Essential hypertension   [facility] [name] M, [place]   11 months ago Chronic back pain, unspecified back location, unspecified back pain laterality    [place], [place], DO   1 year ago Annual physical exam   [facility] [name] M, PA-C   1 year ago Type 2 diabetes mellitus without complication, without long-term current use of insulin [facility])   [facility] [place], Clearnce Sorrel, [place]   2 years ago Annual physical exam   [employer], Clearnce Sorrel, PA-C       Future Appointments             [place], [place], Clearnce Sorrel, PA-C [employer], [facility]

## 2019-04-20 ENCOUNTER — Ambulatory Visit (INDEPENDENT_AMBULATORY_CARE_PROVIDER_SITE_OTHER): Payer: PPO | Admitting: Physician Assistant

## 2019-04-20 ENCOUNTER — Other Ambulatory Visit: Payer: Self-pay

## 2019-04-20 ENCOUNTER — Ambulatory Visit (INDEPENDENT_AMBULATORY_CARE_PROVIDER_SITE_OTHER): Payer: PPO

## 2019-04-20 ENCOUNTER — Encounter: Payer: Self-pay | Admitting: Physician Assistant

## 2019-04-20 VITALS — BP 126/64 | HR 83 | Temp 97.9°F | Ht 63.0 in | Wt 199.0 lb

## 2019-04-20 VITALS — BP 124/64 | HR 83 | Temp 97.9°F | Ht 63.0 in | Wt 199.8 lb

## 2019-04-20 DIAGNOSIS — I10 Essential (primary) hypertension: Secondary | ICD-10-CM | POA: Diagnosis not present

## 2019-04-20 DIAGNOSIS — E034 Atrophy of thyroid (acquired): Secondary | ICD-10-CM

## 2019-04-20 DIAGNOSIS — Z23 Encounter for immunization: Secondary | ICD-10-CM | POA: Diagnosis not present

## 2019-04-20 DIAGNOSIS — Z6835 Body mass index (BMI) 35.0-35.9, adult: Secondary | ICD-10-CM

## 2019-04-20 DIAGNOSIS — E1121 Type 2 diabetes mellitus with diabetic nephropathy: Secondary | ICD-10-CM | POA: Diagnosis not present

## 2019-04-20 DIAGNOSIS — F330 Major depressive disorder, recurrent, mild: Secondary | ICD-10-CM

## 2019-04-20 DIAGNOSIS — K219 Gastro-esophageal reflux disease without esophagitis: Secondary | ICD-10-CM | POA: Diagnosis not present

## 2019-04-20 DIAGNOSIS — F5101 Primary insomnia: Secondary | ICD-10-CM

## 2019-04-20 DIAGNOSIS — E7800 Pure hypercholesterolemia, unspecified: Secondary | ICD-10-CM

## 2019-04-20 DIAGNOSIS — Z0000 Encounter for general adult medical examination without abnormal findings: Secondary | ICD-10-CM

## 2019-04-20 DIAGNOSIS — N1832 Chronic kidney disease, stage 3b: Secondary | ICD-10-CM | POA: Diagnosis not present

## 2019-04-20 DIAGNOSIS — E559 Vitamin D deficiency, unspecified: Secondary | ICD-10-CM

## 2019-04-20 LAB — POCT UA - MICROALBUMIN: Microalbumin Ur, POC: 50 mg/L

## 2019-04-20 LAB — POCT GLYCOSYLATED HEMOGLOBIN (HGB A1C): Hemoglobin A1C: 6.4 % — AB (ref 4.0–5.6)

## 2019-04-20 MED ORDER — ONETOUCH VERIO W/DEVICE KIT: PACK | 0 refills | Status: DC

## 2019-04-20 MED ORDER — FAMOTIDINE 40 MG PO TABS: 40.0000 mg | ORAL_TABLET | Freq: Every day | ORAL | 1 refills | Status: DC

## 2019-04-20 MED ORDER — ONETOUCH VERIO VI STRP: ORAL_STRIP | 12 refills | Status: DC

## 2019-04-20 MED ORDER — ZALEPLON 5 MG PO CAPS: 5.0000 mg | ORAL_CAPSULE | Freq: Every evening | ORAL | 1 refills | Status: DC | PRN

## 2019-04-20 MED ORDER — ONETOUCH ULTRASOFT LANCETS MISC: 12 refills | Status: DC

## 2019-04-20 NOTE — Patient Instructions (Signed)
Health Maintenance After Age 76 After age 76, you are at a higher risk for certain long-term diseases and infections as well as injuries from falls. Falls are a major cause of broken bones and head injuries in people who are older than age 76. Getting regular preventive care can help to keep you healthy and well. Preventive care includes getting regular testing and making lifestyle changes as recommended by your health care provider. Talk with your health care provider about:  Which screenings and tests you should have. A screening is a test that checks for a disease when you have no symptoms.  A diet and exercise plan that is right for you. What should I know about screenings and tests to prevent falls? Screening and testing are the best ways to find a health problem early. Early diagnosis and treatment give you the best chance of managing medical conditions that are common after age 76. Certain conditions and lifestyle choices may make you more likely to have a fall. Your health care provider may recommend:  Regular vision checks. Poor vision and conditions such as cataracts can make you more likely to have a fall. If you wear glasses, make sure to get your prescription updated if your vision changes.  Medicine review. Work with your health care provider to regularly review all of the medicines you are taking, including over-the-counter medicines. Ask your health care provider about any side effects that may make you more likely to have a fall. Tell your health care provider if any medicines that you take make you feel dizzy or sleepy.  Osteoporosis screening. Osteoporosis is a condition that causes the bones to get weaker. This can make the bones weak and cause them to break more easily.  Blood pressure screening. Blood pressure changes and medicines to control blood pressure can make you feel dizzy.  Strength and balance checks. Your health care provider may recommend certain tests to check your  strength and balance while standing, walking, or changing positions.  Foot health exam. Foot pain and numbness, as well as not wearing proper footwear, can make you more likely to have a fall.  Depression screening. You may be more likely to have a fall if you have a fear of falling, feel emotionally low, or feel unable to do activities that you used to do.  Alcohol use screening. Using too much alcohol can affect your balance and may make you more likely to have a fall. What actions can I take to lower my risk of falls? General instructions  Talk with your health care provider about your risks for falling. Tell your health care provider if: ? You fall. Be sure to tell your health care provider about all falls, even ones that seem minor. ? You feel dizzy, sleepy, or off-balance.  Take over-the-counter and prescription medicines only as told by your health care provider. These include any supplements.  Eat a healthy diet and maintain a healthy weight. A healthy diet includes low-fat dairy products, low-fat (lean) meats, and fiber from whole grains, beans, and lots of fruits and vegetables. Home safety  Remove any tripping hazards, such as rugs, cords, and clutter.  Install safety equipment such as grab bars in bathrooms and safety rails on stairs.  Keep rooms and walkways well-lit. Activity   Follow a regular exercise program to stay fit. This will help you maintain your balance. Ask your health care provider what types of exercise are appropriate for you.  If you need a cane or   walker, use it as recommended by your health care provider.  Wear supportive shoes that have nonskid soles. Lifestyle  Do not drink alcohol if your health care provider tells you not to drink.  If you drink alcohol, limit how much you have: ? 0-1 drink a day for women. ? 0-2 drinks a day for men.  Be aware of how much alcohol is in your drink. In the [place] one drink equals one typical bottle of beer (12  oz), one-half glass of wine (5 oz), or one shot of hard liquor (1 oz).  Do not use any products that contain nicotine or tobacco, such as cigarettes and e-cigarettes. If you need help quitting, ask your health care provider. Summary  Having a healthy lifestyle and getting preventive care can help to protect your health and wellness after age 76.  Screening and testing are the best way to find a health problem early and help you avoid having a fall. Early diagnosis and treatment give you the best chance for managing medical conditions that are more common for people who are older than age 76.  Falls are a major cause of broken bones and head injuries in people who are older than age 76. Take precautions to prevent a fall at home.  Work with your health care provider to learn what changes you can make to improve your health and wellness and to prevent falls. This information is not intended to replace advice given to you by your health care provider. Make sure you discuss any questions you have with your health care provider. Document Revised: 07/10/2018 Document Reviewed: 01/30/2017 Elsevier Patient Education  2020 [employer].  

## 2019-04-20 NOTE — Patient Instructions (Signed)
Donna Malone , Thank you for taking time to come for your Medicare Wellness Visit. I appreciate your ongoing commitment to your health goals. Please review the following plan we discussed and let me know if I can assist you in the future.   Screening recommendations/referrals: Colonoscopy: Cologuard up to date, due 02/28/21 Mammogram:Up to date, due 04/2020 Bone Density: Up to date. Previous DEXA scan was normal. No repeat needed unless advised by a physician. Recommended yearly ophthalmology/optometry visit for glaucoma screening and checkup Recommended yearly dental visit for hygiene and checkup  Vaccinations: Influenza vaccine: Currently due, pt to receive with PCP. Pneumococcal vaccine: Completed series Tdap vaccine: Pt declines today.  Shingles vaccine: Pt declines today.     Advanced directives: Please bring a copy of your POA (Power of Attorney) and/or Living Will to your next appointment.   Conditions/risks identified: Continue to increase water intake to 6-8 8 oz glasses a day.   Next appointment: 10:00 AM today with [name].   Preventive Care 10 Years and Older, Female Preventive care refers to lifestyle choices and visits with your health care provider that can promote health and wellness. What does preventive care include?  A yearly physical exam. This is also called an annual well check.  Dental exams once or twice a year.  Routine eye exams. Ask your health care provider how often you should have your eyes checked.  Personal lifestyle choices, including:  Daily care of your teeth and gums.  Regular physical activity.  Eating a healthy diet.  Avoiding tobacco and drug use.  Limiting alcohol use.  Practicing safe sex.  Taking low-dose aspirin every day.  Taking vitamin and mineral supplements as recommended by your health care provider. What happens during an annual well check? The services and screenings done by your health care provider during  your annual well check will depend on your age, overall health, lifestyle risk factors, and family history of disease. Counseling  Your health care provider may ask you questions about your:  Alcohol use.  Tobacco use.  Drug use.  Emotional well-being.  Home and relationship well-being.  Sexual activity.  Eating habits.  History of falls.  Memory and ability to understand (cognition).  Work and work Statistician.  Reproductive health. Screening  You may have the following tests or measurements:  Height, weight, and BMI.  Blood pressure.  Lipid and cholesterol levels. These may be checked every 5 years, or more frequently if you are over 71 years old.  Skin check.  Lung cancer screening. You may have this screening every year starting at age 65 if you have a 30-pack-year history of smoking and currently smoke or have quit within the past 15 years.  Fecal occult blood test (FOBT) of the stool. You may have this test every year starting at age 52.  Flexible sigmoidoscopy or colonoscopy. You may have a sigmoidoscopy every 5 years or a colonoscopy every 10 years starting at age 90.  Hepatitis C blood test.  Hepatitis B blood test.  Sexually transmitted disease (STD) testing.  Diabetes screening. This is done by checking your blood sugar (glucose) after you have not eaten for a while (fasting). You may have this done every 1-3 years.  Bone density scan. This is done to screen for osteoporosis. You may have this done starting at age 52.  Mammogram. This may be done every 1-2 years. Talk to your health care provider about how often you should have regular mammograms. Talk with your health care  provider about your test results, treatment options, and if necessary, the need for more tests. Vaccines  Your health care provider may recommend certain vaccines, such as:  Influenza vaccine. This is recommended every year.  Tetanus, diphtheria, and acellular pertussis (Tdap,  Td) vaccine. You may need a Td booster every 10 years.  Zoster vaccine. You may need this after age 45.  Pneumococcal 13-valent conjugate (PCV13) vaccine. One dose is recommended after age 57.  Pneumococcal polysaccharide (PPSV23) vaccine. One dose is recommended after age 35. Talk to your health care provider about which screenings and vaccines you need and how often you need them. This information is not intended to replace advice given to you by your health care provider. Make sure you discuss any questions you have with your health care provider. Document Released: 04/15/2015 Document Revised: 12/07/2015 Document Reviewed: 01/18/2015 Elsevier Interactive Patient Education  2017 [place] Prevention in the Home Falls can cause injuries. They can happen to people of all ages. There are many things you can do to make your home safe and to help prevent falls. What can I do on the outside of my home?  Regularly fix the edges of walkways and driveways and fix any cracks.  Remove anything that might make you trip as you walk through a door, such as a raised step or threshold.  Trim any bushes or trees on the path to your home.  Use bright outdoor lighting.  Clear any walking paths of anything that might make someone trip, such as rocks or tools.  Regularly check to see if handrails are loose or broken. Make sure that both sides of any steps have handrails.  Any raised decks and porches should have guardrails on the edges.  Have any leaves, snow, or ice cleared regularly.  Use sand or salt on walking paths during winter.  Clean up any spills in your garage right away. This includes oil or grease spills. What can I do in the bathroom?  Use night lights.  Install grab bars by the toilet and in the tub and shower. Do not use towel bars as grab bars.  Use non-skid mats or decals in the tub or shower.  If you need to sit down in the shower, use a plastic, non-slip  stool.  Keep the floor dry. Clean up any water that spills on the floor as soon as it happens.  Remove soap buildup in the tub or shower regularly.  Attach bath mats securely with double-sided non-slip rug tape.  Do not have throw rugs and other things on the floor that can make you trip. What can I do in the bedroom?  Use night lights.  Make sure that you have a light by your bed that is easy to reach.  Do not use any sheets or blankets that are too big for your bed. They should not hang down onto the floor.  Have a firm chair that has side arms. You can use this for support while you get dressed.  Do not have throw rugs and other things on the floor that can make you trip. What can I do in the kitchen?  Clean up any spills right away.  Avoid walking on wet floors.  Keep items that you use a lot in easy-to-reach places.  If you need to reach something above you, use a strong step stool that has a grab bar.  Keep electrical cords out of the way.  Do not use floor polish  or wax that makes floors slippery. If you must use wax, use non-skid floor wax.  Do not have throw rugs and other things on the floor that can make you trip. What can I do with my stairs?  Do not leave any items on the stairs.  Make sure that there are handrails on both sides of the stairs and use them. Fix handrails that are broken or loose. Make sure that handrails are as long as the stairways.  Check any carpeting to make sure that it is firmly attached to the stairs. Fix any carpet that is loose or worn.  Avoid having throw rugs at the top or bottom of the stairs. If you do have throw rugs, attach them to the floor with carpet tape.  Make sure that you have a light switch at the top of the stairs and the bottom of the stairs. If you do not have them, ask someone to add them for you. What else can I do to help prevent falls?  Wear shoes that:  Do not have high heels.  Have rubber bottoms.  Are  comfortable and fit you well.  Are closed at the toe. Do not wear sandals.  If you use a stepladder:  Make sure that it is fully opened. Do not climb a closed stepladder.  Make sure that both sides of the stepladder are locked into place.  Ask someone to hold it for you, if possible.  Clearly mark and make sure that you can see:  Any grab bars or handrails.  First and last steps.  Where the edge of each step is.  Use tools that help you move around (mobility aids) if they are needed. These include:  Canes.  Walkers.  Scooters.  Crutches.  Turn on the lights when you go into a dark area. Replace any light bulbs as soon as they burn out.  Set up your furniture so you have a clear path. Avoid moving your furniture around.  If any of your floors are uneven, fix them.  If there are any pets around you, be aware of where they are.  Review your medicines with your doctor. Some medicines can make you feel dizzy. This can increase your chance of falling. Ask your doctor what other things that you can do to help prevent falls. This information is not intended to replace advice given to you by your health care provider. Make sure you discuss any questions you have with your health care provider. Document Released: 01/13/2009 Document Revised: 08/25/2015 Document Reviewed: 04/23/2014 Elsevier Interactive Patient Education  2017 [employer].

## 2019-04-20 NOTE — Progress Notes (Signed)
Patient: Donna Malone, Female    DOB: 1946-02-21, 74 y.o.   MRN: [id number] Visit Date: 04/20/2019  Today's Provider: Mar Daring, PA-C   Chief Complaint  Patient presents with  . Annual Exam   Subjective:     Complete Physical Donna Malone is a 74 y.o. female. She feels fairly well. She reports not exercising. She reports she is sleeping poorly. ----------------------------------------------------------- Would like a medicine to help her sleep. Pt reports trazodone no longer works.  Need RX for Reflux.  Pt used to take Zantac.   Review of Systems  Constitutional: Positive for diaphoresis and fatigue. Negative for activity change, appetite change, chills, fever and unexpected weight change.  HENT: Negative.   Eyes: Positive for photophobia and itching. Negative for pain, discharge, redness and visual disturbance.  Respiratory: Positive for shortness of breath. Negative for apnea, cough, choking, chest tightness, wheezing and stridor.   Cardiovascular: Negative.   Gastrointestinal: Positive for diarrhea. Negative for abdominal distention, abdominal pain, anal bleeding, blood in stool, constipation, nausea, rectal pain and vomiting.  Endocrine: Positive for cold intolerance and heat intolerance. Negative for polydipsia, polyphagia and polyuria.  Genitourinary: Negative.   Musculoskeletal: Positive for arthralgias, back pain, gait problem, joint swelling and neck stiffness. Negative for myalgias and neck pain.  Skin: Negative.   Allergic/Immunologic: Negative.   Neurological: Positive for dizziness and weakness. Negative for tremors, seizures, syncope, facial asymmetry, speech difficulty, light-headedness, numbness and headaches.  Hematological: Negative.   Psychiatric/Behavioral: Positive for decreased concentration and sleep disturbance. Negative for agitation, behavioral problems, confusion, dysphoric mood, hallucinations, self-injury and suicidal ideas.  The patient is not nervous/anxious and is not hyperactive.     Social History   Socioeconomic History  . Marital status: Married    Spouse name: [name]  . Number of children: 3  . Years of education: Not on file  . Highest education level: 12th grade  Occupational History  . Occupation: retired  Tobacco Use  . Smoking status: Never Smoker  . Smokeless tobacco: Never Used  Substance and Sexual Activity  . Alcohol use: No  . Drug use: No  . Sexual activity: Not on file  Other Topics Concern  . Not on file  Social History Narrative  . Not on file   Social Determinants of Health   Financial Resource Strain: Low Risk   . Difficulty of Paying Living Expenses: Not hard at all  Food Insecurity: No Food Insecurity  . Worried About Charity fundraiser in the Last Year: Never true  . Ran Out of Food in the Last Year: Never true  Transportation Needs: No Transportation Needs  . Lack of Transportation (Medical): No  . Lack of Transportation (Non-Medical): No  Physical Activity: Inactive  . Days of Exercise per Week: 0 days  . Minutes of Exercise per Session: 0 min  Stress: No Stress Concern Present  . Feeling of Stress : Only a little  Social Connections: Slightly Isolated  . Frequency of Communication with Friends and Family: More than three times a week  . Frequency of Social Gatherings with Friends and Family: Twice a week  . Attends Religious Services: More than 4 times per year  . Active Member of Clubs or Organizations: No  . Attends Archivist Meetings: Never  . Marital Status: Married  Human resources officer Violence: Not At Risk  . Fear of Current or Ex-Partner: No  . Emotionally Abused: No  . Physically Abused: No  .  Sexually Abused: No    Past Medical History:  Diagnosis Date  . Asthma   . Bone spur    heel  . Depression   . Gout   . Hypertension      Patient Active Problem List   Diagnosis Date Noted  . Degenerative arthritis of knee, bilateral  05/13/2018  . Pes planus 05/13/2018  . Bronchitis 09/19/2015  . Hypertension 04/01/2015  . Abnormal finding on mammography 09/21/2014  . Hypothyroidism 09/21/2014  . Class 2 severe obesity due to excess calories with serious comorbidity and body mass index (BMI) of 35.0 to 35.9 in adult ([place]) 09/21/2014  . Chronic kidney disease, stage III (moderate) ([place]) 09/21/2014  . Type 2 diabetes mellitus with stage 3b chronic kidney disease, without long-term current use of insulin ([place]) 09/21/2014  . Depressive disorder 09/21/2014  . Accumulation of fluid in tissues 09/21/2014  . Esophageal reflux 09/21/2014  . Gout 09/21/2014  . Hypercholesteremia 09/21/2014  . Insomnia 09/21/2014  . Vitamin D deficiency, unspecified 09/21/2014  . Low back pain 01/15/2014  . Other chest pain 01/15/2014  . Atrophic vaginitis 01/15/2014    Past Surgical History:  Procedure Laterality Date  . ABDOMINAL HYSTERECTOMY  04/1970   partial  . CHOLECYSTECTOMY  late 1990's    Her family history includes CAD in her mother; Diabetes in her cousin; Heart attack in her mother; Heart disease in her father.   Current Outpatient Medications:  .  Acetaminophen (EQ ARTHRITIS PAIN PO), Take 1 tablet by mouth 2 (two) times daily. As needed, Disp: , Rfl:  .  allopurinol (ZYLOPRIM) 100 MG tablet, TAKE 1 TABLET BY MOUTH EVERY DAY, Disp: 90 tablet, Rfl: 1 .  atorvastatin (LIPITOR) 40 MG tablet, TAKE 1 TABLET BY MOUTH EVERY DAY, Disp: 90 tablet, Rfl: 3 .  B Complex Vitamins (GNP VITAMIN B COMPLEX PO), Take by mouth daily. , Disp: , Rfl:  .  colchicine 0.6 MG tablet, TAKE 1 TABLET BY MOUTH TWICE A DAY (Patient taking differently: TAKE 1 TABLET BY MOUTH TWICE A DAY as needed), Disp: 60 tablet, Rfl: 5 .  escitalopram (LEXAPRO) 20 MG tablet, TAKE 1 TABLET BY MOUTH EVERY DAY, Disp: 90 tablet, Rfl: 3 .  famotidine (PEPCID) 40 MG tablet, Take 1 tablet (40 mg total) by mouth daily., Disp: 90 tablet, Rfl: 1 .  ferrous sulfate 325 (65 FE)  MG EC tablet, Take 325 mg by mouth daily., Disp: , Rfl:  .  furosemide (LASIX) 40 MG tablet, TAKE 1 TABLET BY MOUTH EVERY DAY, Disp: 90 tablet, Rfl: 1 .  glipiZIDE (GLUCOTROL XL) 10 MG 24 hr tablet, Take 1 tablet (10 mg total) by mouth daily with breakfast., Disp: 90 tablet, Rfl: 1 .  levothyroxine (SYNTHROID) 75 MCG tablet, TAKE 1 TABLET BY MOUTH EVERY DAY, Disp: 90 tablet, Rfl: 1 .  magnesium 30 MG tablet, Take 30 mg by mouth daily. Unsure of dose, Disp: , Rfl:  .  meloxicam (MOBIC) 15 MG tablet, TAKE 1 TABLET BY MOUTH EVERY DAY WITH MEALS, Disp: , Rfl: 1 .  traMADol (ULTRAM) 50 MG tablet, TAKE ONE TABLET EVERY SIX HOURS AS NEEDED FOR PAIN, Disp: , Rfl: 0 .  Vitamin D, Ergocalciferol, (DRISDOL) 1.25 MG (50000 UNIT) CAPS capsule, TAKE 1 CAPSULE BY MOUTH ONE TIME PER WEEK, Disp: 12 capsule, Rfl: 1 .  Blood Glucose Monitoring Suppl (ONETOUCH VERIO) w/Device KIT, To check blood sugar once daily, Disp: 1 kit, Rfl: 0 .  glucose blood (ONETOUCH VERIO) test strip, To  check blood sugar once daily, Disp: 100 each, Rfl: 12 .  Lancets (ONETOUCH ULTRASOFT) lancets, To check blood sugar once daily, Disp: 100 each, Rfl: 12 .  zaleplon (SONATA) 5 MG capsule, Take 1 capsule (5 mg total) by mouth at bedtime as needed for sleep., Disp: 30 capsule, Rfl: 1  Patient Care Team: Mar Daring, PA-C as PCP - General (Family Medicine) Lorelee Cover., MD as Consulting Physician (Ophthalmology) Lovell Sheehan, MD as Consulting Physician (Orthopedic Surgery)     Objective:    Vitals: BP 126/64   Pulse 83   Temp 97.9 F (36.6 C)   Ht 5' 3"  (1.6 m)   Wt 199 lb (90.3 kg)   BMI 35.25 kg/m   Physical Exam  Activities of Daily Living In your present state of health, do you have any difficulty performing the following activities: 04/20/2019  Hearing? N  Vision? N  Difficulty concentrating or making decisions? Y  Walking or climbing stairs? Y  Comment Due to low energy.  Dressing or bathing? N  Doing  errands, shopping? N  Preparing Food and eating ? N  Using the Toilet? N  In the past six months, have you accidently leaked urine? Y  Comment Occasionally with urges.  Do you have problems with loss of bowel control? N  Managing your Medications? N  Managing your Finances? N  Housekeeping or managing your Housekeeping? N  Some recent data might be hidden    Fall Risk Assessment Fall Risk  04/20/2019 04/14/2018 02/07/2018 04/09/2017 04/04/2017  Falls in the past year? 0 1 1 No No  Number falls in past yr: 0 0 0 - -  Injury with Fall? 0 0 0 - -  Follow up - Falls prevention discussed - - -     Depression Screen PHQ 2/9 Scores 04/20/2019 04/14/2018 04/04/2017 11/20/2016  PHQ - 2 Score 4 2 1  0  PHQ- 9 Score 16 11 - 8    6CIT Screen 04/14/2018  What Year? 0 points  What month? 0 points  What time? 0 points  Count back from 20 0 points  Months in reverse 0 points  Repeat phrase 0 points  Total Score 0       Assessment & Plan:    Annual Physical Reviewed patient's Family Medical History Reviewed and updated list of patient's medical providers Assessment of cognitive impairment was done Assessed patient's functional ability Established a written schedule for health screening [place] Completed and Reviewed  Exercise Activities and Dietary recommendations Goals    . Exercise 150 minutes per week (moderate activity)     Recommend to start back with water aerobics for 3 days a week for 55 minutes.     . Increase water intake     Starting 03/20/16, I will increase my water intake to 6 glasses a day.    . Peak Blood Glucose < 180       Immunization History  Administered Date(s) Administered  . Influenza Split 12/16/2009, 03/21/2011, 01/30/2012  . Influenza, High Dose Seasonal PF 02/09/2015, 03/20/2016, 12/24/2016, 02/07/2018  . Influenza,inj,Quad PF,6+ Mos 02/06/2013, 12/03/2013  . Pneumococcal Conjugate-13 12/03/2013  . Pneumococcal Polysaccharide-23  03/21/2011  . Tdap 04/09/2008    Health Maintenance  Topic Date Due  . OPHTHALMOLOGY EXAM  03/01/2017  . FOOT EXAM  05/23/2017  . INFLUENZA VACCINE  11/01/2018  . TETANUS/TDAP  04/19/2020 (Originally 04/09/2018)  . MAMMOGRAM  04/23/2019  . HEMOGLOBIN A1C  07/03/2019  . URINE  MICROALBUMIN  04/19/2020  . Fecal DNA (Cologuard)  02/28/2021  . DEXA SCAN  Completed  . Hepatitis C Screening  Completed  . PNA vac Low Risk Adult  Completed     Discussed health benefits of physical activity, and encouraged her to engage in regular exercise appropriate for her age and condition.    1. Annual physical exam Normal physical exam today. Will check labs as below and f/u pending lab results. If labs are stable and WNL she will not need to have these rechecked for one year at her next annual physical exam. She is to call the office in the meantime if she has any acute issue, questions or concerns. - CBC w/Diff/Platelet - Comprehensive Metabolic Panel (CMET) - Lipid Profile - Vitamin D (25 hydroxy) - POCT UA - Microalbumin  2. Mild episode of recurrent major depressive disorder ([place]) PHQ9 worsened compared to last check. Reports it is due to sleeping issues. Overall she feels stable on Lexapro 108m.   3. Type 2 diabetes mellitus with stage 3b chronic kidney disease, without long-term current use of insulin ([facility]) Continue Glipizide XR 12m Unable to tolerate metformin due to GI side effects. Will check labs as below and f/u pending results. - CBC w/Diff/Platelet - Comprehensive Metabolic Panel (CMET) - Lipid Profile - POCT UA - Microalbumin - Lancets (ONETOUCH ULTRASOFT) lancets; To check blood sugar once daily  Dispense: 100 each; Refill: 12 - glucose blood (ONETOUCH VERIO) test strip; To check blood sugar once daily  Dispense: 100 each; Refill: 12 - Blood Glucose Monitoring Suppl (ONETOUCH VERIO) w/Device KIT; To check blood sugar once daily  Dispense: 1 kit; Refill: 0 - POCT HgB A1C  4.  Class 2 severe obesity due to excess calories with serious comorbidity and body mass index (BMI) of 35.0 to 35.9 in adult [facility] healthy lifestyle modifications including dieting and exercise.  - CBC w/Diff/Platelet - Comprehensive Metabolic Panel (CMET) - Lipid Profile  5. Essential hypertension Stable. Will check labs as below and f/u pending results. - CBC w/Diff/Platelet - Comprehensive Metabolic Panel (CMET) - Lipid Profile  6. Vitamin D deficiency, unspecified H/O this and postmenopausal. On high dose supplement. Will check labs as below and f/u pending results. - CBC w/Diff/Platelet - Vitamin D (25 hydroxy)  7. Hypercholesteremia Stable. Continue Atorvastatin 4062mWill check labs as below and f/u pending results. - CBC w/Diff/Platelet - Comprehensive Metabolic Panel (CMET) - Lipid Profile  8. Primary insomnia Worsening. Failed Trazodone and melatonin. Will try Sonata 5mg8m below. Call if not improving or is side effects occur.  - zaleplon (SONATA) 5 MG capsule; Take 1 capsule (5 mg total) by mouth at bedtime as needed for sleep.  Dispense: 30 capsule; Refill: 1  9. Gastroesophageal reflux disease without esophagitis Previously used Zantac. Will try Famotidine as below. If not covering symptoms well, she is to call the office and will consider changing to Omeprazole.  - famotidine (PEPCID) 40 MG tablet; Take 1 tablet (40 mg total) by mouth daily.  Dispense: 90 tablet; Refill: 1  10. Hypothyroidism due to acquired atrophy of thyroid Stable. Continue Levothyroxine 75mc100mill check labs as below and f/u pending results. - TSH  11. Need for influenza vaccination Flu vaccine given today without complication. Patient sat upright for 15 minutes to check for adverse reaction before being released. - Flu Vaccine QUAD High Dose(Fluad)  ------------------------------------------------------------------------------------------------------------    JenniMar DaringC  [facility]

## 2019-04-21 ENCOUNTER — Telehealth: Payer: Self-pay

## 2019-04-21 LAB — COMPREHENSIVE METABOLIC PANEL
ALT: 16 IU/L (ref 0–32)
AST: 20 IU/L (ref 0–40)
Albumin/Globulin Ratio: 1.6 (ref 1.2–2.2)
Albumin: 3.9 g/dL (ref 3.7–4.7)
Alkaline Phosphatase: 65 IU/L (ref 39–117)
BUN/Creatinine Ratio: 10 — ABNORMAL LOW (ref 12–28)
BUN: 15 mg/dL (ref 8–27)
Bilirubin Total: 0.4 mg/dL (ref 0.0–1.2)
CO2: 22 mmol/L (ref 20–29)
Calcium: 9.2 mg/dL (ref 8.7–10.3)
Chloride: 102 mmol/L (ref 96–106)
Creatinine, Ser: 1.55 mg/dL — ABNORMAL HIGH (ref 0.57–1.00)
GFR calc Af Amer: 37 mL/min/{1.73_m2} — ABNORMAL LOW (ref >59)
GFR calc non Af Amer: 33 mL/min/{1.73_m2} — ABNORMAL LOW (ref >59)
Globulin, Total: 2.5 g/dL (ref 1.5–4.5)
Glucose: 127 mg/dL — ABNORMAL HIGH (ref 65–99)
Potassium: 3.7 mmol/L (ref 3.5–5.2)
Sodium: 139 mmol/L (ref 134–144)
Total Protein: 6.4 g/dL (ref 6.0–8.5)

## 2019-04-21 LAB — CBC WITH DIFFERENTIAL/PLATELET
Basophils Absolute: 0.1 10*3/uL (ref 0.0–0.2)
Basos: 1 %
EOS (ABSOLUTE): 0.4 10*3/uL (ref 0.0–0.4)
Eos: 3 %
Hematocrit: 36.4 % (ref 34.0–46.6)
Hemoglobin: 12.3 g/dL (ref 11.1–15.9)
Immature Grans (Abs): 0.1 10*3/uL (ref 0.0–0.1)
Immature Granulocytes: 1 %
Lymphocytes Absolute: 3.7 10*3/uL — ABNORMAL HIGH (ref 0.7–3.1)
Lymphs: 31 %
MCH: 33 pg (ref 26.6–33.0)
MCHC: 33.8 g/dL (ref 31.5–35.7)
MCV: 98 fL — ABNORMAL HIGH (ref 79–97)
Monocytes Absolute: 0.9 10*3/uL (ref 0.1–0.9)
Monocytes: 8 %
Neutrophils Absolute: 6.7 10*3/uL (ref 1.4–7.0)
Neutrophils: 56 %
Platelets: 316 10*3/uL (ref 150–450)
RBC: 3.73 x10E6/uL — ABNORMAL LOW (ref 3.77–5.28)
RDW: 13.2 % (ref 11.7–15.4)
WBC: 11.8 10*3/uL — ABNORMAL HIGH (ref 3.4–10.8)

## 2019-04-21 LAB — LIPID PANEL
Chol/HDL Ratio: 4.2 ratio (ref 0.0–4.4)
Cholesterol, Total: 172 mg/dL (ref 100–199)
HDL: 41 mg/dL (ref >39)
LDL Chol Calc (NIH): 82 mg/dL (ref 0–99)
Triglycerides: 303 mg/dL — ABNORMAL HIGH (ref 0–149)
VLDL Cholesterol Cal: 49 mg/dL — ABNORMAL HIGH (ref 5–40)

## 2019-04-21 LAB — TSH: TSH: 1.62 u[IU]/mL (ref 0.450–4.500)

## 2019-04-21 LAB — VITAMIN D 25 HYDROXY (VIT D DEFICIENCY, FRACTURES): Vit D, 25-Hydroxy: 70.4 ng/mL (ref 30.0–100.0)

## 2019-04-21 NOTE — Telephone Encounter (Signed)
Patient advised as below. Patient verbalizes understanding and is in agreement with treatment plan.  

## 2019-04-21 NOTE — Telephone Encounter (Signed)
-----   Message from Mar Daring, PA-C sent at 04/21/2019  9:57 AM EST ----- WBC count is borderline high. Would recommend for [place] to recheck in 2-4 weeks. Rest of blood count in normal. Kidney function is stable and slightly improved from creatinine of 1.65 to now 1.55. Liver enzymes have also normalized compared to 3 months ago. Cholesterol is much improved. Vit D is normal. Thyroid is normal.

## 2019-04-30 ENCOUNTER — Other Ambulatory Visit: Payer: Self-pay | Admitting: Physician Assistant

## 2019-04-30 DIAGNOSIS — F3342 Major depressive disorder, recurrent, in full remission: Secondary | ICD-10-CM

## 2019-05-05 DIAGNOSIS — E113393 Type 2 diabetes mellitus with moderate nonproliferative diabetic retinopathy without macular edema, bilateral: Secondary | ICD-10-CM | POA: Diagnosis not present

## 2019-05-14 ENCOUNTER — Telehealth: Payer: Self-pay | Admitting: Physician Assistant

## 2019-05-14 ENCOUNTER — Other Ambulatory Visit: Payer: Self-pay

## 2019-05-14 DIAGNOSIS — F5101 Primary insomnia: Secondary | ICD-10-CM

## 2019-05-14 DIAGNOSIS — D72829 Elevated white blood cell count, unspecified: Secondary | ICD-10-CM

## 2019-05-14 MED ORDER — ZALEPLON 10 MG PO CAPS: 10.0000 mg | ORAL_CAPSULE | Freq: Every evening | ORAL | 1 refills | Status: DC | PRN

## 2019-05-14 NOTE — Telephone Encounter (Signed)
Copied from [place] [id number]. Topic: General - Inquiry >> May 14, 2019  3:00 PM Richardo Priest, [place] wrote: Reason for CRM: Pt called in stating she is needing to have a redo on lab work and pt states she was told to call PCP and she would write orders. Please advise.

## 2019-05-14 NOTE — Telephone Encounter (Signed)
Patient called in stating she is needing to have a refill for zaleplon (SONATA) 5 MG capsule, but states its not working for her. Pt expressed that PCP stated she can have it doubled for her and have as a 90 day refill for next time. Please advise.

## 2019-05-14 NOTE — Telephone Encounter (Signed)
Patient is requesting a increased dose of Sonata for a 90 day supply be sent to [facility].    Patient advised lab slip is at the front desk for pickup to recheck WBC.

## 2019-05-14 NOTE — Telephone Encounter (Signed)
Refilled Sonata 10mg .

## 2019-05-15 DIAGNOSIS — D72829 Elevated white blood cell count, unspecified: Secondary | ICD-10-CM | POA: Diagnosis not present

## 2019-05-16 LAB — CBC WITH DIFFERENTIAL/PLATELET
Basophils Absolute: 0.1 10*3/uL (ref 0.0–0.2)
Basos: 1 %
EOS (ABSOLUTE): 0.4 10*3/uL (ref 0.0–0.4)
Eos: 4 %
Hematocrit: 37 % (ref 34.0–46.6)
Hemoglobin: 12.9 g/dL (ref 11.1–15.9)
Immature Grans (Abs): 0 10*3/uL (ref 0.0–0.1)
Immature Granulocytes: 0 %
Lymphocytes Absolute: 3.3 10*3/uL — ABNORMAL HIGH (ref 0.7–3.1)
Lymphs: 29 %
MCH: 34.1 pg — ABNORMAL HIGH (ref 26.6–33.0)
MCHC: 34.9 g/dL (ref 31.5–35.7)
MCV: 98 fL — ABNORMAL HIGH (ref 79–97)
Monocytes Absolute: 0.9 10*3/uL (ref 0.1–0.9)
Monocytes: 8 %
Neutrophils Absolute: 6.5 10*3/uL (ref 1.4–7.0)
Neutrophils: 58 %
Platelets: 318 10*3/uL (ref 150–450)
RBC: 3.78 x10E6/uL (ref 3.77–5.28)
RDW: 13.4 % (ref 11.7–15.4)
WBC: 11.3 10*3/uL — ABNORMAL HIGH (ref 3.4–10.8)

## 2019-05-18 ENCOUNTER — Telehealth: Payer: Self-pay

## 2019-05-18 NOTE — Telephone Encounter (Signed)
-----   Message from Mar Daring, PA-C sent at 05/18/2019  7:59 AM EST ----- WBC count is essentially unchanged from 4 weeks ago. This can be from stress or from a viral URI or UTI. I would recommend for [place] to give your body time and recheck in 2-3 months. Can call for lab slip, appt not needed.

## 2019-05-18 NOTE — Telephone Encounter (Signed)
LMTCB or to view message through my chart. IF patient calls back OK for triage nurse to give message

## 2019-05-18 NOTE — Telephone Encounter (Signed)
Called pt. And she was given results and PCP recommendations. Verbalizes understanding.

## 2019-05-28 ENCOUNTER — Other Ambulatory Visit: Payer: Self-pay | Admitting: Physician Assistant

## 2019-05-28 DIAGNOSIS — E038 Other specified hypothyroidism: Secondary | ICD-10-CM

## 2019-05-29 DIAGNOSIS — H02839 Dermatochalasis of unspecified eye, unspecified eyelid: Secondary | ICD-10-CM | POA: Diagnosis not present

## 2019-05-29 DIAGNOSIS — E119 Type 2 diabetes mellitus without complications: Secondary | ICD-10-CM | POA: Diagnosis not present

## 2019-05-29 DIAGNOSIS — H18413 Arcus senilis, bilateral: Secondary | ICD-10-CM | POA: Diagnosis not present

## 2019-05-29 DIAGNOSIS — H2513 Age-related nuclear cataract, bilateral: Secondary | ICD-10-CM | POA: Diagnosis not present

## 2019-06-09 ENCOUNTER — Other Ambulatory Visit: Payer: Self-pay | Admitting: Physician Assistant

## 2019-06-09 DIAGNOSIS — M10471 Other secondary gout, right ankle and foot: Secondary | ICD-10-CM

## 2019-06-09 DIAGNOSIS — E119 Type 2 diabetes mellitus without complications: Secondary | ICD-10-CM

## 2019-06-09 DIAGNOSIS — N1832 Chronic kidney disease, stage 3b: Secondary | ICD-10-CM

## 2019-06-09 DIAGNOSIS — E1122 Type 2 diabetes mellitus with diabetic chronic kidney disease: Secondary | ICD-10-CM

## 2019-06-09 NOTE — Telephone Encounter (Signed)
Requested Prescriptions  Pending Prescriptions Disp Refills  . metFORMIN (GLUCOPHAGE) 1000 MG tablet [Pharmacy Med Name: METFORMIN HCL 1,000 MG TABLET] 180 tablet 0    Sig: TAKE 1 TABLET (1,000 MG TOTAL) BY MOUTH 2 (TWO) TIMES DAILY WITH A MEAL.     Endocrinology:  Diabetes - Biguanides Failed - 06/09/2019  7:34 PM      Failed - Cr in normal range and within 360 days    Creatinine, Ser  Date Value Ref Range Status  04/20/2019 1.55 (H) 0.57 - 1.00 mg/dL Final         Failed - eGFR in normal range and within 360 days    GFR calc Af Amer  Date Value Ref Range Status  04/20/2019 37 (L) >59 mL/min/1.73 Final   GFR calc non Af Amer  Date Value Ref Range Status  04/20/2019 33 (L) >59 mL/min/1.73 Final         Passed - HBA1C is between 0 and 7.9 and within 180 days    Hemoglobin A1C  Date Value Ref Range Status  04/20/2019 6.4 (A) 4.0 - 5.6 % Final   Hgb A1c MFr Bld  Date Value Ref Range Status  01/02/2019 6.3 (H) 4.8 - 5.6 % Final    Comment:             Prediabetes: 5.7 - 6.4          Diabetes: >6.4          Glycemic control for adults with diabetes: <7.0          Passed - Valid encounter within last 6 months    Recent Outpatient Visits          1 month ago Annual physical exam   [facility] [name] M, [place]   5 months ago Essential hypertension   [place], Clearnce Sorrel, [place]   1 year ago Annual physical exam   [place], [place], [place]   1 year ago Type 2 diabetes mellitus without complication, without long-term current use of insulin [facility])   [facility] [place], Clearnce Sorrel, [place]   2 years ago Annual physical exam   [place], Clearnce Sorrel, [place]      Future Appointments            In 4 months Burnette, Clearnce Sorrel, PA-C [employer], [facility]           . allopurinol (ZYLOPRIM) 100 MG tablet [Pharmacy Med Name: ALLOPURINOL 100 MG TABLET] 90  tablet 1    Sig: TAKE 1 TABLET BY MOUTH EVERY DAY     Endocrinology:  Gout Agents Failed - 06/09/2019  7:34 PM      Failed - Uric Acid in normal range and within 360 days    Uric Acid, Serum  Date Value Ref Range Status  10/29/2015 11.3 (H) 2.3 - 6.6 mg/dL Final         Failed - Cr in normal range and within 360 days    Creatinine, Ser  Date Value Ref Range Status  04/20/2019 1.55 (H) 0.57 - 1.00 mg/dL Final         Passed - Valid encounter within last 12 months    Recent Outpatient Visits          1 month ago Annual physical exam   [facility] [name] M, [place]   5 months ago Essential hypertension   [facility] [place], [place]  M, PA-C   1 year ago Annual physical exam   [facility] [name] M, [place]   1 year ago Type 2 diabetes mellitus without complication, without long-term current use of insulin [facility])   [facility] [place], Clearnce Sorrel, [place]   2 years ago Annual physical exam   [facility] [place], [place]      Future Appointments            In 4 months Burnette, Clearnce Sorrel, PA-C [employer], [place]           . glipiZIDE (GLUCOTROL XL) 10 MG 24 hr tablet [Pharmacy Med Name: GLIPIZIDE ER 10 MG TABLET] 90 tablet 1    Sig: TAKE 1 TABLET (10 MG TOTAL) BY MOUTH DAILY WITH BREAKFAST.     Endocrinology:  Diabetes - Sulfonylureas Passed - 06/09/2019  7:34 PM      Passed - HBA1C is between 0 and 7.9 and within 180 days    Hemoglobin A1C  Date Value Ref Range Status  04/20/2019 6.4 (A) 4.0 - 5.6 % Final   Hgb A1c MFr Bld  Date Value Ref Range Status  01/02/2019 6.3 (H) 4.8 - 5.6 % Final    Comment:             Prediabetes: 5.7 - 6.4          Diabetes: >6.4          Glycemic control for adults with diabetes: <7.0          Passed - Valid encounter within last 6 months    Recent Outpatient Visits          1 month ago Annual physical exam    [facility] [name] M, [place]   5 months ago Essential hypertension   [place], Clearnce Sorrel, [place]   1 year ago Annual physical exam   [place], [place], [place]   1 year ago Type 2 diabetes mellitus without complication, without long-term current use of insulin [facility])   [facility] [place], Clearnce Sorrel, [place]   2 years ago Annual physical exam   [place], [place]      Future Appointments            In 4 months Burnette, Clearnce Sorrel, PA-C [employer], [place]

## 2019-06-10 DIAGNOSIS — H2511 Age-related nuclear cataract, right eye: Secondary | ICD-10-CM | POA: Diagnosis not present

## 2019-06-11 ENCOUNTER — Other Ambulatory Visit: Payer: Self-pay | Admitting: Physician Assistant

## 2019-06-11 DIAGNOSIS — N1832 Chronic kidney disease, stage 3b: Secondary | ICD-10-CM

## 2019-06-11 DIAGNOSIS — H2512 Age-related nuclear cataract, left eye: Secondary | ICD-10-CM | POA: Diagnosis not present

## 2019-06-11 MED ORDER — ALLOPURINOL 100 MG PO TABS: 100.0000 mg | ORAL_TABLET | Freq: Every day | ORAL | 1 refills | Status: DC

## 2019-06-11 MED ORDER — METFORMIN HCL 1000 MG PO TABS: 1000.0000 mg | ORAL_TABLET | Freq: Two times a day (BID) | ORAL | 3 refills | Status: DC

## 2019-06-11 NOTE — Telephone Encounter (Signed)
Patient reports that she needs refills on the Metformin and explained to patient that in the last office visit she reported not able to tolerate Metformin due to GI side effects.  She reports that she decided to go back on it and if you can please send a new script for her. [facility]anracy: [facility] [address]

## 2019-06-11 NOTE — Progress Notes (Signed)
Patient restarted metformin on her own. Had stopped in January 2021 due to side effects.

## 2019-06-11 NOTE — Addendum Note (Signed)
Addended by: Doristine Devoid on: 06/11/2019 03:32 PM   Modules accepted: Orders

## 2019-06-11 NOTE — Telephone Encounter (Signed)
Pt calling in to find out why these medications aren't being refilled.  Pt states that she is not completely out but is very close to being out and would like to go ahead and get the refills done.

## 2019-06-19 ENCOUNTER — Ambulatory Visit: Payer: PPO | Attending: Internal Medicine

## 2019-06-19 DIAGNOSIS — Z23 Encounter for immunization: Secondary | ICD-10-CM

## 2019-06-19 NOTE — Progress Notes (Signed)
   Covid-19 Vaccination Clinic  Name:  Donna Malone    MRN: [id number] DOB: 24-Jun-1945  06/19/2019  [name] was observed post Covid-19 immunization for 15 minutes without incident. She was provided with Vaccine Information Sheet and instruction to access the V-Safe system.   [name] was instructed to call 911 with any severe reactions post vaccine: Marland Kitchen Difficulty breathing  . Swelling of face and throat  . A fast heartbeat  . A bad rash all over body  . Dizziness and weakness   Immunizations Administered    Name Date Dose VIS Date Route   Pfizer COVID-19 Vaccine 06/19/2019 10:19 AM 0.3 mL 03/13/2019 Intramuscular   Manufacturer: [place]   Lot: [id number]   [place]: [address]-8

## 2019-06-28 ENCOUNTER — Other Ambulatory Visit: Payer: Self-pay | Admitting: Physician Assistant

## 2019-06-28 DIAGNOSIS — E119 Type 2 diabetes mellitus without complications: Secondary | ICD-10-CM

## 2019-07-09 ENCOUNTER — Other Ambulatory Visit: Payer: Self-pay | Admitting: Physician Assistant

## 2019-07-09 DIAGNOSIS — R609 Edema, unspecified: Secondary | ICD-10-CM

## 2019-07-14 ENCOUNTER — Ambulatory Visit: Payer: PPO | Attending: Internal Medicine

## 2019-07-14 DIAGNOSIS — Z23 Encounter for immunization: Secondary | ICD-10-CM

## 2019-07-14 NOTE — Progress Notes (Signed)
   Covid-19 Vaccination Clinic  Name:  HAVANNA GRONER    MRN: [id number] DOB: 15-Mar-1946  07/14/2019  Ms. Mccadden was observed post Covid-19 immunization for 15 minutes without incident. She was provided with Vaccine Information Sheet and instruction to access the V-Safe system.   Ms. Pitstick was instructed to call 911 with any severe reactions post vaccine: Marland Kitchen Difficulty breathing  . Swelling of face and throat  . A fast heartbeat  . A bad rash all over body  . Dizziness and weakness   Immunizations Administered    Name Date Dose VIS Date Route   Pfizer COVID-19 Vaccine 07/14/2019 10:59 AM 0.3 mL 03/13/2019 Intramuscular   Manufacturer: [place]   Lot: [id number]   [place]: [address]-9

## 2019-07-15 ENCOUNTER — Ambulatory Visit: Payer: PPO

## 2019-07-15 DIAGNOSIS — H2512 Age-related nuclear cataract, left eye: Secondary | ICD-10-CM | POA: Diagnosis not present

## 2019-07-19 DIAGNOSIS — H2512 Age-related nuclear cataract, left eye: Secondary | ICD-10-CM | POA: Diagnosis not present

## 2019-08-14 ENCOUNTER — Telehealth: Payer: Self-pay | Admitting: Physician Assistant

## 2019-08-14 NOTE — Chronic Care Management (AMB) (Signed)
  Chronic Care Management   Note  08/14/2019 Name: [name] MRN: [id number] DOB: 07-Dec-1945  Donna Malone is a 74 y.o. year old female who is a primary care patient of Rubye [facility]. I reached out to [employer] by phone today in response to a referral sent by Ms. Ashika T Scrivner's health plan.     Ms. Cornforth was given information about Chronic Care Management services today including:  1. CCM service includes personalized support from designated clinical staff supervised by her physician, including individualized plan of care and coordination with other care providers 2. 24/7 contact phone numbers for assistance for urgent and routine care needs. 3. Service will only be billed when office clinical staff spend 20 minutes or more in a month to coordinate care. 4. Only one practitioner may furnish and bill the service in a calendar month. 5. The patient may stop CCM services at any time (effective at the end of the month) by phone call to the office staff. 6. The patient will be responsible for cost sharing (co-pay) of up to 20% of the service fee (after annual deductible is met).  Patient did not agree to enrollment in care management services and does not wish to consider at this time.  Follow up plan: The patient has been provided with contact information for the care management team and has been advised to call with any health related questions or concerns.   [place], [place] [address] Direct Dial: [phone] [email or web address]: [email or web address]

## 2019-09-28 ENCOUNTER — Other Ambulatory Visit: Payer: Self-pay | Admitting: Physician Assistant

## 2019-09-28 DIAGNOSIS — E559 Vitamin D deficiency, unspecified: Secondary | ICD-10-CM

## 2019-09-28 NOTE — Telephone Encounter (Signed)
Vitamin D was normal in January, do you want to continue her at this dose

## 2019-10-11 ENCOUNTER — Other Ambulatory Visit: Payer: Self-pay | Admitting: Physician Assistant

## 2019-10-11 DIAGNOSIS — K219 Gastro-esophageal reflux disease without esophagitis: Secondary | ICD-10-CM

## 2019-10-11 NOTE — Telephone Encounter (Signed)
Requested Prescriptions  Pending Prescriptions Disp Refills  . famotidine (PEPCID) 40 MG tablet [Pharmacy Med Name: FAMOTIDINE 40 MG TABLET] 90 tablet 1    Sig: TAKE 1 TABLET BY MOUTH EVERY DAY     Gastroenterology:  H2 Antagonists Passed - 10/11/2019 12:49 AM      Passed - Valid encounter within last 12 months    Recent Outpatient Visits          5 months ago Annual physical exam   [facility] [name] M, [place]   9 months ago Essential hypertension   [place], Clearnce Sorrel, [place]   1 year ago Annual physical exam   [facility] [name] M, [place]   1 year ago Type 2 diabetes mellitus without complication, without long-term current use of insulin [facility])   [facility] [place], Clearnce Sorrel, [place]   2 years ago Annual physical exam   [place], Clearnce Sorrel, [place]      Future Appointments            In 1 week Marlyn Corporal, Clearnce Sorrel, PA-C [employer], [place]

## 2019-10-22 ENCOUNTER — Encounter: Payer: Self-pay | Admitting: Physician Assistant

## 2019-10-22 ENCOUNTER — Other Ambulatory Visit: Payer: Self-pay

## 2019-10-22 ENCOUNTER — Ambulatory Visit (INDEPENDENT_AMBULATORY_CARE_PROVIDER_SITE_OTHER): Payer: PPO | Admitting: Physician Assistant

## 2019-10-22 VITALS — BP 116/76 | HR 83 | Temp 96.2°F | Wt 193.0 lb

## 2019-10-22 DIAGNOSIS — E7800 Pure hypercholesterolemia, unspecified: Secondary | ICD-10-CM | POA: Diagnosis not present

## 2019-10-22 DIAGNOSIS — D72829 Elevated white blood cell count, unspecified: Secondary | ICD-10-CM | POA: Diagnosis not present

## 2019-10-22 DIAGNOSIS — F329 Major depressive disorder, single episode, unspecified: Secondary | ICD-10-CM | POA: Diagnosis not present

## 2019-10-22 DIAGNOSIS — I10 Essential (primary) hypertension: Secondary | ICD-10-CM

## 2019-10-22 DIAGNOSIS — E1122 Type 2 diabetes mellitus with diabetic chronic kidney disease: Secondary | ICD-10-CM

## 2019-10-22 DIAGNOSIS — M79601 Pain in right arm: Secondary | ICD-10-CM

## 2019-10-22 DIAGNOSIS — F5101 Primary insomnia: Secondary | ICD-10-CM

## 2019-10-22 DIAGNOSIS — E1121 Type 2 diabetes mellitus with diabetic nephropathy: Secondary | ICD-10-CM

## 2019-10-22 DIAGNOSIS — E669 Obesity, unspecified: Secondary | ICD-10-CM

## 2019-10-22 DIAGNOSIS — N1832 Chronic kidney disease, stage 3b: Secondary | ICD-10-CM | POA: Diagnosis not present

## 2019-10-22 DIAGNOSIS — F32A Depression, unspecified: Secondary | ICD-10-CM

## 2019-10-22 DIAGNOSIS — E034 Atrophy of thyroid (acquired): Secondary | ICD-10-CM

## 2019-10-22 DIAGNOSIS — E66811 Obesity, class 1: Secondary | ICD-10-CM

## 2019-10-22 LAB — POCT GLYCOSYLATED HEMOGLOBIN (HGB A1C): Hemoglobin A1C: 6.6 % — AB (ref 4.0–5.6)

## 2019-10-22 NOTE — Assessment & Plan Note (Addendum)
Previously well controlled Continue Synthroid at current dose, 53mcg  Recheck TSH and adjust Synthroid as indicated

## 2019-10-22 NOTE — Progress Notes (Signed)
I,Laura E Walsh,acting as a Education administrator for [employer], PA-C.,have documented all relevant documentation on the behalf of Mar Daring, PA-C,as directed by  Mar Daring, PA-C while in the presence of Mar Daring, [place].  Established patient visit   Patient: Donna Malone   DOB: 1946-03-22   74 y.o. Female  MRN: [id number] Visit Date: 10/22/2019  Today's healthcare provider: Mar Daring, PA-C   Chief Complaint  Patient presents with  . Depression  . Hyperlipidemia  . Hypertension  . Hypothyroidism   Subjective    Arm Pain  Injury mechanism: Pt reports fracturing her right humerous over five years ago. The pain is present in the upper right arm. The quality of the pain is described as aching. Pertinent negatives include no chest pain, muscle weakness, numbness or tingling.    Diabetes Mellitus Type II, Follow-up  Lab Results  Component Value Date   HGBA1C 6.6 (A) 10/22/2019   HGBA1C 6.4 (A) 04/20/2019   HGBA1C 6.3 (H) 01/02/2019   Wt Readings from Last 3 Encounters:  10/22/19 193 lb (87.5 kg)  04/20/19 199 lb (90.3 kg)  04/20/19 199 lb 12.8 oz (90.6 kg)   Last seen for diabetes 6 months ago.  Management since then includes Continue Glipizide XR 63m. She reports excellent compliance with treatment. She is not having side effects.  Symptoms: Yes fatigue No foot ulcerations  Yes appetite changes No nausea  No paresthesia of the feet  No polydipsia  No polyuria No visual disturbances   No vomiting     Home blood sugar records: fasting range: 120-130's  Episodes of hypoglycemia? Yes occasionally at night.    Current insulin regiment: None Most Recent Eye Exam: UTD Current exercise: none Current diet habits: in general, a "healthy" diet    Pertinent Labs: Lab Results  Component Value Date   CHOL 172 04/20/2019   HDL 41 04/20/2019   LDLCALC 82 04/20/2019   TRIG 303 (H) 04/20/2019   CHOLHDL 4.2 04/20/2019   Lab Results   Component Value Date   NA 139 04/20/2019   K 3.7 04/20/2019   CREATININE 1.55 (H) 04/20/2019   GFRNONAA 33 (L) 04/20/2019   GFRAA 37 (L) 04/20/2019   GLUCOSE 127 (H) 04/20/2019     --------------------------------------------------------------------------------------------------- Hypothyroid, follow-up  Lab Results  Component Value Date   TSH 1.620 04/20/2019   TSH 0.908 01/02/2019   TSH 1.240 04/14/2018   Wt Readings from Last 3 Encounters:  10/22/19 193 lb (87.5 kg)  04/20/19 199 lb (90.3 kg)  04/20/19 199 lb 12.8 oz (90.6 kg)    She was last seen for hypothyroid 6 months ago.  Management since that visit includes Stable. Continue Levothyroxine 757m. She reports excellent compliance with treatment. She is not having side effects.   Symptoms: Yes change in energy level No constipation  Yes diarrhea Yes heat / cold intolerance  No nervousness No palpitations  No weight changes     PHQ9 SCORE ONLY 10/22/2019 04/20/2019 04/14/2018  PHQ-9 Total Score 18 16 11     ----------------------------------------------------------------------------------------- Patient Active Problem List   Diagnosis Date Noted  . Right arm pain 10/22/2019  . Degenerative arthritis of knee, bilateral 05/13/2018  . Pes planus 05/13/2018  . Bronchitis 09/19/2015  . Hypertension 04/01/2015  . Leukocytosis 12/22/2014  . Abnormal finding on mammography 09/21/2014  . Hypothyroidism 09/21/2014  . Obesity (BMI 30.0-34.9) 09/21/2014  . Chronic kidney disease, stage III (moderate) ([place]06/21/2016  . Type 2 diabetes  mellitus with stage 3b chronic kidney disease, without long-term current use of insulin ([place]) 09/21/2014  . Depressive disorder 09/21/2014  . Accumulation of fluid in tissues 09/21/2014  . Esophageal reflux 09/21/2014  . Gout 09/21/2014  . Hypercholesteremia 09/21/2014  . Insomnia 09/21/2014  . Vitamin D deficiency, unspecified 09/21/2014  . Low back pain 01/15/2014  . Other chest  pain 01/15/2014  . Atrophic vaginitis 01/15/2014   Past Medical History:  Diagnosis Date  . Asthma   . Bone spur    heel  . Depression   . Gout   . Hypertension    Social History   Tobacco Use  . Smoking status: Never Smoker  . Smokeless tobacco: Never Used  Vaping Use  . Vaping Use: Never used  Substance Use Topics  . Alcohol use: No  . Drug use: No   Allergies  Allergen Reactions  . Sulfa Antibiotics      Medications: Outpatient Medications Prior to Visit  Medication Sig  . Acetaminophen (EQ ARTHRITIS PAIN PO) Take 1 tablet by mouth 2 (two) times daily. As needed  . allopurinol (ZYLOPRIM) 100 MG tablet Take 1 tablet (100 mg total) by mouth daily.  Marland Kitchen atorvastatin (LIPITOR) 40 MG tablet TAKE 1 TABLET BY MOUTH EVERY DAY  . B Complex Vitamins (GNP VITAMIN B COMPLEX PO) Take by mouth daily.   . Blood Glucose Monitoring Suppl (ONETOUCH VERIO) w/Device KIT To check blood sugar once daily  . colchicine 0.6 MG tablet TAKE 1 TABLET BY MOUTH TWICE A DAY (Patient taking differently: TAKE 1 TABLET BY MOUTH TWICE A DAY as needed)  . escitalopram (LEXAPRO) 20 MG tablet TAKE 1 TABLET BY MOUTH EVERY DAY  . famotidine (PEPCID) 40 MG tablet TAKE 1 TABLET BY MOUTH EVERY DAY  . ferrous sulfate 325 (65 FE) MG EC tablet Take 325 mg by mouth daily.  . furosemide (LASIX) 40 MG tablet TAKE 1 TABLET BY MOUTH EVERY DAY  . glipiZIDE (GLUCOTROL XL) 10 MG 24 hr tablet TAKE 1 TABLET (10 MG TOTAL) BY MOUTH DAILY WITH BREAKFAST.  Marland Kitchen glucose blood (ONETOUCH VERIO) test strip To check blood sugar once daily  . Lancets (ONETOUCH ULTRASOFT) lancets To check blood sugar once daily  . levothyroxine (SYNTHROID) 75 MCG tablet TAKE 1 TABLET BY MOUTH EVERY DAY  . metFORMIN (GLUCOPHAGE) 1000 MG tablet Take 1 tablet (1,000 mg total) by mouth 2 (two) times daily with a meal.  . Vitamin D, Ergocalciferol, (DRISDOL) 1.25 MG (50000 UNIT) CAPS capsule TAKE 1 CAPSULE BY MOUTH ONE TIME PER WEEK  . zaleplon (SONATA) 10 MG  capsule Take 1 capsule (10 mg total) by mouth at bedtime as needed for sleep.  . [DISCONTINUED] magnesium 30 MG tablet Take 30 mg by mouth daily. Unsure of dose  . [DISCONTINUED] meloxicam (MOBIC) 15 MG tablet TAKE 1 TABLET BY MOUTH EVERY DAY WITH MEALS  . [DISCONTINUED] traMADol (ULTRAM) 50 MG tablet TAKE ONE TABLET EVERY SIX HOURS AS NEEDED FOR PAIN   No facility-administered medications prior to visit.    Review of Systems  Constitutional: Positive for activity change, appetite change and fatigue. Negative for chills, diaphoresis, fever and unexpected weight change.  Respiratory: Positive for shortness of breath. Negative for apnea, cough, choking, chest tightness, wheezing and stridor.   Cardiovascular: Negative.  Negative for chest pain.  Gastrointestinal: Positive for abdominal distention and diarrhea. Negative for abdominal pain, anal bleeding, blood in stool, constipation, nausea, rectal pain and vomiting.  Endocrine: Positive for heat intolerance. Negative for  cold intolerance, polydipsia, polyphagia and polyuria.  Skin: Negative for wound.  Neurological: Positive for weakness. Negative for dizziness, tingling, light-headedness, numbness and headaches.      Objective    BP 116/76 (BP Location: Left Arm, Patient Position: Sitting, Cuff Size: Large)   Pulse 83   Temp (!) 96.2 F (35.7 C)   Wt 193 lb (87.5 kg)   SpO2 97%   BMI 34.19 kg/m    Physical Exam Vitals and nursing note reviewed.  Constitutional:      General: She is not in acute distress.    Appearance: Normal appearance. She is obese. She is not ill-appearing.  HENT:     Head: Normocephalic and atraumatic.  Cardiovascular:     Rate and Rhythm: Normal rate and regular rhythm.     Pulses: Normal pulses.     Heart sounds: Normal heart sounds.  Pulmonary:     Effort: Pulmonary effort is normal.     Breath sounds: Normal breath sounds.  Musculoskeletal:     Right shoulder: Normal.     Left shoulder: Normal.       Right lower leg: No edema.     Left lower leg: No edema.  Skin:    General: Skin is warm and dry.     Capillary Refill: Capillary refill takes less than 2 seconds.  Neurological:     General: No focal deficit present.     Mental Status: She is alert and oriented to person, place, and time. Mental status is at baseline.  Psychiatric:        Mood and Affect: Mood normal. Affect is flat.        Behavior: Behavior normal.        Thought Content: Thought content normal.        Judgment: Judgment normal.       Results for orders placed or performed in visit on 10/22/19  CBC with Differential/Platelet  Result Value Ref Range   WBC 11.3 (H) 3.4 - 10.8 x10E3/uL   RBC 3.82 3.77 - 5.28 x10E6/uL   Hemoglobin 12.5 11.1 - 15.9 g/dL   Hematocrit 37.5 34.0 - 46.6 %   MCV 98 (H) 79 - 97 fL   MCH 32.7 26.6 - 33.0 pg   MCHC 33.3 31 - 35 g/dL   RDW 13.2 11.7 - 15.4 %   Platelets 335 150 - 450 x10E3/uL   Neutrophils 57 Not Estab. %   Lymphs 31 Not Estab. %   Monocytes 8 Not Estab. %   Eos 3 Not Estab. %   Basos 1 Not Estab. %   Neutrophils Absolute 6.6 1 - 7 x10E3/uL   Lymphocytes Absolute 3.5 (H) 0 - 3 x10E3/uL   Monocytes Absolute 0.9 0 - 0 x10E3/uL   EOS (ABSOLUTE) 0.3 0.0 - 0.4 x10E3/uL   Basophils Absolute 0.1 0 - 0 x10E3/uL   Immature Granulocytes 0 Not Estab. %   Immature Grans (Abs) 0.0 0.0 - 0.1 x10E3/uL  POCT glycosylated hemoglobin (Hb A1C)  Result Value Ref Range   Hemoglobin A1C 6.6 (A) 4.0 - 5.6 %    Assessment & Plan     Problem List Items Addressed This Visit      Cardiovascular and Mediastinum   Hypertension - Primary    Stable. No medications required. Continue DASH diet.        Endocrine   Hypothyroidism    Previously well controlled Continue Synthroid at current dose, 82mg  Recheck TSH and adjust Synthroid as indicated  Type 2 diabetes mellitus with stage 3b chronic kidney disease, without long-term current use of insulin (HCC)    A1C has  gone up slightly from 6.4 to 6.6 today.  A1C still remains at goal, not changes in medicines today.  Continue Glipizide XR 36m, Metformin 10034mBID Continue to work on lifestyle changes including diet and exercise.  Recheck in 6 months at CPE.        Relevant Orders   POCT glycosylated hemoglobin (Hb A1C) (Completed)     Other   Obesity (BMI 30.0-34.9)    Discussed importance of healthy weight management Discussed diet and exercise Congratulated pt on a recent 7 lb weight loss.         Depressive disorder    Chronic and uncontrolled. PHQ-9 score of 18 today was 16 in 04/2019 Discussed changing Lexapro to a different medicine. Pt would like to continue with current medication.   Recheck in 6 months but advised pt to call if she would like to change her treatment.       Hypercholesteremia    Previously well controlled Continue statin Repeat FLP and CMP Goal LDL < 70       Insomnia    Chronic and uncontrolled Has tried several medicines in the past, including Ambien, trazodone, and Sonata.    Discussed trying belsombra, but has declined at this time, but will call if she changes her mind.        Leukocytosis    Elevated at last check Will recheck today.      Relevant Orders   CBC with Differential/Platelet (Completed)   Right arm pain    Chronic issue, secondary to a humerus fracture over 5 years ago.  Discussed possible referral to physical therapy. Pt declines at this time.  Shoulder exercises provided.  Advised pt to call if worsening or does not improve.           Return in about 6 months (around 04/23/2020) for CPE and Wellness. . Reynolds BowlPA-C, have reviewed all documentation for this visit. The documentation on 10/27/19 for the exam, diagnosis, procedures, and orders are all accurate and complete.   JeRubye BeachBuProvidence Little Company Of Mary Mc - [phone]) [phone])  [place]

## 2019-10-22 NOTE — Assessment & Plan Note (Addendum)
Chronic and uncontrolled. PHQ-9 score of 18 today was 16 in 04/2019 Discussed changing Lexapro to a different medicine. Pt would like to continue with current medication.   Recheck in 6 months but advised pt to call if she would like to change her treatment.

## 2019-10-22 NOTE — Assessment & Plan Note (Addendum)
A1C has gone up slightly from 6.4 to 6.6 today.  A1C still remains at goal, not changes in medicines today.  Continue Glipizide XR 10mg , Metformin 1000mg  BID Continue to work on lifestyle changes including diet and exercise.  Recheck in 6 months at CPE.

## 2019-10-22 NOTE — Assessment & Plan Note (Addendum)
Chronic issue, secondary to a humerus fracture over 5 years ago.  Discussed possible referral to physical therapy. Pt declines at this time.  Shoulder exercises provided.  Advised pt to call if worsening or does not improve.

## 2019-10-22 NOTE — Assessment & Plan Note (Signed)
>>  ASSESSMENT AND PLAN FOR DEPRESSIVE DISORDER WRITTEN ON 10/22/2019  2:09 PM BY WALSH, LAURA E, CMA  Chronic and uncontrolled. PHQ-9 score of 18 today was 16 in 04/2019 Discussed changing Lexapro to a different medicine. Pt would like to continue with current medication.   Recheck in 6 months but advised pt to call if she would like to change her treatment.

## 2019-10-22 NOTE — Assessment & Plan Note (Signed)
Previously well controlled Continue statin Repeat FLP and CMP Goal LDL < 70 

## 2019-10-22 NOTE — Patient Instructions (Addendum)
Try OTC Diclofenac gel for your arm pain.    Shoulder Range of Motion Exercises Shoulder range of motion (ROM) exercises are done to keep the shoulder moving freely or to increase movement. They are often recommended for people who have shoulder pain or stiffness or who are recovering from a shoulder surgery. Phase 1 exercises When you are able, do this exercise 1-2 times per day for 30-60 seconds in each direction, or as directed by your health care provider. Pendulum exercise To do this exercise while sitting: 1. Sit in a chair or at the edge of your bed with your feet flat on the floor. 2. Let your affected arm hang down in front of you over the edge of the bed or chair. 3. Relax your shoulder, arm, and hand. [address] your body so your arm gently swings in small circles. You can also use your unaffected arm to start the motion. 5. Repeat changing the direction of the circles, swinging your arm left and right, and swinging your arm forward and back. To do this exercise while standing: 1. Stand next to a sturdy chair or table, and hold on to it with your hand on your unaffected side. 2. Bend forward at the waist. 3. Bend your knees slightly. 4. Relax your shoulder, arm, and hand. 5. While keeping your shoulder relaxed, use body motion to swing your arm in small circles. 6. Repeat changing the direction of the circles, swinging your arm left and right, and swinging your arm forward and back. 7. Between exercises, stand up tall and take a short break to relax your lower back.  Phase 2 exercises Do these exercises 1-2 times per day or as told by your health care provider. Hold each stretch for 30 seconds, and repeat 3 times. Do the exercises with one or both arms as instructed by your health care provider. For these exercises, sit at a table with your hand and arm supported by the table. A chair that slides easily or has wheels can be helpful. External rotation 1. Turn your chair so that  your affected side is nearest to the table. 2. Place your forearm on the table to your side. Bend your elbow about 90 at the elbow (right angle) and place your hand palm facing down on the table. Your elbow should be about 6 inches away from your side. 3. Keeping your arm on the table, lean your body forward. Abduction 1. Turn your chair so that your affected side is nearest to the table. 2. Place your forearm and hand on the table so that your thumb points toward the ceiling and your arm is straight out to your side. 3. Slide your hand out to the side and away from you, using your unaffected arm to do the work. 4. To increase the stretch, you can slide your chair away from the table. Flexion: forward stretch 1. Sit facing the table. Place your hand and elbow on the table in front of you. 2. Slide your hand forward and away from you, using your unaffected arm to do the work. 3. To increase the stretch, you can slide your chair backward. Phase 3 exercises Do these exercises 1-2 times per day or as told by your health care provider. Hold each stretch for 30 seconds, and repeat 3 times. Do the exercises with one or both arms as instructed by your health care provider. Cross-body stretch: posterior capsule stretch 1. Lift your arm straight out in front of you. 2. [place]  your arm 90 at the elbow (right angle) so your forearm moves across your body. 3. Use your other arm to gently pull the elbow across your body, toward your other shoulder. Wall climbs 1. Stand with your affected arm extended out to the side with your hand resting on a door frame. 2. Slide your hand slowly up the door frame. 3. To increase the stretch, step through the door frame. Keep your body upright and do not lean. Wand exercises You will need a cane, a piece of PVC pipe, or a sturdy wooden dowel for wand exercises. Flexion To do this exercise while standing: 1. Hold the wand with both of your hands, palms down. 2. Using  the other arm to help, lift your arms up and over your head, if able. 3. Push upward with your other arm to gently increase the stretch. To do this exercise while lying down: 1. Lie on your back with your elbows resting on the floor and the wand in both your hands. Your hands will be palm down, or pointing toward your feet. 2. Lift your hands toward the ceiling, using your unaffected arm to help if needed. 3. Bring your arms overhead as able, using your unaffected arm to help if needed. Internal rotation 1. Stand while holding the wand behind you with both hands. Your unaffected arm should be extended above your head with the arm of the affected side extended behind you at the level of your waist. The wand should be pointing straight up and down as you hold it. 2. Slowly pull the wand up behind your back by straightening the elbow of your unaffected arm and bending the elbow of your affected arm. External rotation 1. Lie on your back with your affected upper arm supported on a small pillow or rolled towel. When you first do this exercise, keep your upper arm close to your body. Over time, bring your arm up to a 90 angle out to the side. 2. Hold the wand across your stomach and with both hands palm up. Your elbow on your affected side should be bent at a 90 angle. 3. Use your unaffected side to help push your forearm away from you and toward the floor. Keep your elbow on your affected side bent at a 90 angle. Contact a health care provider if you have:  New or increasing pain.  New numbness, tingling, weakness, or discoloration in your arm or hand. This information is not intended to replace advice given to you by your health care provider. Make sure you discuss any questions you have with your health care provider. Document Revised: 05/01/2017 Document Reviewed: 05/01/2017 Elsevier Patient Education  2020 [employer].

## 2019-10-22 NOTE — Assessment & Plan Note (Signed)
Elevated at last check Will recheck today.

## 2019-10-22 NOTE — Assessment & Plan Note (Signed)
Chronic and uncontrolled Has tried several medicines in the past, including Ambien, trazodone, and Sonata.    Discussed trying belsombra, but has declined at this time, but will call if she changes her mind.

## 2019-10-22 NOTE — Assessment & Plan Note (Addendum)
Discussed importance of healthy weight management Discussed diet and exercise Congratulated pt on a recent 7 lb weight loss.

## 2019-10-23 ENCOUNTER — Telehealth: Payer: Self-pay

## 2019-10-23 DIAGNOSIS — D72829 Elevated white blood cell count, unspecified: Secondary | ICD-10-CM

## 2019-10-23 LAB — CBC WITH DIFFERENTIAL/PLATELET
Basophils Absolute: 0.1 10*3/uL (ref 0.0–0.2)
Basos: 1 %
EOS (ABSOLUTE): 0.3 10*3/uL (ref 0.0–0.4)
Eos: 3 %
Hematocrit: 37.5 % (ref 34.0–46.6)
Hemoglobin: 12.5 g/dL (ref 11.1–15.9)
Immature Grans (Abs): 0 10*3/uL (ref 0.0–0.1)
Immature Granulocytes: 0 %
Lymphocytes Absolute: 3.5 10*3/uL — ABNORMAL HIGH (ref 0.7–3.1)
Lymphs: 31 %
MCH: 32.7 pg (ref 26.6–33.0)
MCHC: 33.3 g/dL (ref 31.5–35.7)
MCV: 98 fL — ABNORMAL HIGH (ref 79–97)
Monocytes Absolute: 0.9 10*3/uL (ref 0.1–0.9)
Monocytes: 8 %
Neutrophils Absolute: 6.6 10*3/uL (ref 1.4–7.0)
Neutrophils: 57 %
Platelets: 335 10*3/uL (ref 150–450)
RBC: 3.82 x10E6/uL (ref 3.77–5.28)
RDW: 13.2 % (ref 11.7–15.4)
WBC: 11.3 10*3/uL — ABNORMAL HIGH (ref 3.4–10.8)

## 2019-10-23 NOTE — Telephone Encounter (Signed)
-----   Message from Mar Daring, [place] sent at 10/23/2019 12:45 PM EDT ----- WBC count is stable and essentially unchanged but remains elevated. Would you prefer to see hematology to rule out any abnormal reason for this?

## 2019-10-23 NOTE — Telephone Encounter (Signed)
Patient advised and verbalized understanding. Patient would like to proceed with hematology referral. Please schedule.

## 2019-10-27 ENCOUNTER — Encounter: Payer: Self-pay | Admitting: Physician Assistant

## 2019-10-27 NOTE — Assessment & Plan Note (Signed)
Stable. No medications required. Continue DASH diet.

## 2019-11-04 ENCOUNTER — Other Ambulatory Visit: Payer: Self-pay

## 2019-11-04 ENCOUNTER — Inpatient Hospital Stay: Payer: PPO

## 2019-11-04 ENCOUNTER — Inpatient Hospital Stay: Payer: PPO | Attending: Oncology | Admitting: Oncology

## 2019-11-04 ENCOUNTER — Encounter: Payer: Self-pay | Admitting: Oncology

## 2019-11-04 VITALS — BP 138/83 | HR 75 | Temp 96.6°F | Wt 194.6 lb

## 2019-11-04 DIAGNOSIS — D72829 Elevated white blood cell count, unspecified: Secondary | ICD-10-CM

## 2019-11-04 DIAGNOSIS — N1832 Chronic kidney disease, stage 3b: Secondary | ICD-10-CM | POA: Insufficient documentation

## 2019-11-04 DIAGNOSIS — Z7984 Long term (current) use of oral hypoglycemic drugs: Secondary | ICD-10-CM | POA: Insufficient documentation

## 2019-11-04 DIAGNOSIS — F329 Major depressive disorder, single episode, unspecified: Secondary | ICD-10-CM | POA: Insufficient documentation

## 2019-11-04 DIAGNOSIS — Z79899 Other long term (current) drug therapy: Secondary | ICD-10-CM | POA: Diagnosis not present

## 2019-11-04 DIAGNOSIS — Z833 Family history of diabetes mellitus: Secondary | ICD-10-CM | POA: Diagnosis not present

## 2019-11-04 DIAGNOSIS — J45909 Unspecified asthma, uncomplicated: Secondary | ICD-10-CM | POA: Diagnosis not present

## 2019-11-04 DIAGNOSIS — Z90710 Acquired absence of both cervix and uterus: Secondary | ICD-10-CM | POA: Diagnosis not present

## 2019-11-04 DIAGNOSIS — Z8249 Family history of ischemic heart disease and other diseases of the circulatory system: Secondary | ICD-10-CM | POA: Insufficient documentation

## 2019-11-04 DIAGNOSIS — D72820 Lymphocytosis (symptomatic): Secondary | ICD-10-CM | POA: Insufficient documentation

## 2019-11-04 DIAGNOSIS — M109 Gout, unspecified: Secondary | ICD-10-CM | POA: Diagnosis not present

## 2019-11-04 DIAGNOSIS — I10 Essential (primary) hypertension: Secondary | ICD-10-CM | POA: Diagnosis not present

## 2019-11-04 LAB — TECHNOLOGIST SMEAR REVIEW: Plt Morphology: NORMAL

## 2019-11-04 LAB — CBC WITH DIFFERENTIAL/PLATELET
Abs Immature Granulocytes: 0.04 10*3/uL (ref 0.00–0.07)
Basophils Absolute: 0.1 10*3/uL (ref 0.0–0.1)
Basophils Relative: 1 %
Eosinophils Absolute: 0.5 10*3/uL (ref 0.0–0.5)
Eosinophils Relative: 5 %
HCT: 36.3 % (ref 36.0–46.0)
Hemoglobin: 12.4 g/dL (ref 12.0–15.0)
Immature Granulocytes: 0 %
Lymphocytes Relative: 32 %
Lymphs Abs: 3.4 10*3/uL (ref 0.7–4.0)
MCH: 33.2 pg (ref 26.0–34.0)
MCHC: 34.2 g/dL (ref 30.0–36.0)
MCV: 97.3 fL (ref 80.0–100.0)
Monocytes Absolute: 0.8 10*3/uL (ref 0.1–1.0)
Monocytes Relative: 8 %
Neutro Abs: 5.9 10*3/uL (ref 1.7–7.7)
Neutrophils Relative %: 54 %
Platelets: 304 10*3/uL (ref 150–400)
RBC: 3.73 MIL/uL — ABNORMAL LOW (ref 3.87–5.11)
RDW: 13.2 % (ref 11.5–15.5)
WBC: 10.7 10*3/uL — ABNORMAL HIGH (ref 4.0–10.5)
nRBC: 0 % (ref 0.0–0.2)

## 2019-11-04 LAB — COMPREHENSIVE METABOLIC PANEL
ALT: 14 U/L (ref 0–44)
AST: 23 U/L (ref 15–41)
Albumin: 3.8 g/dL (ref 3.5–5.0)
Alkaline Phosphatase: 51 U/L (ref 38–126)
Anion gap: 13 (ref 5–15)
BUN: 28 mg/dL — ABNORMAL HIGH (ref 8–23)
CO2: 23 mmol/L (ref 22–32)
Calcium: 9.3 mg/dL (ref 8.9–10.3)
Chloride: 105 mmol/L (ref 98–111)
Creatinine, Ser: 1.89 mg/dL — ABNORMAL HIGH (ref 0.44–1.00)
GFR calc Af Amer: 29 mL/min — ABNORMAL LOW (ref >60)
GFR calc non Af Amer: 25 mL/min — ABNORMAL LOW (ref >60)
Glucose, Bld: 129 mg/dL — ABNORMAL HIGH (ref 70–99)
Potassium: 3.9 mmol/L (ref 3.5–5.1)
Sodium: 141 mmol/L (ref 135–145)
Total Bilirubin: 0.7 mg/dL (ref 0.3–1.2)
Total Protein: 7.3 g/dL (ref 6.5–8.1)

## 2019-11-04 LAB — HEPATITIS PANEL, ACUTE
HCV Ab: NONREACTIVE
Hep A IgM: NONREACTIVE
Hep B C IgM: NONREACTIVE
Hepatitis B Surface Ag: NONREACTIVE

## 2019-11-04 LAB — VITAMIN B12: Vitamin B-12: 1022 pg/mL — ABNORMAL HIGH (ref 180–914)

## 2019-11-04 LAB — LACTATE DEHYDROGENASE: LDH: 95 U/L — ABNORMAL LOW (ref 98–192)

## 2019-11-04 LAB — FOLATE: Folate: 14.3 ng/mL (ref >5.9)

## 2019-11-04 NOTE — Progress Notes (Signed)
Hematology/Oncology Consult note [facility] [phone] [phone]   Patient Care Team: Rubye Beach as PCP - General (Family Medicine) Lorelee Cover., MD as Consulting Physician (Ophthalmology) Lovell Sheehan, MD as Consulting Physician (Orthopedic Surgery)  REFERRING PROVIDER: Mar Daring, PA-C  CHIEF COMPLAINTS/REASON FOR VISIT:  Evaluation of leukocytosis  HISTORY OF PRESENTING ILLNESS:  Donna Malone is a  74 y.o.  female with PMH listed below who was referred to me for evaluation of leukocytosis Reviewed patient' recent labs obtained by PCP.   10/22/2019 CBC showed elevated white count of 11.3, predominantly lymphocytosis.  Normal hemoglobin and platelet count. Previous lab records reviewed. Leukocytosis onset of chronic, duration is since January 2021.   No aggravating or elevated factors. Associated symptoms or signs:  Denies weight loss, fever, chills,night sweats.  Endorses fatigue Smoking history: Never smoker History of recent oral steroid use or steroid injection: No recent steroid injections. History of recent infection: Denies Autoimmune disease history.  Denies  Patient denies any chronic wound, prosthesis.  She has chronic knee arthritis.  Review of Systems  Constitutional: Positive for fatigue. Negative for appetite change, chills and fever.  HENT:   Negative for hearing loss and voice change.   Eyes: Negative for eye problems.  Respiratory: Negative for chest tightness and cough.   Cardiovascular: Negative for chest pain.  Gastrointestinal: Negative for abdominal distention, abdominal pain and blood in stool.  Endocrine: Negative for hot flashes.  Genitourinary: Negative for difficulty urinating and frequency.   Musculoskeletal: Positive for arthralgias.  Skin: Negative for itching and rash.  Neurological: Negative for extremity weakness.  Hematological: Negative for adenopathy.    Psychiatric/Behavioral: Negative for confusion.     MEDICAL HISTORY:  Past Medical History:  Diagnosis Date  . Asthma   . Bone spur    heel  . Depression   . Gout   . Hypertension     SURGICAL HISTORY: Past Surgical History:  Procedure Laterality Date  . ABDOMINAL HYSTERECTOMY  04/1970   partial  . CHOLECYSTECTOMY  late 1990's    SOCIAL HISTORY: Social History   Socioeconomic History  . Marital status: Married    Spouse name: [name]  . Number of children: 3  . Years of education: Not on file  . Highest education level: 12th grade  Occupational History  . Occupation: retired  Tobacco Use  . Smoking status: Never Smoker  . Smokeless tobacco: Never Used  Vaping Use  . Vaping Use: Never used  Substance and Sexual Activity  . Alcohol use: No  . Drug use: No  . Sexual activity: Not on file  Other Topics Concern  . Not on file  Social History Narrative  . Not on file   Social Determinants of Health   Financial Resource Strain: Low Risk   . Difficulty of Paying Living Expenses: Not hard at all  Food Insecurity: No Food Insecurity  . Worried About Charity fundraiser in the Last Year: Never true  . Ran Out of Food in the Last Year: Never true  Transportation Needs: No Transportation Needs  . Lack of Transportation (Medical): No  . Lack of Transportation (Non-Medical): No  Physical Activity: Inactive  . Days of Exercise per Week: 0 days  . Minutes of Exercise per Session: 0 min  Stress: No Stress Concern Present  . Feeling of Stress : Only a little  Social Connections: Moderately Integrated  . Frequency of Communication with Friends and Family: More  than three times a week  . Frequency of Social Gatherings with Friends and Family: Twice a week  . Attends Religious Services: More than 4 times per year  . Active Member of Clubs or Organizations: No  . Attends Archivist Meetings: Never  . Marital Status: Married  Human resources officer Violence: Not At  Risk  . Fear of Current or Ex-Partner: No  . Emotionally Abused: No  . Physically Abused: No  . Sexually Abused: No    FAMILY HISTORY: Family History  Problem Relation Age of Onset  . CAD Mother   . Heart attack Mother   . Heart disease Father   . Diabetes Cousin   . Cancer Cousin     ALLERGIES:  is allergic to sulfa antibiotics.  MEDICATIONS:  Current Outpatient Medications  Medication Sig Dispense Refill  . Acetaminophen (EQ ARTHRITIS PAIN PO) Take 1 tablet by mouth 2 (two) times daily. As needed    . allopurinol (ZYLOPRIM) 100 MG tablet Take 1 tablet (100 mg total) by mouth daily. 90 tablet 1  . atorvastatin (LIPITOR) 40 MG tablet TAKE 1 TABLET BY MOUTH EVERY DAY 90 tablet 3  . B Complex Vitamins (GNP VITAMIN B COMPLEX PO) Take by mouth daily.     . Blood Glucose Monitoring Suppl (ONETOUCH VERIO) w/Device KIT To check blood sugar once daily 1 kit 0  . colchicine 0.6 MG tablet TAKE 1 TABLET BY MOUTH TWICE A DAY (Patient taking differently: TAKE 1 TABLET BY MOUTH TWICE A DAY as needed) 60 tablet 5  . escitalopram (LEXAPRO) 20 MG tablet TAKE 1 TABLET BY MOUTH EVERY DAY 90 tablet 3  . famotidine (PEPCID) 40 MG tablet TAKE 1 TABLET BY MOUTH EVERY DAY 90 tablet 2  . ferrous sulfate 325 (65 FE) MG EC tablet Take 325 mg by mouth daily.    . furosemide (LASIX) 40 MG tablet TAKE 1 TABLET BY MOUTH EVERY DAY 90 tablet 1  . glipiZIDE (GLUCOTROL XL) 10 MG 24 hr tablet TAKE 1 TABLET (10 MG TOTAL) BY MOUTH DAILY WITH BREAKFAST. 90 tablet 1  . glucose blood (ONETOUCH VERIO) test strip To check blood sugar once daily 100 each 12  . Lancets (ONETOUCH ULTRASOFT) lancets To check blood sugar once daily 100 each 12  . levothyroxine (SYNTHROID) 75 MCG tablet TAKE 1 TABLET BY MOUTH EVERY DAY 90 tablet 3  . metFORMIN (GLUCOPHAGE) 1000 MG tablet Take 1 tablet (1,000 mg total) by mouth 2 (two) times daily with a meal. 180 tablet 3  . Misc Natural Products (TART CHERRY ADVANCED PO) Take 1 tablet by mouth  daily.    . TURMERIC PO Take 1 capsule by mouth daily.    . Vitamin D, Ergocalciferol, (DRISDOL) 1.25 MG (50000 UNIT) CAPS capsule TAKE 1 CAPSULE BY MOUTH ONE TIME PER WEEK 12 capsule 1  . zaleplon (SONATA) 10 MG capsule Take 1 capsule (10 mg total) by mouth at bedtime as needed for sleep. 90 capsule 1   No current facility-administered medications for this visit.     PHYSICAL EXAMINATION: ECOG PERFORMANCE STATUS: 1 - Symptomatic but completely ambulatory Vitals:   11/04/19 0957  BP: 138/83  Pulse: 75  Temp: (!) 96.6 F (35.9 C)  SpO2: 97%   Filed Weights   11/04/19 0957  Weight: 194 lb 9.6 oz (88.3 kg)    Physical Exam Constitutional:      General: She is not in acute distress.    Comments: Patient walks independently  HENT:  Head: Normocephalic and atraumatic.  Eyes:     General: No scleral icterus. Cardiovascular:     Rate and Rhythm: Normal rate and regular rhythm.     Heart sounds: Normal heart sounds.  Pulmonary:     Effort: Pulmonary effort is normal. No respiratory distress.     Breath sounds: No wheezing.  Abdominal:     General: Bowel sounds are normal. There is no distension.     Palpations: Abdomen is soft.  Musculoskeletal:        General: No deformity. Normal range of motion.     Cervical back: Normal range of motion and neck supple.  Skin:    General: Skin is warm and dry.     Findings: No erythema or rash.  Neurological:     Mental Status: She is alert and oriented to person, place, and time. Mental status is at baseline.     Cranial Nerves: No cranial nerve deficit.     Coordination: Coordination normal.  Psychiatric:        Mood and Affect: Mood normal.     CMP Latest Ref Rng & Units 11/04/2019  Glucose 70 - 99 mg/dL 129(H)  BUN 8 - 23 mg/dL 28(H)  Creatinine 0.44 - 1.00 mg/dL 1.89(H)  Sodium 135 - 145 mmol/L 141  Potassium 3.5 - 5.1 mmol/L 3.9  Chloride 98 - 111 mmol/L 105  CO2 22 - 32 mmol/L 23  Calcium 8.9 - 10.3 mg/dL 9.3  Total  Protein 6.5 - 8.1 g/dL 7.3  Total Bilirubin 0.3 - 1.2 mg/dL 0.7  Alkaline Phos 38 - 126 U/L 51  AST 15 - 41 U/L 23  ALT 0 - 44 U/L 14   CBC Latest Ref Rng & Units 11/04/2019  WBC 4.0 - 10.5 K/uL 10.7(H)  Hemoglobin 12.0 - 15.0 g/dL 12.4  Hematocrit 36 - 46 % 36.3  Platelets 150 - 400 K/uL 304     RADIOGRAPHIC STUDIES: I have personally reviewed the radiological images as listed and agreed with the findings in the report. No results found.  LABORATORY DATA:  I have reviewed the data as listed Lab Results  Component Value Date   WBC 10.7 (H) 11/04/2019   HGB 12.4 11/04/2019   HCT 36.3 11/04/2019   MCV 97.3 11/04/2019   PLT 304 11/04/2019   Recent Labs    01/02/19 1130 04/20/19 1122 11/04/19 1027  NA 138 139 141  K 3.7 3.7 3.9  CL 99 102 105  CO2 19* 22 23  GLUCOSE 144* 127* 129*  BUN 26 15 28*  CREATININE 1.65* 1.55* 1.89*  CALCIUM 10.3 9.2 9.3  GFRNONAA 30* 33* 25*  GFRAA 35* 37* 29*  PROT 6.7 6.4 7.3  ALBUMIN 4.0 3.9 3.8  AST 45* 20 23  ALT 34* 16 14  ALKPHOS 71 65 51  BILITOT 0.8 0.4 0.7   Iron/TIBC/Ferritin/ %Sat    Component Value Date/Time   IRON 56 04/23/2017 1644   TIBC 362 04/23/2017 1644   FERRITIN 19 04/23/2017 1644   IRONPCTSAT 15 04/23/2017 1644        ASSESSMENT & PLAN:  1. Leukocytosis, unspecified type   2. Stage 3b chronic kidney disease    Labs reviewed and discussed with patient that Leukocytosis, predominantly neutrophilia, can be secondary to infection, chronic inflammation,autoimmune disease, or underlying bone marrow disorders.   For the work up of patient's leukocytosis, I recommend checking CBC;CMP, LDH, hepatitis, B12, folate, smear review, flowcytometry, etc.  Chronic kidney disease, creatinine was 1.89,  slightly worse than her baseline.  Avoid nephrotoxin.   Orders Placed This Encounter  Procedures  . Hepatitis panel, acute    Standing Status:   Future    Number of Occurrences:   1    Standing Expiration Date:    11/03/2020  . Lactate dehydrogenase    Standing Status:   Future    Number of Occurrences:   1    Standing Expiration Date:   11/03/2020  . CBC with Differential/Platelet    Standing Status:   Future    Number of Occurrences:   1    Standing Expiration Date:   11/03/2020  . Technologist smear review    Standing Status:   Future    Number of Occurrences:   1    Standing Expiration Date:   11/03/2020  . Flow cytometry panel-leukemia/lymphoma work-up  . Vitamin B12    Standing Status:   Future    Number of Occurrences:   1    Standing Expiration Date:   11/03/2020  . Folate    Standing Status:   Future    Number of Occurrences:   1    Standing Expiration Date:   11/03/2020  . Comprehensive metabolic panel    Standing Status:   Future    Number of Occurrences:   1    Standing Expiration Date:   11/03/2020    All questions were answered. The patient knows to call the clinic with any problems questions or concerns.  Return of visit: 2 weeks to discuss labs. Thank you for this kind referral and the opportunity to participate in the care of this patient. A copy of today's note is routed to referring provider   Earlie Server, MD, PhD Hematology Oncology [facility] Pager- [phone] 11/04/2019

## 2019-11-04 NOTE — Progress Notes (Signed)
New patient evaluation for abnormal labs.

## 2019-11-09 DIAGNOSIS — E113393 Type 2 diabetes mellitus with moderate nonproliferative diabetic retinopathy without macular edema, bilateral: Secondary | ICD-10-CM | POA: Diagnosis not present

## 2019-11-09 LAB — COMP PANEL: LEUKEMIA/LYMPHOMA: Immunophenotypic Profile: 0

## 2019-11-26 ENCOUNTER — Inpatient Hospital Stay (HOSPITAL_BASED_OUTPATIENT_CLINIC_OR_DEPARTMENT_OTHER): Payer: PPO | Admitting: Oncology

## 2019-11-26 ENCOUNTER — Encounter: Payer: Self-pay | Admitting: Oncology

## 2019-11-26 ENCOUNTER — Other Ambulatory Visit: Payer: Self-pay

## 2019-11-26 VITALS — BP 111/76 | HR 77 | Temp 96.8°F | Resp 16 | Wt 191.1 lb

## 2019-11-26 DIAGNOSIS — D72820 Lymphocytosis (symptomatic): Secondary | ICD-10-CM | POA: Diagnosis not present

## 2019-11-26 NOTE — Progress Notes (Signed)
Patient denies new problems/concerns today.   °

## 2019-11-26 NOTE — Progress Notes (Signed)
Hematology/Oncology Consult note Donna Malone [phone] [phone]   Patient Care Team: Rubye Beach as PCP - General (Family Medicine) Lorelee Cover., MD as Consulting Physician (Ophthalmology) Lovell Sheehan, MD as Consulting Physician (Orthopedic Surgery)  REFERRING PROVIDER: Mar Daring, PA-C  CHIEF COMPLAINTS/REASON FOR VISIT:  Evaluation of leukocytosis  HISTORY OF PRESENTING ILLNESS:  Donna Malone is a  74 y.o.  female with PMH listed below who was referred to me for evaluation of leukocytosis Reviewed patient' recent labs obtained by PCP.   10/22/2019 CBC showed elevated white count of 11.3, predominantly lymphocytosis.  Normal hemoglobin and platelet count. Previous lab records reviewed. Leukocytosis onset of chronic, duration is since January 2021.   No aggravating or elevated factors. Associated symptoms or signs:  Denies weight loss, fever, chills,night sweats.  Endorses fatigue Smoking history: Never smoker History of recent oral steroid use or steroid injection: No recent steroid injections. History of recent infection: Denies Autoimmune disease history.  Denies  Patient denies any chronic wound, prosthesis.  She has chronic knee arthritis.   INTERVAL HISTORY Donna Malone is a 73 y.o. female who has above history reviewed by me today presents for follow up visit for for leukocytosis Problems and complaints are listed below: Patient has no new complaint.  Present to discuss work-up results. Review of Systems  Constitutional: Positive for fatigue. Negative for appetite change, chills and fever.  HENT:   Negative for hearing loss and voice change.   Eyes: Negative for eye problems.  Respiratory: Negative for chest tightness and cough.   Cardiovascular: Negative for chest pain.  Gastrointestinal: Negative for abdominal distention, abdominal pain and blood in stool.  Endocrine:  Negative for hot flashes.  Genitourinary: Negative for difficulty urinating and frequency.   Musculoskeletal: Positive for arthralgias.  Skin: Negative for itching and rash.  Neurological: Negative for extremity weakness.  Hematological: Negative for adenopathy.  Psychiatric/Behavioral: Negative for confusion.     MEDICAL HISTORY:  Past Medical History:  Diagnosis Date  . Asthma   . Bone spur    heel  . Depression   . Gout   . Hypertension     SURGICAL HISTORY: Past Surgical History:  Procedure Laterality Date  . ABDOMINAL HYSTERECTOMY  04/1970   partial  . CHOLECYSTECTOMY  late 1990's    SOCIAL HISTORY: Social History   Socioeconomic History  . Marital status: Married    Spouse name: [name]  . Number of children: 3  . Years of education: Not on file  . Highest education level: 12th grade  Occupational History  . Occupation: retired  Tobacco Use  . Smoking status: Never Smoker  . Smokeless tobacco: Never Used  Vaping Use  . Vaping Use: Never used  Substance and Sexual Activity  . Alcohol use: No  . Drug use: No  . Sexual activity: Not on file  Other Topics Concern  . Not on file  Social History Narrative  . Not on file   Social Determinants of Health   Financial Resource Strain: Low Risk   . Difficulty of Paying Living Expenses: Not hard at all  Food Insecurity: No Food Insecurity  . Worried About Charity fundraiser in the Last Year: Never true  . Ran Out of Food in the Last Year: Never true  Transportation Needs: No Transportation Needs  . Lack of Transportation (Medical): No  . Lack of Transportation (Non-Medical): No  Physical Activity: Inactive  . Days of Exercise  per Week: 0 days  . Minutes of Exercise per Session: 0 min  Stress: No Stress Concern Present  . Feeling of Stress : Only a little  Social Connections: Moderately Integrated  . Frequency of Communication with Friends and Family: More than three times a week  . Frequency of Social  Gatherings with Friends and Family: Twice a week  . Attends Religious Services: More than 4 times per year  . Active Member of Clubs or Organizations: No  . Attends Archivist Meetings: Never  . Marital Status: Married  Human resources officer Violence: Not At Risk  . Fear of Current or Ex-Partner: No  . Emotionally Abused: No  . Physically Abused: No  . Sexually Abused: No    FAMILY HISTORY: Family History  Problem Relation Age of Onset  . CAD Mother   . Heart attack Mother   . Heart disease Father   . Diabetes Cousin   . Cancer Cousin     ALLERGIES:  is allergic to sulfa antibiotics.  MEDICATIONS:  Current Outpatient Medications  Medication Sig Dispense Refill  . Acetaminophen (EQ ARTHRITIS PAIN PO) Take 1 tablet by mouth 2 (two) times daily. As needed    . allopurinol (ZYLOPRIM) 100 MG tablet Take 1 tablet (100 mg total) by mouth daily. 90 tablet 1  . atorvastatin (LIPITOR) 40 MG tablet TAKE 1 TABLET BY MOUTH EVERY DAY 90 tablet 3  . B Complex Vitamins (GNP VITAMIN B COMPLEX PO) Take by mouth daily.     . Blood Glucose Monitoring Suppl (ONETOUCH VERIO) w/Device KIT To check blood sugar once daily 1 kit 0  . colchicine 0.6 MG tablet TAKE 1 TABLET BY MOUTH TWICE A DAY (Patient taking differently: TAKE 1 TABLET BY MOUTH TWICE A DAY as needed) 60 tablet 5  . escitalopram (LEXAPRO) 20 MG tablet TAKE 1 TABLET BY MOUTH EVERY DAY 90 tablet 3  . famotidine (PEPCID) 40 MG tablet TAKE 1 TABLET BY MOUTH EVERY DAY 90 tablet 2  . ferrous sulfate 325 (65 FE) MG EC tablet Take 325 mg by mouth daily.    . furosemide (LASIX) 40 MG tablet TAKE 1 TABLET BY MOUTH EVERY DAY 90 tablet 1  . glipiZIDE (GLUCOTROL XL) 10 MG 24 hr tablet TAKE 1 TABLET (10 MG TOTAL) BY MOUTH DAILY WITH BREAKFAST. 90 tablet 1  . glucose blood (ONETOUCH VERIO) test strip To check blood sugar once daily 100 each 12  . Lancets (ONETOUCH ULTRASOFT) lancets To check blood sugar once daily 100 each 12  . levothyroxine  (SYNTHROID) 75 MCG tablet TAKE 1 TABLET BY MOUTH EVERY DAY 90 tablet 3  . metFORMIN (GLUCOPHAGE) 1000 MG tablet Take 1 tablet (1,000 mg total) by mouth 2 (two) times daily with a meal. 180 tablet 3  . Misc Natural Products (TART CHERRY ADVANCED PO) Take 1 tablet by mouth daily.    Vladimir Faster Glycol-Propyl Glycol (SYSTANE HYDRATION PF OP) Apply to eye.    . TURMERIC PO Take 1 capsule by mouth daily.    . Vitamin D, Ergocalciferol, (DRISDOL) 1.25 MG (50000 UNIT) CAPS capsule TAKE 1 CAPSULE BY MOUTH ONE TIME PER WEEK 12 capsule 1  . zaleplon (SONATA) 10 MG capsule Take 1 capsule (10 mg total) by mouth at bedtime as needed for sleep. (Patient not taking: Reported on 11/26/2019) 90 capsule 1   No current facility-administered medications for this visit.     PHYSICAL EXAMINATION: ECOG PERFORMANCE STATUS: 1 - Symptomatic but completely ambulatory Vitals:  11/26/19 1112  BP: 111/76  Pulse: 77  Resp: 16  Temp: (!) 96.8 F (36 C)   Filed Weights   11/26/19 1112  Weight: 191 lb 1.6 oz (86.7 kg)    Physical Exam Constitutional:      General: She is not in acute distress.    Comments: Patient walks independently  HENT:     Head: Normocephalic and atraumatic.  Eyes:     General: No scleral icterus. Cardiovascular:     Rate and Rhythm: Normal rate and regular rhythm.     Heart sounds: Normal heart sounds.  Pulmonary:     Effort: Pulmonary effort is normal. No respiratory distress.     Breath sounds: No wheezing.  Abdominal:     General: Bowel sounds are normal. There is no distension.     Palpations: Abdomen is soft.  Musculoskeletal:        General: No deformity. Normal range of motion.     Cervical back: Normal range of motion and neck supple.  Skin:    General: Skin is warm and dry.     Findings: No erythema or rash.  Neurological:     Mental Status: She is alert and oriented to person, place, and time. Mental status is at baseline.     Cranial Nerves: No cranial nerve  deficit.     Coordination: Coordination normal.  Psychiatric:        Mood and Affect: Mood normal.     CMP Latest Ref Rng & Units 11/04/2019  Glucose 70 - 99 mg/dL 129(H)  BUN 8 - 23 mg/dL 28(H)  Creatinine 0.44 - 1.00 mg/dL 1.89(H)  Sodium 135 - 145 mmol/L 141  Potassium 3.5 - 5.1 mmol/L 3.9  Chloride 98 - 111 mmol/L 105  CO2 22 - 32 mmol/L 23  Calcium 8.9 - 10.3 mg/dL 9.3  Total Protein 6.5 - 8.1 g/dL 7.3  Total Bilirubin 0.3 - 1.2 mg/dL 0.7  Alkaline Phos 38 - 126 U/L 51  AST 15 - 41 U/L 23  ALT 0 - 44 U/L 14   CBC Latest Ref Rng & Units 11/04/2019  WBC 4.0 - 10.5 K/uL 10.7(H)  Hemoglobin 12.0 - 15.0 g/dL 12.4  Hematocrit 36 - 46 % 36.3  Platelets 150 - 400 K/uL 304     RADIOGRAPHIC STUDIES: I have personally reviewed the radiological images as listed and agreed with the findings in the report. No results found.  LABORATORY DATA:  I have reviewed the data as listed Lab Results  Component Value Date   WBC 10.7 (H) 11/04/2019   HGB 12.4 11/04/2019   HCT 36.3 11/04/2019   MCV 97.3 11/04/2019   PLT 304 11/04/2019   Recent Labs    01/02/19 1130 04/20/19 1122 11/04/19 1027  NA 138 139 141  K 3.7 3.7 3.9  CL 99 102 105  CO2 19* 22 23  GLUCOSE 144* 127* 129*  BUN 26 15 28*  CREATININE 1.65* 1.55* 1.89*  CALCIUM 10.3 9.2 9.3  GFRNONAA 30* 33* 25*  GFRAA 35* 37* 29*  PROT 6.7 6.4 7.3  ALBUMIN 4.0 3.9 3.8  AST 45* 20 23  ALT 34* 16 14  ALKPHOS 71 65 51  BILITOT 0.8 0.4 0.7   Iron/TIBC/Ferritin/ %Sat    Component Value Date/Time   IRON 56 04/23/2017 1644   TIBC 362 04/23/2017 1644   FERRITIN 19 04/23/2017 1644   IRONPCTSAT 15 04/23/2017 1644        ASSESSMENT & PLAN:  1.  Monoclonal B-cell lymphocytosis of unknown significance    Labs reviewed and discussed with patient. Negative hepatitis, LDH is not elevated, normal vitamin B12, folate, flow cytometry showed CD5+ , CD23 + B-cell population.  CLL/SLL phenotype.  CD38 negative, <1% leukocytes,<5000  cells/ul Diagnosis of monoclonal B-cell lymphocytosis of unknown significance were discussed with patient. Discussed with her that this may be pre-CLL condition.  No treatment is needed. Continue observation.   Orders Placed This Encounter  Procedures  . CBC with Differential/Platelet    Standing Status:   Future    Standing Expiration Date:   11/25/2020  . Comprehensive metabolic panel    Standing Status:   Future    Standing Expiration Date:   11/25/2020  . Lactate dehydrogenase    Standing Status:   Future    Standing Expiration Date:   11/25/2020    All questions were answered. The patient knows to call the clinic with any problems questions or concerns.  Return of visit: 2 weeks to discuss labs. Thank you for this kind referral and the opportunity to participate in the care of this patient. A copy of today's note is routed to referring provider   Earlie Server, MD, PhD Hematology Oncology [facility] Pager- [phone] 11/26/2019

## 2019-12-10 ENCOUNTER — Other Ambulatory Visit: Payer: Self-pay | Admitting: Physician Assistant

## 2019-12-10 DIAGNOSIS — F5101 Primary insomnia: Secondary | ICD-10-CM

## 2019-12-10 NOTE — Telephone Encounter (Signed)
Requested  medications are  due for refill today yes  Requested medications are on the active medication list yes  Last refill 5/10  Last visit July 2021  Future visit scheduled Jan 2022  Notes to clinic urine test not within 360 days, drug not delegated.

## 2019-12-11 NOTE — Telephone Encounter (Signed)
Please advise 

## 2020-02-24 ENCOUNTER — Other Ambulatory Visit: Payer: Self-pay | Admitting: Physician Assistant

## 2020-02-24 DIAGNOSIS — M10471 Other secondary gout, right ankle and foot: Secondary | ICD-10-CM

## 2020-02-24 DIAGNOSIS — R609 Edema, unspecified: Secondary | ICD-10-CM

## 2020-03-06 ENCOUNTER — Other Ambulatory Visit: Payer: Self-pay | Admitting: Physician Assistant

## 2020-03-06 DIAGNOSIS — N1832 Chronic kidney disease, stage 3b: Secondary | ICD-10-CM

## 2020-03-06 DIAGNOSIS — E1122 Type 2 diabetes mellitus with diabetic chronic kidney disease: Secondary | ICD-10-CM

## 2020-03-06 NOTE — Telephone Encounter (Signed)
Requested Prescriptions  Pending Prescriptions Disp Refills  . glipiZIDE (GLUCOTROL XL) 10 MG 24 hr tablet [Pharmacy Med Name: GLIPIZIDE ER 10 MG TABLET] 90 tablet 0    Sig: TAKE 1 TABLET (10 MG TOTAL) BY MOUTH DAILY WITH BREAKFAST.     Endocrinology:  Diabetes - Sulfonylureas Passed - 03/06/2020 12:46 AM      Passed - HBA1C is between 0 and 7.9 and within 180 days    Hemoglobin A1C  Date Value Ref Range Status  10/22/2019 6.6 (A) 4.0 - 5.6 % Final   Hgb A1c MFr Bld  Date Value Ref Range Status  01/02/2019 6.3 (H) 4.8 - 5.6 % Final    Comment:             Prediabetes: 5.7 - 6.4          Diabetes: >6.4          Glycemic control for adults with diabetes: <7.0          Passed - Valid encounter within last 6 months    Recent Outpatient Visits          4 months ago Essential hypertension   [facility] [name] M, PA-C   10 months ago Annual physical exam   [place], Clearnce Sorrel, [place]   1 year ago Essential hypertension   [place], Clearnce Sorrel, [place]   1 year ago Annual physical exam   [facility] [name] M, [place]   2 years ago Type 2 diabetes mellitus without complication, without long-term current use of insulin [facility])   [facility] [place], [place], [place]

## 2020-03-18 ENCOUNTER — Other Ambulatory Visit: Payer: Self-pay | Admitting: Physician Assistant

## 2020-03-18 DIAGNOSIS — E559 Vitamin D deficiency, unspecified: Secondary | ICD-10-CM

## 2020-04-07 ENCOUNTER — Other Ambulatory Visit: Payer: Self-pay | Admitting: Physician Assistant

## 2020-04-07 DIAGNOSIS — E7800 Pure hypercholesterolemia, unspecified: Secondary | ICD-10-CM

## 2020-04-21 NOTE — Progress Notes (Unsigned)
This encounter was created in error - please disregard.

## 2020-04-23 ENCOUNTER — Other Ambulatory Visit: Payer: Self-pay | Admitting: Physician Assistant

## 2020-04-23 DIAGNOSIS — F3342 Major depressive disorder, recurrent, in full remission: Secondary | ICD-10-CM

## 2020-04-23 NOTE — Telephone Encounter (Signed)
Courtesy refill. Pt has appt 04/25/20

## 2020-04-25 ENCOUNTER — Telehealth (INDEPENDENT_AMBULATORY_CARE_PROVIDER_SITE_OTHER): Payer: Medicare Other | Admitting: Physician Assistant

## 2020-04-25 ENCOUNTER — Encounter: Payer: Self-pay | Admitting: Physician Assistant

## 2020-04-25 ENCOUNTER — Other Ambulatory Visit: Payer: Self-pay

## 2020-04-25 ENCOUNTER — Ambulatory Visit: Payer: Medicare Other

## 2020-04-25 DIAGNOSIS — J0140 Acute pansinusitis, unspecified: Secondary | ICD-10-CM | POA: Diagnosis not present

## 2020-04-25 DIAGNOSIS — E1122 Type 2 diabetes mellitus with diabetic chronic kidney disease: Secondary | ICD-10-CM

## 2020-04-25 DIAGNOSIS — M109 Gout, unspecified: Secondary | ICD-10-CM

## 2020-04-25 DIAGNOSIS — E559 Vitamin D deficiency, unspecified: Secondary | ICD-10-CM

## 2020-04-25 DIAGNOSIS — N1832 Chronic kidney disease, stage 3b: Secondary | ICD-10-CM

## 2020-04-25 DIAGNOSIS — K219 Gastro-esophageal reflux disease without esophagitis: Secondary | ICD-10-CM

## 2020-04-25 DIAGNOSIS — R609 Edema, unspecified: Secondary | ICD-10-CM

## 2020-04-25 DIAGNOSIS — E038 Other specified hypothyroidism: Secondary | ICD-10-CM

## 2020-04-25 MED ORDER — GLIPIZIDE ER 10 MG PO TB24: 10.0000 mg | ORAL_TABLET | Freq: Every day | ORAL | 3 refills | Status: DC

## 2020-04-25 MED ORDER — VITAMIN D (ERGOCALCIFEROL) 1.25 MG (50000 UNIT) PO CAPS: 50000.0000 [IU] | ORAL_CAPSULE | ORAL | 3 refills | Status: DC

## 2020-04-25 MED ORDER — ALLOPURINOL 100 MG PO TABS
100.0000 mg | ORAL_TABLET | Freq: Every day | ORAL | 1 refills | Status: DC
Start: 2020-04-25 — End: 2020-12-23

## 2020-04-25 MED ORDER — AZITHROMYCIN 250 MG PO TABS: ORAL_TABLET | ORAL | 0 refills | Status: DC

## 2020-04-25 MED ORDER — LEVOTHYROXINE SODIUM 75 MCG PO TABS
75.0000 ug | ORAL_TABLET | Freq: Every day | ORAL | 3 refills | Status: DC
Start: 2020-04-25 — End: 2021-06-19

## 2020-04-25 MED ORDER — FUROSEMIDE 40 MG PO TABS
40.0000 mg | ORAL_TABLET | Freq: Every day | ORAL | 3 refills | Status: DC
Start: 2020-04-25 — End: 2021-07-24

## 2020-04-25 MED ORDER — FAMOTIDINE 40 MG PO TABS
40.0000 mg | ORAL_TABLET | Freq: Every day | ORAL | 3 refills | Status: DC
Start: 2020-04-25 — End: 2021-05-24

## 2020-04-25 MED ORDER — METFORMIN HCL 1000 MG PO TABS
1000.0000 mg | ORAL_TABLET | Freq: Two times a day (BID) | ORAL | 3 refills | Status: DC
Start: 2020-04-25 — End: 2020-09-27

## 2020-04-25 MED ORDER — BENZONATATE 200 MG PO CAPS: 200.0000 mg | ORAL_CAPSULE | Freq: Three times a day (TID) | ORAL | 0 refills | Status: DC | PRN

## 2020-04-25 MED ORDER — COLCHICINE 0.6 MG PO TABS: 0.6000 mg | ORAL_TABLET | Freq: Two times a day (BID) | ORAL | 5 refills | Status: DC | PRN

## 2020-04-25 NOTE — Progress Notes (Signed)
Subjective:   Donna Malone is a 75 y.o. female who presents for Medicare Annual (Subsequent) preventive examination.  I connected with Donna Malone today by telephone and verified that I am speaking with the correct person using two identifiers. Location patient: home Location provider: work Persons participating in the virtual visit: patient, provider.   I discussed the limitations, risks, security and privacy concerns of performing an evaluation and management service by telephone and the availability of in person appointments. I also discussed with the patient that there may be a patient responsible charge related to this service. The patient expressed understanding and verbally consented to this telephonic visit.    Interactive audio and video telecommunications were attempted between this provider and patient, however failed, due to patient having technical difficulties OR patient did not have access to video capability.  We continued and completed visit with audio only.   Review of Systems    N/A  Cardiac Risk Factors include: advanced age (>44mn, >>20women);diabetes mellitus;dyslipidemia;hypertension;obesity (BMI >30kg/m2)     Objective:    There were no vitals filed for this visit. There is no height or weight on file to calculate BMI.  Advanced Directives 04/26/2020 11/04/2019 04/20/2019 04/14/2018 04/04/2017 03/20/2016 10/29/2015  Does Patient Have a Medical Advance Directive? Yes No Yes Yes No Yes;No No  Type of AParamedicof [place] will - [place] will [place] will - - -  Does patient want to make changes to medical advance directive? - - - - - No - Patient declined -  Copy of HLagroin Chart? No - copy requested - No - copy requested No - copy requested - - -  Would patient like information on creating a medical advance directive? - No - Patient declined - No  - Patient declined No - Patient declined - No - patient declined information    Current Medications (verified) Outpatient Encounter Medications as of 04/26/2020  Medication Sig  . Acetaminophen (EQ ARTHRITIS PAIN PO) Take 1 tablet by mouth 2 (two) times daily. As needed  . allopurinol (ZYLOPRIM) 100 MG tablet Take 1 tablet (100 mg total) by mouth daily.  .Marland Kitchenatorvastatin (LIPITOR) 40 MG tablet TAKE 1 TABLET BY MOUTH EVERY DAY  . azithromycin (ZITHROMAX) 250 MG tablet Take 2 tablets PO on day one, and one tablet PO daily thereafter until completed.  . B Complex Vitamins (GNP VITAMIN B COMPLEX PO) Take by mouth daily.   . benzonatate (TESSALON) 200 MG capsule Take 1 capsule (200 mg total) by mouth 3 (three) times daily as needed.  . Blood Glucose Monitoring Suppl (ONETOUCH VERIO) w/Device KIT To check blood sugar once daily  . colchicine 0.6 MG tablet Take 1 tablet (0.6 mg total) by mouth 2 (two) times daily as needed.  .Marland Kitchenescitalopram (LEXAPRO) 20 MG tablet TAKE 1 TABLET BY MOUTH EVERY DAY  . famotidine (PEPCID) 40 MG tablet Take 1 tablet (40 mg total) by mouth daily.  . ferrous sulfate 325 (65 FE) MG EC tablet Take 325 mg by mouth daily.  . furosemide (LASIX) 40 MG tablet Take 1 tablet (40 mg total) by mouth daily.  .Marland KitchenglipiZIDE (GLUCOTROL XL) 10 MG 24 hr tablet Take 1 tablet (10 mg total) by mouth daily with breakfast.  . glucose blood (ONETOUCH VERIO) test strip To check blood sugar once daily  . Lancets (ONETOUCH ULTRASOFT) lancets To check blood sugar once daily  . levothyroxine (SYNTHROID) 75  MCG tablet Take 1 tablet (75 mcg total) by mouth daily.  . metFORMIN (GLUCOPHAGE) 1000 MG tablet Take 1 tablet (1,000 mg total) by mouth 2 (two) times daily with a meal.  . Misc Natural Products (TART CHERRY ADVANCED PO) Take 1 tablet by mouth daily.  Vladimir Faster Glycol-Propyl Glycol (SYSTANE HYDRATION PF OP) Place 1 drop into both eyes daily as needed (for dry eyes).  . TURMERIC PO Take 1 capsule by  mouth daily.  . Vitamin D, Ergocalciferol, (DRISDOL) 1.25 MG (50000 UNIT) CAPS capsule Take 1 capsule (50,000 Units total) by mouth every 7 (seven) days.  . zaleplon (SONATA) 10 MG capsule TAKE 1 CAPSULE (10 MG TOTAL) BY MOUTH AT BEDTIME AS NEEDED FOR SLEEP.   No facility-administered encounter medications on file as of 04/26/2020.    Allergies (verified) Sulfa antibiotics   History: Past Medical History:  Diagnosis Date  . Asthma   . Bone spur    heel  . Depression   . Gout   . Hypertension    Past Surgical History:  Procedure Laterality Date  . ABDOMINAL HYSTERECTOMY  04/1970   partial  . CHOLECYSTECTOMY  late 1990's   Family History  Problem Relation Age of Onset  . CAD Mother   . Heart attack Mother   . Heart disease Father   . Diabetes Cousin   . Cancer Cousin    Social History   Socioeconomic History  . Marital status: Widowed    Spouse name: Donna Malone  . Number of children: 3  . Years of education: Not on file  . Highest education level: 12th grade  Occupational History  . Occupation: retired  Tobacco Use  . Smoking status: Never Smoker  . Smokeless tobacco: Never Used  Vaping Use  . Vaping Use: Never used  Substance and Sexual Activity  . Alcohol use: No  . Drug use: No  . Sexual activity: Not on file  Other Topics Concern  . Not on file  Social History Narrative  . Not on file   Social Determinants of Health   Financial Resource Strain: Low Risk   . Difficulty of Paying Living Expenses: Not hard at all  Food Insecurity: No Food Insecurity  . Worried About Charity fundraiser in the Last Year: Never true  . Ran Out of Food in the Last Year: Never true  Transportation Needs: No Transportation Needs  . Lack of Transportation (Medical): No  . Lack of Transportation (Non-Medical): No  Physical Activity: Inactive  . Days of Exercise per Week: 0 days  . Minutes of Exercise per Session: 0 min  Stress: No Stress Concern Present  . Feeling of Stress  : Not at all  Social Connections: Moderately Isolated  . Frequency of Communication with Friends and Family: More than three times a week  . Frequency of Social Gatherings with Friends and Family: More than three times a week  . Attends Religious Services: 1 to 4 times per year  . Active Member of Clubs or Organizations: No  . Attends Archivist Meetings: Never  . Marital Status: Widowed    Tobacco Counseling Counseling given: Not Answered   Clinical Intake:  Pre-visit preparation completed: Yes  Pain : No/denies pain     Nutritional Risks: Nausea/ vomitting/ diarrhea (Diarrhea occasionally due to Metformin.) Diabetes: Yes  How often do you need to have someone help you when you read instructions, pamphlets, or other written materials from your doctor or pharmacy?: 1 - Never  Diabetic? Yes  Nutrition Risk Assessment:  Has the patient had any N/V/D within the last 2 months?  No  Does the patient have any non-healing wounds?  No  Has the patient had any unintentional weight loss or weight gain?  No   Diabetes:  Is the patient diabetic?  Yes  If diabetic, was a CBG obtained today?  No  Did the patient bring in their glucometer from home?  No  How often do you monitor your CBG's? Once a day.   Financial Strains and Diabetes Management:  Are you having any financial strains with the device, your supplies or your medication? No .  Does the patient want to be seen by Chronic Care Management for management of their diabetes?  No  Would the patient like to be referred to a Nutritionist or for Diabetic Management?  No   Diabetic Exams:  Diabetic Eye Exam: Completed 11/09/19. Diabetic Foot Exam: Overdue, Pt has been advised about the importance in completing this exam.   Interpreter Needed?: No  Information entered by :: [facility], LPN   Activities of Daily Living In your present state of health, do you have any difficulty performing the following activities:  04/26/2020  Hearing? N  Vision? N  Difficulty concentrating or making decisions? N  Walking or climbing stairs? Y  Comment Due to SOB and pain in her back and legs.  Dressing or bathing? N  Doing errands, shopping? N  Preparing Food and eating ? N  Using the Toilet? N  In the past six months, have you accidently leaked urine? N  Do you have problems with loss of bowel control? N  Managing your Medications? N  Managing your Finances? N  Housekeeping or managing your Housekeeping? N  Some recent data might be hidden    Patient Care Team: Mar Daring, PA-C as PCP - General (Family Medicine) Lorelee Cover., MD as Consulting Physician (Ophthalmology) Lovell Sheehan, MD as Consulting Physician (Orthopedic Surgery) Murlean Iba, MD (Nephrology) Earlie Server, MD as Consulting Physician (Oncology)  Indicate any recent Medical Services you may have received from other than [facility] providers in the past year (date may be approximate).     Assessment:   This is a routine wellness examination for [place].  Hearing/Vision screen No exam data present  Dietary issues and exercise activities discussed: Current Exercise Habits: The patient does not participate in regular exercise at present, Exercise limited by: orthopedic condition(s);None identified  Goals    . Exercise 150 minutes per week (moderate activity)     Recommend to start back with water aerobics for 3 days a week for 55 minutes.     . Peak Blood Glucose < 180      Depression Screen PHQ 2/9 Scores 04/26/2020 10/22/2019 04/20/2019 04/14/2018 04/04/2017 11/20/2016 11/20/2016  PHQ - 2 Score _0 0 0  PHQ- 9 Score _1 - 8 -    Fall Risk Fall Risk  04/26/2020 04/20/2019 04/14/2018 02/07/2018 04/09/2017  Falls in the past year? 0 0 1 1 No  Number falls in past yr: 0 0 0 0 -  Injury with Fall? 0 0 0 0 -  Follow up - - Falls prevention discussed - -    FALL RISK PREVENTION PERTAINING TO THE HOME:  Any stairs in or  around the home? Yes  If so, are there any without handrails? No  Home free of loose throw rugs in walkways, pet beds, electrical cords,  etc? Yes  Adequate lighting in your home to reduce risk of falls? Yes   ASSISTIVE DEVICES UTILIZED TO PREVENT FALLS:  Life alert? No  Use of a cane, walker or w/c? Yes  Grab bars in the bathroom? No  Shower chair or bench in shower? Yes  Elevated toilet seat or a handicapped toilet? Yes    Cognitive Function: Normal cognitive status assessed by direct observation by this Nurse Health Advisor. No abnormalities found.       6CIT Screen 04/14/2018 03/20/2016  What Year? 0 points 0 points  What month? 0 points 0 points  What time? 0 points 0 points  Count back from 20 0 points 0 points  Months in reverse 0 points 0 points  Repeat phrase 0 points 2 points  Total Score 0 2    Immunizations Immunization History  Administered Date(s) Administered  . Fluad Quad(high Dose 65+) 04/20/2019  . Influenza Split 12/16/2009, 03/21/2011, 01/30/2012  . Influenza, High Dose Seasonal PF 02/09/2015, 03/20/2016, 12/24/2016, 02/07/2018  . Influenza,inj,Quad PF,6+ Mos 02/06/2013, 12/03/2013  . PFIZER(Purple Top)SARS-COV-2 Vaccination 06/19/2019, 07/14/2019, 03/18/2020  . Pneumococcal Conjugate-13 12/03/2013  . Pneumococcal Polysaccharide-23 03/21/2011  . Tdap 04/09/2008    TDAP status: Due, Education has been provided regarding the importance of this vaccine. Advised may receive this vaccine at local pharmacy or Health Dept. Aware to provide a copy of the vaccination record if obtained from local pharmacy or Health Dept. Verbalized acceptance and understanding.  Flu Vaccine status: Due, Education has been provided regarding the importance of this vaccine. Advised may receive this vaccine at local pharmacy or Health Dept. Aware to provide a copy of the vaccination record if obtained from local pharmacy or Health Dept. Verbalized acceptance and  understanding.  Pneumococcal vaccine status: Up to date  Covid-19 vaccine status: Completed vaccines  Qualifies for Shingles Vaccine? Yes   Zostavax completed No   Shingrix Completed?: No.    Education has been provided regarding the importance of this vaccine. Patient has been advised to call insurance company to determine out of pocket expense if they have not yet received this vaccine. Advised may also receive vaccine at local pharmacy or Health Dept. Verbalized acceptance and understanding.  Screening Tests Health Maintenance  Topic Date Due  . FOOT EXAM  05/23/2017  . MAMMOGRAM  04/23/2019  . INFLUENZA VACCINE  11/01/2019  . URINE MICROALBUMIN  04/19/2020  . HEMOGLOBIN A1C  04/23/2020  . TETANUS/TDAP  04/26/2021 (Originally 04/09/2018)  . COVID-19 Vaccine (4 - Booster for Pfizer series) 09/16/2020  . OPHTHALMOLOGY EXAM  11/08/2020  . DEXA SCAN  Completed  . Hepatitis C Screening  Completed  . PNA vac Low Risk Adult  Completed    Health Maintenance  Health Maintenance Due  Topic Date Due  . FOOT EXAM  05/23/2017  . MAMMOGRAM  04/23/2019  . INFLUENZA VACCINE  11/01/2019  . URINE MICROALBUMIN  04/19/2020  . HEMOGLOBIN A1C  04/23/2020    Colorectal cancer screening: No longer required.   Mammogram status: Currently due, declined order today.   Bone Density status: Completed 03/10/18. Results reflect: Previous DEXA scan was normal. No repeat needed unless advised by a physician.  Lung Cancer Screening: (Low Dose CT Chest recommended if Age 36-80 years, 30 pack-year currently smoking OR have quit w/in 15years.) does not qualify.   Additional Screening:  Hepatitis C Screening: Up to date  Vision Screening: Recommended annual ophthalmology exams for early detection of glaucoma and other disorders of the eye. Is the  patient up to date with their annual eye exam?  Yes  Who is the provider or what is the name of the office in which the patient attends annual eye exams? Dr  Gloriann Loan If pt is not established with a provider, would they like to be referred to a provider to establish care? No .   Dental Screening: Recommended annual dental exams for proper oral hygiene  Community Resource Referral / Chronic Care Management: CRR required this visit?  No   CCM required this visit?  No      Plan:     I have personally reviewed and noted the following in the patient's chart:   . Medical and social history . Use of alcohol, tobacco or illicit drugs  . Current medications and supplements . Functional ability and status . Nutritional status . Physical activity . Advanced directives . List of other physicians . Hospitalizations, surgeries, and ER visits in previous 12 months . Vitals . Screenings to include cognitive, depression, and falls . Referrals and appointments  In addition, I have reviewed and discussed with patient certain preventive protocols, quality metrics, and best practice recommendations. A written personalized care plan for preventive services as well as general preventive health recommendations were provided to patient.     Lesleigh Hughson [place], [place]   04/16/5206   Nurse Notes: Pt needs diabetic foot exam and urine check at next in office apt. Pt plans to receive a flu shot this season. Pt declines a mammogram order today.

## 2020-04-25 NOTE — Patient Instructions (Signed)
Preventive Care 77 Years and Older, Female Preventive care refers to lifestyle choices and visits with your health care provider that can promote health and wellness. This includes:  A yearly physical exam. This is also called an annual wellness visit.  Regular dental and eye exams.  Immunizations.  Screening for certain conditions.  Healthy lifestyle choices, such as: ? Eating a healthy diet. ? Getting regular exercise. ? Not using drugs or products that contain nicotine and tobacco. ? Limiting alcohol use. What can I expect for my preventive care visit? Physical exam Your health care provider will check your:  Height and weight. These may be used to calculate your BMI (body mass index). BMI is a measurement that tells if you are at a healthy weight.  Heart rate and blood pressure.  Body temperature.  Skin for abnormal spots. Counseling Your health care provider may ask you questions about your:  Past medical problems.  Family's medical history.  Alcohol, tobacco, and drug use.  Emotional well-being.  Home life and relationship well-being.  Sexual activity.  Diet, exercise, and sleep habits.  History of falls.  Memory and ability to understand (cognition).  Work and work Statistician.  Pregnancy and menstrual history.  Access to firearms. What immunizations do I need? Vaccines are usually given at various ages, according to a schedule. Your health care provider will recommend vaccines for you based on your age, medical history, and lifestyle or other factors, such as travel or where you work.   What tests do I need? Blood tests  Lipid and cholesterol levels. These may be checked every 5 years, or more often depending on your overall health.  Hepatitis C test.  Hepatitis B test. Screening  Lung cancer screening. You may have this screening every year starting at age 77 if you have a 30-pack-year history of smoking and currently smoke or have quit within  the past 15 years.  Colorectal cancer screening. ? All adults should have this screening starting at age 77 and continuing until age 77. ? Your health care provider may recommend screening at age 77 if you are at increased risk. ? You will have tests every 1-10 years, depending on your results and the type of screening test.  Diabetes screening. ? This is done by checking your blood sugar (glucose) after you have not eaten for a while (fasting). ? You may have this done every 1-3 years.  Mammogram. ? This may be done every 1-2 years. ? Talk with your health care provider about how often you should have regular mammograms.  Abdominal aortic aneurysm (AAA) screening. You may need this if you are a current or former smoker.  BRCA-related cancer screening. This may be done if you have a family history of breast, ovarian, tubal, or peritoneal cancers. Other tests  STD (sexually transmitted disease) testing, if you are at risk.  Bone density scan. This is done to screen for osteoporosis. You may have this done starting at age 77. Talk with your health care provider about your test results, treatment options, and if necessary, the need for more tests. Follow these instructions at home: Eating and drinking  Eat a diet that includes fresh fruits and vegetables, whole grains, lean protein, and low-fat dairy products. Limit your intake of foods with high amounts of sugar, saturated fats, and salt.  Take vitamin and mineral supplements as recommended by your health care provider.  Do not drink alcohol if your health care provider tells you not to drink.  If you drink alcohol: ? Limit how much you have to 0-1 drink a day. ? Be aware of how much alcohol is in your drink. In the [place] one drink equals one 12 oz bottle of beer (355 mL), one 5 oz glass of wine (148 mL), or one 1 oz glass of hard liquor (44 mL).   Lifestyle  Take daily care of your teeth and gums. Brush your teeth every morning  and night with fluoride toothpaste. Floss one time each day.  Stay active. Exercise for at least 30 minutes 5 or more days each week.  Do not use any products that contain nicotine or tobacco, such as cigarettes, e-cigarettes, and chewing tobacco. If you need help quitting, ask your health care provider.  Do not use drugs.  If you are sexually active, practice safe sex. Use a condom or other form of protection in order to prevent STIs (sexually transmitted infections).  Talk with your health care provider about taking a low-dose aspirin or statin.  Find healthy ways to cope with stress, such as: ? Meditation, yoga, or listening to music. ? Journaling. ? Talking to a trusted person. ? Spending time with friends and family. Safety  Always wear your seat belt while driving or riding in a vehicle.  Do not drive: ? If you have been drinking alcohol. Do not ride with someone who has been drinking. ? When you are tired or distracted. ? While texting.  Wear a helmet and other protective equipment during sports activities.  If you have firearms in your house, make sure you follow all gun safety procedures. What's next?  Visit your health care provider once a year for an annual wellness visit.  Ask your health care provider how often you should have your eyes and teeth checked.  Stay up to date on all vaccines. This information is not intended to replace advice given to you by your health care provider. Make sure you discuss any questions you have with your health care provider. Document Revised: 03/09/2020 Document Reviewed: 03/13/2018 Elsevier Patient Education  2021 [employer]  

## 2020-04-25 NOTE — Progress Notes (Signed)
Virtual telephone visit    Virtual Visit via Telephone Note   This visit type was conducted due to national recommendations for restrictions regarding the COVID-19 Pandemic (e.g. social distancing) in an effort to limit this patient's exposure and mitigate transmission in our community. Due to her co-morbid illnesses, this patient is at least at moderate risk for complications without adequate follow up. This format is felt to be most appropriate for this patient at this time. The patient did not have access to video technology or had technical difficulties with video requiring transitioning to audio format only (telephone). Physical exam was limited to content and character of the telephone converstion.    Patient location: Home Provider location: [facility]  I discussed the limitations of evaluation and management by telemedicine and the availability of in person appointments. The patient expressed understanding and agreed to proceed.   Visit Date: 04/25/2020  Today's healthcare provider: Mar Daring, PA-C   No chief complaint on file.  Subjective    Cough This is a new problem. The current episode started 1 to 4 weeks ago. The problem has been unchanged. The problem occurs every few hours. The cough is non-productive. Associated symptoms include a fever (low grade), myalgias (back pain), nasal congestion and postnasal drip. Pertinent negatives include no chest pain, chills, ear congestion, ear pain, headaches, heartburn, hemoptysis, rash, rhinorrhea, sore throat, shortness of breath, sweats, weight loss or wheezing. The symptoms are aggravated by lying down. She has tried rest for the symptoms. The treatment provided mild relief. Her past medical history is significant for environmental allergies.      Patient Active Problem List   Diagnosis Date Noted   Right arm pain 10/22/2019   Degenerative arthritis of knee, bilateral 05/13/2018   Pes planus  05/13/2018   Bronchitis 09/19/2015   Hypertension 04/01/2015   Leukocytosis 12/22/2014   Abnormal finding on mammography 09/21/2014   Hypothyroidism 09/21/2014   Obesity (BMI 30.0-34.9) 09/21/2014   Chronic kidney disease, stage III (moderate) ([place]) 09/21/2014   Type 2 diabetes mellitus with stage 3b chronic kidney disease, without long-term current use of insulin ([place]) 09/21/2014   Depressive disorder 09/21/2014   Accumulation of fluid in tissues 09/21/2014   Esophageal reflux 09/21/2014   Gout 09/21/2014   Hypercholesteremia 09/21/2014   Insomnia 09/21/2014   Vitamin D deficiency, unspecified 09/21/2014   Low back pain 01/15/2014   Other chest pain 01/15/2014   Atrophic vaginitis 01/15/2014   Past Medical History:  Diagnosis Date   Asthma    Bone spur    heel   Depression    Gout    Hypertension       Medications: Outpatient Medications Prior to Visit  Medication Sig   zaleplon (SONATA) 10 MG capsule TAKE 1 CAPSULE (10 MG TOTAL) BY MOUTH AT BEDTIME AS NEEDED FOR SLEEP.   Acetaminophen (EQ ARTHRITIS PAIN PO) Take 1 tablet by mouth 2 (two) times daily. As needed   allopurinol (ZYLOPRIM) 100 MG tablet TAKE 1 TABLET BY MOUTH EVERY DAY   atorvastatin (LIPITOR) 40 MG tablet TAKE 1 TABLET BY MOUTH EVERY DAY   B Complex Vitamins (GNP VITAMIN B COMPLEX PO) Take by mouth daily.    Blood Glucose Monitoring Suppl (ONETOUCH VERIO) w/Device KIT To check blood sugar once daily   colchicine 0.6 MG tablet TAKE 1 TABLET BY MOUTH TWICE A DAY (Patient taking differently: TAKE 1 TABLET BY MOUTH TWICE A DAY as needed)   escitalopram (LEXAPRO) 20 MG tablet  TAKE 1 TABLET BY MOUTH EVERY DAY   famotidine (PEPCID) 40 MG tablet TAKE 1 TABLET BY MOUTH EVERY DAY   ferrous sulfate 325 (65 FE) MG EC tablet Take 325 mg by mouth daily.   furosemide (LASIX) 40 MG tablet TAKE 1 TABLET BY MOUTH EVERY DAY   glipiZIDE (GLUCOTROL XL) 10 MG 24 hr tablet TAKE 1 TABLET (10 MG  TOTAL) BY MOUTH DAILY WITH BREAKFAST.   glucose blood (ONETOUCH VERIO) test strip To check blood sugar once daily   Lancets (ONETOUCH ULTRASOFT) lancets To check blood sugar once daily   levothyroxine (SYNTHROID) 75 MCG tablet TAKE 1 TABLET BY MOUTH EVERY DAY   metFORMIN (GLUCOPHAGE) 1000 MG tablet Take 1 tablet (1,000 mg total) by mouth 2 (two) times daily with a meal.   Misc Natural Products (TART CHERRY ADVANCED PO) Take 1 tablet by mouth daily.   Polyethyl Glycol-Propyl Glycol (SYSTANE HYDRATION PF OP) Apply to eye.   TURMERIC PO Take 1 capsule by mouth daily.   Vitamin D, Ergocalciferol, (DRISDOL) 1.25 MG (50000 UNIT) CAPS capsule TAKE 1 CAPSULE BY MOUTH ONE TIME PER WEEK   No facility-administered medications prior to visit.    Review of Systems  Constitutional: Positive for fever (low grade). Negative for chills and weight loss.  HENT: Positive for postnasal drip. Negative for ear pain, rhinorrhea and sore throat.   Respiratory: Positive for cough. Negative for hemoptysis, shortness of breath and wheezing.   Cardiovascular: Negative for chest pain.  Gastrointestinal: Negative for heartburn.  Musculoskeletal: Positive for myalgias (back pain).  Skin: Negative for rash.  Allergic/Immunologic: Positive for environmental allergies.  Neurological: Negative for headaches.    Last CBC Lab Results  Component Value Date   WBC 10.7 (H) 11/04/2019   HGB 12.4 11/04/2019   HCT 36.3 11/04/2019   MCV 97.3 11/04/2019   MCH 33.2 11/04/2019   RDW 13.2 11/04/2019   PLT 304 23/55/7322   Last metabolic panel Lab Results  Component Value Date   GLUCOSE 129 (H) 11/04/2019   NA 141 11/04/2019   K 3.9 11/04/2019   CL 105 11/04/2019   CO2 23 11/04/2019   BUN 28 (H) 11/04/2019   CREATININE 1.89 (H) 11/04/2019   GFRNONAA 25 (L) 11/04/2019   GFRAA 29 (L) 11/04/2019   CALCIUM 9.3 11/04/2019   PHOS 3.1 12/21/2016   PROT 7.3 11/04/2019   ALBUMIN 3.8 11/04/2019   LABGLOB 2.5  04/20/2019   AGRATIO 1.6 04/20/2019   BILITOT 0.7 11/04/2019   ALKPHOS 51 11/04/2019   AST 23 11/04/2019   ALT 14 11/04/2019   ANIONGAP 13 11/04/2019      Objective    There were no vitals taken for this visit. BP Readings from Last 3 Encounters:  04/25/20 122/66  11/26/19 111/76  11/04/19 138/83   Wt Readings from Last 3 Encounters:  04/25/20 197 lb (89.4 kg)  11/26/19 191 lb 1.6 oz (86.7 kg)  11/04/19 194 lb 9.6 oz (88.3 kg)        Assessment & Plan     1. Acute non-recurrent pansinusitis Worsening symptoms that have not responded to OTC medications. Will give azithromycin as below. Tessalon perles for cough. Continue allergy medications. Stay well hydrated and get plenty of rest. Call if no symptom improvement or if symptoms worsen. - azithromycin (ZITHROMAX) 250 MG tablet; Take 2 tablets PO on day one, and one tablet PO daily thereafter until completed.  Dispense: 6 tablet; Refill: 0 - benzonatate (TESSALON) 200 MG capsule; Take 1  capsule (200 mg total) by mouth 3 (three) times daily as needed.  Dispense: 30 capsule; Refill: 0  2. Acute gout of left foot, unspecified cause Stable. Diagnosis pulled for medication refill. Continue current medical treatment plan. - allopurinol (ZYLOPRIM) 100 MG tablet; Take 1 tablet (100 mg total) by mouth daily.  Dispense: 90 tablet; Refill: 1 - colchicine 0.6 MG tablet; Take 1 tablet (0.6 mg total) by mouth 2 (two) times daily as needed.  Dispense: 60 tablet; Refill: 5  3. Gastroesophageal reflux disease without esophagitis Stable. Diagnosis pulled for medication refill. Continue current medical treatment plan. Will check labs as below and f/u pending results. - CBC w/Diff/Platelet - Comprehensive Metabolic Panel (CMET) - famotidine (PEPCID) 40 MG tablet; Take 1 tablet (40 mg total) by mouth daily.  Dispense: 90 tablet; Refill: 3  4. Edema, unspecified type Stable. Diagnosis pulled for medication refill. Continue current medical  treatment plan. - furosemide (LASIX) 40 MG tablet; Take 1 tablet (40 mg total) by mouth daily.  Dispense: 90 tablet; Refill: 3  5. Type 2 diabetes mellitus with stage 3b chronic kidney disease, without long-term current use of insulin ([facility]) Stable. Diagnosis pulled for medication refill. Continue current medical treatment plan. Will check labs as below and f/u pending results. - CBC w/Diff/Platelet - Comprehensive Metabolic Panel (CMET) - Lipid Panel With LDL/HDL Ratio - HgB A1c - glipiZIDE (GLUCOTROL XL) 10 MG 24 hr tablet; Take 1 tablet (10 mg total) by mouth daily with breakfast.  Dispense: 90 tablet; Refill: 3 - metFORMIN (GLUCOPHAGE) 1000 MG tablet; Take 1 tablet (1,000 mg total) by mouth 2 (two) times daily with a meal.  Dispense: 180 tablet; Refill: 3  6. Other specified hypothyroidism Stable. Diagnosis pulled for medication refill. Continue current medical treatment plan. Will check labs as below and f/u pending results. - levothyroxine (SYNTHROID) 75 MCG tablet; Take 1 tablet (75 mcg total) by mouth daily.  Dispense: 90 tablet; Refill: 3 - TSH  7. Avitaminosis D Stable. Diagnosis pulled for medication refill. Continue current medical treatment plan. Will check labs as below and f/u pending results. - CBC w/Diff/Platelet - Vitamin D, Ergocalciferol, (DRISDOL) 1.25 MG (50000 UNIT) CAPS capsule; Take 1 capsule (50,000 Units total) by mouth every 7 (seven) days.  Dispense: 12 capsule; Refill: 3 - Vitamin D (25 hydroxy)   No follow-ups on file.    I discussed the assessment and treatment plan with the patient. The patient was provided an opportunity to ask questions and all were answered. The patient agreed with the plan and demonstrated an understanding of the instructions.   The patient was advised to call back or seek an in-person evaluation if the symptoms worsen or if the condition fails to improve as anticipated.  I provided 13 minutes of non-face-to-face time during this  encounter.  Reynolds Bowl, PA-C, have reviewed all documentation for this visit. The documentation on 04/25/20 for the exam, diagnosis, procedures, and orders are all accurate and complete.  [place] [facility] [phone] (phone) [phone] (fax)  [place]

## 2020-04-26 ENCOUNTER — Telehealth (INDEPENDENT_AMBULATORY_CARE_PROVIDER_SITE_OTHER): Payer: Medicare Other

## 2020-04-26 DIAGNOSIS — Z0000 Encounter for general adult medical examination without abnormal findings: Secondary | ICD-10-CM

## 2020-04-26 NOTE — Patient Instructions (Signed)
Donna Malone , Thank you for taking time to come for your Medicare Wellness Visit. I appreciate your ongoing commitment to your health goals. Please review the following plan we discussed and let me know if I can assist you in the future.   Screening recommendations/referrals: Colonoscopy: No longer required.  Mammogram: Currently due, declined order. Bone Density: Previous DEXA scan was normal. No repeat needed unless advised by a physician. Recommended yearly ophthalmology/optometry visit for glaucoma screening and checkup Recommended yearly dental visit for hygiene and checkup  Vaccinations: Influenza vaccine: Currently due, patient to receive. Pneumococcal vaccine: Completed series Tdap vaccine: Currently due, declined receiving.  Shingles vaccine: Shingrix discussed. Please contact your pharmacy for coverage information.     Advanced directives: Please bring a copy of your POA (Power of Attorney) and/or Living Will to your next appointment.   Conditions/risks identified: Recommend to start exercising for 3 days a week for at least 30 minutes at a time.   Next appointment: 05/02/21 @ 8:20 AM for an AWV. Declined scheduling a follow up with PCP at this time.    Preventive Care 77 Years and Older, Female Preventive care refers to lifestyle choices and visits with your health care provider that can promote health and wellness. What does preventive care include?  A yearly physical exam. This is also called an annual well check.  Dental exams once or twice a year.  Routine eye exams. Ask your health care provider how often you should have your eyes checked.  Personal lifestyle choices, including:  Daily care of your teeth and gums.  Regular physical activity.  Eating a healthy diet.  Avoiding tobacco and drug use.  Limiting alcohol use.  Practicing safe sex.  Taking low-dose aspirin every day.  Taking vitamin and mineral supplements as recommended by your health care  provider. What happens during an annual well check? The services and screenings done by your health care provider during your annual well check will depend on your age, overall health, lifestyle risk factors, and family history of disease. Counseling  Your health care provider may ask you questions about your:  Alcohol use.  Tobacco use.  Drug use.  Emotional well-being.  Home and relationship well-being.  Sexual activity.  Eating habits.  History of falls.  Memory and ability to understand (cognition).  Work and work Statistician.  Reproductive health. Screening  You may have the following tests or measurements:  Height, weight, and BMI.  Blood pressure.  Lipid and cholesterol levels. These may be checked every 5 years, or more frequently if you are over 49 years old.  Skin check.  Lung cancer screening. You may have this screening every year starting at age 77 if you have a 30-pack-year history of smoking and currently smoke or have quit within the past 15 years.  Fecal occult blood test (FOBT) of the stool. You may have this test every year starting at age 77.  Flexible sigmoidoscopy or colonoscopy. You may have a sigmoidoscopy every 5 years or a colonoscopy every 10 years starting at age 22.  Hepatitis C blood test.  Hepatitis B blood test.  Sexually transmitted disease (STD) testing.  Diabetes screening. This is done by checking your blood sugar (glucose) after you have not eaten for a while (fasting). You may have this done every 1-3 years.  Bone density scan. This is done to screen for osteoporosis. You may have this done starting at age 77.  Mammogram. This may be done every 1-2 years. Talk to  your health care provider about how often you should have regular mammograms. Talk with your health care provider about your test results, treatment options, and if necessary, the need for more tests. Vaccines  Your health care provider may recommend certain  vaccines, such as:  Influenza vaccine. This is recommended every year.  Tetanus, diphtheria, and acellular pertussis (Tdap, Td) vaccine. You may need a Td booster every 10 years.  Zoster vaccine. You may need this after age 50.  Pneumococcal 13-valent conjugate (PCV13) vaccine. One dose is recommended after age 77.  Pneumococcal polysaccharide (PPSV23) vaccine. One dose is recommended after age 77. Talk to your health care provider about which screenings and vaccines you need and how often you need them. This information is not intended to replace advice given to you by your health care provider. Make sure you discuss any questions you have with your health care provider. Document Released: 04/15/2015 Document Revised: 12/07/2015 Document Reviewed: 01/18/2015 Elsevier Interactive Patient Education  2017 [employer] Prevention in the Home Falls can cause injuries. They can happen to people of all ages. There are many things you can do to make your home safe and to help prevent falls. What can I do on the outside of my home?  Regularly fix the edges of walkways and driveways and fix any cracks.  Remove anything that might make you trip as you walk through a door, such as a raised step or threshold.  Trim any bushes or trees on the path to your home.  Use bright outdoor lighting.  Clear any walking paths of anything that might make someone trip, such as rocks or tools.  Regularly check to see if handrails are loose or broken. Make sure that both sides of any steps have handrails.  Any raised decks and porches should have guardrails on the edges.  Have any leaves, snow, or ice cleared regularly.  Use sand or salt on walking paths during winter.  Clean up any spills in your garage right away. This includes oil or grease spills. What can I do in the bathroom?  Use night lights.  Install grab bars by the toilet and in the tub and shower. Do not use towel bars as grab  bars.  Use non-skid mats or decals in the tub or shower.  If you need to sit down in the shower, use a plastic, non-slip stool.  Keep the floor dry. Clean up any water that spills on the floor as soon as it happens.  Remove soap buildup in the tub or shower regularly.  Attach bath mats securely with double-sided non-slip rug tape.  Do not have throw rugs and other things on the floor that can make you trip. What can I do in the bedroom?  Use night lights.  Make sure that you have a light by your bed that is easy to reach.  Do not use any sheets or blankets that are too big for your bed. They should not hang down onto the floor.  Have a firm chair that has side arms. You can use this for support while you get dressed.  Do not have throw rugs and other things on the floor that can make you trip. What can I do in the kitchen?  Clean up any spills right away.  Avoid walking on wet floors.  Keep items that you use a lot in easy-to-reach places.  If you need to reach something above you, use a strong step stool that has  a grab bar.  Keep electrical cords out of the way.  Do not use floor polish or wax that makes floors slippery. If you must use wax, use non-skid floor wax.  Do not have throw rugs and other things on the floor that can make you trip. What can I do with my stairs?  Do not leave any items on the stairs.  Make sure that there are handrails on both sides of the stairs and use them. Fix handrails that are broken or loose. Make sure that handrails are as long as the stairways.  Check any carpeting to make sure that it is firmly attached to the stairs. Fix any carpet that is loose or worn.  Avoid having throw rugs at the top or bottom of the stairs. If you do have throw rugs, attach them to the floor with carpet tape.  Make sure that you have a light switch at the top of the stairs and the bottom of the stairs. If you do not have them, ask someone to add them for  you. What else can I do to help prevent falls?  Wear shoes that:  Do not have high heels.  Have rubber bottoms.  Are comfortable and fit you well.  Are closed at the toe. Do not wear sandals.  If you use a stepladder:  Make sure that it is fully opened. Do not climb a closed stepladder.  Make sure that both sides of the stepladder are locked into place.  Ask someone to hold it for you, if possible.  Clearly mark and make sure that you can see:  Any grab bars or handrails.  First and last steps.  Where the edge of each step is.  Use tools that help you move around (mobility aids) if they are needed. These include:  Canes.  Walkers.  Scooters.  Crutches.  Turn on the lights when you go into a dark area. Replace any light bulbs as soon as they burn out.  Set up your furniture so you have a clear path. Avoid moving your furniture around.  If any of your floors are uneven, fix them.  If there are any pets around you, be aware of where they are.  Review your medicines with your doctor. Some medicines can make you feel dizzy. This can increase your chance of falling. Ask your doctor what other things that you can do to help prevent falls. This information is not intended to replace advice given to you by your health care provider. Make sure you discuss any questions you have with your health care provider. Document Released: 01/13/2009 Document Revised: 08/25/2015 Document Reviewed: 04/23/2014 Elsevier Interactive Patient Education  2017 [employer].

## 2020-04-28 LAB — COMPREHENSIVE METABOLIC PANEL
ALT: 10 IU/L (ref 0–32)
AST: 13 IU/L (ref 0–40)
Albumin/Globulin Ratio: 1.4 (ref 1.2–2.2)
Albumin: 3.7 g/dL (ref 3.7–4.7)
Alkaline Phosphatase: 78 IU/L (ref 44–121)
BUN/Creatinine Ratio: 12 (ref 12–28)
BUN: 17 mg/dL (ref 8–27)
Bilirubin Total: 0.4 mg/dL (ref 0.0–1.2)
CO2: 20 mmol/L (ref 20–29)
Calcium: 9.5 mg/dL (ref 8.7–10.3)
Chloride: 102 mmol/L (ref 96–106)
Creatinine, Ser: 1.4 mg/dL — ABNORMAL HIGH (ref 0.57–1.00)
GFR calc Af Amer: 42 mL/min/{1.73_m2} — ABNORMAL LOW (ref >59)
GFR calc non Af Amer: 36 mL/min/{1.73_m2} — ABNORMAL LOW (ref >59)
Globulin, Total: 2.7 g/dL (ref 1.5–4.5)
Glucose: 97 mg/dL (ref 65–99)
Potassium: 4.1 mmol/L (ref 3.5–5.2)
Sodium: 140 mmol/L (ref 134–144)
Total Protein: 6.4 g/dL (ref 6.0–8.5)

## 2020-04-28 LAB — LIPID PANEL WITH LDL/HDL RATIO
Cholesterol, Total: 194 mg/dL (ref 100–199)
HDL: 33 mg/dL — ABNORMAL LOW (ref >39)
LDL Chol Calc (NIH): 103 mg/dL — ABNORMAL HIGH (ref 0–99)
LDL/HDL Ratio: 3.1 ratio (ref 0.0–3.2)
Triglycerides: 340 mg/dL — ABNORMAL HIGH (ref 0–149)
VLDL Cholesterol Cal: 58 mg/dL — ABNORMAL HIGH (ref 5–40)

## 2020-04-28 LAB — CBC WITH DIFFERENTIAL/PLATELET
Basophils Absolute: 0.1 10*3/uL (ref 0.0–0.2)
Basos: 1 %
EOS (ABSOLUTE): 0.4 10*3/uL (ref 0.0–0.4)
Eos: 4 %
Hematocrit: 35.9 % (ref 34.0–46.6)
Hemoglobin: 12.5 g/dL (ref 11.1–15.9)
Immature Grans (Abs): 0 10*3/uL (ref 0.0–0.1)
Immature Granulocytes: 0 %
Lymphocytes Absolute: 3.4 10*3/uL — ABNORMAL HIGH (ref 0.7–3.1)
Lymphs: 41 %
MCH: 33.6 pg — ABNORMAL HIGH (ref 26.6–33.0)
MCHC: 34.8 g/dL (ref 31.5–35.7)
MCV: 97 fL (ref 79–97)
Monocytes Absolute: 0.6 10*3/uL (ref 0.1–0.9)
Monocytes: 8 %
Neutrophils Absolute: 3.8 10*3/uL (ref 1.4–7.0)
Neutrophils: 46 %
Platelets: 288 10*3/uL (ref 150–450)
RBC: 3.72 x10E6/uL — ABNORMAL LOW (ref 3.77–5.28)
RDW: 12.6 % (ref 11.7–15.4)
WBC: 8.3 10*3/uL (ref 3.4–10.8)

## 2020-04-28 LAB — HEMOGLOBIN A1C
Est. average glucose Bld gHb Est-mCnc: 140 mg/dL
Hgb A1c MFr Bld: 6.5 % — ABNORMAL HIGH (ref 4.8–5.6)

## 2020-04-28 LAB — TSH: TSH: 2.32 u[IU]/mL (ref 0.450–4.500)

## 2020-04-28 LAB — VITAMIN D 25 HYDROXY (VIT D DEFICIENCY, FRACTURES): Vit D, 25-Hydroxy: 74.2 ng/mL (ref 30.0–100.0)

## 2020-05-09 ENCOUNTER — Telehealth: Payer: Self-pay

## 2020-05-09 DIAGNOSIS — G8929 Other chronic pain: Secondary | ICD-10-CM

## 2020-05-09 MED ORDER — MELOXICAM 7.5 MG PO TABS: 7.5000 mg | ORAL_TABLET | Freq: Every day | ORAL | 0 refills | Status: DC

## 2020-05-09 NOTE — Telephone Encounter (Signed)
Copied from [place] [id number]. Topic: General - Other >> May 09, 2020  2:40 PM Pawlus, Brayton Layman A wrote: Reason for CRM: PT did not want to give me specific details and is requesting a call back from [place] or her nurse for medical advice. Please advise.

## 2020-05-09 NOTE — Telephone Encounter (Signed)
Returned patients call and she would like to know if you could prescribe her a medication  for inflammation/pain in her back. She states to let you know that the medication you prescribed for her cough helped out so much and thank you. Please advise.

## 2020-05-09 NOTE — Telephone Encounter (Signed)
Meloxicam 7.5mg  sent in. She can use once daily prn for back pain

## 2020-05-09 NOTE — Telephone Encounter (Signed)
Pt advised.   Thanks,   -Matthew Cina  

## 2020-05-26 ENCOUNTER — Encounter: Payer: Self-pay | Admitting: Oncology

## 2020-05-26 ENCOUNTER — Inpatient Hospital Stay: Payer: Medicare Other | Admitting: Oncology

## 2020-05-26 ENCOUNTER — Inpatient Hospital Stay: Payer: Medicare Other | Attending: Oncology

## 2020-05-26 VITALS — BP 120/73 | HR 70 | Temp 97.4°F | Wt 192.1 lb

## 2020-05-26 DIAGNOSIS — D649 Anemia, unspecified: Secondary | ICD-10-CM | POA: Diagnosis not present

## 2020-05-26 DIAGNOSIS — M109 Gout, unspecified: Secondary | ICD-10-CM | POA: Insufficient documentation

## 2020-05-26 DIAGNOSIS — N184 Chronic kidney disease, stage 4 (severe): Secondary | ICD-10-CM

## 2020-05-26 DIAGNOSIS — Z79899 Other long term (current) drug therapy: Secondary | ICD-10-CM | POA: Insufficient documentation

## 2020-05-26 DIAGNOSIS — Z791 Long term (current) use of non-steroidal anti-inflammatories (NSAID): Secondary | ICD-10-CM | POA: Insufficient documentation

## 2020-05-26 DIAGNOSIS — Z7984 Long term (current) use of oral hypoglycemic drugs: Secondary | ICD-10-CM | POA: Insufficient documentation

## 2020-05-26 DIAGNOSIS — D72820 Lymphocytosis (symptomatic): Secondary | ICD-10-CM

## 2020-05-26 DIAGNOSIS — I129 Hypertensive chronic kidney disease with stage 1 through stage 4 chronic kidney disease, or unspecified chronic kidney disease: Secondary | ICD-10-CM | POA: Insufficient documentation

## 2020-05-26 DIAGNOSIS — R5383 Other fatigue: Secondary | ICD-10-CM | POA: Diagnosis not present

## 2020-05-26 LAB — CBC WITH DIFFERENTIAL/PLATELET
Abs Immature Granulocytes: 0.04 10*3/uL (ref 0.00–0.07)
Basophils Absolute: 0.1 10*3/uL (ref 0.0–0.1)
Basophils Relative: 1 %
Eosinophils Absolute: 0.5 10*3/uL (ref 0.0–0.5)
Eosinophils Relative: 5 %
HCT: 36 % (ref 36.0–46.0)
Hemoglobin: 11.9 g/dL — ABNORMAL LOW (ref 12.0–15.0)
Immature Granulocytes: 0 %
Lymphocytes Relative: 30 %
Lymphs Abs: 2.7 10*3/uL (ref 0.7–4.0)
MCH: 32.8 pg (ref 26.0–34.0)
MCHC: 33.1 g/dL (ref 30.0–36.0)
MCV: 99.2 fL (ref 80.0–100.0)
Monocytes Absolute: 0.7 10*3/uL (ref 0.1–1.0)
Monocytes Relative: 7 %
Neutro Abs: 5.1 10*3/uL (ref 1.7–7.7)
Neutrophils Relative %: 57 %
Platelets: 263 10*3/uL (ref 150–400)
RBC: 3.63 MIL/uL — ABNORMAL LOW (ref 3.87–5.11)
RDW: 13.4 % (ref 11.5–15.5)
WBC: 9 10*3/uL (ref 4.0–10.5)
nRBC: 0 % (ref 0.0–0.2)

## 2020-05-26 LAB — COMPREHENSIVE METABOLIC PANEL
ALT: 11 U/L (ref 0–44)
AST: 18 U/L (ref 15–41)
Albumin: 3.5 g/dL (ref 3.5–5.0)
Alkaline Phosphatase: 71 U/L (ref 38–126)
Anion gap: 12 (ref 5–15)
BUN: 26 mg/dL — ABNORMAL HIGH (ref 8–23)
CO2: 25 mmol/L (ref 22–32)
Calcium: 9.2 mg/dL (ref 8.9–10.3)
Chloride: 102 mmol/L (ref 98–111)
Creatinine, Ser: 2.06 mg/dL — ABNORMAL HIGH (ref 0.44–1.00)
GFR, Estimated: 24 mL/min — ABNORMAL LOW (ref >60)
Glucose, Bld: 141 mg/dL — ABNORMAL HIGH (ref 70–99)
Potassium: 4.1 mmol/L (ref 3.5–5.1)
Sodium: 139 mmol/L (ref 135–145)
Total Bilirubin: 0.6 mg/dL (ref 0.3–1.2)
Total Protein: 6.9 g/dL (ref 6.5–8.1)

## 2020-05-26 LAB — LACTATE DEHYDROGENASE: LDH: 87 U/L — ABNORMAL LOW (ref 98–192)

## 2020-05-26 NOTE — Progress Notes (Signed)
Hematology/Oncology Consult note [facility] Telephone:([phone] [phone]   Patient Care Team: Rubye Beach as PCP - General (Family Medicine) Lorelee Cover., MD as Consulting Physician (Ophthalmology) Lovell Sheehan, MD as Consulting Physician (Orthopedic Surgery) Murlean Iba, MD (Nephrology) Earlie Server, MD as Consulting Physician (Oncology)  REFERRING PROVIDER: Mar Daring, PA-C  CHIEF COMPLAINTS/REASON FOR VISIT:  Evaluation of leukocytosis  HISTORY OF PRESENTING ILLNESS:  Donna Malone is a  75 y.o.  female with PMH listed below who was referred to me for evaluation of leukocytosis Reviewed patient' recent labs obtained by PCP.   10/22/2019 CBC showed elevated white count of 11.3, predominantly lymphocytosis.  Normal hemoglobin and platelet count. Previous lab records reviewed. Leukocytosis onset of chronic, duration is since January 2021.   No aggravating or elevated factors. Associated symptoms or signs:  Denies weight loss, fever, chills,night sweats.  Endorses fatigue Smoking history: Never smoker History of recent oral steroid use or steroid injection: No recent steroid injections. History of recent infection: Denies Autoimmune disease history.  Denies  Patient denies any chronic wound, prosthesis.  She has chronic knee arthritis.  #  flow cytometry showed CD5+ , CD23 + B-cell population.  CLL/SLL phenotype.  CD38 negative, <1% leukocytes,<5000 cells/ul INTERVAL HISTORY Donna Malone is a 75 y.o. female who has above history reviewed by me today presents for follow up visit for for leukocytosis Problems and complaints are listed below: Patient lost her husband in January 2022.  She reports not eating very well.  She has good family support. Otherwise no new complaints.  Denies any night sweats, unintentional weight loss, fever or chills. Chronic fatigue at baseline. Review of Systems   Constitutional: Positive for fatigue. Negative for appetite change, chills and fever.  HENT:   Negative for hearing loss and voice change.   Eyes: Negative for eye problems.  Respiratory: Negative for chest tightness and cough.   Cardiovascular: Negative for chest pain.  Gastrointestinal: Negative for abdominal distention, abdominal pain and blood in stool.  Endocrine: Negative for hot flashes.  Genitourinary: Negative for difficulty urinating and frequency.   Musculoskeletal: Positive for arthralgias.  Skin: Negative for itching and rash.  Neurological: Negative for extremity weakness.  Hematological: Negative for adenopathy.  Psychiatric/Behavioral: Negative for confusion.     MEDICAL HISTORY:  Past Medical History:  Diagnosis Date  . Asthma   . Bone spur    heel  . Depression   . Gout   . Hypertension     SURGICAL HISTORY: Past Surgical History:  Procedure Laterality Date  . ABDOMINAL HYSTERECTOMY  04/1970   partial  . CHOLECYSTECTOMY  late 1990's    SOCIAL HISTORY: Social History   Socioeconomic History  . Marital status: Widowed    Spouse name: Donna Malone  . Number of children: 3  . Years of education: Not on file  . Highest education level: 12th grade  Occupational History  . Occupation: retired  Tobacco Use  . Smoking status: Never Smoker  . Smokeless tobacco: Never Used  Vaping Use  . Vaping Use: Never used  Substance and Sexual Activity  . Alcohol use: No  . Drug use: No  . Sexual activity: Not on file  Other Topics Concern  . Not on file  Social History Narrative  . Not on file   Social Determinants of Health   Financial Resource Strain: Low Risk   . Difficulty of Paying Living Expenses: Not hard at all  Food Insecurity:  No Food Insecurity  . Worried About Running Out of Food in the Last Year: Never true  . Ran Out of Food in the Last Year: Never true  Transportation Needs: No Transportation Needs  . Lack of Transportation (Medical): No  .  Lack of Transportation (Non-Medical): No  Physical Activity: Inactive  . Days of Exercise per Week: 0 days  . Minutes of Exercise per Session: 0 min  Stress: No Stress Concern Present  . Feeling of Stress : Not at all  Social Connections: Moderately Isolated  . Frequency of Communication with Friends and Family: More than three times a week  . Frequency of Social Gatherings with Friends and Family: More than three times a week  . Attends Religious Services: 1 to 4 times per year  . Active Member of Clubs or Organizations: No  . Attends Club or Organization Meetings: Never  . Marital Status: Widowed  Intimate Partner Violence: Not At Risk  . Fear of Current or Ex-Partner: No  . Emotionally Abused: No  . Physically Abused: No  . Sexually Abused: No    FAMILY HISTORY: Family History  Problem Relation Age of Onset  . CAD Mother   . Heart attack Mother   . Heart disease Father   . Diabetes Cousin   . Cancer Cousin     ALLERGIES:  is allergic to sulfa antibiotics.  MEDICATIONS:  Current Outpatient Medications  Medication Sig Dispense Refill  . Acetaminophen (EQ ARTHRITIS PAIN PO) Take 1 tablet by mouth 2 (two) times daily. As needed    . allopurinol (ZYLOPRIM) 100 MG tablet Take 1 tablet (100 mg total) by mouth daily. 90 tablet 1  . atorvastatin (LIPITOR) 40 MG tablet TAKE 1 TABLET BY MOUTH EVERY DAY 90 tablet 2  . B Complex Vitamins (GNP VITAMIN B COMPLEX PO) Take by mouth daily.     . Blood Glucose Monitoring Suppl (ONETOUCH VERIO) w/Device KIT To check blood sugar once daily 1 kit 0  . escitalopram (LEXAPRO) 20 MG tablet TAKE 1 TABLET BY MOUTH EVERY DAY 90 tablet 0  . famotidine (PEPCID) 40 MG tablet Take 1 tablet (40 mg total) by mouth daily. 90 tablet 3  . ferrous sulfate 325 (65 FE) MG EC tablet Take 325 mg by mouth daily.    . furosemide (LASIX) 40 MG tablet Take 1 tablet (40 mg total) by mouth daily. 90 tablet 3  . glipiZIDE (GLUCOTROL XL) 10 MG 24 hr tablet Take 1  tablet (10 mg total) by mouth daily with breakfast. 90 tablet 3  . glucose blood (ONETOUCH VERIO) test strip To check blood sugar once daily 100 each 12  . Lancets (ONETOUCH ULTRASOFT) lancets To check blood sugar once daily 100 each 12  . levothyroxine (SYNTHROID) 75 MCG tablet Take 1 tablet (75 mcg total) by mouth daily. 90 tablet 3  . meloxicam (MOBIC) 7.5 MG tablet Take 1 tablet (7.5 mg total) by mouth daily. 30 tablet 0  . metFORMIN (GLUCOPHAGE) 1000 MG tablet Take 1 tablet (1,000 mg total) by mouth 2 (two) times daily with a meal. 180 tablet 3  . Misc Natural Products (TART CHERRY ADVANCED PO) Take 1 tablet by mouth daily.    . Polyethyl Glycol-Propyl Glycol (SYSTANE HYDRATION PF OP) Place 1 drop into both eyes daily as needed (for dry eyes).    . TURMERIC PO Take 1 capsule by mouth daily.    . Vitamin D, Ergocalciferol, (DRISDOL) 1.25 MG (50000 UNIT) CAPS capsule Take 1 capsule (50,000   Units total) by mouth every 7 (seven) days. 12 capsule 3  . zaleplon (SONATA) 10 MG capsule TAKE 1 CAPSULE (10 MG TOTAL) BY MOUTH AT BEDTIME AS NEEDED FOR SLEEP. 90 capsule 1  . azithromycin (ZITHROMAX) 250 MG tablet Take 2 tablets PO on day one, and one tablet PO daily thereafter until completed. 6 tablet 0  . benzonatate (TESSALON) 200 MG capsule Take 1 capsule (200 mg total) by mouth 3 (three) times daily as needed. 30 capsule 0  . colchicine 0.6 MG tablet Take 1 tablet (0.6 mg total) by mouth 2 (two) times daily as needed. (Patient not taking: Reported on 05/26/2020) 60 tablet 5   No current facility-administered medications for this visit.     PHYSICAL EXAMINATION: ECOG PERFORMANCE STATUS: 1 - Symptomatic but completely ambulatory Vitals:   05/26/20 1058  BP: 120/73  Pulse: 70  Temp: (!) 97.4 F (36.3 C)  SpO2: 98%   Filed Weights   05/26/20 1058  Weight: 192 lb 1.6 oz (87.1 kg)    Physical Exam Constitutional:      General: She is not in acute distress.    Comments: Patient walks  independently  HENT:     Head: Normocephalic and atraumatic.  Eyes:     General: No scleral icterus. Cardiovascular:     Rate and Rhythm: Normal rate and regular rhythm.     Heart sounds: Normal heart sounds.  Pulmonary:     Effort: Pulmonary effort is normal. No respiratory distress.     Breath sounds: No wheezing.  Abdominal:     General: Bowel sounds are normal. There is no distension.     Palpations: Abdomen is soft.  Musculoskeletal:        General: No deformity. Normal range of motion.     Cervical back: Normal range of motion and neck supple.  Skin:    General: Skin is warm and dry.     Findings: No erythema or rash.  Neurological:     Mental Status: She is alert and oriented to person, place, and time. Mental status is at baseline.     Cranial Nerves: No cranial nerve deficit.     Coordination: Coordination normal.  Psychiatric:        Mood and Affect: Mood normal.     CMP Latest Ref Rng & Units 05/26/2020  Glucose 70 - 99 mg/dL 141(H)  BUN 8 - 23 mg/dL 26(H)  Creatinine 0.44 - 1.00 mg/dL 2.06(H)  Sodium 135 - 145 mmol/L 139  Potassium 3.5 - 5.1 mmol/L 4.1  Chloride 98 - 111 mmol/L 102  CO2 22 - 32 mmol/L 25  Calcium 8.9 - 10.3 mg/dL 9.2  Total Protein 6.5 - 8.1 g/dL 6.9  Total Bilirubin 0.3 - 1.2 mg/dL 0.6  Alkaline Phos 38 - 126 U/L 71  AST 15 - 41 U/L 18  ALT 0 - 44 U/L 11   CBC Latest Ref Rng & Units 05/26/2020  WBC 4.0 - 10.5 K/uL 9.0  Hemoglobin 12.0 - 15.0 g/dL 11.9(L)  Hematocrit 36.0 - 46.0 % 36.0  Platelets 150 - 400 K/uL 263     RADIOGRAPHIC STUDIES: I have personally reviewed the radiological images as listed and agreed with the findings in the report. No results found.  LABORATORY DATA:  I have reviewed the data as listed Lab Results  Component Value Date   WBC 9.0 05/26/2020   HGB 11.9 (L) 05/26/2020   HCT 36.0 05/26/2020   MCV 99.2 05/26/2020   PLT 263   05/26/2020   Recent Labs    11/04/19 1027 04/27/20 0959 05/26/20 1013  NA  141 140 139  K 3.9 4.1 4.1  CL 105 102 102  CO2 23 20 25  GLUCOSE 129* 97 141*  BUN 28* 17 26*  CREATININE 1.89* 1.40* 2.06*  CALCIUM 9.3 9.5 9.2  GFRNONAA 25* 36* 24*  GFRAA 29* 42*  --   PROT 7.3 6.4 6.9  ALBUMIN 3.8 3.7 3.5  AST 23 13 18  ALT 14 10 11  ALKPHOS 51 78 71  BILITOT 0.7 0.4 0.6   Iron/TIBC/Ferritin/ %Sat    Component Value Date/Time   IRON 56 04/23/2017 1644   TIBC 362 04/23/2017 1644   FERRITIN 19 04/23/2017 1644   IRONPCTSAT 15 04/23/2017 1644        ASSESSMENT & PLAN:  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 CKD (chronic kidney disease) stage 4, GFR 15-29 ml/min (HCC)    #Monoclonal B-cell lymphocytosis of unknown significance Labs are reviewed and discussed with patient.  LDH is not elevated. Stable WBC and lymphocyte count.  #Worsening of kidney function. Patient has been using Mobic which I advised patient to stop.  Encourage oral hydration.  Avoid nephrotoxin. I will check multiple myeloma panel today.  #Very mild anemia, probably due to worsening of kidney function.    Orders Placed This Encounter  Procedures  . Kappa/lambda light chains    Standing Status:   Future    Number of Occurrences:   1    Standing Expiration Date:   05/26/2021  . Multiple Myeloma Panel (SPEP&IFE w/QIG)    Standing Status:   Future    Number of Occurrences:   1    Standing Expiration Date:   05/26/2021    All questions were answered. The patient knows to call the clinic with any problems questions or concerns.  Return of visit: 6 months \ Zhou Yu, MD, PhD Hematology Oncology [facility] Pager- [phone] 05/26/2020   

## 2020-05-27 LAB — KAPPA/LAMBDA LIGHT CHAINS
Kappa free light chain: 236.5 mg/L — ABNORMAL HIGH (ref 3.3–19.4)
Kappa, lambda light chain ratio: 5.04 — ABNORMAL HIGH (ref 0.26–1.65)
Lambda free light chains: 46.9 mg/L — ABNORMAL HIGH (ref 5.7–26.3)

## 2020-05-30 ENCOUNTER — Other Ambulatory Visit: Payer: Self-pay | Admitting: Oncology

## 2020-05-30 DIAGNOSIS — D472 Monoclonal gammopathy: Secondary | ICD-10-CM

## 2020-05-30 LAB — MULTIPLE MYELOMA PANEL, SERUM
Albumin SerPl Elph-Mcnc: 3.3 g/dL (ref 2.9–4.4)
Albumin/Glob SerPl: 1 (ref 0.7–1.7)
Alpha 1: 0.3 g/dL (ref 0.0–0.4)
Alpha2 Glob SerPl Elph-Mcnc: 1.2 g/dL — ABNORMAL HIGH (ref 0.4–1.0)
B-Globulin SerPl Elph-Mcnc: 1 g/dL (ref 0.7–1.3)
Gamma Glob SerPl Elph-Mcnc: 0.9 g/dL (ref 0.4–1.8)
Globulin, Total: 3.4 g/dL (ref 2.2–3.9)
IgA: 522 mg/dL — ABNORMAL HIGH (ref 64–422)
IgG (Immunoglobin G), Serum: 805 mg/dL (ref 586–1602)
IgM (Immunoglobulin M), Srm: 116 mg/dL (ref 26–217)
Total Protein ELP: 6.7 g/dL (ref 6.0–8.5)

## 2020-05-31 ENCOUNTER — Telehealth: Payer: Self-pay

## 2020-05-31 DIAGNOSIS — D472 Monoclonal gammopathy: Secondary | ICD-10-CM

## 2020-05-31 NOTE — Telephone Encounter (Signed)
Done.. Pt will RTC on 06/02/20 for  addition lab. Urine protein electrophoresis And pts sched 11/24/20 lab appt was moved 1 week per MD..

## 2020-05-31 NOTE — Telephone Encounter (Signed)
-----  Message from Earlie Server, MD sent at 05/30/2020 10:16 PM EST ----- Please ask her to do addition lab. Urine protein electrophoresis. Ordered.  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  I am happy to talk to her if she has questions now [virtual visit is fine]  or I will discuss in details during her next visit.    Please order multiple myeloma panel and free light chain ratio at next visit and have her do blood work 1 week earlier than MD visit.

## 2020-05-31 NOTE — Telephone Encounter (Signed)
Patient notified and request to be called to discuss appt date and time for labs.

## 2020-06-02 ENCOUNTER — Inpatient Hospital Stay: Payer: Medicare Other | Attending: Oncology

## 2020-06-02 DIAGNOSIS — Z79899 Other long term (current) drug therapy: Secondary | ICD-10-CM | POA: Diagnosis not present

## 2020-06-02 DIAGNOSIS — N184 Chronic kidney disease, stage 4 (severe): Secondary | ICD-10-CM | POA: Diagnosis not present

## 2020-06-02 DIAGNOSIS — D72829 Elevated white blood cell count, unspecified: Secondary | ICD-10-CM | POA: Insufficient documentation

## 2020-06-02 DIAGNOSIS — D472 Monoclonal gammopathy: Secondary | ICD-10-CM

## 2020-06-07 LAB — PROTEIN ELECTRO, RANDOM URINE
Albumin ELP, Urine: 25.5 %
Alpha-1-Globulin, U: 3 %
Alpha-2-Globulin, U: 5.9 %
Beta Globulin, U: 50.1 %
Gamma Globulin, U: 15.4 %
M Component, Ur: 36.7 % — ABNORMAL HIGH
Total Protein, Urine: 49.2 mg/dL

## 2020-06-09 ENCOUNTER — Other Ambulatory Visit: Payer: Self-pay | Admitting: Physician Assistant

## 2020-06-09 DIAGNOSIS — M545 Low back pain, unspecified: Secondary | ICD-10-CM

## 2020-06-09 DIAGNOSIS — G8929 Other chronic pain: Secondary | ICD-10-CM

## 2020-06-09 NOTE — Telephone Encounter (Signed)
Requested Prescriptions  Pending Prescriptions Disp Refills  . meloxicam (MOBIC) 7.5 MG tablet [Pharmacy Med Name: MELOXICAM 7.5 MG TABLET] 30 tablet 0    Sig: TAKE 1 TABLET BY MOUTH EVERY DAY     Analgesics:  COX2 Inhibitors Failed - 06/09/2020  1:22 AM      Failed - HGB in normal range and within 360 days    Hemoglobin  Date Value Ref Range Status  05/26/2020 11.9 (L) 12.0 - 15.0 g/dL Final  04/27/2020 12.5 11.1 - 15.9 g/dL Final         Failed - Cr in normal range and within 360 days    Creatinine, Ser  Date Value Ref Range Status  05/26/2020 2.06 (H) 0.44 - 1.00 mg/dL Final         Passed - Patient is not pregnant      Passed - Valid encounter within last 12 months    Recent Outpatient Visits          1 month ago Encounter for [employer] annual wellness exam   [place], McKenzie, LPN   1 month ago Acute non-recurrent pansinusitis   [facility] [place], [place], [place]   7 months ago Essential hypertension   [facility], Clearnce [place]rel, [place]   1 year ago Annual physical exam   [facility] [place], Clearnce [place], [place]   1 year ago Essential hypertension   [place], [place], [place]

## 2020-06-20 ENCOUNTER — Other Ambulatory Visit: Payer: Self-pay | Admitting: Physician Assistant

## 2020-06-20 DIAGNOSIS — F5101 Primary insomnia: Secondary | ICD-10-CM

## 2020-06-20 NOTE — Telephone Encounter (Signed)
Requested medication (s) are due for refill today: yes  Requested medication (s) are on the active medication list: yes  Last refill:  12/11/19  Future visit scheduled: no  Notes to clinic:  med not delegated to NT to RF   Requested Prescriptions  Pending Prescriptions Disp Refills   zaleplon (SONATA) 10 MG capsule [Pharmacy Med Name: ZALEPLON 10 MG CAPSULE] 90 capsule     Sig: TAKE 1 CAPSULE (10 MG TOTAL) BY MOUTH AT BEDTIME AS NEEDED FOR SLEEP.      Not Delegated - Psychiatry:  Anxiolytics/Hypnotics Failed - 06/20/2020  1:11 PM      Failed - This refill cannot be delegated      Failed - Urine Drug Screen completed in last 360 days      Passed - Valid encounter within last 6 months    Recent Outpatient Visits           1 month ago Encounter for [employer] annual wellness exam   [facility] [place] [facility], McKenzie, LPN   1 month ago Acute non-recurrent pansinusitis   [facility] [place], [place], [place]   8 months ago Essential hypertension   [place], Clearnce [place], [place]   1 year ago Annual physical exam   [facility] [place], [place]ce [place], [place]   1 year ago Essential hypertension   [place], [place], [place]

## 2020-06-27 ENCOUNTER — Other Ambulatory Visit: Payer: Self-pay | Admitting: Physician Assistant

## 2020-06-27 DIAGNOSIS — N1832 Chronic kidney disease, stage 3b: Secondary | ICD-10-CM

## 2020-06-27 DIAGNOSIS — E1122 Type 2 diabetes mellitus with diabetic chronic kidney disease: Secondary | ICD-10-CM

## 2020-07-22 ENCOUNTER — Other Ambulatory Visit: Payer: Self-pay | Admitting: Physician Assistant

## 2020-07-22 DIAGNOSIS — F3342 Major depressive disorder, recurrent, in full remission: Secondary | ICD-10-CM

## 2020-08-15 ENCOUNTER — Other Ambulatory Visit: Payer: Self-pay | Admitting: Family Medicine

## 2020-08-15 DIAGNOSIS — F3342 Major depressive disorder, recurrent, in full remission: Secondary | ICD-10-CM

## 2020-09-27 ENCOUNTER — Other Ambulatory Visit: Payer: Self-pay

## 2020-09-27 ENCOUNTER — Encounter: Payer: Self-pay | Admitting: Family Medicine

## 2020-09-27 ENCOUNTER — Ambulatory Visit (INDEPENDENT_AMBULATORY_CARE_PROVIDER_SITE_OTHER): Payer: Medicare Other | Admitting: Family Medicine

## 2020-09-27 VITALS — BP 117/81 | HR 86 | Temp 98.3°F | Resp 16 | Wt 184.9 lb

## 2020-09-27 DIAGNOSIS — Z6832 Body mass index (BMI) 32.0-32.9, adult: Secondary | ICD-10-CM

## 2020-09-27 DIAGNOSIS — F5101 Primary insomnia: Secondary | ICD-10-CM | POA: Diagnosis not present

## 2020-09-27 DIAGNOSIS — E1122 Type 2 diabetes mellitus with diabetic chronic kidney disease: Secondary | ICD-10-CM | POA: Diagnosis not present

## 2020-09-27 DIAGNOSIS — E6609 Other obesity due to excess calories: Secondary | ICD-10-CM

## 2020-09-27 DIAGNOSIS — E034 Atrophy of thyroid (acquired): Secondary | ICD-10-CM

## 2020-09-27 DIAGNOSIS — I152 Hypertension secondary to endocrine disorders: Secondary | ICD-10-CM

## 2020-09-27 DIAGNOSIS — E559 Vitamin D deficiency, unspecified: Secondary | ICD-10-CM

## 2020-09-27 DIAGNOSIS — E1159 Type 2 diabetes mellitus with other circulatory complications: Secondary | ICD-10-CM

## 2020-09-27 DIAGNOSIS — N1832 Chronic kidney disease, stage 3b: Secondary | ICD-10-CM

## 2020-09-27 DIAGNOSIS — R5383 Other fatigue: Secondary | ICD-10-CM

## 2020-09-27 LAB — POCT GLYCOSYLATED HEMOGLOBIN (HGB A1C): Hemoglobin A1C: 6.1 % — AB (ref 4.0–5.6)

## 2020-09-27 MED ORDER — METFORMIN HCL ER 500 MG PO TB24: 1000.0000 mg | ORAL_TABLET | Freq: Every day | ORAL | 1 refills | Status: DC

## 2020-09-27 NOTE — Patient Instructions (Signed)
Melatonin 5mg  nightly  Insomnia Insomnia is a sleep disorder that makes it difficult to fall asleep or stay asleep. Insomnia can cause fatigue, low energy, difficulty concentrating, moodswings, and poor performance at work or school. There are three different ways to classify insomnia: Difficulty falling asleep. Difficulty staying asleep. Waking up too early in the morning. Any type of insomnia can be long-term (chronic) or short-term (acute). Both are common. Short-term insomnia usually lasts for three months or less. Chronic insomnia occurs at least three times a week for longer than threemonths. What are the causes? Insomnia may be caused by another condition, situation, or substance, such as: Anxiety. Certain medicines. Gastroesophageal reflux disease (GERD) or other gastrointestinal conditions. Asthma or other breathing conditions. Restless legs syndrome, sleep apnea, or other sleep disorders. Chronic pain. Menopause. Stroke. Abuse of alcohol, tobacco, or illegal drugs. Mental health conditions, such as depression. Caffeine. Neurological disorders, such as Alzheimer's disease. An overactive thyroid (hyperthyroidism). Sometimes, the cause of insomnia may not be known. What increases the risk? Risk factors for insomnia include: Gender. Women are affected more often than men. Age. Insomnia is more common as you get older. Stress. Lack of exercise. Irregular work schedule or working night shifts. Traveling between different time zones. Certain medical and mental health conditions. What are the signs or symptoms? If you have insomnia, the main symptom is having trouble falling asleep or having trouble staying asleep. This may lead to other symptoms, such as: Feeling fatigued or having low energy. Feeling nervous about going to sleep. Not feeling rested in the morning. Having trouble concentrating. Feeling irritable, anxious, or depressed. How is this diagnosed? This  condition may be diagnosed based on: Your symptoms and medical history. Your health care provider may ask about: Your sleep habits. Any medical conditions you have. Your mental health. A physical exam. How is this treated? Treatment for insomnia depends on the cause. Treatment may focus on treating an underlying condition that is causing insomnia. Treatment may also include: Medicines to help you sleep. Counseling or therapy. Lifestyle adjustments to help you sleep better. Follow these instructions at home: Eating and drinking  Limit or avoid alcohol, caffeinated beverages, and cigarettes, especially close to bedtime. These can disrupt your sleep. Do not eat a large meal or eat spicy foods right before bedtime. This can lead to digestive discomfort that can make it hard for you to sleep.  Sleep habits  Keep a sleep diary to help you and your health care provider figure out what could be causing your insomnia. Write down: When you sleep. When you wake up during the night. How well you sleep. How rested you feel the next day. Any side effects of medicines you are taking. What you eat and drink. Make your bedroom a dark, comfortable place where it is easy to fall asleep. Put up shades or blackout curtains to block light from outside. Use a white noise machine to block noise. Keep the temperature cool. Limit screen use before bedtime. This includes: Watching TV. Using your smartphone, tablet, or computer. Stick to a routine that includes going to bed and waking up at the same times every day and night. This can help you fall asleep faster. Consider making a quiet activity, such as reading, part of your nighttime routine. Try to avoid taking naps during the day so that you sleep better at night. Get out of bed if you are still awake after 15 minutes of trying to sleep. Keep the lights down, but try reading  or doing a quiet activity. When you feel sleepy, go back to bed.  General  instructions Take over-the-counter and prescription medicines only as told by your health care provider. Exercise regularly, as told by your health care provider. Avoid exercise starting several hours before bedtime. Use relaxation techniques to manage stress. Ask your health care provider to suggest some techniques that may work well for you. These may include: Breathing exercises. Routines to release muscle tension. Visualizing peaceful scenes. Make sure that you drive carefully. Avoid driving if you feel very sleepy. Keep all follow-up visits as told by your health care provider. This is important. Contact a health care provider if: You are tired throughout the day. You have trouble in your daily routine due to sleepiness. You continue to have sleep problems, or your sleep problems get worse. Get help right away if: You have serious thoughts about hurting yourself or someone else. If you ever feel like you may hurt yourself or others, or have thoughts about taking your own life, get help right away. You can go to your nearest emergency department or call: Your local emergency services (911 in the [place] A suicide crisis helpline, such as the [place] at 2035609172. This is open 24 hours a day. Summary Insomnia is a sleep disorder that makes it difficult to fall asleep or stay asleep. Insomnia can be long-term (chronic) or short-term (acute). Treatment for insomnia depends on the cause. Treatment may focus on treating an underlying condition that is causing insomnia. Keep a sleep diary to help you and your health care provider figure out what could be causing your insomnia. This information is not intended to replace advice given to you by your health care provider. Make sure you discuss any questions you have with your healthcare provider. Document Revised: 01/28/2020 Document Reviewed: 01/28/2020 Elsevier Patient Education  2022 [employer].

## 2020-09-27 NOTE — Assessment & Plan Note (Signed)
Previously well controlled Continue Synthroid at current dose  Recheck TSH and adjust Synthroid as indicated   

## 2020-09-27 NOTE — Progress Notes (Signed)
Established patient visit   Patient: Donna Malone   DOB: 03/08/1946   75 y.o. Female  MRN: [id number] Visit Date: 09/27/2020  Today's healthcare provider: Lavon Paganini, MD   Chief Complaint  Patient presents with   Diabetes   Hypothyroidism   Depression   Insomnia   Subjective    Diabetes Pertinent negatives for hypoglycemia include no dizziness or headaches. Pertinent negatives for diabetes include no chest pain, no fatigue and no weakness.  Depression        Associated symptoms include insomnia.  Associated symptoms include no fatigue, no myalgias and no headaches. Insomnia PMH includes: depression.     Diabetes Mellitus Type II, Follow-up  Lab Results  Component Value Date   HGBA1C 6.1 (A) 09/27/2020   HGBA1C 6.5 (H) 04/27/2020   HGBA1C 6.6 (A) 10/22/2019   Wt Readings from Last 3 Encounters:  09/27/20 184 lb 14.4 oz (83.9 kg)  05/26/20 192 lb 1.6 oz (87.1 kg)  04/25/20 197 lb (89.4 kg)   Last seen for diabetes 5 months ago.  Management since then includes none continue Glipizide and Metformin. She is experiencing lose stool due to the Metformin. Otherwise tolerating Glipizide. She reports excellent compliance with treatment. She is having side effects.   Symptoms: Yes fatigue No foot ulcerations  Yes appetite changes No nausea  No paresthesia of the feet  Yes polydipsia  No polyuria No visual disturbances   No vomiting    Home blood sugar records:  120-200  Episodes of hypoglycemia? Yes patient reports feeling shaky and states that she feels like she is having a " hot flash".   Current insulin regiment: none Most Recent Eye Exam: 11/09/2019 Current exercise: no regular exercise Current diet habits: in general, an "unhealthy" diet  Pertinent Labs: Lab Results  Component Value Date   CHOL 194 04/27/2020   HDL 33 (L) 04/27/2020   LDLCALC 103 (H) 04/27/2020   TRIG 340 (H) 04/27/2020   CHOLHDL 4.2 04/20/2019   Lab Results  Component  Value Date   NA 139 05/26/2020   K 4.1 05/26/2020   CREATININE 2.06 (H) 05/26/2020   GFRNONAA 24 (L) 05/26/2020   GFRAA 42 (L) 04/27/2020   GLUCOSE 141 (H) 05/26/2020     ---------------------------------------------------------------------------------------------------  Hypothyroid, follow-up  Lab Results  Component Value Date   TSH 2.320 04/27/2020   TSH 1.620 04/20/2019   TSH 0.908 01/02/2019   Wt Readings from Last 3 Encounters:  09/27/20 184 lb 14.4 oz (83.9 kg)  05/26/20 192 lb 1.6 oz (87.1 kg)  04/25/20 197 lb (89.4 kg)   She was last seen for hypothyroid 5 months ago.  Management since that visit includes none, continue Levothyroxine 34mg. She reports excellent compliance with treatment. She is not having side effects.   Symptoms: Yes change in energy level No constipation  Yes diarrhea No heat / cold intolerance  No nervousness No palpitations  Yes weight changes    -----------------------------------------------------------------------------------------  Lipid/Cholesterol, Follow-up  Last lipid panel Other pertinent labs  Lab Results  Component Value Date   CHOL 194 04/27/2020   HDL 33 (L) 04/27/2020   LDLCALC 103 (H) 04/27/2020   TRIG 340 (H) 04/27/2020   CHOLHDL 4.2 04/20/2019   Lab Results  Component Value Date   ALT 11 05/26/2020   AST 18 05/26/2020   PLT 263 05/26/2020   TSH 2.320 04/27/2020     She was last seen for this 11 months ago.  Management since that  visit includes none, well controlled on statin.  She reports good compliance with treatment. She is not having side effects.   Symptoms: No chest pain No chest pressure/discomfort  No dyspnea No lower extremity edema  No numbness or tingling of extremity No orthopnea  No palpitations No paroxysmal nocturnal dyspnea  No speech difficulty No syncope   Current diet: in general, an "unhealthy" diet Current exercise: no regular exercise  The 10-year ASCVD risk score Mikey Bussing DC Jr., et  al., 2013) is: 36.6%  ---------------------------------------------------------------------------------------------------  Depression, Follow-up  She  was last seen for this 11 months ago. Changes made at last visit include none, discussed changing Lexapro to a different medicine but patient declined.   She reports excellent compliance with treatment. She is not having side effects.   She reports good tolerance of treatment. Current symptoms include: depressed mood, fatigue, and insomnia She feels she is Worse since last visit.  Depression screen [facility] 2/9 09/27/2020 04/26/2020 10/22/2019  Decreased Interest _0 Down, Depressed, Hopeless _1 PHQ - 2 Score _2 Altered sleeping _3 Tired, decreased energy _4 Change in appetite _5 Feeling bad or failure about yourself  0 0 1  Trouble concentrating 1 0 1  Moving slowly or fidgety/restless 0 0 1  Suicidal thoughts 0 0 0  PHQ-9 Score _6 Difficult doing work/chores Extremely dIfficult Very difficult -    -----------------------------------------------------------------------------------------  Follow up for Insomnia  The patient was last seen for this 11 months ago. Changes made at last visit include none, discussed trying [place] but patient declined.  She reports fair compliance with treatment. She feels that condition is Worse. Patient would like to discuss today changing medication, she is currently on Zaleplon and states that it is not helping control symptom. She is not having side effects.  She has tried Quarry manager but her symptoms didn't improve.  She reports waking up fine but her sleep isn't fulfilling.  Fatigue She reports she is tired all the time and it may be associated with her insomnia.   -----------------------------------------------------------------------------------------  Patient Active Problem List   Diagnosis Date Noted   Right arm pain 10/22/2019   Degenerative arthritis of knee,  bilateral 05/13/2018   Pes planus 05/13/2018   Bronchitis 09/19/2015   Hypertension associated with diabetes ([place]) 04/01/2015   Leukocytosis 12/22/2014   Abnormal finding on mammography 09/21/2014   Hypothyroidism 09/21/2014   Obesity 09/21/2014   Chronic kidney disease, stage III (moderate) ([place]) 09/21/2014   Type 2 diabetes mellitus with stage 3b chronic kidney disease, without long-term current use of insulin ([place]) 09/21/2014   Depressive disorder 09/21/2014   Accumulation of fluid in tissues 09/21/2014   Esophageal reflux 09/21/2014   Gout 09/21/2014   Hypercholesteremia 09/21/2014   Insomnia 09/21/2014   Vitamin D deficiency, unspecified 09/21/2014   Low back pain 01/15/2014   Other chest pain 01/15/2014   Atrophic vaginitis 01/15/2014   Past Medical History:  Diagnosis Date   Asthma    Bone spur    heel   Depression    Gout    Hypertension                            Allergies  Allergen Reactions   Sulfa Antibiotics    Medications: Outpatient Medications Prior to Visit  Medication Sig   Acetaminophen (EQ ARTHRITIS PAIN PO) Take  1 tablet by mouth 2 (two) times daily. As needed   allopurinol (ZYLOPRIM) 100 MG tablet Take 1 tablet (100 mg total) by mouth daily.   atorvastatin (LIPITOR) 40 MG tablet TAKE 1 TABLET BY MOUTH EVERY DAY   B Complex Vitamins (GNP VITAMIN B COMPLEX PO) Take by mouth daily.    Blood Glucose Monitoring Suppl (ONETOUCH VERIO) w/Device KIT To check blood sugar once daily   escitalopram (LEXAPRO) 20 MG tablet TAKE 1 TABLET BY MOUTH EVERY DAY   famotidine (PEPCID) 40 MG tablet Take 1 tablet (40 mg total) by mouth daily.   ferrous sulfate 325 (65 FE) MG EC tablet Take 325 mg by mouth daily.   furosemide (LASIX) 40 MG tablet Take 1 tablet (40 mg total) by mouth daily.   glipiZIDE (GLUCOTROL XL) 10 MG 24 hr tablet Take 1 tablet (10 mg total) by mouth daily with breakfast.   Lancets (ONETOUCH ULTRASOFT) lancets To check blood sugar once daily    levothyroxine (SYNTHROID) 75 MCG tablet Take 1 tablet (75 mcg total) by mouth daily.   ONETOUCH VERIO test strip TO CHECK BLOOD SUGAR ONCE DAILY   Polyethyl Glycol-Propyl Glycol (SYSTANE HYDRATION PF OP) Place 1 drop into both eyes daily as needed (for dry eyes).   Vitamin D, Ergocalciferol, (DRISDOL) 1.25 MG (50000 UNIT) CAPS capsule Take 1 capsule (50,000 Units total) by mouth every 7 (seven) days.   [DISCONTINUED] metFORMIN (GLUCOPHAGE) 1000 MG tablet Take 1 tablet (1,000 mg total) by mouth 2 (two) times daily with a meal.   [DISCONTINUED] azithromycin (ZITHROMAX) 250 MG tablet Take 2 tablets PO on day one, and one tablet PO daily thereafter until completed.   [DISCONTINUED] colchicine 0.6 MG tablet Take 1 tablet (0.6 mg total) by mouth 2 (two) times daily as needed. (Patient not taking: Reported on 05/26/2020)   [DISCONTINUED] meloxicam (MOBIC) 7.5 MG tablet TAKE 1 TABLET BY MOUTH EVERY DAY   [DISCONTINUED] Misc Natural Products (TART CHERRY ADVANCED PO) Take 1 tablet by mouth daily.   [DISCONTINUED] TURMERIC PO Take 1 capsule by mouth daily.   [DISCONTINUED] zaleplon (SONATA) 10 MG capsule TAKE 1 CAPSULE (10 MG TOTAL) BY MOUTH AT BEDTIME AS NEEDED FOR SLEEP.   No facility-administered medications prior to visit.   Review of Systems  Constitutional:  Negative for chills, fatigue and fever.  HENT:  Negative for ear pain, sinus pressure, sinus pain and sore throat.   Eyes:  Negative for pain and visual disturbance.  Respiratory:  Negative for cough, chest tightness, shortness of breath and wheezing.   Cardiovascular:  Negative for chest pain, palpitations and leg swelling.  Gastrointestinal:  Negative for abdominal pain, blood in stool, diarrhea, nausea and vomiting.  Genitourinary:  Negative for flank pain, frequency, pelvic pain and urgency.  Musculoskeletal:  Negative for back pain, myalgias and neck pain.  Neurological:  Negative for dizziness, weakness, light-headedness, numbness and  headaches.  Psychiatric/Behavioral:  Positive for depression. The patient has insomnia.       Objective    BP 117/81   Pulse 86   Temp 98.3 F (36.8 C) (Oral)   Resp 16   Wt 184 lb 14.4 oz (83.9 kg)   SpO2 100%   BMI 32.75 kg/m     Physical Exam Vitals reviewed.  Constitutional:      General: She is not in acute distress.    Appearance: Normal appearance. She is well-developed. She is not diaphoretic.  HENT:     Head: Normocephalic and atraumatic.  Eyes:  General: No scleral icterus.    Conjunctiva/sclera: Conjunctivae normal.  Neck:     Thyroid: No thyromegaly.  Cardiovascular:     Rate and Rhythm: Normal rate and regular rhythm.     Pulses: Normal pulses.     Heart sounds: Murmur heard.  Pulmonary:     Effort: Pulmonary effort is normal. No respiratory distress.     Breath sounds: Normal breath sounds. No wheezing, rhonchi or rales.  Musculoskeletal:     Cervical back: Neck supple.     Right lower leg: No edema.     Left lower leg: No edema.  Feet:     Comments: Diabetic Foot Exam - Simple   Simple Foot Form Diabetic Foot exam was performed with the following findings: Yes  09/27/2020 11:52 AM  Visual Inspection No deformities, no ulcerations, no other skin breakdown bilaterally: Yes Sensation Testing Intact to touch and monofilament testing bilaterally: Yes Pulse Check Posterior Tibialis and Dorsalis pulse intact bilaterally: Yes Comments    Lymphadenopathy:     Cervical: No cervical adenopathy.  Skin:    General: Skin is warm and dry.     Findings: No rash.  Neurological:     Mental Status: She is alert and oriented to person, place, and time. Mental status is at baseline.  Psychiatric:        Mood and Affect: Mood normal.        Behavior: Behavior normal.    Results for orders placed or performed in visit on 09/27/20  POCT glycosylated hemoglobin (Hb A1C)  Result Value Ref Range   Hemoglobin A1C 6.1 (A) 4.0 - 5.6 %   HbA1c POC (<> result,  manual entry)     HbA1c, POC (prediabetic range)     HbA1c, POC (controlled diabetic range)      Assessment & Plan     Problem List Items Addressed This Visit       Cardiovascular and Mediastinum   Hypertension associated with diabetes ([place])   Relevant Medications   metFORMIN (GLUCOPHAGE XR) 500 MG 24 hr tablet   Other Relevant Orders   Comprehensive metabolic panel     Endocrine   Hypothyroidism    Previously well controlled Continue Synthroid at current dose  Recheck TSH and adjust Synthroid as indicated         Relevant Orders   TSH   Type 2 diabetes mellitus with stage 3b chronic kidney disease, without long-term current use of insulin ([place]) - Primary    Well controlled Continue current meds, except for changing Metformin to XR to help with diarrhea SE Foot exam today F/u in 39mand repeat A1c       Relevant Medications   metFORMIN (GLUCOPHAGE XR) 500 MG 24 hr tablet   Other Relevant Orders   POCT glycosylated hemoglobin (Hb A1C) (Completed)     Genitourinary   Chronic kidney disease, stage III (moderate) ([facility])    contineu to monitor Cr Renally dose meds as needed Avoid NSAIDs       Relevant Orders   Comprehensive metabolic panel   CBC w/Diff/Platelet     Other   Obesity    Discussed importance of healthy weight management Discussed diet and exercise        Relevant Medications   metFORMIN (GLUCOPHAGE XR) 500 MG 24 hr tablet   Insomnia    Longstanding Failed trazodone and sonata Trial of melatonin Consider referral to sleep medicine       Vitamin D deficiency, unspecified  Relevant Orders   VITAMIN D 25 Hydroxy (Vit-D Deficiency, Fractures)   Other Visit Diagnoses     Fatigue, unspecified type       Relevant Orders   Comprehensive metabolic panel   CBC w/Diff/Platelet   TSH   VITAMIN D 25 Hydroxy (Vit-D Deficiency, Fractures)      Return in about 6 months (around 03/29/2021) for CPE, AWV, call back in 1 month to schedule with new  PCP.      I,Essence Turner,acting as a Education administrator for Lavon Paganini, MD.,have documented all relevant documentation on the behalf of Lavon Paganini, MD,as directed by  Lavon Paganini, MD while in the presence of Lavon Paganini, MD.  I, Lavon Paganini, MD, have reviewed all documentation for this visit. The documentation on 09/27/20 for the exam, diagnosis, procedures, and orders are all accurate and complete.   Joani Cosma, Dionne Bucy, MD, MPH [facility]

## 2020-09-27 NOTE — Assessment & Plan Note (Signed)
Longstanding Failed trazodone and sonata Trial of melatonin Consider referral to sleep medicine

## 2020-09-27 NOTE — Assessment & Plan Note (Signed)
contineu to monitor Cr Renally dose meds as needed Avoid NSAIDs

## 2020-09-27 NOTE — Assessment & Plan Note (Signed)
Well controlled Continue current meds, except for changing Metformin to XR to help with diarrhea SE Foot exam today F/u in 8m and repeat A1c

## 2020-09-27 NOTE — Assessment & Plan Note (Signed)
Discussed importance of healthy weight management Discussed diet and exercise  

## 2020-09-29 ENCOUNTER — Telehealth: Payer: Self-pay

## 2020-09-29 LAB — CBC WITH DIFFERENTIAL/PLATELET
Basophils Absolute: 0.1 10*3/uL (ref 0.0–0.2)
Basos: 1 %
EOS (ABSOLUTE): 0.4 10*3/uL (ref 0.0–0.4)
Eos: 3 %
Hematocrit: 35.8 % (ref 34.0–46.6)
Hemoglobin: 12.6 g/dL (ref 11.1–15.9)
Immature Grans (Abs): 0 10*3/uL (ref 0.0–0.1)
Immature Granulocytes: 0 %
Lymphocytes Absolute: 4.1 10*3/uL — ABNORMAL HIGH (ref 0.7–3.1)
Lymphs: 38 %
MCH: 33.1 pg — ABNORMAL HIGH (ref 26.6–33.0)
MCHC: 35.2 g/dL (ref 31.5–35.7)
MCV: 94 fL (ref 79–97)
Monocytes Absolute: 0.8 10*3/uL (ref 0.1–0.9)
Monocytes: 7 %
Neutrophils Absolute: 5.4 10*3/uL (ref 1.4–7.0)
Neutrophils: 51 %
Platelets: 303 10*3/uL (ref 150–450)
RBC: 3.81 x10E6/uL (ref 3.77–5.28)
RDW: 12.6 % (ref 11.7–15.4)
WBC: 10.8 10*3/uL (ref 3.4–10.8)

## 2020-09-29 LAB — COMPREHENSIVE METABOLIC PANEL
ALT: 17 IU/L (ref 0–32)
AST: 18 IU/L (ref 0–40)
Albumin/Globulin Ratio: 1.6 (ref 1.2–2.2)
Albumin: 4.3 g/dL (ref 3.7–4.7)
Alkaline Phosphatase: 100 IU/L (ref 44–121)
BUN/Creatinine Ratio: 18 (ref 12–28)
BUN: 34 mg/dL — ABNORMAL HIGH (ref 8–27)
Bilirubin Total: 0.4 mg/dL (ref 0.0–1.2)
CO2: 22 mmol/L (ref 20–29)
Calcium: 11.6 mg/dL — ABNORMAL HIGH (ref 8.7–10.3)
Chloride: 97 mmol/L (ref 96–106)
Creatinine, Ser: 1.85 mg/dL — ABNORMAL HIGH (ref 0.57–1.00)
Globulin, Total: 2.7 g/dL (ref 1.5–4.5)
Glucose: 130 mg/dL — ABNORMAL HIGH (ref 65–99)
Potassium: 4 mmol/L (ref 3.5–5.2)
Sodium: 138 mmol/L (ref 134–144)
Total Protein: 7 g/dL (ref 6.0–8.5)
eGFR: 28 mL/min/{1.73_m2} — ABNORMAL LOW (ref >59)

## 2020-09-29 LAB — TSH: TSH: 0.99 u[IU]/mL (ref 0.450–4.500)

## 2020-09-29 LAB — VITAMIN D 25 HYDROXY (VIT D DEFICIENCY, FRACTURES): Vit D, 25-Hydroxy: 81.9 ng/mL (ref 30.0–100.0)

## 2020-09-29 NOTE — Telephone Encounter (Signed)
Patient advised. Future labs order for her to come and repeat labs in a month.

## 2020-09-29 NOTE — Telephone Encounter (Signed)
-----   Message from Virginia Crews, MD sent at 09/29/2020  8:05 AM EDT ----- Normal/stable labs, except for high calcium. Recommend holding any Calcium supplement and repeat in 1 month

## 2020-10-29 LAB — COMPREHENSIVE METABOLIC PANEL
ALT: 8 IU/L (ref 0–32)
AST: 11 IU/L (ref 0–40)
Albumin/Globulin Ratio: 1.5 (ref 1.2–2.2)
Albumin: 4 g/dL (ref 3.7–4.7)
Alkaline Phosphatase: 92 IU/L (ref 44–121)
BUN/Creatinine Ratio: 16 (ref 12–28)
BUN: 25 mg/dL (ref 8–27)
Bilirubin Total: 0.3 mg/dL (ref 0.0–1.2)
CO2: 19 mmol/L — ABNORMAL LOW (ref 20–29)
Calcium: 9.6 mg/dL (ref 8.7–10.3)
Chloride: 101 mmol/L (ref 96–106)
Creatinine, Ser: 1.55 mg/dL — ABNORMAL HIGH (ref 0.57–1.00)
Globulin, Total: 2.7 g/dL (ref 1.5–4.5)
Glucose: 198 mg/dL — ABNORMAL HIGH (ref 65–99)
Potassium: 4 mmol/L (ref 3.5–5.2)
Sodium: 140 mmol/L (ref 134–144)
Total Protein: 6.7 g/dL (ref 6.0–8.5)
eGFR: 34 mL/min/{1.73_m2} — ABNORMAL LOW (ref >59)

## 2020-11-17 ENCOUNTER — Inpatient Hospital Stay: Payer: Medicare Other | Attending: Oncology

## 2020-11-17 ENCOUNTER — Other Ambulatory Visit: Payer: Self-pay

## 2020-11-17 DIAGNOSIS — I129 Hypertensive chronic kidney disease with stage 1 through stage 4 chronic kidney disease, or unspecified chronic kidney disease: Secondary | ICD-10-CM | POA: Diagnosis not present

## 2020-11-17 DIAGNOSIS — D472 Monoclonal gammopathy: Secondary | ICD-10-CM

## 2020-11-17 DIAGNOSIS — N183 Chronic kidney disease, stage 3 unspecified: Secondary | ICD-10-CM | POA: Diagnosis not present

## 2020-11-17 DIAGNOSIS — D72820 Lymphocytosis (symptomatic): Secondary | ICD-10-CM | POA: Diagnosis present

## 2020-11-17 DIAGNOSIS — D7589 Other specified diseases of blood and blood-forming organs: Secondary | ICD-10-CM | POA: Diagnosis not present

## 2020-11-17 DIAGNOSIS — D649 Anemia, unspecified: Secondary | ICD-10-CM | POA: Diagnosis not present

## 2020-11-17 LAB — COMPREHENSIVE METABOLIC PANEL
ALT: 11 U/L (ref 0–44)
AST: 19 U/L (ref 15–41)
Albumin: 3.3 g/dL — ABNORMAL LOW (ref 3.5–5.0)
Alkaline Phosphatase: 70 U/L (ref 38–126)
Anion gap: 9 (ref 5–15)
BUN: 21 mg/dL (ref 8–23)
CO2: 30 mmol/L (ref 22–32)
Calcium: 9 mg/dL (ref 8.9–10.3)
Chloride: 97 mmol/L — ABNORMAL LOW (ref 98–111)
Creatinine, Ser: 1.54 mg/dL — ABNORMAL HIGH (ref 0.44–1.00)
GFR, Estimated: 35 mL/min — ABNORMAL LOW (ref >60)
Glucose, Bld: 263 mg/dL — ABNORMAL HIGH (ref 70–99)
Potassium: 4 mmol/L (ref 3.5–5.1)
Sodium: 136 mmol/L (ref 135–145)
Total Bilirubin: 0.7 mg/dL (ref 0.3–1.2)
Total Protein: 6.8 g/dL (ref 6.5–8.1)

## 2020-11-17 LAB — CBC WITH DIFFERENTIAL/PLATELET
Abs Immature Granulocytes: 0.02 10*3/uL (ref 0.00–0.07)
Basophils Absolute: 0.1 10*3/uL (ref 0.0–0.1)
Basophils Relative: 1 %
Eosinophils Absolute: 0.4 10*3/uL (ref 0.0–0.5)
Eosinophils Relative: 5 %
HCT: 33.1 % — ABNORMAL LOW (ref 36.0–46.0)
Hemoglobin: 11 g/dL — ABNORMAL LOW (ref 12.0–15.0)
Immature Granulocytes: 0 %
Lymphocytes Relative: 35 %
Lymphs Abs: 2.5 10*3/uL (ref 0.7–4.0)
MCH: 34.4 pg — ABNORMAL HIGH (ref 26.0–34.0)
MCHC: 33.2 g/dL (ref 30.0–36.0)
MCV: 103.4 fL — ABNORMAL HIGH (ref 80.0–100.0)
Monocytes Absolute: 0.6 10*3/uL (ref 0.1–1.0)
Monocytes Relative: 9 %
Neutro Abs: 3.7 10*3/uL (ref 1.7–7.7)
Neutrophils Relative %: 50 %
Platelets: 226 10*3/uL (ref 150–400)
RBC: 3.2 MIL/uL — ABNORMAL LOW (ref 3.87–5.11)
RDW: 13.6 % (ref 11.5–15.5)
WBC: 7.2 10*3/uL (ref 4.0–10.5)
nRBC: 0 % (ref 0.0–0.2)

## 2020-11-20 DIAGNOSIS — D72820 Lymphocytosis (symptomatic): Secondary | ICD-10-CM | POA: Diagnosis not present

## 2020-11-21 ENCOUNTER — Other Ambulatory Visit: Payer: Self-pay | Admitting: Oncology

## 2020-11-21 ENCOUNTER — Other Ambulatory Visit: Payer: Self-pay

## 2020-11-21 DIAGNOSIS — D472 Monoclonal gammopathy: Secondary | ICD-10-CM

## 2020-11-22 LAB — MULTIPLE MYELOMA PANEL, SERUM
Albumin SerPl Elph-Mcnc: 3.1 g/dL (ref 2.9–4.4)
Albumin/Glob SerPl: 1.2 (ref 0.7–1.7)
Alpha 1: 0.2 g/dL (ref 0.0–0.4)
Alpha2 Glob SerPl Elph-Mcnc: 1.3 g/dL — ABNORMAL HIGH (ref 0.4–1.0)
B-Globulin SerPl Elph-Mcnc: 0.8 g/dL (ref 0.7–1.3)
Gamma Glob SerPl Elph-Mcnc: 0.5 g/dL (ref 0.4–1.8)
Globulin, Total: 2.8 g/dL (ref 2.2–3.9)
IgA: 521 mg/dL — ABNORMAL HIGH (ref 64–422)
IgG (Immunoglobin G), Serum: 646 mg/dL (ref 586–1602)
IgM (Immunoglobulin M), Srm: 114 mg/dL (ref 26–217)
Total Protein ELP: 5.9 g/dL — ABNORMAL LOW (ref 6.0–8.5)

## 2020-11-22 LAB — UPEP/TP, 24-HR URINE
Albumin, U: 18.7 %
Alpha 1, Urine: 3.2 %
Alpha 2, Urine: 2.9 %
Beta, Urine: 68.1 %
Gamma Globulin, Urine: 7 %
M-Spike, mg/24 hr: 130.3 mg/24 hr — ABNORMAL HIGH
M-spike, %: 55.6 % — ABNORMAL HIGH
Total Protein, Urine-Ur/day: 234 mg/24 hr — ABNORMAL HIGH (ref 30–150)
Total Protein, Urine: 29.3 mg/dL
Total Volume: 800

## 2020-11-24 ENCOUNTER — Inpatient Hospital Stay: Payer: Medicare Other | Admitting: Nurse Practitioner

## 2020-11-24 ENCOUNTER — Other Ambulatory Visit: Payer: Medicare Other

## 2020-11-24 ENCOUNTER — Encounter: Payer: Self-pay | Admitting: Nurse Practitioner

## 2020-11-24 VITALS — BP 128/73 | HR 70 | Temp 97.0°F | Resp 18 | Ht 63.0 in | Wt 185.6 lb

## 2020-11-24 DIAGNOSIS — D7589 Other specified diseases of blood and blood-forming organs: Secondary | ICD-10-CM

## 2020-11-24 DIAGNOSIS — D72820 Lymphocytosis (symptomatic): Secondary | ICD-10-CM

## 2020-11-24 DIAGNOSIS — D472 Monoclonal gammopathy: Secondary | ICD-10-CM | POA: Diagnosis not present

## 2020-11-24 NOTE — Progress Notes (Signed)
Hematology/Oncology Consult note [facility] Telephone:([phone] [phone]   Patient Care Team: [name], [name] as PCP - General (Family Medicine) Lorelee Cover., MD as Consulting Physician (Ophthalmology) Lovell Sheehan, MD as Consulting Physician (Orthopedic Surgery) Murlean Iba, MD (Nephrology) Earlie Server, MD as Consulting Physician (Oncology)  REFERRING PROVIDER: [name], [name]   CHIEF COMPLAINTS/REASON FOR VISIT:  Follow up for lymphocytosis and mgus  HISTORY OF PRESENTING ILLNESS:  Donna Malone is a  75 y.o.  female with PMH listed below who was referred for evaluation of leukocytosis Labs from PCP were reviewed:  - 10/22/2019 CBC showed elevated white count of 11.3, predominantly lymphocytosis.  Normal hemoglobin and platelet count. Leukocytosis was chronic since January 2021. Symptomatic with fatigue. No weight loss, fevers, chills, night sweats. Never smoker. No recent steroid injections, infections, or history of autoimmune disease. No chronic wounds or prosthesis. Has chronic knee arthritis.   Flow cytometry showed CD5+ , CD23 + B-cell population.  CLL/SLL phenotype.  CD38 negative, <1% leukocytes,<5000 cells/ul  INTERVAL HISTORY Donna Malone is a 75 y.o. 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  She continues to grieve the loss of her husband in January.  Appetite is overall reduced as she does not feel like cooking.  Denies fevers, chills, night sweats, unintentional weight loss.  She continues to feel fatigued.  Review of Systems  Constitutional:  Positive for fatigue. Negative for appetite change, chills and fever.  HENT:   Negative for hearing loss and voice change.   Eyes:  Negative for eye problems.  Respiratory:  Negative for chest tightness and cough.   Cardiovascular:  Negative for chest pain.  Gastrointestinal:   Negative for abdominal distention, abdominal pain and blood in stool.  Endocrine: Negative for hot flashes.  Genitourinary:  Negative for difficulty urinating and frequency.   Musculoskeletal:  Positive for arthralgias.  Skin:  Negative for itching and rash.  Neurological:  Negative for extremity weakness.  Hematological:  Negative for adenopathy.  Psychiatric/Behavioral:  Negative for confusion.     MEDICAL HISTORY:  Past Medical History:  Diagnosis Date   Asthma    Bone spur    heel   Depression    Gout    Hypertension     SURGICAL HISTORY: Past Surgical History:  Procedure Laterality Date   ABDOMINAL HYSTERECTOMY  04/1970   partial   CHOLECYSTECTOMY  late 1990's    SOCIAL HISTORY: Social History   Socioeconomic History   Marital status: Widowed    Spouse name: [name]   Number of children: 3   Years of education: Not on file   Highest education level: 12th grade  Occupational History   Occupation: retired  Tobacco Use   Smoking status: Never   Smokeless tobacco: Never  Vaping Use   Vaping Use: Never used  Substance and Sexual Activity   Alcohol use: No   Drug use: No   Sexual activity: Not on file  Other Topics Concern   Not on file  Social History Narrative   Not on file   Social Determinants of Health   Financial Resource Strain: Low Risk    Difficulty of Paying Living Expenses: Not hard at all  Food Insecurity: No Food Insecurity   Worried About Charity fundraiser in the Last Year: Never true   [place] in the Last Year: Never true  Transportation Needs: No Data processing manager (Medical): No   Lack of Transportation (Non-Medical): No  Physical Activity: Inactive   Days of Exercise per Week: 0 days   Minutes of Exercise per Session: 0 min  Stress: No Stress Concern Present   Feeling of Stress : Not at all  Social Connections: Moderately Isolated   Frequency of Communication with Friends and Family: More than  three times a week   Frequency of Social Gatherings with Friends and Family: More than three times a week   Attends Religious Services: 1 to 4 times per year   Active Member of [employer] or Organizations: No   Attends Archivist Meetings: Never   Marital Status: Widowed  Human resources officer Violence: Not At Risk   Fear of Current or Ex-Partner: No   Emotionally Abused: No   Physically Abused: No   Sexually Abused: No    FAMILY HISTORY: Family History  Problem Relation Age of Onset   CAD Mother    Heart attack Mother    Heart disease Father    Diabetes Cousin    Cancer Cousin     ALLERGIES:  is allergic to sulfa antibiotics.  MEDICATIONS:  Current Outpatient Medications  Medication Sig Dispense Refill   Melatonin 10 MG CAPS Take 1 capsule by mouth at bedtime as needed.     Acetaminophen (EQ ARTHRITIS PAIN PO) Take 1 tablet by mouth 2 (two) times daily. As needed     allopurinol (ZYLOPRIM) 100 MG tablet Take 1 tablet (100 mg total) by mouth daily. 90 tablet 1   atorvastatin (LIPITOR) 40 MG tablet TAKE 1 TABLET BY MOUTH EVERY DAY 90 tablet 2   B Complex Vitamins (GNP VITAMIN B COMPLEX PO) Take by mouth daily.      Blood Glucose Monitoring Suppl (ONETOUCH VERIO) w/Device KIT To check blood sugar once daily 1 kit 0   escitalopram (LEXAPRO) 20 MG tablet TAKE 1 TABLET BY MOUTH EVERY DAY 90 tablet 0   famotidine (PEPCID) 40 MG tablet Take 1 tablet (40 mg total) by mouth daily. 90 tablet 3   ferrous sulfate 325 (65 FE) MG EC tablet Take 325 mg by mouth daily.     furosemide (LASIX) 40 MG tablet Take 1 tablet (40 mg total) by mouth daily. 90 tablet 3   glipiZIDE (GLUCOTROL XL) 10 MG 24 hr tablet Take 1 tablet (10 mg total) by mouth daily with breakfast. 90 tablet 3   Lancets (ONETOUCH ULTRASOFT) lancets To check blood sugar once daily 100 each 12   levothyroxine (SYNTHROID) 75 MCG tablet Take 1 tablet (75 mcg total) by mouth daily. 90 tablet 3   metFORMIN (GLUCOPHAGE XR) 500 MG 24  hr tablet Take 2 tablets (1,000 mg total) by mouth daily with breakfast. 180 tablet 1   ONETOUCH VERIO test strip TO CHECK BLOOD SUGAR ONCE DAILY 100 strip 12   Polyethyl Glycol-Propyl Glycol (SYSTANE HYDRATION PF OP) Place 1 drop into both eyes daily as needed (for dry eyes).     Vitamin D, Ergocalciferol, (DRISDOL) 1.25 MG (50000 UNIT) CAPS capsule Take 1 capsule (50,000 Units total) by mouth every 7 (seven) days. 12 capsule 3   No current facility-administered medications for this visit.   PHYSICAL EXAMINATION: ECOG PERFORMANCE STATUS: 1 - Symptomatic but completely ambulatory Vitals:   11/24/20 1042  BP: 128/73  Pulse: 70  Resp: 18  Temp: (!) 97 F (36.1 C)   Filed Weights   11/24/20 1042  Weight:  185 lb 9.6 oz (84.2 kg)    Physical Exam Constitutional:      General: She is not in acute distress.    Appearance: She is well-developed. She is not ill-appearing.  HENT:     Head: Atraumatic.     Nose: Nose normal.     Mouth/Throat:     Pharynx: No oropharyngeal exudate.  Eyes:     General: No scleral icterus.    Conjunctiva/sclera: Conjunctivae normal.  Cardiovascular:     Rate and Rhythm: Normal rate and regular rhythm.  Pulmonary:     Effort: Pulmonary effort is normal.     Breath sounds: Normal breath sounds.  Abdominal:     General: Bowel sounds are normal. There is no distension.     Palpations: Abdomen is soft.     Tenderness: There is no abdominal tenderness.  Musculoskeletal:        General: No tenderness or deformity. Normal range of motion.     Cervical back: Normal range of motion and neck supple.  Skin:    General: Skin is warm and dry.     Coloration: Skin is not pale.  Neurological:     General: No focal deficit present.     Mental Status: She is alert and oriented to person, place, and time.  Psychiatric:        Mood and Affect: Mood normal.        Behavior: Behavior normal.   CMP Latest Ref Rng & Units 11/17/2020 10/28/2020 09/28/2020  Glucose 70  - 99 mg/dL 263(H) 198(H) 130(H)  BUN 8 - 23 mg/dL 21 25 34(H)  Creatinine 0.44 - 1.00 mg/dL 1.54(H) 1.55(H) 1.85(H)  Sodium 135 - 145 mmol/L 136 140 138  Potassium 3.5 - 5.1 mmol/L 4.0 4.0 4.0  Chloride 98 - 111 mmol/L 97(L) 101 97  CO2 22 - 32 mmol/L 30 19(L) 22  Calcium 8.9 - 10.3 mg/dL 9.0 9.6 11.6(H)  Total Protein 6.5 - 8.1 g/dL 6.8 6.7 7.0  Total Bilirubin 0.3 - 1.2 mg/dL 0.7 0.3 0.4  Alkaline Phos 38 - 126 U/L 70 92 100  AST 15 - 41 U/L 19 11 18   ALT 0 - 44 U/L 11 8 17    CBC EXTENDED Latest Ref Rng & Units 11/17/2020 09/28/2020 05/26/2020  WBC 4.0 - 10.5 K/uL 7.2 10.8 9.0  RBC 3.87 - 5.11 MIL/uL 3.20(L) 3.81 3.63(L)  HGB 12.0 - 15.0 g/dL 11.0(L) 12.6 11.9(L)  HCT 36.0 - 46.0 % 33.1(L) 35.8 36.0  PLT 150 - 400 K/uL 226 303 263  NEUTROABS 1.7 - 7.7 K/uL 3.7 5.4 5.1  LYMPHSABS 0.7 - 4.0 K/uL 2.5 4.1(H) 2.7    RADIOGRAPHIC STUDIES: I have personally reviewed the radiological images as listed and agreed with the findings in the report. No results found.  LABORATORY DATA:  I have reviewed the data as listed Lab Results  Component Value Date   WBC 7.2 11/17/2020   HGB 11.0 (L) 11/17/2020   HCT 33.1 (L) 11/17/2020   MCV 103.4 (H) 11/17/2020   PLT 226 11/17/2020   Recent Labs    04/27/20 0959 05/26/20 1013 09/28/20 1406 10/28/20 1135 11/17/20 1256  NA 140 139 138 140 136  K 4.1 4.1 4.0 4.0 4.0  CL 102 102 97 101 97*  CO2 20 25 22  19* 30  GLUCOSE 97 141* 130* 198* 263*  BUN 17 26* 34* 25 21  CREATININE 1.40* 2.06* 1.85* 1.55* 1.54*  CALCIUM 9.5 9.2 11.6* 9.6 9.0  GFRNONAA  36* 24*  --   --  35*  GFRAA 42*  --   --   --   --   PROT 6.4 6.9 7.0 6.7 6.8  ALBUMIN 3.7 3.5 4.3 4.0 3.3*  AST 13 18 18 11 19   ALT 10 11 17 8 11   ALKPHOS 78 71 100 92 70  BILITOT 0.4 0.6 0.4 0.3 0.7    Iron/TIBC/Ferritin/ %Sat    Component Value Date/Time   IRON 56 04/23/2017 1644   TIBC 362 04/23/2017 1644   FERRITIN 19 04/23/2017 1644   IRONPCTSAT 15 04/23/2017 1644      ASSESSMENT & PLAN:  No diagnosis found.  Monoclonal B-cell lymphocytosis of unknown significance- labs reviewed with patient. LDH previously not elevated. Today, wbc and lymphocyte count has normalized. Recommend continued monitoring.  Macrocytosis- hemoglobin now 11. MCV 103.4, MCH 34.4. TSH was normal in June. Unable to add on labs today. 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 Light chains elevated previously. No evidence of end organ damage. Suspect elevated kidney levels are related to DM as opposed to MM. Hypercalcemia has resolved with her stopping supplementation. Anemia as below. Will plan to get bone survey for completeness.  Anemia- mild. Likely related to kidney function but given new macrocytosis will rule out some other causes as well. Add on b12 and folate to next lab visit CKD stage III- creatnine elevated at 1.54 though improved from previous (2.06). Encouraged good blood sugar and blood pressure control. Avoid nephrotoxic medications.   Case discussed with Dr. Grayland Ormond and Dr. Tasia Catchings. They are in agreement with plan of care.    Orders Placed This Encounter  Procedures   DG Bone Survey Met    Standing Status:   Future    Standing Expiration Date:   05/27/2021    Order Specific Question:   Reason for Exam (SYMPTOM  OR DIAGNOSIS REQUIRED)    Answer:   MGUS    Order Specific Question:   Preferred imaging location?    Answer:   [facility]    Order Specific Question:   Call Results- Best Contact Number?    Answer:   [phone]   Vitamin B12    Standing Status:   Future    Standing Expiration Date:   11/24/2021   Folate    Standing Status:   Future    Standing Expiration Date:   11/24/2021   Kappa/lambda light chains    Standing Status:   Future    Standing Expiration Date:   11/24/2021   Protein electrophoresis, serum    Standing Status:   Future    Standing Expiration Date:   11/24/2021    CBC with Differential/Platelet    Standing Status:   Future    Standing Expiration Date:   11/24/2021   Comprehensive metabolic panel    Standing Status:   Future    Standing Expiration Date:   11/24/2021   UPEP/TP, 24-Hr Urine    Standing Status:   Future    Standing Expiration Date:   11/24/2021    Return of visit:  3 months- bone survey & labs then week later see Dr. Tasia Catchings for follow-up  All questions were answered. The patient knows to call the clinic with any problems questions or concerns.  Beckey Rutter, DNP, AGNP-C [place] [facility] [phone] (clinic) 11/24/2020

## 2020-12-19 ENCOUNTER — Other Ambulatory Visit: Payer: Self-pay | Admitting: Physician Assistant

## 2020-12-19 DIAGNOSIS — E7800 Pure hypercholesterolemia, unspecified: Secondary | ICD-10-CM

## 2020-12-19 DIAGNOSIS — M109 Gout, unspecified: Secondary | ICD-10-CM

## 2020-12-21 NOTE — Telephone Encounter (Signed)
Pt called back reporting that the pharmacy told her to contact her PCP for approval. Please advise

## 2021-01-26 ENCOUNTER — Ambulatory Visit
Admission: RE | Admit: 2021-01-26 | Discharge: 2021-01-26 | Disposition: A | Discharge status: Home / Self Care | Payer: Medicare Other | Source: Ambulatory Visit | Attending: Nurse Practitioner | Admitting: Nurse Practitioner

## 2021-01-26 DIAGNOSIS — D472 Monoclonal gammopathy: Secondary | ICD-10-CM | POA: Insufficient documentation

## 2021-02-28 ENCOUNTER — Other Ambulatory Visit: Payer: Medicare Other

## 2021-03-07 ENCOUNTER — Ambulatory Visit: Payer: Medicare Other | Admitting: Oncology

## 2021-03-10 ENCOUNTER — Other Ambulatory Visit: Payer: Self-pay | Admitting: Family Medicine

## 2021-03-17 ENCOUNTER — Other Ambulatory Visit: Payer: Self-pay

## 2021-03-17 ENCOUNTER — Inpatient Hospital Stay: Payer: Medicare Other | Attending: Oncology

## 2021-03-17 DIAGNOSIS — Z809 Family history of malignant neoplasm, unspecified: Secondary | ICD-10-CM | POA: Diagnosis not present

## 2021-03-17 DIAGNOSIS — D649 Anemia, unspecified: Secondary | ICD-10-CM | POA: Diagnosis not present

## 2021-03-17 DIAGNOSIS — D7589 Other specified diseases of blood and blood-forming organs: Secondary | ICD-10-CM

## 2021-03-17 DIAGNOSIS — D472 Monoclonal gammopathy: Secondary | ICD-10-CM | POA: Insufficient documentation

## 2021-03-17 DIAGNOSIS — I10 Essential (primary) hypertension: Secondary | ICD-10-CM | POA: Diagnosis not present

## 2021-03-17 DIAGNOSIS — D72820 Lymphocytosis (symptomatic): Secondary | ICD-10-CM

## 2021-03-17 DIAGNOSIS — D72829 Elevated white blood cell count, unspecified: Secondary | ICD-10-CM | POA: Insufficient documentation

## 2021-03-17 DIAGNOSIS — Z90710 Acquired absence of both cervix and uterus: Secondary | ICD-10-CM | POA: Insufficient documentation

## 2021-03-17 LAB — COMPREHENSIVE METABOLIC PANEL
ALT: 12 U/L (ref 0–44)
AST: 21 U/L (ref 15–41)
Albumin: 3.8 g/dL (ref 3.5–5.0)
Alkaline Phosphatase: 92 U/L (ref 38–126)
Anion gap: 14 (ref 5–15)
BUN: 33 mg/dL — ABNORMAL HIGH (ref 8–23)
CO2: 21 mmol/L — ABNORMAL LOW (ref 22–32)
Calcium: 9.2 mg/dL (ref 8.9–10.3)
Chloride: 101 mmol/L (ref 98–111)
Creatinine, Ser: 2.05 mg/dL — ABNORMAL HIGH (ref 0.44–1.00)
GFR, Estimated: 25 mL/min — ABNORMAL LOW (ref >60)
Glucose, Bld: 171 mg/dL — ABNORMAL HIGH (ref 70–99)
Potassium: 3.3 mmol/L — ABNORMAL LOW (ref 3.5–5.1)
Sodium: 136 mmol/L (ref 135–145)
Total Bilirubin: 0.2 mg/dL — ABNORMAL LOW (ref 0.3–1.2)
Total Protein: 7.2 g/dL (ref 6.5–8.1)

## 2021-03-17 LAB — CBC WITH DIFFERENTIAL/PLATELET
Abs Immature Granulocytes: 0.03 10*3/uL (ref 0.00–0.07)
Basophils Absolute: 0.1 10*3/uL (ref 0.0–0.1)
Basophils Relative: 1 %
Eosinophils Absolute: 0.3 10*3/uL (ref 0.0–0.5)
Eosinophils Relative: 3 %
HCT: 35.9 % — ABNORMAL LOW (ref 36.0–46.0)
Hemoglobin: 11.8 g/dL — ABNORMAL LOW (ref 12.0–15.0)
Immature Granulocytes: 0 %
Lymphocytes Relative: 34 %
Lymphs Abs: 3.3 10*3/uL (ref 0.7–4.0)
MCH: 33.3 pg (ref 26.0–34.0)
MCHC: 32.9 g/dL (ref 30.0–36.0)
MCV: 101.4 fL — ABNORMAL HIGH (ref 80.0–100.0)
Monocytes Absolute: 0.7 10*3/uL (ref 0.1–1.0)
Monocytes Relative: 7 %
Neutro Abs: 5.2 10*3/uL (ref 1.7–7.7)
Neutrophils Relative %: 55 %
Platelets: 268 10*3/uL (ref 150–400)
RBC: 3.54 MIL/uL — ABNORMAL LOW (ref 3.87–5.11)
RDW: 14.5 % (ref 11.5–15.5)
WBC: 9.6 10*3/uL (ref 4.0–10.5)
nRBC: 0 % (ref 0.0–0.2)

## 2021-03-17 LAB — VITAMIN B12: Vitamin B-12: 246 pg/mL (ref 180–914)

## 2021-03-17 LAB — FOLATE: Folate: 10.5 ng/mL (ref >5.9)

## 2021-03-20 LAB — KAPPA/LAMBDA LIGHT CHAINS
Kappa free light chain: 304.5 mg/L — ABNORMAL HIGH (ref 3.3–19.4)
Kappa, lambda light chain ratio: 7.34 — ABNORMAL HIGH (ref 0.26–1.65)
Lambda free light chains: 41.5 mg/L — ABNORMAL HIGH (ref 5.7–26.3)

## 2021-03-22 LAB — PROTEIN ELECTROPHORESIS, SERUM
A/G Ratio: 1.1 (ref 0.7–1.7)
Albumin ELP: 3.4 g/dL (ref 2.9–4.4)
Alpha-1-Globulin: 0.2 g/dL (ref 0.0–0.4)
Alpha-2-Globulin: 1.3 g/dL — ABNORMAL HIGH (ref 0.4–1.0)
Beta Globulin: 1 g/dL (ref 0.7–1.3)
Gamma Globulin: 0.7 g/dL (ref 0.4–1.8)
Globulin, Total: 3.2 g/dL (ref 2.2–3.9)
Total Protein ELP: 6.6 g/dL (ref 6.0–8.5)

## 2021-03-24 ENCOUNTER — Other Ambulatory Visit: Payer: Self-pay

## 2021-03-24 ENCOUNTER — Encounter: Payer: Self-pay | Admitting: Oncology

## 2021-03-24 ENCOUNTER — Inpatient Hospital Stay: Payer: Medicare Other | Admitting: Oncology

## 2021-03-24 VITALS — BP 113/67 | HR 71 | Temp 96.0°F | Ht 63.0 in | Wt 185.7 lb

## 2021-03-24 DIAGNOSIS — N184 Chronic kidney disease, stage 4 (severe): Secondary | ICD-10-CM | POA: Diagnosis not present

## 2021-03-24 DIAGNOSIS — D472 Monoclonal gammopathy: Secondary | ICD-10-CM

## 2021-03-24 DIAGNOSIS — D72820 Lymphocytosis (symptomatic): Secondary | ICD-10-CM | POA: Diagnosis not present

## 2021-03-25 NOTE — Progress Notes (Signed)
Hematology/Oncology Consult note [phone] [phone]   Patient Care Team: [name], [name] as PCP - General (Family Medicine) Lorelee Cover., MD as Consulting Physician (Ophthalmology) Lovell Sheehan, MD as Consulting Physician (Orthopedic Surgery) Murlean Iba, MD (Nephrology) Earlie Server, MD as Consulting Physician (Oncology)  REFERRING PROVIDER: [name], [name]  CHIEF COMPLAINTS/REASON FOR VISIT:  MGUS and monoclonal lymphocytosis.   HISTORY OF PRESENTING ILLNESS:  Donna Malone is a  75 y.o.  female with PMH listed below who was referred to me for evaluation of leukocytosis Reviewed patient' recent labs obtained by PCP.   10/22/2019 CBC showed elevated white count of 11.3, predominantly lymphocytosis.  Normal hemoglobin and platelet count. Previous lab records reviewed. Leukocytosis onset of chronic, duration is since January 2021.   No aggravating or elevated factors. Associated symptoms or signs:  Denies weight loss, fever, chills,night sweats.  Endorses fatigue Smoking history: Never smoker History of recent oral steroid use or steroid injection: No recent steroid injections. History of recent infection: Denies Autoimmune disease history.  Denies  Patient denies any chronic wound, prosthesis.  She has chronic knee arthritis.  #  flow cytometry showed CD5+ , CD23 + B-cell population.  CLL/SLL phenotype.  CD38 negative, <1% leukocytes,<5000 cells/ul INTERVAL HISTORY Donna Malone is a 75 y.o. female who has above history reviewed by me today presents for follow up visit for MGUS and monoclonal lymphocytosis.  Denies weight loss, fever, chills, fatigue, night sweats.  Chronic fatigue at baseline.  Review of Systems  Constitutional:  Positive for fatigue. Negative for appetite change, chills and fever.  HENT:   Negative for hearing loss and voice change.   Eyes:  Negative for eye problems.  Respiratory:  Negative for chest  tightness and cough.   Cardiovascular:  Negative for chest pain.  Gastrointestinal:  Negative for abdominal distention, abdominal pain and blood in stool.  Endocrine: Negative for hot flashes.  Genitourinary:  Negative for difficulty urinating and frequency.   Musculoskeletal:  Positive for arthralgias.  Skin:  Negative for itching and rash.  Neurological:  Negative for extremity weakness.  Hematological:  Negative for adenopathy.  Psychiatric/Behavioral:  Negative for confusion.     MEDICAL HISTORY:  Past Medical History:  Diagnosis Date   Asthma    Bone spur    heel   Depression    Gout    Hypertension     SURGICAL HISTORY: Past Surgical History:  Procedure Laterality Date   ABDOMINAL HYSTERECTOMY  04/1970   partial   CHOLECYSTECTOMY  late 1990's    SOCIAL HISTORY: Social History   Socioeconomic History   Marital status: Widowed    Spouse name: Donna Malone   Number of children: 3   Years of education: Not on file   Highest education level: 12th grade  Occupational History   Occupation: retired  Tobacco Use   Smoking status: Never   Smokeless tobacco: Never  Vaping Use   Vaping Use: Never used  Substance and Sexual Activity   Alcohol use: No   Drug use: No   Sexual activity: Not on file  Other Topics Concern   Not on file  Social History Narrative   Not on file   Social Determinants of Health   Financial Resource Strain: Low Risk    Difficulty of Paying Living Expenses: Not hard at all  Food Insecurity: No Food Insecurity   Worried About Charity fundraiser in the Last Year: Never true   Ran Out  of Food in the Last Year: Never true  Transportation Needs: No Transportation Needs   Lack of Transportation (Medical): No   Lack of Transportation (Non-Medical): No  Physical Activity: Inactive   Days of Exercise per Week: 0 days   Minutes of Exercise per Session: 0 min  Stress: No Stress Concern Present   Feeling of Stress : Not at all  Social Connections:  Moderately Isolated   Frequency of Communication with Friends and Family: More than three times a week   Frequency of Social Gatherings with Friends and Family: More than three times a week   Attends Religious Services: 1 to 4 times per year   Active Member of [employer] or Organizations: No   Attends Archivist Meetings: Never   Marital Status: Widowed  Human resources officer Violence: Not At Risk   Fear of Current or Ex-Partner: No   Emotionally Abused: No   Physically Abused: No   Sexually Abused: No    FAMILY HISTORY: Family History  Problem Relation Age of Onset   CAD Mother    Heart attack Mother    Heart disease Father    Diabetes Cousin    Cancer Cousin     ALLERGIES:  is allergic to sulfa antibiotics.  MEDICATIONS:  Current Outpatient Medications  Medication Sig Dispense Refill   allopurinol (ZYLOPRIM) 100 MG tablet TAKE 1 TABLET BY MOUTH EVERY DAY 90 tablet 1   atorvastatin (LIPITOR) 40 MG tablet TAKE 1 TABLET BY MOUTH EVERY DAY 90 tablet 2   B Complex Vitamins (GNP VITAMIN B COMPLEX PO) Take by mouth daily.      Blood Glucose Monitoring Suppl (ONETOUCH VERIO) w/Device KIT To check blood sugar once daily 1 kit 0   escitalopram (LEXAPRO) 20 MG tablet TAKE 1 TABLET BY MOUTH EVERY DAY 90 tablet 0   famotidine (PEPCID) 40 MG tablet Take 1 tablet (40 mg total) by mouth daily. 90 tablet 3   ferrous sulfate 325 (65 FE) MG EC tablet Take 325 mg by mouth daily.     furosemide (LASIX) 40 MG tablet Take 1 tablet (40 mg total) by mouth daily. 90 tablet 3   glipiZIDE (GLUCOTROL XL) 10 MG 24 hr tablet Take 1 tablet (10 mg total) by mouth daily with breakfast. 90 tablet 3   Lancets (ONETOUCH ULTRASOFT) lancets To check blood sugar once daily 100 each 12   levothyroxine (SYNTHROID) 75 MCG tablet Take 1 tablet (75 mcg total) by mouth daily. 90 tablet 3   Melatonin 10 MG CAPS Take 1 capsule by mouth at bedtime as needed.     metFORMIN (GLUCOPHAGE-XR) 500 MG 24 hr tablet TAKE 2 TABLETS  BY MOUTH EVERY DAY WITH BREAKFAST 180 tablet 0   ONETOUCH VERIO test strip TO CHECK BLOOD SUGAR ONCE DAILY 100 strip 12   Polyethyl Glycol-Propyl Glycol (SYSTANE HYDRATION PF OP) Place 1 drop into both eyes daily as needed (for dry eyes).     Vitamin D, Ergocalciferol, (DRISDOL) 1.25 MG (50000 UNIT) CAPS capsule Take 1 capsule (50,000 Units total) by mouth every 7 (seven) days. 12 capsule 3   Acetaminophen (EQ ARTHRITIS PAIN PO) Take 1 tablet by mouth 2 (two) times daily. As needed     No current facility-administered medications for this visit.     PHYSICAL EXAMINATION: ECOG PERFORMANCE STATUS: 1 - Symptomatic but completely ambulatory Vitals:   03/24/21 1001  BP: 113/67  Pulse: 71  Temp: (!) 96 F (35.6 C)  SpO2: 97%   Filed Weights  03/24/21 1001  Weight: 185 lb 11.2 oz (84.2 kg)    Physical Exam Constitutional:      General: She is not in acute distress.    Comments: Patient walks independently  HENT:     Head: Normocephalic and atraumatic.  Eyes:     General: No scleral icterus. Cardiovascular:     Rate and Rhythm: Normal rate and regular rhythm.     Heart sounds: Normal heart sounds.  Pulmonary:     Effort: Pulmonary effort is normal. No respiratory distress.     Breath sounds: No wheezing.  Abdominal:     General: Bowel sounds are normal. There is no distension.     Palpations: Abdomen is soft.  Musculoskeletal:        General: No deformity. Normal range of motion.     Cervical back: Normal range of motion and neck supple.  Skin:    General: Skin is warm and dry.     Findings: No erythema or rash.  Neurological:     Mental Status: She is alert and oriented to person, place, and time. Mental status is at baseline.     Cranial Nerves: No cranial nerve deficit.     Coordination: Coordination normal.  Psychiatric:        Mood and Affect: Mood normal.    CMP Latest Ref Rng & Units 03/17/2021  Glucose 70 - 99 mg/dL 171(H)  BUN 8 - 23 mg/dL 33(H)   Creatinine 0.44 - 1.00 mg/dL 2.05(H)  Sodium 135 - 145 mmol/L 136  Potassium 3.5 - 5.1 mmol/L 3.3(L)  Chloride 98 - 111 mmol/L 101  CO2 22 - 32 mmol/L 21(L)  Calcium 8.9 - 10.3 mg/dL 9.2  Total Protein 6.5 - 8.1 g/dL 7.2  Total Bilirubin 0.3 - 1.2 mg/dL 0.2(L)  Alkaline Phos 38 - 126 U/L 92  AST 15 - 41 U/L 21  ALT 0 - 44 U/L 12   CBC Latest Ref Rng & Units 03/17/2021  WBC 4.0 - 10.5 K/uL 9.6  Hemoglobin 12.0 - 15.0 g/dL 11.8(L)  Hematocrit 36.0 - 46.0 % 35.9(L)  Platelets 150 - 400 K/uL 268     RADIOGRAPHIC STUDIES: I have personally reviewed the radiological images as listed and agreed with the findings in the report. No results found.  LABORATORY DATA:  I have reviewed the data as listed Lab Results  Component Value Date   WBC 9.6 03/17/2021   HGB 11.8 (L) 03/17/2021   HCT 35.9 (L) 03/17/2021   MCV 101.4 (H) 03/17/2021   PLT 268 03/17/2021   Recent Labs    04/27/20 0959 05/26/20 1013 09/28/20 1406 10/28/20 1135 11/17/20 1256 03/17/21 1145  NA 140 139   < > 140 136 136  K 4.1 4.1   < > 4.0 4.0 3.3*  CL 102 102   < > 101 97* 101  CO2 20 25   < > 19* 30 21*  GLUCOSE 97 141*   < > 198* 263* 171*  BUN 17 26*   < > 25 21 33*  CREATININE 1.40* 2.06*   < > 1.55* 1.54* 2.05*  CALCIUM 9.5 9.2   < > 9.6 9.0 9.2  GFRNONAA 36* 24*  --   --  35* 25*  GFRAA 42*  --   --   --   --   --   PROT 6.4 6.9   < > 6.7 6.8 7.2  ALBUMIN 3.7 3.5   < > 4.0 3.3* 3.8  AST 13  18   < > _0 ALT 10 11   < > _1 ALKPHOS 78 71   < > 92 70 92  BILITOT 0.4 0.6   < > 0.3 0.7 0.2*   < > = values in this interval not displayed.    Iron/TIBC/Ferritin/ %Sat    Component Value Date/Time   IRON 56 04/23/2017 1644   TIBC 362 04/23/2017 1644   FERRITIN 19 04/23/2017 1644   IRONPCTSAT 15 04/23/2017 1644        ASSESSMENT & PLAN:  1. MGUS (monoclonal gammopathy of unknown significance)   2. Monoclonal B-cell lymphocytosis of unknown significance    #Monoclonal B-cell  lymphocytosis of unknown significance Labs are reviewed and discussed with patient. Normal wbc level. Observation.   # IgA MGUS,  Labs are reviewed and discussed with patient. No M protein was observed. Light chain ration has worsened.  Her kidney function has also further worsened.  Avoid NSAIDs.  Check 24 hour UPEP.  Consider bone marrow biopsy in the near future.   #Very mild anemia, stable.     Orders Placed This Encounter  Procedures   IFE+PROTEIN ELECTRO, 24-HR UR    Standing Status:   Future    Standing Expiration Date:   03/24/2022   CBC with Differential/Platelet    Standing Status:   Future    Standing Expiration Date:   03/24/2022   Flow cytometry panel-leukemia/lymphoma work-up    Standing Status:   Future    Standing Expiration Date:   03/24/2022   Kappa/lambda light chains    Standing Status:   Future    Standing Expiration Date:   03/24/2022   Multiple Myeloma Panel (SPEP&IFE w/QIG)    Standing Status:   Future    Standing Expiration Date:   03/24/2022   IFE AND PE, RANDOM URINE    Standing Status:   Future    Standing Expiration Date:   03/24/2022    All questions were answered. The patient knows to call the clinic with any problems questions or concerns.  Return of visit: 3 months  Earlie Server, MD, PhD  03/25/2021

## 2021-03-27 DIAGNOSIS — D472 Monoclonal gammopathy: Secondary | ICD-10-CM | POA: Diagnosis not present

## 2021-03-28 ENCOUNTER — Other Ambulatory Visit: Payer: Self-pay

## 2021-03-28 DIAGNOSIS — D472 Monoclonal gammopathy: Secondary | ICD-10-CM

## 2021-03-30 LAB — IFE+PROTEIN ELECTRO, 24-HR UR
% BETA, Urine: 80.8 %
ALPHA 1 URINE: 1.4 %
Albumin, U: 6 %
Alpha 2, Urine: 3.4 %
GAMMA GLOBULIN URINE: 8.3 %
M-SPIKE %, Urine: 60.7 % — ABNORMAL HIGH
M-Spike, Mg/24 Hr: 168 mg/24 hr — ABNORMAL HIGH
Total Protein, Urine-Ur/day: 277 mg/24 hr — ABNORMAL HIGH (ref 30–150)
Total Protein, Urine: 34.6 mg/dL
Total Volume: 800

## 2021-04-04 ENCOUNTER — Telehealth: Payer: Self-pay

## 2021-04-04 ENCOUNTER — Other Ambulatory Visit: Payer: Self-pay | Admitting: Oncology

## 2021-04-04 DIAGNOSIS — D472 Monoclonal gammopathy: Secondary | ICD-10-CM

## 2021-04-04 NOTE — Telephone Encounter (Signed)
-----   Message from Earlie Server, MD sent at 04/04/2021  8:26 AM EST ----- Her 3 months follow up should be lab around 06/14/21, and md 06/22/2021, Currently appt is in Feb. Please adjust thanks.

## 2021-04-04 NOTE — Telephone Encounter (Signed)
Please adjust appts and update pt of appts.

## 2021-04-12 ENCOUNTER — Other Ambulatory Visit: Payer: Self-pay | Admitting: Physician Assistant

## 2021-04-12 DIAGNOSIS — N1832 Chronic kidney disease, stage 3b: Secondary | ICD-10-CM

## 2021-04-12 DIAGNOSIS — E1122 Type 2 diabetes mellitus with diabetic chronic kidney disease: Secondary | ICD-10-CM

## 2021-04-27 ENCOUNTER — Ambulatory Visit (INDEPENDENT_AMBULATORY_CARE_PROVIDER_SITE_OTHER): Payer: Medicare Other

## 2021-04-27 DIAGNOSIS — Z0000 Encounter for general adult medical examination without abnormal findings: Secondary | ICD-10-CM

## 2021-04-27 NOTE — Progress Notes (Signed)
Virtual Visit via Telephone Note  I connected with  Donna Malone on 04/27/21 at  8:20 AM EST by telephone and verified that I am speaking with the correct person using two identifiers.  Location: Patient: home Provider: [facility] Persons participating in the virtual visit: [place]   I discussed the limitations, risks, security and privacy concerns of performing an evaluation and management service by telephone and the availability of in person appointments. The patient expressed understanding and agreed to proceed.  Interactive audio and video telecommunications were attempted between this nurse and patient, however failed, due to patient having technical difficulties OR patient did not have access to video capability.  We continued and completed visit with audio only.  Some vital signs may be absent or patient reported.   Dionisio David, LPN  Subjective:   Donna Malone is a 76 y.o. female who presents for Medicare Annual (Subsequent) preventive examination.  Review of Systems           Objective:    There were no vitals filed for this visit. There is no height or weight on file to calculate BMI.  Advanced Directives 03/24/2021 04/26/2020 11/04/2019 04/20/2019 04/14/2018 04/04/2017 03/20/2016  Does Patient Have a Medical Advance Directive? Yes Yes No Yes Yes No Yes;No  Type of Paramedic of [place];Living will - [place];Living will [place];Living will - -  Does patient want to make changes to medical advance directive? - - - - - - No - Patient declined  Copy of [place] in Chart? - No - copy requested - No - copy requested No - copy requested - -  Would patient like information on creating a medical advance directive? - - No - Patient declined - No - Patient declined No - Patient declined -    Current Medications (verified) Outpatient  Encounter Medications as of 04/27/2021  Medication Sig   Acetaminophen (EQ ARTHRITIS PAIN PO) Take 1 tablet by mouth 2 (two) times daily. As needed   allopurinol (ZYLOPRIM) 100 MG tablet TAKE 1 TABLET BY MOUTH EVERY DAY   atorvastatin (LIPITOR) 40 MG tablet TAKE 1 TABLET BY MOUTH EVERY DAY   B Complex Vitamins (GNP VITAMIN B COMPLEX PO) Take by mouth daily.    Blood Glucose Monitoring Suppl (ONETOUCH VERIO) w/Device KIT To check blood sugar once daily   escitalopram (LEXAPRO) 20 MG tablet TAKE 1 TABLET BY MOUTH EVERY DAY   famotidine (PEPCID) 40 MG tablet Take 1 tablet (40 mg total) by mouth daily.   ferrous sulfate 325 (65 FE) MG EC tablet Take 325 mg by mouth daily.   furosemide (LASIX) 40 MG tablet Take 1 tablet (40 mg total) by mouth daily.   glipiZIDE (GLUCOTROL XL) 10 MG 24 hr tablet TAKE 1 TABLET (10 MG TOTAL) BY MOUTH DAILY WITH BREAKFAST.   Lancets (ONETOUCH ULTRASOFT) lancets To check blood sugar once daily   levothyroxine (SYNTHROID) 75 MCG tablet Take 1 tablet (75 mcg total) by mouth daily.   Melatonin 10 MG CAPS Take 1 capsule by mouth at bedtime as needed.   metFORMIN (GLUCOPHAGE-XR) 500 MG 24 hr tablet TAKE 2 TABLETS BY MOUTH EVERY DAY WITH BREAKFAST   ONETOUCH VERIO test strip TO CHECK BLOOD SUGAR ONCE DAILY   Polyethyl Glycol-Propyl Glycol (SYSTANE HYDRATION PF OP) Place 1 drop into both eyes daily as needed (for dry eyes).   Vitamin D, Ergocalciferol, (DRISDOL) 1.25 MG (  50000 UNIT) CAPS capsule Take 1 capsule (50,000 Units total) by mouth every 7 (seven) days.   No facility-administered encounter medications on file as of 04/27/2021.    Allergies (verified) Sulfa antibiotics   History: Past Medical History:  Diagnosis Date   Asthma    Bone spur    heel   Depression    Gout    Hypertension    Past Surgical History:  Procedure Laterality Date   ABDOMINAL HYSTERECTOMY  04/1970   partial   CHOLECYSTECTOMY  late 1990's   Family History  Problem Relation Age of  Onset   CAD Mother    Heart attack Mother    Heart disease Father    Diabetes Cousin    Cancer Cousin    Social History   Socioeconomic History   Marital status: Widowed    Spouse name: [name]   Number of children: 3   Years of education: Not on file   Highest education level: 12th grade  Occupational History   Occupation: retired  Tobacco Use   Smoking status: Never   Smokeless tobacco: Never  Vaping Use   Vaping Use: Never used  Substance and Sexual Activity   Alcohol use: No   Drug use: No   Sexual activity: Not on file  Other Topics Concern   Not on file  Social History Narrative   Not on file   Social Determinants of Health   Financial Resource Strain: Low Risk    Difficulty of Paying Living Expenses: Not hard at all  Food Insecurity: No Food Insecurity   Worried About Charity fundraiser in the Last Year: Never true   [place] in the Last Year: Never true  Transportation Needs: No Transportation Needs   Lack of Transportation (Medical): No   Lack of Transportation (Non-Medical): No  Physical Activity: Inactive   Days of Exercise per Week: 0 days   Minutes of Exercise per Session: 0 min  Stress: No Stress Concern Present   Feeling of Stress : Not at all  Social Connections: Moderately Isolated   Frequency of Communication with Friends and Family: More than three times a week   Frequency of Social Gatherings with Friends and Family: More than three times a week   Attends Religious Services: 1 to 4 times per year   Active Member of [employer] or Organizations: No   Attends Archivist Meetings: Never   Marital Status: Widowed    Tobacco Counseling Counseling given: Not Answered   Clinical Intake:  Pre-visit preparation completed: Yes  Pain : No/denies pain     Nutritional Risks: None Diabetes: Yes CBG done?: No Did pt. bring in CBG monitor from home?: No  How often do you need to have someone help you when you read instructions,  pamphlets, or other written materials from your doctor or pharmacy?: 1 - Never  Diabetic?yes Nutrition Risk Assessment:  Has the patient had any N/V/D within the last 2 months?  Yes  Does the patient have any non-healing wounds?  No  Has the patient had any unintentional weight loss or weight gain?  No   Diabetes:  Is the patient diabetic?  Yes  If diabetic, was a CBG obtained today?  No  Did the patient bring in their glucometer from home?  No  How often do you monitor your CBG's? Every morning.   Financial Strains and Diabetes Management:  Are you having any financial strains with the device, your supplies or your  medication? No .  Does the patient want to be seen by Chronic Care Management for management of their diabetes?  No  Would the patient like to be referred to a Nutritionist or for Diabetic Management?  No   Diabetic Exams:  Diabetic Eye Exam: Completed has appointment next week. Overdue for diabetic eye exam. Pt has been advised about the importance in completing this exam.   Diabetic Foot Exam: Completed 09/27/20. Pt has been advised about the importance in completing this exam.   Interpreter Needed?: No  Information entered by :: Kirke Shaggy, LPN   Activities of Daily Living No flowsheet data found.  Patient Care Team: [name], [name] as PCP - General (Family Medicine) Lorelee Cover., MD as Consulting Physician (Ophthalmology) Lovell Sheehan, MD as Consulting Physician (Orthopedic Surgery) Murlean Iba, MD (Nephrology) Earlie Server, MD as Consulting Physician (Oncology)  Indicate any recent Medical Services you may have received from other than [facility] providers in the past year (date may be approximate).     Assessment:   This is a routine wellness examination for [place].  Hearing/Vision screen No results found.  Dietary issues and exercise activities discussed:     Goals Addressed   None    Depression Screen PHQ 2/9 Scores 09/27/2020  04/26/2020 10/22/2019 04/20/2019 04/14/2018 04/04/2017 11/20/2016  PHQ - 2 Score _0 0  PHQ- 9 Score _1 - 8    Fall Risk Fall Risk  04/26/2020 04/20/2019 04/14/2018 02/07/2018 04/09/2017  Falls in the past year? 0 0 1 1 No  Number falls in past yr: 0 0 0 0 -  Injury with Fall? 0 0 0 0 -  Follow up - - Falls prevention discussed - -    FALL RISK PREVENTION PERTAINING TO THE HOME:  Any stairs in or around the home? Yes  If so, are there any without handrails? No  Home free of loose throw rugs in walkways, pet beds, electrical cords, etc? Yes  Adequate lighting in your home to reduce risk of falls? Yes   ASSISTIVE DEVICES UTILIZED TO PREVENT FALLS:  Life alert? No  Use of a cane, walker or w/c? Yes  Grab bars in the bathroom? No  Shower chair or bench in shower? Yes  Elevated toilet seat or a handicapped toilet? Yes    Cognitive Function:Normal cognitive status assessed by direct observation by this Nurse Health Advisor. No abnormalities found.       6CIT Screen 04/14/2018 03/20/2016  What Year? 0 points 0 points  What month? 0 points 0 points  What time? 0 points 0 points  Count back from 20 0 points 0 points  Months in reverse 0 points 0 points  Repeat phrase 0 points 2 points  Total Score 0 2    Immunizations Immunization History  Administered Date(s) Administered   Fluad Quad(high Dose 65+) 04/20/2019   Influenza Split 12/16/2009, 03/21/2011, 01/30/2012   Influenza, High Dose Seasonal PF 02/09/2015, 03/20/2016, 12/24/2016, 02/07/2018   Influenza,inj,Quad PF,6+ Mos 02/06/2013, 12/03/2013   PFIZER(Purple Top)SARS-COV-2 Vaccination 06/19/2019, 07/14/2019, 03/18/2020   Pneumococcal Conjugate-13 12/03/2013   Pneumococcal Polysaccharide-23 03/21/2011   Tdap 04/09/2008    TDAP status: Due, Education has been provided regarding the importance of this vaccine. Advised may receive this vaccine at local pharmacy or Health Dept. Aware to provide a copy of the  vaccination record if obtained from local pharmacy or Health Dept. Verbalized acceptance and understanding.  Flu Vaccine status:  Due, Education has been provided regarding the importance of this vaccine. Advised may receive this vaccine at local pharmacy or Health Dept. Aware to provide a copy of the vaccination record if obtained from local pharmacy or Health Dept. Verbalized acceptance and understanding.  Pneumococcal vaccine status: Up to date  Covid-19 vaccine status: Completed vaccines  Qualifies for Shingles Vaccine? Yes   Zostavax completed No   Shingrix Completed?: No.    Education has been provided regarding the importance of this vaccine. Patient has been advised to call insurance company to determine out of pocket expense if they have not yet received this vaccine. Advised may also receive vaccine at local pharmacy or Health Dept. Verbalized acceptance and understanding.  Screening Tests Health Maintenance  Topic Date Due   Zoster Vaccines- Shingrix (1 of 2) Never done   TETANUS/TDAP  04/09/2018   MAMMOGRAM  04/23/2019   URINE MICROALBUMIN  04/19/2020   COVID-19 Vaccine (4 - Booster for Pfizer series) 05/13/2020   INFLUENZA VACCINE  10/31/2020   OPHTHALMOLOGY EXAM  11/08/2020   HEMOGLOBIN A1C  03/29/2021   FOOT EXAM  09/27/2021   Pneumonia Vaccine 32+ Years old  Completed   DEXA SCAN  Completed   Hepatitis C Screening  Completed   HPV VACCINES  Aged Out   Fecal DNA (Cologuard)  Discontinued    Health Maintenance  Health Maintenance Due  Topic Date Due   Zoster Vaccines- Shingrix (1 of 2) Never done   TETANUS/TDAP  04/09/2018   MAMMOGRAM  04/23/2019   URINE MICROALBUMIN  04/19/2020   COVID-19 Vaccine (4 - Booster for Pfizer series) 05/13/2020   INFLUENZA VACCINE  10/31/2020   OPHTHALMOLOGY EXAM  11/08/2020   HEMOGLOBIN A1C  03/29/2021    Colorectal cancer screening: No longer required. -   Mammogram status: No longer required due to age.  Bone Density  status: Completed 03/10/18. Results reflect: Bone density results: NORMAL. Repeat every 5 years.  Lung Cancer Screening: (Low Dose CT Chest recommended if Age 63-80 years, 30 pack-year currently smoking OR have quit w/in 15years.) does not qualify.   Additional Screening:  Hepatitis C Screening: does qualify; Completed 11/04/19  Vision Screening: Recommended annual ophthalmology exams for early detection of glaucoma and other disorders of the eye. Is the patient up to date with their annual eye exam?  Yes  Who is the provider or what is the name of the office in which the patient attends annual eye exams? Dr.Bell If pt is not established with a provider, would they like to be referred to a provider to establish care? No .   Dental Screening: Recommended annual dental exams for proper oral hygiene  Community Resource Referral / Chronic Care Management: CRR required this visit?  No   CCM required this visit?  No      Plan:     I have personally reviewed and noted the following in the patients chart:   Medical and social history Use of alcohol, tobacco or illicit drugs  Current medications and supplements including opioid prescriptions.  Functional ability and status Nutritional status Physical activity Advanced directives List of other physicians Hospitalizations, surgeries, and ER visits in previous 12 months Vitals Screenings to include cognitive, depression, and falls Referrals and appointments  In addition, I have reviewed and discussed with patient certain preventive protocols, quality metrics, and best practice recommendations. A written personalized care plan for preventive services as well as general preventive health recommendations were provided to patient.     Armanda Forand  Fayrene Helper, LPN   [id number]   Nurse Notes: none

## 2021-04-27 NOTE — Patient Instructions (Signed)
Donna Malone , Thank you for taking time to come for your Medicare Wellness Visit. I appreciate your ongoing commitment to your health goals. Please review the following plan we discussed and let me know if I can assist you in the future.   Screening recommendations/referrals: Colonoscopy: aged out Mammogram: aged out Bone Density: 03/10/18 Recommended yearly ophthalmology/optometry visit for glaucoma screening and checkup Recommended yearly dental visit for hygiene and checkup  Vaccinations: Influenza vaccine: due Pneumococcal vaccine: 12/03/13 Tdap vaccine: 04/09/08, due Shingles vaccine: n/d   Covid-19:06/19/19, 07/14/19, 03/18/20  Advanced directives: no  Conditions/risks identified: none  Next appointment: Follow up in one year for your annual wellness visit 04/30/22 @ 8:20am by phone   Preventive Care 71 Years and Older, Female Preventive care refers to lifestyle choices and visits with your health care provider that can promote health and wellness. What does preventive care include? A yearly physical exam. This is also called an annual well check. Dental exams once or twice a year. Routine eye exams. Ask your health care provider how often you should have your eyes checked. Personal lifestyle choices, including: Daily care of your teeth and gums. Regular physical activity. Eating a healthy diet. Avoiding tobacco and drug use. Limiting alcohol use. Practicing safe sex. Taking low-dose aspirin every day. Taking vitamin and mineral supplements as recommended by your health care provider. What happens during an annual well check? The services and screenings done by your health care provider during your annual well check will depend on your age, overall health, lifestyle risk factors, and family history of disease. Counseling  Your health care provider may ask you questions about your: Alcohol use. Tobacco use. Drug use. Emotional well-being. Home and relationship  well-being. Sexual activity. Eating habits. History of falls. Memory and ability to understand (cognition). Work and work Statistician. Reproductive health. Screening  You may have the following tests or measurements: Height, weight, and BMI. Blood pressure. Lipid and cholesterol levels. These may be checked every 5 years, or more frequently if you are over 51 years old. Skin check. Lung cancer screening. You may have this screening every year starting at age 79 if you have a 30-pack-year history of smoking and currently smoke or have quit within the past 15 years. Fecal occult blood test (FOBT) of the stool. You may have this test every year starting at age 23. Flexible sigmoidoscopy or colonoscopy. You may have a sigmoidoscopy every 5 years or a colonoscopy every 10 years starting at age 1. Hepatitis C blood test. Hepatitis B blood test. Sexually transmitted disease (STD) testing. Diabetes screening. This is done by checking your blood sugar (glucose) after you have not eaten for a while (fasting). You may have this done every 1-3 years. Bone density scan. This is done to screen for osteoporosis. You may have this done starting at age 36. Mammogram. This may be done every 1-2 years. Talk to your health care provider about how often you should have regular mammograms. Talk with your health care provider about your test results, treatment options, and if necessary, the need for more tests. Vaccines  Your health care provider may recommend certain vaccines, such as: Influenza vaccine. This is recommended every year. Tetanus, diphtheria, and acellular pertussis (Tdap, Td) vaccine. You may need a Td booster every 10 years. Zoster vaccine. You may need this after age 48. Pneumococcal 13-valent conjugate (PCV13) vaccine. One dose is recommended after age 2. Pneumococcal polysaccharide (PPSV23) vaccine. One dose is recommended after age 26. Talk to your  health care provider about which  screenings and vaccines you need and how often you need them. This information is not intended to replace advice given to you by your health care provider. Make sure you discuss any questions you have with your health care provider. Document Released: 04/15/2015 Document Revised: 12/07/2015 Document Reviewed: 01/18/2015 Elsevier Interactive Patient Education  2017 [place] Prevention in the Home Falls can cause injuries. They can happen to people of all ages. There are many things you can do to make your home safe and to help prevent falls. What can I do on the outside of my home? Regularly fix the edges of walkways and driveways and fix any cracks. Remove anything that might make you trip as you walk through a door, such as a raised step or threshold. Trim any bushes or trees on the path to your home. Use bright outdoor lighting. Clear any walking paths of anything that might make someone trip, such as rocks or tools. Regularly check to see if handrails are loose or broken. Make sure that both sides of any steps have handrails. Any raised decks and porches should have guardrails on the edges. Have any leaves, snow, or ice cleared regularly. Use sand or salt on walking paths during winter. Clean up any spills in your garage right away. This includes oil or grease spills. What can I do in the bathroom? Use night lights. Install grab bars by the toilet and in the tub and shower. Do not use towel bars as grab bars. Use non-skid mats or decals in the tub or shower. If you need to sit down in the shower, use a plastic, non-slip stool. Keep the floor dry. Clean up any water that spills on the floor as soon as it happens. Remove soap buildup in the tub or shower regularly. Attach bath mats securely with double-sided non-slip rug tape. Do not have throw rugs and other things on the floor that can make you trip. What can I do in the bedroom? Use night lights. Make sure that you have a  light by your bed that is easy to reach. Do not use any sheets or blankets that are too big for your bed. They should not hang down onto the floor. Have a firm chair that has side arms. You can use this for support while you get dressed. Do not have throw rugs and other things on the floor that can make you trip. What can I do in the kitchen? Clean up any spills right away. Avoid walking on wet floors. Keep items that you use a lot in easy-to-reach places. If you need to reach something above you, use a strong step stool that has a grab bar. Keep electrical cords out of the way. Do not use floor polish or wax that makes floors slippery. If you must use wax, use non-skid floor wax. Do not have throw rugs and other things on the floor that can make you trip. What can I do with my stairs? Do not leave any items on the stairs. Make sure that there are handrails on both sides of the stairs and use them. Fix handrails that are broken or loose. Make sure that handrails are as long as the stairways. Check any carpeting to make sure that it is firmly attached to the stairs. Fix any carpet that is loose or worn. Avoid having throw rugs at the top or bottom of the stairs. If you do have throw rugs, attach them  to the floor with carpet tape. Make sure that you have a light switch at the top of the stairs and the bottom of the stairs. If you do not have them, ask someone to add them for you. What else can I do to help prevent falls? Wear shoes that: Do not have high heels. Have rubber bottoms. Are comfortable and fit you well. Are closed at the toe. Do not wear sandals. If you use a stepladder: Make sure that it is fully opened. Do not climb a closed stepladder. Make sure that both sides of the stepladder are locked into place. Ask someone to hold it for you, if possible. Clearly mark and make sure that you can see: Any grab bars or handrails. First and last steps. Where the edge of each step  is. Use tools that help you move around (mobility aids) if they are needed. These include: Canes. Walkers. Scooters. Crutches. Turn on the lights when you go into a dark area. Replace any light bulbs as soon as they burn out. Set up your furniture so you have a clear path. Avoid moving your furniture around. If any of your floors are uneven, fix them. If there are any pets around you, be aware of where they are. Review your medicines with your doctor. Some medicines can make you feel dizzy. This can increase your chance of falling. Ask your doctor what other things that you can do to help prevent falls. This information is not intended to replace advice given to you by your health care provider. Make sure you discuss any questions you have with your health care provider. Document Released: 01/13/2009 Document Revised: 08/25/2015 Document Reviewed: 04/23/2014 Elsevier Interactive Patient Education  2017 [employer].

## 2021-05-04 ENCOUNTER — Encounter: Payer: Self-pay | Admitting: Family Medicine

## 2021-05-04 ENCOUNTER — Other Ambulatory Visit: Payer: Self-pay | Admitting: Physician Assistant

## 2021-05-04 ENCOUNTER — Ambulatory Visit (INDEPENDENT_AMBULATORY_CARE_PROVIDER_SITE_OTHER): Payer: Medicare Other | Admitting: Family Medicine

## 2021-05-04 ENCOUNTER — Telehealth: Payer: Self-pay | Admitting: *Deleted

## 2021-05-04 ENCOUNTER — Other Ambulatory Visit: Payer: Self-pay

## 2021-05-04 VITALS — BP 125/80 | HR 85 | Temp 98.6°F | Resp 16 | Wt 184.4 lb

## 2021-05-04 DIAGNOSIS — N184 Chronic kidney disease, stage 4 (severe): Secondary | ICD-10-CM

## 2021-05-04 DIAGNOSIS — F5104 Psychophysiologic insomnia: Secondary | ICD-10-CM

## 2021-05-04 DIAGNOSIS — F332 Major depressive disorder, recurrent severe without psychotic features: Secondary | ICD-10-CM

## 2021-05-04 DIAGNOSIS — Z794 Long term (current) use of insulin: Secondary | ICD-10-CM

## 2021-05-04 DIAGNOSIS — Z23 Encounter for immunization: Secondary | ICD-10-CM | POA: Diagnosis not present

## 2021-05-04 DIAGNOSIS — K219 Gastro-esophageal reflux disease without esophagitis: Secondary | ICD-10-CM

## 2021-05-04 DIAGNOSIS — Z1331 Encounter for screening for depression: Secondary | ICD-10-CM

## 2021-05-04 DIAGNOSIS — E1122 Type 2 diabetes mellitus with diabetic chronic kidney disease: Secondary | ICD-10-CM | POA: Insufficient documentation

## 2021-05-04 DIAGNOSIS — E1169 Type 2 diabetes mellitus with other specified complication: Secondary | ICD-10-CM

## 2021-05-04 DIAGNOSIS — E785 Hyperlipidemia, unspecified: Secondary | ICD-10-CM

## 2021-05-04 DIAGNOSIS — E0822 Diabetes mellitus due to underlying condition with diabetic chronic kidney disease: Secondary | ICD-10-CM | POA: Diagnosis not present

## 2021-05-04 DIAGNOSIS — E1159 Type 2 diabetes mellitus with other circulatory complications: Secondary | ICD-10-CM

## 2021-05-04 DIAGNOSIS — I152 Hypertension secondary to endocrine disorders: Secondary | ICD-10-CM

## 2021-05-04 DIAGNOSIS — E034 Atrophy of thyroid (acquired): Secondary | ICD-10-CM | POA: Diagnosis not present

## 2021-05-04 HISTORY — DX: Encounter for immunization: Z23

## 2021-05-04 MED ORDER — TRAZODONE HCL 50 MG PO TABS
50.0000 mg | ORAL_TABLET | Freq: Every day | ORAL | 1 refills | Status: DC
Start: 2021-05-04 — End: 2021-08-22

## 2021-05-04 MED ORDER — BUPROPION HCL ER (XL) 150 MG PO TB24: 150.0000 mg | ORAL_TABLET | Freq: Every day | ORAL | 1 refills | Status: DC

## 2021-05-04 NOTE — Assessment & Plan Note (Signed)
LDL goal < 70 

## 2021-05-04 NOTE — Progress Notes (Signed)
Established patient visit   Patient: Donna Malone   DOB: [birth year]   76 y.o. Female  MRN: [id number] Visit Date: 05/04/2021  Today's healthcare provider: [name], [name]   Chief Complaint  Patient presents with   Diabetes   Hypertension   Hypothyroidism   Subjective    HPI  Hypertension, follow-up  BP Readings from Last 3 Encounters:  05/04/21 125/80  03/24/21 113/67  11/24/20 128/73   Wt Readings from Last 3 Encounters:  05/04/21 184 lb 6.4 oz (83.6 kg)  03/24/21 185 lb 11.2 oz (84.2 kg)  11/24/20 185 lb 9.6 oz (84.2 kg)     She was last seen for hypertension 6 months ago.  BP at that visit was 117/81. Management since that visit includes none.  She is following a Regular diet. She is not exercising. She does not smoke.  Use of agents associated with hypertension: none.   Outside blood pressures are not checked. Symptoms: No chest pain No chest pressure  No palpitations No syncope  No dyspnea No orthopnea  No paroxysmal nocturnal dyspnea No lower extremity edema   Pertinent labs: Lab Results  Component Value Date   CHOL 194 04/27/2020   HDL 33 (L) 04/27/2020   LDLCALC 103 (H) 04/27/2020   TRIG 340 (H) 04/27/2020   CHOLHDL 4.2 04/20/2019   Lab Results  Component Value Date   NA 136 03/17/2021   K 3.3 (L) 03/17/2021   CREATININE 2.05 (H) 03/17/2021   GFRNONAA 25 (L) 03/17/2021   GLUCOSE 171 (H) 03/17/2021   TSH 0.990 09/28/2020     The 10-year ASCVD risk score (Arnett DK, et al., 2019) is: 43.5%   ---------------------------------------------------------------------------------------------------  Diabetes Mellitus Type II, Follow-up  Lab Results  Component Value Date   HGBA1C 6.1 (A) 09/27/2020   HGBA1C 6.5 (H) 04/27/2020   HGBA1C 6.6 (A) 10/22/2019   Wt Readings from Last 3 Encounters:  05/04/21 184 lb 6.4 oz (83.6 kg)  03/24/21 185 lb 11.2 oz (84.2 kg)  11/24/20 185 lb 9.6 oz (84.2 kg)   Last seen for diabetes 6 months  ago.  Management since then includes Well controlled Continue current meds, except for changing Metformin to XR to help with diarrhea SE. She reports excellent compliance with treatment. She is not having side effects.  Symptoms: No fatigue No foot ulcerations  Yes appetite changes No nausea  No paresthesia of the feet  No polydipsia  No polyuria No visual disturbances   No vomiting     Home blood sugar records: fasting range: 80-147  Episodes of hypoglycemia? No    Current insulin regiment: none Most Recent Eye Exam: Appt scheduled with Dr. Gloriann Loan Current exercise: no regular exercise Current diet habits: in general, an "unhealthy" diet  Pertinent Labs: Lab Results  Component Value Date   CHOL 194 04/27/2020   HDL 33 (L) 04/27/2020   LDLCALC 103 (H) 04/27/2020   TRIG 340 (H) 04/27/2020   CHOLHDL 4.2 04/20/2019   Lab Results  Component Value Date   NA 136 03/17/2021   K 3.3 (L) 03/17/2021   CREATININE 2.05 (H) 03/17/2021   GFRNONAA 25 (L) 03/17/2021   MICROALBUR 50 04/20/2019   LABMICR 32.8 11/08/2014     ---------------------------------------------------------------------------------------------------  Hypothyroid, follow-up  Lab Results  Component Value Date   TSH 0.990 09/28/2020   TSH 2.320 04/27/2020   TSH 1.620 04/20/2019    Wt Readings from Last 3 Encounters:  05/04/21 184 lb 6.4 oz (83.6  kg)  03/24/21 185 lb 11.2 oz (84.2 kg)  11/24/20 185 lb 9.6 oz (84.2 kg)    She was last seen for hypothyroid 6 months ago.  Management since that visit includes none. She reports excellent compliance with treatment. She is not having side effects.   Symptoms: Yes change in energy level No constipation  Yes diarrhea Yes heat / cold intolerance  No nervousness No palpitations  No weight changes    -----------------------------------------------------------------------------------------   Medications: Outpatient Medications Prior to Visit  Medication Sig    Acetaminophen (EQ ARTHRITIS PAIN PO) Take 1 tablet by mouth 2 (two) times daily. As needed   allopurinol (ZYLOPRIM) 100 MG tablet TAKE 1 TABLET BY MOUTH EVERY DAY   atorvastatin (LIPITOR) 40 MG tablet TAKE 1 TABLET BY MOUTH EVERY DAY   B Complex Vitamins (GNP VITAMIN B COMPLEX PO) Take by mouth daily.    Blood Glucose Monitoring Suppl (ONETOUCH VERIO) w/Device KIT To check blood sugar once daily   escitalopram (LEXAPRO) 20 MG tablet TAKE 1 TABLET BY MOUTH EVERY DAY   famotidine (PEPCID) 40 MG tablet Take 1 tablet (40 mg total) by mouth daily.   ferrous sulfate 325 (65 FE) MG EC tablet Take 325 mg by mouth daily.   furosemide (LASIX) 40 MG tablet Take 1 tablet (40 mg total) by mouth daily.   glipiZIDE (GLUCOTROL XL) 10 MG 24 hr tablet TAKE 1 TABLET (10 MG TOTAL) BY MOUTH DAILY WITH BREAKFAST.   Lancets (ONETOUCH ULTRASOFT) lancets To check blood sugar once daily   levothyroxine (SYNTHROID) 75 MCG tablet Take 1 tablet (75 mcg total) by mouth daily.   Melatonin 10 MG CAPS Take 1 capsule by mouth at bedtime as needed.   metFORMIN (GLUCOPHAGE-XR) 500 MG 24 hr tablet TAKE 2 TABLETS BY MOUTH EVERY DAY WITH BREAKFAST   ONETOUCH VERIO test strip TO CHECK BLOOD SUGAR ONCE DAILY   Polyethyl Glycol-Propyl Glycol (SYSTANE HYDRATION PF OP) Place 1 drop into both eyes daily as needed (for dry eyes).   Vitamin D, Ergocalciferol, (DRISDOL) 1.25 MG (50000 UNIT) CAPS capsule Take 1 capsule (50,000 Units total) by mouth every 7 (seven) days.   No facility-administered medications prior to visit.    Review of Systems     Objective    BP 125/80    Pulse 85    Temp 98.6 F (37 C) (Oral)    Resp 16    Wt 184 lb 6.4 oz (83.6 kg)    SpO2 98%    BMI 32.66 kg/m    Physical Exam Vitals and nursing note reviewed.  Constitutional:      General: She is not in acute distress.    Appearance: Normal appearance. She is obese. She is not ill-appearing, toxic-appearing or diaphoretic.  HENT:     Head: Normocephalic  and atraumatic.  Cardiovascular:     Rate and Rhythm: Normal rate and regular rhythm.     Pulses: Normal pulses.     Heart sounds: Normal heart sounds. No murmur heard.   No friction rub. No gallop.  Pulmonary:     Effort: Pulmonary effort is normal. No respiratory distress.     Breath sounds: Normal breath sounds. No stridor. No wheezing, rhonchi or rales.  Chest:     Chest wall: No tenderness.  Abdominal:     General: Bowel sounds are normal.     Palpations: Abdomen is soft.  Musculoskeletal:        General: No swelling, tenderness, deformity or signs of  injury. Normal range of motion.     Right lower leg: No edema.     Left lower leg: No edema.  Skin:    General: Skin is warm and dry.     Capillary Refill: Capillary refill takes less than 2 seconds.     Coloration: Skin is not jaundiced or pale.     Findings: No bruising, erythema, lesion or rash.  Neurological:     General: No focal deficit present.     Mental Status: She is alert and oriented to person, place, and time. Mental status is at baseline.     Cranial Nerves: No cranial nerve deficit.     Sensory: No sensory deficit.     Motor: No weakness.     Coordination: Coordination normal.  Psychiatric:        Mood and Affect: Mood is depressed.        Behavior: Behavior normal.        Thought Content: Thought content normal.        Judgment: Judgment normal.     No results found for any visits on 05/04/21.  Assessment & Plan     Problem List Items Addressed This Visit       Cardiovascular and Mediastinum   Hypertension associated with diabetes ([place]) - Primary    Chronic, stable Denies CP Denies SOB Denies DOE No LE Edema noted on exam Continue medication Refills stable Seek emergent care if you develop CP, chest pain or chest pressure       Relevant Orders   Comprehensive metabolic panel   AMB Referral to [facility]     Endocrine   Hypothyroidism    Reports anxiety related to  depression from grief of passing of spouse Reports 30 year thinning hair hx- no acute hair loss      Relevant Orders   TSH + free T4   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 ratio   Hemoglobin A1c   AMB Referral to [facility]   Hyperlipidemia associated with type 2 diabetes mellitus (HCC)    LDL goal <70      Relevant Orders   Lipid panel   AMB Referral to [facility]     Other   Positive screening for depression on 2-item Patient Health Questionnaire (PHQ-2)    Agreeable to start new medication to assist Denies SI or HI Contracted to safety       Relevant Medications   buPROPion (WELLBUTRIN XL) 150 MG 24 hr tablet   Severe episode of recurrent major depressive disorder, without psychotic features ([place])    Per PHQ score Recommend counseling options- pt refused Pt agreeable to CCM referral      Relevant Medications   buPROPion (WELLBUTRIN XL) 150 MG 24 hr tablet   traZODone (DESYREL) 50 MG tablet   Psychophysiological insomnia    Likely linked to depression and living alone Reports no sleep the night prior to this appt      Relevant Medications   traZODone (DESYREL) 50 MG tablet   Need for influenza vaccination   Relevant Orders   Flu Vaccine QUAD High Dose(Fluad) (Completed)   Need for shingles vaccine   Relevant Orders   Varicella-zoster vaccine IM (Completed)   Need for tetanus booster   Relevant Orders  Tdap vaccine greater than or equal to 7yo IM (Completed)     Return in about 6 weeks (around 06/15/2021) for anxiety and depression.      Vonna Kotyk, [name], have reviewed all documentation for this visit. The documentation on 05/04/21 for the exam, diagnosis, procedures, and orders are all accurate and complete.  Patient seen and examined by  Tally Joe,  [name] note scribed by [employer], [place], [place] [phone] (phone) [phone] (fax)  [place]

## 2021-05-04 NOTE — Patient Instructions (Signed)
Will Add Wellbutrin 150 mg daily Discussed potential side effects, incl GI upset, sexual dysfunction, increased anxiety, and SI Discussed that it can take 6-8 weeks to reach full efficacy Contracted for safety - no SI/HI Discussed synergistic effects of medications and therapy

## 2021-05-04 NOTE — Assessment & Plan Note (Signed)
Agreeable to start new medication to assist Denies SI or HI Contracted to safety

## 2021-05-04 NOTE — Assessment & Plan Note (Signed)
Reports anxiety related to depression from grief of passing of spouse Reports 30 year thinning hair hx- no acute hair loss

## 2021-05-04 NOTE — Assessment & Plan Note (Signed)
Likely linked to depression and living alone Reports no sleep the night prior to this appt

## 2021-05-04 NOTE — Chronic Care Management (AMB) (Signed)
Chronic Care Management   Note  05/04/2021 Name: Donna Malone MRN: [id number] DOB: 10-May-1945  Donna Malone is a 76 y.o. year old female who is a primary care patient of [name], [name]. I reached out to [employer] by phone today in response to a referral sent by [name]'s PCP.  Donna Malone was given information about Chronic Care Management services today including:  CCM service includes personalized support from designated clinical staff supervised by her physician, including individualized plan of care and coordination with other care providers 24/7 contact phone numbers for assistance for urgent and routine care needs. Service will only be billed when office clinical staff spend 20 minutes or more in a month to coordinate care. Only one practitioner may furnish and bill the service in a calendar month. The patient may stop CCM services at any time (effective at the end of the month) by phone call to the office staff. The patient is responsible for co-pay (up to 20% after annual deductible is met) if co-pay is required by the individual health plan.   Patient agreed to services and verbal consent obtained.   Follow up plan: Telephone appointment with care management team member scheduled for: 05/22/2021  Julian Hy, [facility]  Direct Dial: [phone]

## 2021-05-04 NOTE — Assessment & Plan Note (Signed)
>>  ASSESSMENT AND PLAN FOR SEVERE EPISODE OF RECURRENT MAJOR DEPRESSIVE DISORDER, WITHOUT PSYCHOTIC FEATURES (HCC) WRITTEN ON 05/04/2021  7:14 PM BY PAYNE, ELISE T, [name]  Per PHQ score Recommend counseling options- pt refused Pt agreeable to CCM referral

## 2021-05-04 NOTE — Assessment & Plan Note (Signed)
Per PHQ score Recommend counseling options- pt refused Pt agreeable to CCM referral

## 2021-05-04 NOTE — Assessment & Plan Note (Signed)
CKD has advanced within last year

## 2021-05-04 NOTE — Assessment & Plan Note (Signed)
Chronic, stable Denies CP Denies SOB Denies DOE No LE Edema noted on exam Continue medication Refills stable Seek emergent care if you develop CP, chest pain or chest pressure

## 2021-05-05 ENCOUNTER — Ambulatory Visit: Payer: Self-pay | Admitting: *Deleted

## 2021-05-05 ENCOUNTER — Other Ambulatory Visit: Payer: Self-pay | Admitting: Family Medicine

## 2021-05-05 DIAGNOSIS — E1122 Type 2 diabetes mellitus with diabetic chronic kidney disease: Secondary | ICD-10-CM | POA: Insufficient documentation

## 2021-05-05 LAB — COMPREHENSIVE METABOLIC PANEL
ALT: 11 IU/L (ref 0–32)
AST: 16 IU/L (ref 0–40)
Albumin/Globulin Ratio: 1.2 (ref 1.2–2.2)
Albumin: 4.1 g/dL (ref 3.7–4.7)
Alkaline Phosphatase: 126 IU/L — ABNORMAL HIGH (ref 44–121)
BUN/Creatinine Ratio: 15 (ref 12–28)
BUN: 28 mg/dL — ABNORMAL HIGH (ref 8–27)
Bilirubin Total: 0.4 mg/dL (ref 0.0–1.2)
CO2: 20 mmol/L (ref 20–29)
Calcium: 9.9 mg/dL (ref 8.7–10.3)
Chloride: 99 mmol/L (ref 96–106)
Creatinine, Ser: 1.82 mg/dL — ABNORMAL HIGH (ref 0.57–1.00)
Globulin, Total: 3.3 g/dL (ref 1.5–4.5)
Glucose: 144 mg/dL — ABNORMAL HIGH (ref 70–99)
Potassium: 3.6 mmol/L (ref 3.5–5.2)
Sodium: 136 mmol/L (ref 134–144)
Total Protein: 7.4 g/dL (ref 6.0–8.5)
eGFR: 28 mL/min/{1.73_m2} — ABNORMAL LOW (ref >59)

## 2021-05-05 LAB — MICROALBUMIN / CREATININE URINE RATIO
Creatinine, Urine: 135.6 mg/dL
Microalb/Creat Ratio: 19 mg/g creat (ref 0–29)
Microalbumin, Urine: 26.3 ug/mL

## 2021-05-05 LAB — LIPID PANEL
Chol/HDL Ratio: 6.2 ratio — ABNORMAL HIGH (ref 0.0–4.4)
Cholesterol, Total: 204 mg/dL — ABNORMAL HIGH (ref 100–199)
HDL: 33 mg/dL — ABNORMAL LOW (ref >39)
LDL Chol Calc (NIH): 91 mg/dL (ref 0–99)
Triglycerides: 487 mg/dL — ABNORMAL HIGH (ref 0–149)
VLDL Cholesterol Cal: 80 mg/dL — ABNORMAL HIGH (ref 5–40)

## 2021-05-05 LAB — TSH+FREE T4
Free T4: 1.3 ng/dL (ref 0.82–1.77)
TSH: 1.25 u[IU]/mL (ref 0.450–4.500)

## 2021-05-05 LAB — HEMOGLOBIN A1C
Est. average glucose Bld gHb Est-mCnc: 146 mg/dL
Hgb A1c MFr Bld: 6.7 % — ABNORMAL HIGH (ref 4.8–5.6)

## 2021-05-05 MED ORDER — PREDNISONE 10 MG (21) PO TBPK: ORAL_TABLET | ORAL | 0 refills | Status: DC

## 2021-05-05 NOTE — Telephone Encounter (Signed)
°  Chief Complaint: reaction to vaccination given yesterday . Received shingles, TDAP and flu vaccine 05/04/21 Symptoms: soreness redness right arm . Rash to chest and neck , itching  Frequency: started last night  Pertinent Negatives: Patient denies chest pain difficulty breathing or fever Disposition: [] ED /[] Urgent Care (no appt availability in office) / [] Appointment(In office/virtual)/ []  [facility]/ [] Home Care/ [] Refused Recommended Disposition /[] [facility]/ [x]  Follow-up with PCP Additional Notes:   Please advise. No appt available. Patient told to call back . Patient waiting for a call back .       Reason for Disposition  [1] Redness or red streak around the injection site AND [2] begins > 48 hours after shot AND [3] no fever  (Exception: red area < 1 inch or 2.5 cm wide)  Answer Assessment - Initial Assessment Questions 1. SYMPTOMS: "What is the main symptom?" (e.g., redness, swelling, pain)      Redness, rash to chest and neck,itching , warm to touch redness and soreness to right arm  2. ONSET: "When was the vaccine (shot) given?" "How much later did the rash  begin?" (e.g., hours, days ago)      05/04/21, last night  3. SEVERITY: "How bad is it?"      Itching and soreness of arm  4. FEVER: "Is there a fever?" If Yes, ask: "What is it, how was it measured, and when did it start?"      Na  5. IMMUNIZATIONS GIVEN: "What shots have you recently received?"     Shingles, TDAP , flu vaccine 6. PAST REACTIONS: "Have you reacted to immunizations before?" If Yes, ask: "What happened?"     No  7. OTHER SYMPTOMS: "Do you have any other symptoms?"     No  Protocols used: Immunization Reactions-A-AH

## 2021-05-05 NOTE — Telephone Encounter (Signed)
Patient has been advised. KW 

## 2021-05-09 ENCOUNTER — Ambulatory Visit: Payer: Self-pay | Admitting: *Deleted

## 2021-05-09 ENCOUNTER — Telehealth: Payer: Self-pay

## 2021-05-09 NOTE — Progress Notes (Signed)
Chronic Care Management Pharmacy Assistant   Name: Donna Malone  MRN: [id number] DOB: [birth year]  Chart review for the clinical pharmacist on 05/22/2021 at 1:30 pm.  Conditions to be addressed/monitored: HTN, HLD, DMII, CKD Stage 3, Depression, Gout, Hypothyroidism, and Esophageal reflux, Degenerative arthritis of knee, Psychophysiological Insomnia, Vitamin D deficiency, Low back pain,   Primary concerns for visit include:  -Stopping metformin due to upset stomach and diarrhea, and try another medication.  - Patient states she would like to know if she could get a libre device because she really does not want to "stick her finger any more".   Recent office visits:  05/05/2021 Tally Joe [name] (PCP) start prednisone Taper pack  05/04/2021 Tally Joe [name] (PCP) Start Wellbutrin 150 mg daily, start trazodone 50 mg daily,AMB Referral to [facility], Lab work completed A1C -6.7%, Return in about 6 weeks   04/27/2021 Kirke Shaggy LPN (PCP Office) Medicare Wellness completed, no medication changes noted  Recent consult visits:  03/24/2021 Dr. Tasia Catchings MD (Oncology) No Medication Changes noted  11/24/2020 Beckey Rutter NP (Oncology) No Medication Changes noted, Follow up in 3 months  Hospital visits:  None in previous 6 months   Have you seen any other providers since your last visit?   Patient denies seeing any other providers.  Any changes in your medications or health?   Patient denies any changes in her medications or health.  Any side effects from any medications?   Patient states she would like to stop metformin because it is causing her to have a upset stomach, and diarrhea.   Do you have an symptoms or problems not managed by your medications?   Patient denies any problems.  Any concerns about your health right now?   Patient states she has some concerns about metformin.  Has your provider asked that you check blood pressure, blood sugar, or follow  special diet at home?   Patient reports checking her blood pressure and blood sugar at home.  Do you get any type of exercise on a regular basis?   Patient states she does not exercise.  Can you think of a goal you would like to reach for your health?   Patient will inform the clinical pharmacist.  Do you have any problems getting your medications?   Patient denies any problems/issue getting her medications  Is there anything that you would like to discuss during the appointment?   - Stopping metformin due to upset stomach and diarrhea, and try another medication.  - Patient states she would like to know if she could get a libre device because she really does not want to "stick her finger any more".  Please bring medications and supplements to appointment  Patient is aware to have all medication and supplements at her appointment.  Medications: Outpatient Encounter Medications as of 05/09/2021  Medication Sig   Acetaminophen (EQ ARTHRITIS PAIN PO) Take 1 tablet by mouth 2 (two) times daily. As needed   allopurinol (ZYLOPRIM) 100 MG tablet TAKE 1 TABLET BY MOUTH EVERY DAY   atorvastatin (LIPITOR) 40 MG tablet TAKE 1 TABLET BY MOUTH EVERY DAY   B Complex Vitamins (GNP VITAMIN B COMPLEX PO) Take by mouth daily.    Blood Glucose Monitoring Suppl (ONETOUCH VERIO) w/Device KIT To check blood sugar once daily   buPROPion (WELLBUTRIN XL) 150 MG 24 hr tablet Take 1 tablet (150 mg total) by mouth daily.   escitalopram (LEXAPRO) 20 MG tablet TAKE 1 TABLET BY  MOUTH EVERY DAY   famotidine (PEPCID) 40 MG tablet Take 1 tablet (40 mg total) by mouth daily.   ferrous sulfate 325 (65 FE) MG EC tablet Take 325 mg by mouth daily.   furosemide (LASIX) 40 MG tablet Take 1 tablet (40 mg total) by mouth daily.   glipiZIDE (GLUCOTROL XL) 10 MG 24 hr tablet TAKE 1 TABLET (10 MG TOTAL) BY MOUTH DAILY WITH BREAKFAST.   Lancets (ONETOUCH ULTRASOFT) lancets To check blood sugar once daily   levothyroxine  (SYNTHROID) 75 MCG tablet Take 1 tablet (75 mcg total) by mouth daily.   Melatonin 10 MG CAPS Take 1 capsule by mouth at bedtime as needed.   metFORMIN (GLUCOPHAGE-XR) 500 MG 24 hr tablet TAKE 2 TABLETS BY MOUTH EVERY DAY WITH BREAKFAST   ONETOUCH VERIO test strip TO CHECK BLOOD SUGAR ONCE DAILY   Polyethyl Glycol-Propyl Glycol (SYSTANE HYDRATION PF OP) Place 1 drop into both eyes daily as needed (for dry eyes).   predniSONE (STERAPRED UNI-PAK 21 TAB) 10 MG (21) TBPK tablet Take as directed on package Recommend AM dosing, with food   traZODone (DESYREL) 50 MG tablet Take 1 tablet (50 mg total) by mouth at bedtime.   Vitamin D, Ergocalciferol, (DRISDOL) 1.25 MG (50000 UNIT) CAPS capsule Take 1 capsule (50,000 Units total) by mouth every 7 (seven) days.   No facility-administered encounter medications on file as of 05/09/2021.    Care Gaps: Covid-19 Vaccine Ophthalmology Exam  Star Rating Drugs: Glipizide 10 mg last filled 04/12/2021 [facility]. Metformin 500 mg last filled 03/10/2021 [facility] Atorvastatin 40 mg last filled 03/19/2021 [facility] day supply at [facility].  Medication Fill Gaps: None ID  Anderson [place] Clinical Pharmacist Assistant [id number]

## 2021-05-09 NOTE — Telephone Encounter (Signed)
Summary: medication question   Pt called saying she had an appt with provider on 05/04/21 and they talked about changing her metformin to a different drug because it was causing her to have diarrhea but she says nothing has been sent to th pharmacy.   CB#  [phone] or [phone]      Reason for Disposition  Prescription request for new medicine (not a refill)  Answer Assessment - Initial Assessment Questions 1. NAME of MEDICATION: "What medicine are you calling about?"     Metformin 500mg   24 hour tablet 2. QUESTION: "What is your question?" (e.g., double dose of medicine, side effect)     Patient states she has SE- diarrhea and provider states she was going to see if there was an alternative( preferably tier 1-2) for her. She has not heard anything 3. PRESCRIBING HCP: "Who prescribed it?" Reason: if prescribed by specialist, call should be referred to that group.     PCP 4. SYMPTOMS: "Do you have any symptoms?"     Diarrhea with metformin  Protocols used: Medication Question Call-A-AH

## 2021-05-10 NOTE — Telephone Encounter (Signed)
Advised 

## 2021-05-15 ENCOUNTER — Other Ambulatory Visit: Payer: Self-pay | Admitting: Physician Assistant

## 2021-05-15 ENCOUNTER — Other Ambulatory Visit: Payer: Self-pay | Admitting: Family Medicine

## 2021-05-15 DIAGNOSIS — F3342 Major depressive disorder, recurrent, in full remission: Secondary | ICD-10-CM

## 2021-05-15 DIAGNOSIS — M109 Gout, unspecified: Secondary | ICD-10-CM

## 2021-05-15 DIAGNOSIS — R609 Edema, unspecified: Secondary | ICD-10-CM

## 2021-05-17 ENCOUNTER — Other Ambulatory Visit: Payer: Medicare Other

## 2021-05-17 ENCOUNTER — Other Ambulatory Visit: Payer: Self-pay | Admitting: Family Medicine

## 2021-05-17 DIAGNOSIS — R609 Edema, unspecified: Secondary | ICD-10-CM

## 2021-05-17 NOTE — Telephone Encounter (Signed)
Copied from [place] [id number]. Topic: Quick Communication - Rx Refill/Question >> May 17, 2021 12:06 PM Tessa Lerner A wrote: Medication: furosemide (LASIX) 40 MG tablet [[id number]]   Has the patient contacted their pharmacy? Yes.  The patient has been directed to contact their PCP (Agent: If no, request that the patient contact the pharmacy for the refill. If patient does not wish to contact the pharmacy document the reason why and proceed with request.) (Agent: If yes, when and what did the pharmacy advise?)  Preferred Pharmacy (with phone number or street name): [id number] [place] [place] [facility] [address] [place] [address] Phone: [phone] Fax: [phone] Hours: Not open 24 hours  Has the patient been seen for an appointment in the last year OR does the patient have an upcoming appointment? Yes.    Agent: Please be advised that RX refills may take up to 3 business days. We ask that you follow-up with your pharmacy.

## 2021-05-17 NOTE — Telephone Encounter (Signed)
Requested medications are due for refill today.  yes  Requested medications are on the active medications list.  yes  Last refill. 04/25/2020 #90 3 refills  Future visit scheduled.   yes  Notes to clinic.  Failed protocol d/t missing lab.    Requested Prescriptions  Pending Prescriptions Disp Refills   furosemide (LASIX) 40 MG tablet 90 tablet 3    Sig: Take 1 tablet (40 mg total) by mouth daily.     Cardiovascular:  Diuretics - Loop Failed - 05/17/2021 12:24 PM      Failed - Cr in normal range and within 180 days    Creatinine, Ser  Date Value Ref Range Status  05/04/2021 1.82 (H) 0.57 - 1.00 mg/dL Final          Failed - Mg Level in normal range and within 180 days    Magnesium  Date Value Ref Range Status  02/07/2018 1.2 (L) 1.6 - 2.3 mg/dL Final          Passed - K in normal range and within 180 days    Potassium  Date Value Ref Range Status  05/04/2021 3.6 3.5 - 5.2 mmol/L Final          Passed - Ca in normal range and within 180 days    Calcium  Date Value Ref Range Status  05/04/2021 9.9 8.7 - 10.3 mg/dL Final          Passed - Na in normal range and within 180 days    Sodium  Date Value Ref Range Status  05/04/2021 136 134 - 144 mmol/L Final          Passed - Cl in normal range and within 180 days    Chloride  Date Value Ref Range Status  05/04/2021 99 96 - 106 mmol/L Final          Passed - Last BP in normal range    BP Readings from Last 1 Encounters:  05/04/21 125/80          Passed - Valid encounter within last 6 months    Recent Outpatient Visits           1 week ago Hypertension associated with diabetes [facility])   [facility] Tally Joe T, [name]   7 months ago Type 2 diabetes mellitus with stage 3b chronic kidney disease, without long-term current use of insulin [facility])   [facility] [place], Dionne Bucy, MD   1 year ago Encounter for Medicare annual wellness exam   [facility] [place],  [place], LPN   1 year ago Acute non-recurrent pansinusitis   [facility], Clearnce Sorrel, [place]   1 year ago Essential hypertension   [facility], Clearnce Sorrel, [place]       Future Appointments             In 1 month Rollene Rotunda, Jaci Standard, [place], [place]

## 2021-05-22 ENCOUNTER — Ambulatory Visit (INDEPENDENT_AMBULATORY_CARE_PROVIDER_SITE_OTHER): Payer: Medicare Other

## 2021-05-22 DIAGNOSIS — Z794 Long term (current) use of insulin: Secondary | ICD-10-CM

## 2021-05-22 DIAGNOSIS — F32A Depression, unspecified: Secondary | ICD-10-CM

## 2021-05-22 DIAGNOSIS — M1A9XX0 Chronic gout, unspecified, without tophus (tophi): Secondary | ICD-10-CM

## 2021-05-22 DIAGNOSIS — F5101 Primary insomnia: Secondary | ICD-10-CM

## 2021-05-22 DIAGNOSIS — E034 Atrophy of thyroid (acquired): Secondary | ICD-10-CM

## 2021-05-22 DIAGNOSIS — K219 Gastro-esophageal reflux disease without esophagitis: Secondary | ICD-10-CM

## 2021-05-22 DIAGNOSIS — N184 Chronic kidney disease, stage 4 (severe): Secondary | ICD-10-CM

## 2021-05-22 DIAGNOSIS — E1159 Type 2 diabetes mellitus with other circulatory complications: Secondary | ICD-10-CM

## 2021-05-22 DIAGNOSIS — E7800 Pure hypercholesterolemia, unspecified: Secondary | ICD-10-CM

## 2021-05-22 NOTE — Progress Notes (Signed)
Chronic Care Management Pharmacy Note  06/01/2021 Name:  Donna Malone MRN:  [id number] DOB:  [birth year]  Summary: Patient presents for initial CCM consult. She reports she struggles with low energy, going on for >1 year since she lost her husband.  -Query safe use of metformin in CKD 4. Patient has upcoming nephrology consult.   Recommendations/Changes made from today's visit: -Start Krill Oil 1000 mg daily for hypertriglyceridemia.  Plan: CPP follow-up 1 month   Subjective: Donna Malone is an 76 y.o. year old female who is a primary patient of [name], [name].  The CCM team was consulted for assistance with disease management and care coordination needs.    Engaged with patient by telephone for initial visit in response to provider referral for pharmacy case management and/or care coordination services.   Consent to Services:  The patient was given the following information about Chronic Care Management services today, agreed to services, and gave verbal consent: 1. CCM service includes personalized support from designated clinical staff supervised by the primary care provider, including individualized plan of care and coordination with other care providers 2. 24/7 contact phone numbers for assistance for urgent and routine care needs. 3. Service will only be billed when office clinical staff spend 20 minutes or more in a month to coordinate care. 4. Only one practitioner may furnish and bill the service in a calendar month. 5.The patient may stop CCM services at any time (effective at the end of the month) by phone call to the office staff. 6. The patient will be responsible for cost sharing (co-pay) of up to 20% of the service fee (after annual deductible is met). Patient agreed to services and consent obtained.  Patient Care Team: [name], [name] as PCP - General (Family Medicine) Lorelee Cover., MD as Consulting Physician (Ophthalmology) Lovell Sheehan, MD as  Consulting Physician (Orthopedic Surgery) Murlean Iba, MD (Nephrology) Earlie Server, MD as Consulting Physician (Oncology) Germaine Pomfret, [place] (Pharmacist)  Recent office visits: 05/05/2021 Tally Joe [name] (PCP) start prednisone Taper pack   05/04/2021 Tally Joe [name] (PCP) Start Wellbutrin 150 mg daily, start trazodone 50 mg daily,AMB Referral to [place], Lab work completed A1C -6.7%, Return in about 6 weeks    04/27/2021 Kirke Shaggy LPN (PCP Office) Medicare Wellness completed, no medication changes noted  Recent consult visits: 03/24/2021 Dr. Tasia Catchings MD (Oncology) No Medication Changes noted   11/24/2020 Beckey Rutter NP (Oncology) No Medication Changes noted, Follow up in 3 months  Hospital visits: None in previous 6 months   Objective:  Lab Results  Component Value Date   CREATININE 1.82 (H) 05/04/2021   BUN 28 (H) 05/04/2021   EGFR 28 (L) 05/04/2021   GFRNONAA 25 (L) 03/17/2021   GFRAA 42 (L) 04/27/2020   NA 136 05/04/2021   K 3.6 05/04/2021   CALCIUM 9.9 05/04/2021   CO2 20 05/04/2021   GLUCOSE 144 (H) 05/04/2021    Lab Results  Component Value Date/Time   HGBA1C 6.7 (H) 05/04/2021 02:08 PM   HGBA1C 6.1 (A) 09/27/2020 11:37 AM   HGBA1C 6.5 (H) 04/27/2020 09:59 AM   MICROALBUR 50 04/20/2019 10:34 AM   MICROALBUR 20 04/09/2017 04:48 PM    Last diabetic Eye exam: No results found for: HMDIABEYEEXA  Last diabetic Foot exam: No results found for: HMDIABFOOTEX   Lab Results  Component Value Date   CHOL 204 (H) 05/04/2021   HDL 33 (L) 05/04/2021   LDLCALC 91  05/04/2021   TRIG 487 (H) 05/04/2021   CHOLHDL 6.2 (H) 05/04/2021    Hepatic Function Latest Ref Rng & Units 05/04/2021 03/17/2021 11/17/2020  Total Protein 6.0 - 8.5 g/dL 7.4 7.2 6.8  Albumin 3.7 - 4.7 g/dL 4.1 3.8 3.3(L)  AST 0 - 40 IU/L 16 21 19   ALT 0 - 32 IU/L 11 12 11   Alk Phosphatase 44 - 121 IU/L 126(H) 92 70  Total Bilirubin 0.0 - 1.2 mg/dL 0.4 0.2(L) 0.7    Lab Results   Component Value Date/Time   TSH 1.250 05/04/2021 02:08 PM   TSH 0.990 09/28/2020 02:06 PM   FREET4 1.30 05/04/2021 02:08 PM    CBC Latest Ref Rng & Units 03/17/2021 11/17/2020 09/28/2020  WBC 4.0 - 10.5 K/uL 9.6 7.2 10.8  Hemoglobin 12.0 - 15.0 g/dL 11.8(L) 11.0(L) 12.6  Hematocrit 36.0 - 46.0 % 35.9(L) 33.1(L) 35.8  Platelets 150 - 400 K/uL 268 226 303    Lab Results  Component Value Date/Time   VD25OH 81.9 09/28/2020 02:06 PM   VD25OH 74.2 04/27/2020 09:59 AM    Clinical ASCVD: No  The 10-year ASCVD risk score (Arnett DK, et al., 2019) is: 43.5%   Values used to calculate the score:     Age: 76 years     Sex: Female     Is Non-Hispanic African American: No     Diabetic: Yes     Tobacco smoker: No     Systolic Blood Pressure: 136 mmHg     Is BP treated: Yes     HDL Cholesterol: 33 mg/dL     Total Cholesterol: 204 mg/dL    Depression screen [facility] 2/9 05/04/2021 04/27/2021 09/27/2020  Decreased Interest 3 2 3   Down, Depressed, Hopeless 3 2 3   PHQ - 2 Score 6 4 6   Altered sleeping 3 0 3  Tired, decreased energy 3 3 3   Change in appetite 3 2 3   Feeling bad or failure about yourself  0 0 0  Trouble concentrating 3 0 1  Moving slowly or fidgety/restless 1 0 0  Suicidal thoughts 0 0 0  PHQ-9 Score 19 9 16   Difficult doing work/chores Extremely dIfficult Somewhat difficult Extremely dIfficult  Some recent data might be hidden    -Last DEXA Scan: 03/10/18   T-Score femoral neck: -1.4  T-Score total hip: -0.5  T-Score lumbar spine: -0.3  Social History   Tobacco Use  Smoking Status Never  Smokeless Tobacco Never   BP Readings from Last 3 Encounters:  05/04/21 125/80  03/24/21 113/67  11/24/20 128/73   Pulse Readings from Last 3 Encounters:  05/04/21 85  03/24/21 71  11/24/20 70   Wt Readings from Last 3 Encounters:  05/04/21 184 lb 6.4 oz (83.6 kg)  03/24/21 185 lb 11.2 oz (84.2 kg)  11/24/20 185 lb 9.6 oz (84.2 kg)   BMI Readings from Last 3 Encounters:   05/04/21 32.66 kg/m  03/24/21 32.90 kg/m  11/24/20 32.88 kg/m    Assessment/Interventions: Review of patient past medical history, allergies, medications, health status, including review of consultants reports, laboratory and other test data, was performed as part of comprehensive evaluation and provision of chronic care management services.   SDOH:  (Social Determinants of Health) assessments and interventions performed: Yes SDOH Interventions    Flowsheet Row Most Recent Value  SDOH Interventions   Financial Strain Interventions Intervention Not Indicated      SDOH Screenings   Alcohol Screen: Low Risk    Last Alcohol  Screening Score (AUDIT): 0  Depression (PHQ2-9): Medium Risk   PHQ-2 Score: 19  Financial Resource Strain: Low Risk    Difficulty of Paying Living Expenses: Not hard at all  Food Insecurity: No Food Insecurity   Worried About Charity fundraiser in the Last Year: Never true   Ran Out of Food in the Last Year: Never true  Housing: Low Risk    Last Housing Risk Score: 0  Physical Activity: Inactive   Days of Exercise per Week: 0 days   Minutes of Exercise per Session: 0 min  Social Connections: Moderately Isolated   Frequency of Communication with Friends and Family: More than three times a week   Frequency of Social Gatherings with Friends and Family: Twice a week   Attends Religious Services: 1 to 4 times per year   Active Member of [employer] or Organizations: No   Attends Archivist Meetings: Never   Marital Status: Widowed  Stress: No Stress Concern Present   Feeling of Stress : Only a little  Tobacco Use: Low Risk    Smoking Tobacco Use: Never   Smokeless Tobacco Use: Never   Passive Exposure: Not on file  Transportation Needs: No Transportation Needs   Lack of Transportation (Medical): No   Lack of Transportation (Non-Medical): No    CCM Care Plan  Allergies  Allergen Reactions   Sulfa Antibiotics     Medications Reviewed Today      Reviewed by Germaine Pomfret, [facility] (Pharmacist) on 05/22/21 at 1414  Med List Status: <None>   Medication Order Taking? Sig Documenting Provider Last Dose Status Informant  acetaminophen (TYLENOL) 500 MG tablet [id number] Yes Take 500 mg by mouth every 6 (six) hours as needed for mild pain or headache. [provider] Taking Active   allopurinol (ZYLOPRIM) 100 MG tablet [id number] Yes TAKE 1 TABLET BY MOUTH EVERY DAY [name], [name] Taking Active   atorvastatin (LIPITOR) 40 MG tablet [id number] Yes TAKE 1 TABLET BY MOUTH EVERY DAY Bacigalupo, Dionne Bucy, MD Taking Active   B Complex Vitamins (GNP VITAMIN B COMPLEX PO) [id number] Yes Take by mouth daily.  [provider] Taking Active   Blood Glucose Monitoring Suppl [facility] VERIO) w/Device KIT [id number]  To check blood sugar once daily [name] M, [place]  Active   buPROPion (WELLBUTRIN XL) 150 MG 24 hr tablet [id number] Yes Take 1 tablet (150 mg total) by mouth daily. Tally Joe T, [name] Taking Active   escitalopram (LEXAPRO) 20 MG tablet [id number] Yes TAKE 1 TABLET BY MOUTH EVERY DAY [name], [name] Taking Active   famotidine (PEPCID) 40 MG tablet [id number] Yes Take 1 tablet (40 mg total) by mouth daily.  Patient taking differently: Take 40 mg by mouth daily as needed.   Mar Daring, PA-C Taking Active   ferrous sulfate 325 (65 FE) MG EC tablet [id number] Yes Take 325 mg by mouth daily. [provider] Taking Active   furosemide (LASIX) 40 MG tablet [id number] Yes Take 1 tablet (40 mg total) by mouth daily. [name] M, PA-C Taking Active   glipiZIDE (GLUCOTROL XL) 10 MG 24 hr tablet [id number] Yes TAKE 1 TABLET (10 MG TOTAL) BY MOUTH DAILY WITH BREAKFAST. [name], [name] Taking Active   Lancets [facility] ULTRASOFT) lancets [phone]  To check blood sugar once daily [place]  Active   levothyroxine (SYNTHROID) 75 MCG tablet [id number] Yes Take 1 tablet (75 mcg total)  by mouth daily.  Mar Daring, PA-C Taking Active   metFORMIN (GLUCOPHAGE-XR) 500 MG 24 hr tablet [id number] Yes TAKE 2 TABLETS BY MOUTH EVERY DAY WITH BREAKFAST Virginia Crews, MD Taking Active   [facility] VERIO test strip [id number]  TO CHECK BLOOD SUGAR ONCE DAILY Virginia Crews, MD  Active   Polyethyl Glycol-Propyl Glycol (SYSTANE HYDRATION PF OP) [id number] Yes Place 1 drop into both eyes daily as needed (for dry eyes). [provider] Taking Active     Discontinued 05/22/21 1413 (Completed Course)   traZODone (DESYREL) 50 MG tablet [id number] Yes Take 1 tablet (50 mg total) by mouth at bedtime. [name], [name] Taking Active   Vitamin D, Ergocalciferol, (DRISDOL) 1.25 MG (50000 UNIT) CAPS capsule [id number] Yes Take 1 capsule (50,000 Units total) by mouth every 7 (seven) days. Mar Daring, [place] Taking Active             Patient Active Problem List   Diagnosis Date Noted   Type 2 diabetes mellitus with stage 4 chronic kidney disease, with long-term current use of insulin ([place]) 05/05/2021   Positive screening for depression on 2-item Patient Health Questionnaire (PHQ-2) 05/04/2021   Severe episode of recurrent major depressive disorder, without psychotic features ([place]) 05/04/2021   Psychophysiological insomnia 05/04/2021   Diabetes mellitus due to underlying condition with stage 4 chronic kidney disease, with long-term current use of insulin ([place]) 05/04/2021   Hyperlipidemia associated with type 2 diabetes mellitus ([place]) 05/04/2021   Need for influenza vaccination 05/04/2021   Need for shingles vaccine 05/04/2021   Need for tetanus booster 05/04/2021   Right arm pain 10/22/2019   Degenerative arthritis of knee, bilateral 05/13/2018   Hypertension associated with diabetes ([place]) 04/01/2015   Leukocytosis 12/22/2014   Abnormal finding on mammography 09/21/2014   Obesity 09/21/2014   Chronic kidney disease, stage III (moderate) ([place]) 09/21/2014   Type  2 diabetes mellitus with stage 3b chronic kidney disease, without long-term current use of insulin ([place]) 09/21/2014   Accumulation of fluid in tissues 09/21/2014   Esophageal reflux 09/21/2014   Gout 09/21/2014   Hypercholesteremia 09/21/2014   Insomnia 09/21/2014   Vitamin D deficiency, unspecified 09/21/2014   Hypothyroidism 01/15/2014   Depressive disorder 01/15/2014   Low back pain 01/15/2014   Other chest pain 01/15/2014    Immunization History  Administered Date(s) Administered   Fluad Quad(high Dose 65+) 04/20/2019, 05/04/2021   Influenza Split 12/16/2009, 03/21/2011, 01/30/2012   Influenza, High Dose Seasonal PF 02/09/2015, 03/20/2016, 12/24/2016, 02/07/2018   Influenza,inj,Quad PF,6+ Mos 02/06/2013, 12/03/2013   PFIZER(Purple Top)SARS-COV-2 Vaccination 06/19/2019, 07/14/2019, 03/18/2020   Pneumococcal Conjugate-13 12/03/2013   Pneumococcal Polysaccharide-23 03/21/2011   Tdap 04/09/2008, 05/04/2021   Zoster Recombinat (Shingrix) 05/04/2021    Conditions to be addressed/monitored:  Hypertension, Hyperlipidemia, Diabetes, GERD, Chronic Kidney Disease, Hypothyroidism, Depression, Gout, and Insomnia  Care Plan : General Pharmacy (Adult)  Updates made by Germaine Pomfret, RPH since 06/01/2021 12:00 AM     Problem: Hypertension, Hyperlipidemia, Diabetes, GERD, Chronic Kidney Disease, Hypothyroidism, Depression, Gout, and Insomnia   Priority: High     Long-Range Goal: Patient-Specific Goal   Start Date: 06/01/2021  Expected End Date: 06/02/2022  This Visit's Progress: On track  Priority: High  Note:   Current Barriers:  No barriers noted  Pharmacist Clinical Goal(s):  Patient will maintain control of diabetes as evidenced by A1c less than 8%  through collaboration with PharmD and provider.   Interventions: 1:1 collaboration with [name], [name] regarding development and  update of comprehensive plan of care as evidenced by provider attestation and  co-signature Inter-disciplinary care team collaboration (see longitudinal plan of care) Comprehensive medication review performed; medication list updated in electronic medical record  Hypertension (BP goal <140/90) -Controlled -Current treatment: Furosemide 40 mg daily: Appropriate, Effective, Safe, Accessible   -Medications previously tried: Lisinopril  -Current home readings: Typically 120s/60 -Denies hypotensive/hypertensive symptoms -Recommended to continue current medication  Hyperlipidemia: (LDL goal < 70) -Not ideally controlled -Current treatment: Atorvastatin 40 mg daily: Appropriate, Effective, Safe, Accessible  -Medications previously tried: Administrator ([place])  -Start Krill Oil 1000 mg daily for hypertriglyceridemia.   Diabetes (A1c goal <8%) -Controlled -Current medications: Glipizide XL 10 mg daily: Appropriate, Effective, Safe, Accessible  Metformin XR 500 mg 2 tablets daily: Appropriate, Effective, Query Safe -Medications previously tried: Januvia  -Patient does not qualify for CGM at this time.  -Current home glucose readings fasting glucose: 111, 109, 98, 106, 121, 120 -Denies hypoglycemic/hyperglycemic symptoms -Recommended to continue current medication  Depression/Anxiety (Goal: Achieve symptom) -Not ideally controlled -Current treatment: Bupropion XL 150 mg daily: Appropriate, Effective, Safe, Accessible  Escitalopram 20 mg daily: Appropriate, Effective, Safe, Accessible  Trazodone 50 mg nightly: Appropriate, Effective, Safe, Accessible  -Medications previously tried/failed: NA -Low energy, motivation since her husband past in 2021.  -Sometimes did not sleep until 4-6 am. Trazodone helped significantly  -Recommended to continue current medication  GERD (Goal: Minimize heartburn) -Controlled -Current treatment  Famotidine 40 mg daily: Appropriate, Effective, Safe, Accessible  -Medications previously tried: NA  -Recommended to continue current  medication  Gout (Goal: Prevent gout flares) -Controlled -Last Gout Flare: <2-3 years ago  -Current treatment  Allopurinol 100 mg daily: Appropriate, Effective, Safe, Accessible  -Medications previously tried: NA  -We discussed:  Counseled patient on low purine diet plan. Counseled patient to reduce consumption of high-fructose corn syrup, sweetened soft drinks, fruit juices, meat, and seafood. -Recommended to continue current medication  Osteopenia (Goal Prevent fractures) -Controlled -Patient is not a candidate for pharmacologic treatment -Current treatment  Ergocalciferol 50,000 units weekly: Appropriate, Effective, Safe, Accessible -Medications previously tried: NA  -Recommended to continue current medication  Chronic Kidney Disease Stage 4  -Previously was following Dr. Candiss Norse, will follow-up back in March.  -All medications assessed for renal dosing and appropriateness in chronic kidney disease. -Query safe use of metformin in CKD 4.  -Recommended to continue current medication   Patient Goals/Self-Care Activities Patient will:  - check glucose daily before breakfast, document, and provide at future appointments  Follow Up Plan: Telephone follow up appointment with care management team member scheduled for:  07/04/2021 at 3:00 PM      Medication Assistance: None required.  Patient affirms current coverage meets needs.  Compliance/Adherence/Medication fill history: Care Gaps: Covid-19 Vaccine Ophthalmology Exam  Star-Rating Drugs: Glipizide 10 mg last filled 04/12/2021 [facility]. Metformin 500 mg last filled 03/10/2021 [facility] Atorvastatin 40 mg last filled 03/19/2021 [facility].  Patient's preferred pharmacy [facility]:  [id number] [place] [id number]: [phone]: [phone] Uses pill box? Yes Pt endorses 100% compliance  We  discussed: Current pharmacy is preferred with insurance plan and patient is satisfied with pharmacy services Patient decided to: Continue current medication management strategy  Care Plan and Follow Up Patient Decision:  Patient agrees to Care Plan and Follow-up.  Plan: Telephone follow up appointment with care management team  member scheduled for:  07/04/2021 at 3:00 PM  Junius Argyle, PharmD, [place], [place] Pharmacist Practitioner  [facility] [phone]

## 2021-05-25 ENCOUNTER — Ambulatory Visit: Payer: Medicare Other | Admitting: Oncology

## 2021-05-25 ENCOUNTER — Other Ambulatory Visit: Payer: Self-pay | Admitting: Physician Assistant

## 2021-05-25 DIAGNOSIS — E559 Vitamin D deficiency, unspecified: Secondary | ICD-10-CM

## 2021-05-30 DIAGNOSIS — F32A Depression, unspecified: Secondary | ICD-10-CM | POA: Diagnosis not present

## 2021-05-30 DIAGNOSIS — E7800 Pure hypercholesterolemia, unspecified: Secondary | ICD-10-CM

## 2021-05-30 DIAGNOSIS — N184 Chronic kidney disease, stage 4 (severe): Secondary | ICD-10-CM

## 2021-05-30 DIAGNOSIS — E1159 Type 2 diabetes mellitus with other circulatory complications: Secondary | ICD-10-CM | POA: Diagnosis not present

## 2021-05-30 DIAGNOSIS — Z794 Long term (current) use of insulin: Secondary | ICD-10-CM

## 2021-05-30 DIAGNOSIS — I152 Hypertension secondary to endocrine disorders: Secondary | ICD-10-CM

## 2021-05-30 DIAGNOSIS — E0822 Diabetes mellitus due to underlying condition with diabetic chronic kidney disease: Secondary | ICD-10-CM | POA: Diagnosis not present

## 2021-05-30 DIAGNOSIS — E034 Atrophy of thyroid (acquired): Secondary | ICD-10-CM

## 2021-06-01 ENCOUNTER — Other Ambulatory Visit: Payer: Self-pay | Admitting: Family Medicine

## 2021-06-01 NOTE — Telephone Encounter (Signed)
Requested Prescriptions  ?Pending Prescriptions Disp Refills  ?? metFORMIN (GLUCOPHAGE-XR) 500 MG 24 hr tablet [Pharmacy Med Name: METFORMIN HCL ER 500 MG TABLET] 180 tablet 0  ?  Sig: TAKE 2 TABLETS BY MOUTH EVERY DAY WITH BREAKFAST  ?  ? Endocrinology:  Diabetes - Biguanides Failed - 06/01/2021  8:28 AM  ?  ?  Failed - Cr in normal range and within 360 days  ?  Creatinine, Ser  ?Date Value Ref Range Status  ?05/04/2021 1.82 (H) 0.57 - 1.00 mg/dL Final  ?   ?  ?  Failed - eGFR in normal range and within 360 days  ?  GFR calc Af Amer  ?Date Value Ref Range Status  ?04/27/2020 42 (L) >59 mL/min/1.73 Final  ?  Comment:  ?  **In accordance with recommendations from the NKF-ASN Task force,** ?  [facility] is in the process of updating its eGFR calculation to the ?  2021 CKD-EPI creatinine equation that estimates kidney function ?  without a race variable. ?  ? ?GFR, Estimated  ?Date Value Ref Range Status  ?03/17/2021 25 (L) >60 mL/min Final  ?  Comment:  ?  (NOTE) ?Calculated using the CKD-EPI Creatinine Equation (2021) ?  ? ?eGFR  ?Date Value Ref Range Status  ?05/04/2021 28 (L) >59 mL/min/1.73 Final  ?   ?  ?  Passed - HBA1C is between 0 and 7.9 and within 180 days  ?  Hgb A1c MFr Bld  ?Date Value Ref Range Status  ?05/04/2021 6.7 (H) 4.8 - 5.6 % Final  ?  Comment:  ?           Prediabetes: 5.7 - 6.4 ?         Diabetes: >6.4 ?         Glycemic control for adults with diabetes: <7.0 ?  ?   ?  ?  Passed - B12 Level in normal range and within 720 days  ?  Vitamin B-12  ?Date Value Ref Range Status  ?03/17/2021 246 180 - 914 pg/mL Final  ?  Comment:  ?  (NOTE) ?This assay is not validated for testing neonatal or ?myeloproliferative syndrome specimens for Vitamin B12 levels. ?Performed at [facility], [place] [address] [place], [place] ?[address] ?  ?   ?  ?  Passed - Valid encounter within last 6 months  ?  Recent Outpatient Visits   ?      ? 4 weeks ago Hypertension associated with diabetes ([place])  ? [facility] [name], [name]  ? 8 months ago Type 2 diabetes mellitus with stage 3b chronic kidney disease, without long-term current use of insulin ([place])  ? [facility] Bacigalupo, Dionne Bucy, MD  ? 1 year ago Encounter for Medicare annual wellness exam  ? [facility] [place], [place], LPN  ? 1 year ago Acute non-recurrent pansinusitis  ? [place], [place]  ? 1 year ago Essential hypertension  ? [place], [place]  ?  ?  ?Future Appointments   ?        ? In 2 weeks [name], [place], PEC  ?  ? ?  ?  ?  Passed - CBC within normal limits and completed in the last 12 months  ?  WBC  ?Date Value Ref Range Status  ?03/17/2021 9.6 4.0 - 10.5 K/uL Final  ? ?RBC  ?Date Value Ref  Range Status  ?03/17/2021 3.54 (L) 3.87 - 5.11 MIL/uL Final  ? ?Hemoglobin  ?Date Value Ref Range Status  ?03/17/2021 11.8 (L) 12.0 - 15.0 g/dL Final  ?09/28/2020 12.6 11.1 - 15.9 g/dL Final  ? ?HCT  ?Date Value Ref Range Status  ?03/17/2021 35.9 (L) 36.0 - 46.0 % Final  ? ?Hematocrit  ?Date Value Ref Range Status  ?09/28/2020 35.8 34.0 - 46.6 % Final  ? ?MCHC  ?Date Value Ref Range Status  ?03/17/2021 32.9 30.0 - 36.0 g/dL Final  ? ?MCH  ?Date Value Ref Range Status  ?03/17/2021 33.3 26.0 - 34.0 pg Final  ? ?MCV  ?Date Value Ref Range Status  ?03/17/2021 101.4 (H) 80.0 - 100.0 fL Final  ?09/28/2020 94 79 - 97 fL Final  ? ?No results found for: PLTCOUNTKUC, LABPLAT, [place] ?RDW  ?Date Value Ref Range Status  ?03/17/2021 14.5 11.5 - 15.5 % Final  ?09/28/2020 12.6 11.7 - 15.4 % Final  ? ?  ?  ?  ? ? ?

## 2021-06-01 NOTE — Patient Instructions (Signed)
Visit Information It was great speaking with you today!  Please let me know if you have any questions about our visit.   Goals Addressed             This Visit's Progress    Monitor and Manage My Blood Sugar-Diabetes Type 2       Timeframe:  Long-Range Goal Priority:  High Start Date: 06/01/2021                            Expected End Date: 06/02/2022                      Follow Up within 30 days   - check blood sugar at prescribed times - check blood sugar if I feel it is too high or too low - enter blood sugar readings and medication or insulin into daily log - take the blood sugar log to all doctor visits    Why is this important?   Checking your blood sugar at home helps to keep it from getting very high or very low.  Writing the results in a diary or log helps the doctor know how to care for you.  Your blood sugar log should have the time, date and the results.  Also, write down the amount of insulin or other medicine that you take.  Other information, like what you ate, exercise done and how you were feeling, will also be helpful.     Notes:         Patient Care Plan: General Pharmacy (Adult)     Problem Identified: Hypertension, Hyperlipidemia, Diabetes, GERD, Chronic Kidney Disease, Hypothyroidism, Depression, Gout, and Insomnia   Priority: High     Long-Range Goal: Patient-Specific Goal   Start Date: 06/01/2021  Expected End Date: 06/02/2022  This Visit's Progress: On track  Priority: High  Note:   Current Barriers:  No barriers noted  Pharmacist Clinical Goal(s):  Patient will maintain control of diabetes as evidenced by A1c less than 8%  through collaboration with PharmD and provider.   Interventions: 1:1 collaboration with [name], [name] regarding development and update of comprehensive plan of care as evidenced by provider attestation and co-signature Inter-disciplinary care team collaboration (see longitudinal plan of care) Comprehensive medication  review performed; medication list updated in electronic medical record  Hypertension (BP goal <140/90) -Controlled -Current treatment: Furosemide 40 mg daily: Appropriate, Effective, Safe, Accessible   -Medications previously tried: Lisinopril  -Current home readings: Typically 120s/60 -Denies hypotensive/hypertensive symptoms -Recommended to continue current medication  Hyperlipidemia: (LDL goal < 70) -Not ideally controlled -Current treatment: Atorvastatin 40 mg daily: Appropriate, Effective, Safe, Accessible  -Medications previously tried: Administrator ([place])  -Start Krill Oil 1000 mg daily for hypertriglyceridemia.   Diabetes (A1c goal <8%) -Controlled -Current medications: Glipizide XL 10 mg daily: Appropriate, Effective, Safe, Accessible  Metformin XR 500 mg 2 tablets daily: Appropriate, Effective, Query Safe -Medications previously tried: Januvia  -Patient does not qualify for CGM at this time.  -Current home glucose readings fasting glucose: 111, 109, 98, 106, 121, 120 -Denies hypoglycemic/hyperglycemic symptoms -Recommended to continue current medication  Depression/Anxiety (Goal: Achieve symptom) -Not ideally controlled -Current treatment: Bupropion XL 150 mg daily: Appropriate, Effective, Safe, Accessible  Escitalopram 20 mg daily: Appropriate, Effective, Safe, Accessible  Trazodone 50 mg nightly: Appropriate, Effective, Safe, Accessible  -Medications previously tried/failed: NA -Low energy, motivation since her husband past in 2021.  -Sometimes did not  sleep until 4-6 am. Trazodone helped significantly  -Recommended to continue current medication  GERD (Goal: Minimize heartburn) -Controlled -Current treatment  Famotidine 40 mg daily: Appropriate, Effective, Safe, Accessible  -Medications previously tried: NA  -Recommended to continue current medication  Gout (Goal: Prevent gout flares) -Controlled -Last Gout Flare: <2-3 years ago  -Current treatment   Allopurinol 100 mg daily: Appropriate, Effective, Safe, Accessible  -Medications previously tried: NA  -We discussed:  Counseled patient on low purine diet plan. Counseled patient to reduce consumption of high-fructose corn syrup, sweetened soft drinks, fruit juices, meat, and seafood. -Recommended to continue current medication  Osteopenia (Goal Prevent fractures) -Controlled -Patient is not a candidate for pharmacologic treatment -Current treatment  Ergocalciferol 50,000 units weekly: Appropriate, Effective, Safe, Accessible -Medications previously tried: NA  -Recommended to continue current medication  Chronic Kidney Disease Stage 4  -Previously was following Dr. Candiss Norse, will follow-up back in March.  -All medications assessed for renal dosing and appropriateness in chronic kidney disease. -Query safe use of metformin in CKD 4.  -Recommended to continue current medication   Patient Goals/Self-Care Activities Patient will:  - check glucose daily before breakfast, document, and provide at future appointments  Follow Up Plan: Telephone follow up appointment with care management team member scheduled for:  07/04/2021 at 3:00 PM    Ms. Waldschmidt was given information about Chronic Care Management services today including:  CCM service includes personalized support from designated clinical staff supervised by her physician, including individualized plan of care and coordination with other care providers 24/7 contact phone numbers for assistance for urgent and routine care needs. Standard insurance, coinsurance, copays and deductibles apply for chronic care management only during months in which we provide at least 20 minutes of these services. Most insurances cover these services at 100%, however patients may be responsible for any copay, coinsurance and/or deductible if applicable. This service may help you avoid the need for more expensive face-to-face services. Only one practitioner may  furnish and bill the service in a calendar month. The patient may stop CCM services at any time (effective at the end of the month) by phone call to the office staff.  Patient agreed to services and verbal consent obtained.   Patient verbalizes understanding of instructions and care plan provided today and agrees to view in [place]. Active MyChart status confirmed with patient.    Junius Argyle, PharmD, [place], CPP  Clinical Pharmacist Practitioner  [facility] [phone]

## 2021-06-05 ENCOUNTER — Other Ambulatory Visit: Payer: Self-pay | Admitting: Family Medicine

## 2021-06-05 DIAGNOSIS — N1832 Chronic kidney disease, stage 3b: Secondary | ICD-10-CM

## 2021-06-05 DIAGNOSIS — E1122 Type 2 diabetes mellitus with diabetic chronic kidney disease: Secondary | ICD-10-CM

## 2021-06-06 NOTE — Telephone Encounter (Signed)
Requested medications are due for refill today.  yes ? ?Requested medications are on the active medications list.  yes ? ?Last refill. 04/12/2021 #60 0 refills ? ?Future visit scheduled.   yes ? ?Notes to clinic.  Pharmacy needs Dx code. ? ? ? ?Requested Prescriptions  ?Pending Prescriptions Disp Refills  ? glipiZIDE (GLUCOTROL XL) 10 MG 24 hr tablet [Pharmacy Med Name: GLIPIZIDE ER 10 MG TABLET] 60 tablet 0  ?  Sig: TAKE 1 TABLET (10 MG TOTAL) BY MOUTH DAILY WITH BREAKFAST.  ?  ? Endocrinology:  Diabetes - Sulfonylureas Failed - 06/05/2021  8:59 AM  ?  ?  Failed - Cr in normal range and within 360 days  ?  Creatinine, Ser  ?Date Value Ref Range Status  ?05/04/2021 1.82 (H) 0.57 - 1.00 mg/dL Final  ?  ?  ?  ?  Passed - HBA1C is between 0 and 7.9 and within 180 days  ?  Hgb A1c MFr Bld  ?Date Value Ref Range Status  ?05/04/2021 6.7 (H) 4.8 - 5.6 % Final  ?  Comment:  ?           Prediabetes: 5.7 - 6.4 ?         Diabetes: >6.4 ?         Glycemic control for adults with diabetes: <7.0 ?  ?  ?  ?  ?  Passed - Valid encounter within last 6 months  ?  Recent Outpatient Visits   ? ?      ? 1 month ago Hypertension associated with diabetes ([place])  ? [facility] [name], [name]  ? 8 months ago Type 2 diabetes mellitus with stage 3b chronic kidney disease, without long-term current use of insulin ([place])  ? [facility], Dionne Bucy, MD  ? 1 year ago Encounter for Medicare annual wellness exam  ? [facility] [place], [place], LPN  ? 1 year ago Acute non-recurrent pansinusitis  ? [place], [place]  ? 1 year ago Essential hypertension  ? [place], [place]  ? ?  ?  ?Future Appointments   ? ?        ? In 2 weeks [name], [place], [facility]  ? ?  ? ?  ?  ?  ?  ?

## 2021-06-14 ENCOUNTER — Inpatient Hospital Stay: Payer: Medicare Other | Attending: Oncology

## 2021-06-14 ENCOUNTER — Other Ambulatory Visit: Payer: Self-pay | Admitting: Physician Assistant

## 2021-06-14 ENCOUNTER — Other Ambulatory Visit: Payer: Self-pay

## 2021-06-14 DIAGNOSIS — E1122 Type 2 diabetes mellitus with diabetic chronic kidney disease: Secondary | ICD-10-CM | POA: Diagnosis not present

## 2021-06-14 DIAGNOSIS — N184 Chronic kidney disease, stage 4 (severe): Secondary | ICD-10-CM | POA: Insufficient documentation

## 2021-06-14 DIAGNOSIS — Z809 Family history of malignant neoplasm, unspecified: Secondary | ICD-10-CM | POA: Diagnosis not present

## 2021-06-14 DIAGNOSIS — I129 Hypertensive chronic kidney disease with stage 1 through stage 4 chronic kidney disease, or unspecified chronic kidney disease: Secondary | ICD-10-CM | POA: Diagnosis not present

## 2021-06-14 DIAGNOSIS — D539 Nutritional anemia, unspecified: Secondary | ICD-10-CM | POA: Insufficient documentation

## 2021-06-14 DIAGNOSIS — D72820 Lymphocytosis (symptomatic): Secondary | ICD-10-CM | POA: Insufficient documentation

## 2021-06-14 DIAGNOSIS — E038 Other specified hypothyroidism: Secondary | ICD-10-CM

## 2021-06-14 DIAGNOSIS — D472 Monoclonal gammopathy: Secondary | ICD-10-CM | POA: Diagnosis present

## 2021-06-14 DIAGNOSIS — Z90710 Acquired absence of both cervix and uterus: Secondary | ICD-10-CM | POA: Diagnosis not present

## 2021-06-14 LAB — CBC WITH DIFFERENTIAL/PLATELET
Abs Immature Granulocytes: 0.02 10*3/uL (ref 0.00–0.07)
Basophils Absolute: 0.1 10*3/uL (ref 0.0–0.1)
Basophils Relative: 1 %
Eosinophils Absolute: 0.3 10*3/uL (ref 0.0–0.5)
Eosinophils Relative: 3 %
HCT: 34.9 % — ABNORMAL LOW (ref 36.0–46.0)
Hemoglobin: 11.5 g/dL — ABNORMAL LOW (ref 12.0–15.0)
Immature Granulocytes: 0 %
Lymphocytes Relative: 24 %
Lymphs Abs: 2.4 10*3/uL (ref 0.7–4.0)
MCH: 33.4 pg (ref 26.0–34.0)
MCHC: 33 g/dL (ref 30.0–36.0)
MCV: 101.5 fL — ABNORMAL HIGH (ref 80.0–100.0)
Monocytes Absolute: 0.7 10*3/uL (ref 0.1–1.0)
Monocytes Relative: 7 %
Neutro Abs: 6.7 10*3/uL (ref 1.7–7.7)
Neutrophils Relative %: 65 %
Platelets: 221 10*3/uL (ref 150–400)
RBC: 3.44 MIL/uL — ABNORMAL LOW (ref 3.87–5.11)
RDW: 14.3 % (ref 11.5–15.5)
WBC: 10.2 10*3/uL (ref 4.0–10.5)
nRBC: 0 % (ref 0.0–0.2)

## 2021-06-15 DIAGNOSIS — D472 Monoclonal gammopathy: Secondary | ICD-10-CM | POA: Diagnosis not present

## 2021-06-15 LAB — KAPPA/LAMBDA LIGHT CHAINS
Kappa free light chain: 495.3 mg/L — ABNORMAL HIGH (ref 3.3–19.4)
Kappa, lambda light chain ratio: 9.95 — ABNORMAL HIGH (ref 0.26–1.65)
Lambda free light chains: 49.8 mg/L — ABNORMAL HIGH (ref 5.7–26.3)

## 2021-06-16 ENCOUNTER — Other Ambulatory Visit: Payer: Self-pay

## 2021-06-16 DIAGNOSIS — D472 Monoclonal gammopathy: Secondary | ICD-10-CM

## 2021-06-16 LAB — MULTIPLE MYELOMA PANEL, SERUM
Albumin SerPl Elph-Mcnc: 3.4 g/dL (ref 2.9–4.4)
Albumin/Glob SerPl: 1.1 (ref 0.7–1.7)
Alpha 1: 0.2 g/dL (ref 0.0–0.4)
Alpha2 Glob SerPl Elph-Mcnc: 1.5 g/dL — ABNORMAL HIGH (ref 0.4–1.0)
B-Globulin SerPl Elph-Mcnc: 0.9 g/dL (ref 0.7–1.3)
Gamma Glob SerPl Elph-Mcnc: 0.7 g/dL (ref 0.4–1.8)
Globulin, Total: 3.3 g/dL (ref 2.2–3.9)
IgA: 754 mg/dL — ABNORMAL HIGH (ref 64–422)
IgG (Immunoglobin G), Serum: 652 mg/dL (ref 586–1602)
IgM (Immunoglobulin M), Srm: 108 mg/dL (ref 26–217)
Total Protein ELP: 6.7 g/dL (ref 6.0–8.5)

## 2021-06-19 LAB — COMP PANEL: LEUKEMIA/LYMPHOMA: Immunophenotypic Profile: 0

## 2021-06-19 LAB — IFE+PROTEIN ELECTRO, 24-HR UR
% BETA, Urine: 77.9 %
ALPHA 1 URINE: 2.4 %
Albumin, U: 8.5 %
Alpha 2, Urine: 5.2 %
GAMMA GLOBULIN URINE: 6 %
M-SPIKE %, Urine: 62.7 % — ABNORMAL HIGH
M-Spike, Mg/24 Hr: 122 mg/24 hr — ABNORMAL HIGH
Total Protein, Urine-Ur/day: 195 mg/24 hr — ABNORMAL HIGH (ref 30–150)
Total Protein, Urine: 48.8 mg/dL
Total Volume: 400

## 2021-06-20 ENCOUNTER — Telehealth: Payer: Self-pay

## 2021-06-20 ENCOUNTER — Other Ambulatory Visit: Payer: Self-pay | Admitting: Nephrology

## 2021-06-20 ENCOUNTER — Other Ambulatory Visit (HOSPITAL_COMMUNITY): Payer: Self-pay | Admitting: Nephrology

## 2021-06-20 ENCOUNTER — Encounter: Payer: Self-pay | Admitting: Oncology

## 2021-06-20 ENCOUNTER — Inpatient Hospital Stay: Payer: Medicare Other | Admitting: Oncology

## 2021-06-20 ENCOUNTER — Other Ambulatory Visit: Payer: Self-pay

## 2021-06-20 VITALS — BP 106/71 | HR 67 | Temp 96.3°F | Resp 18 | Wt 183.6 lb

## 2021-06-20 DIAGNOSIS — D72820 Lymphocytosis (symptomatic): Secondary | ICD-10-CM

## 2021-06-20 DIAGNOSIS — D472 Monoclonal gammopathy: Secondary | ICD-10-CM

## 2021-06-20 DIAGNOSIS — N184 Chronic kidney disease, stage 4 (severe): Secondary | ICD-10-CM | POA: Diagnosis not present

## 2021-06-20 NOTE — Telephone Encounter (Signed)
error 

## 2021-06-20 NOTE — Progress Notes (Signed)
Pt here for follow up. No new concerns voiced.   

## 2021-06-20 NOTE — Progress Notes (Signed)
?Hematology/Oncology Progress note ?[phone] [phone] ?  ? ? ? ?Patient Care Team: ?[name], [name] as PCP - General (Family Medicine) ?Lorelee Cover., MD as Consulting Physician (Ophthalmology) ?Lovell Sheehan, MD as Consulting Physician (Orthopedic Surgery) ?Murlean Iba, MD (Nephrology) ?Earlie Server, MD as Consulting Physician (Oncology) ?Germaine Pomfret, [facility] (Pharmacist) ? ?REFERRING PROVIDER: ?[name], [name]  ?CHIEF COMPLAINTS/REASON FOR VISIT:  ?MGUS and monoclonal lymphocytosis.  ? ?HISTORY OF PRESENTING ILLNESS:  ?ZAKARI COUCHMAN is a  76 y.o.  female with PMH listed below who was referred to me for evaluation of leukocytosis ?Reviewed patient' recent labs obtained by PCP.  ? ?10/22/2019 CBC showed elevated white count of 11.3, predominantly lymphocytosis.  Normal hemoglobin and platelet count. ?Previous lab records reviewed. Leukocytosis onset of chronic, duration is since January 2021.   ?No aggravating or elevated factors. ?Associated symptoms or signs:  ?Denies weight loss, fever, chills,night sweats.  Endorses fatigue ?Smoking history: Never smoker ?History of recent oral steroid use or steroid injection: No recent steroid injections. ?History of recent infection: Denies ?Autoimmune disease history.  Denies ? ?Patient denies any chronic wound, prosthesis.  She has chronic knee arthritis. ? ?#  flow cytometry showed CD5+ , CD23 + B-cell population.  CLL/SLL phenotype.  CD38 negative, <1% leukocytes,<5000 cells/ul ?INTERVAL HISTORY ?Caileigh NAKETA DADDARIO is a 76 y.o. female who has above history reviewed by me today presents for follow up visit for MGUS and monoclonal lymphocytosis.  ?Patient did not sleep well last night.  Feels tired today.  Otherwise no new complaints. ? ?Review of Systems  ?Constitutional:  Positive for fatigue. Negative for appetite change, chills and fever.  ?HENT:   Negative for hearing loss and voice change.   ?Eyes:  Negative for eye  problems.  ?Respiratory:  Negative for chest tightness and cough.   ?Cardiovascular:  Negative for chest pain.  ?Gastrointestinal:  Negative for abdominal distention, abdominal pain and blood in stool.  ?Endocrine: Negative for hot flashes.  ?Genitourinary:  Negative for difficulty urinating and frequency.   ?Musculoskeletal:  Positive for arthralgias.  ?Skin:  Negative for itching and rash.  ?Neurological:  Negative for extremity weakness.  ?Hematological:  Negative for adenopathy.  ?Psychiatric/Behavioral:  Negative for confusion.   ? ? ?MEDICAL HISTORY:  ?Past Medical History:  ?Diagnosis Date  ? Asthma   ? Bone spur   ? heel  ? Depression   ? Diabetes mellitus without complication ([place])   ? Gout   ? Hypertension   ? ? ?SURGICAL HISTORY: ?Past Surgical History:  ?Procedure Laterality Date  ? ABDOMINAL HYSTERECTOMY  04/1970  ? partial  ? CHOLECYSTECTOMY  late 1990's  ? ? ?SOCIAL HISTORY: ?Social History  ? ?Socioeconomic History  ? Marital status: Widowed  ?  Spouse name: [name]  ? Number of children: 3  ? Years of education: Not on file  ? Highest education level: 12th grade  ?Occupational History  ? Occupation: retired  ?Tobacco Use  ? Smoking status: Never  ? Smokeless tobacco: Never  ?Vaping Use  ? Vaping Use: Never used  ?Substance and Sexual Activity  ? Alcohol use: No  ? Drug use: No  ? Sexual activity: Not on file  ?Other Topics Concern  ? Not on file  ?Social History Narrative  ? Not on file  ? ?Social Determinants of Health  ? ?Financial Resource Strain: Low Risk   ? Difficulty of Paying Living Expenses: Not hard at all  ?Food Insecurity: No Food  Insecurity  ? Worried About Charity fundraiser in the Last Year: Never true  ? Ran Out of Food in the Last Year: Never true  ?Transportation Needs: No Transportation Needs  ? Lack of Transportation (Medical): No  ? Lack of Transportation (Non-Medical): No  ?Physical Activity: Inactive  ? Days of Exercise per Week: 0 days  ? Minutes of Exercise per Session: 0  min  ?Stress: No Stress Concern Present  ? Feeling of Stress : Only a little  ?Social Connections: Moderately Isolated  ? Frequency of Communication with Friends and Family: More than three times a week  ? Frequency of Social Gatherings with Friends and Family: Twice a week  ? Attends Religious Services: 1 to 4 times per year  ? Active Member of Clubs or Organizations: No  ? Attends Archivist Meetings: Never  ? Marital Status: Widowed  ?Intimate Partner Violence: Not At Risk  ? Fear of Current or Ex-Partner: No  ? Emotionally Abused: No  ? Physically Abused: No  ? Sexually Abused: No  ? ? ?FAMILY HISTORY: ?Family History  ?Problem Relation Age of Onset  ? CAD Mother   ? Heart attack Mother   ? Heart disease Father   ? Diabetes Cousin   ? Cancer Cousin   ? ? ?ALLERGIES:  is allergic to sulfa antibiotics. ? ?MEDICATIONS:  ?Current Outpatient Medications  ?Medication Sig Dispense Refill  ? acetaminophen (TYLENOL) 500 MG tablet Take 500 mg by mouth every 6 (six) hours as needed for mild pain or headache.    ? allopurinol (ZYLOPRIM) 100 MG tablet TAKE 1 TABLET BY MOUTH EVERY DAY 90 tablet 1  ? atorvastatin (LIPITOR) 40 MG tablet TAKE 1 TABLET BY MOUTH EVERY DAY 90 tablet 2  ? B Complex Vitamins (GNP VITAMIN B COMPLEX PO) Take by mouth daily.     ? Blood Glucose Monitoring Suppl (ONETOUCH VERIO) w/Device KIT To check blood sugar once daily 1 kit 0  ? buPROPion (WELLBUTRIN XL) 150 MG 24 hr tablet Take 1 tablet (150 mg total) by mouth daily. 90 tablet 1  ? escitalopram (LEXAPRO) 20 MG tablet TAKE 1 TABLET BY MOUTH EVERY DAY 90 tablet 0  ? famotidine (PEPCID) 40 MG tablet TAKE 1 TABLET BY MOUTH EVERY DAY 90 tablet 3  ? ferrous sulfate 325 (65 FE) MG EC tablet Take 325 mg by mouth daily.    ? furosemide (LASIX) 40 MG tablet Take 1 tablet (40 mg total) by mouth daily. 90 tablet 3  ? glipiZIDE (GLUCOTROL XL) 10 MG 24 hr tablet TAKE 1 TABLET (10 MG TOTAL) BY MOUTH DAILY WITH BREAKFAST. 60 tablet 0  ? KRILL OIL PO Take  by mouth.    ? Lancets (ONETOUCH ULTRASOFT) lancets To check blood sugar once daily 100 each 12  ? levothyroxine (SYNTHROID) 75 MCG tablet TAKE 1 TABLET BY MOUTH EVERY DAY 90 tablet 3  ? MAGNESIUM-OXIDE PO Take by mouth.    ? Multiple Vitamins-Minerals (PRESERVISION AREDS 2) CAPS Take 1 capsule by mouth in the morning and at bedtime.    ? ONETOUCH VERIO test strip TO CHECK BLOOD SUGAR ONCE DAILY 100 strip 12  ? Polyethyl Glycol-Propyl Glycol (SYSTANE HYDRATION PF OP) Place 1 drop into both eyes daily as needed (for dry eyes).    ? traZODone (DESYREL) 50 MG tablet Take 1 tablet (50 mg total) by mouth at bedtime. 90 tablet 1  ? Vitamin D, Ergocalciferol, (DRISDOL) 1.25 MG (50000 UNIT) CAPS capsule TAKE 1 CAPSULE (  50,000 UNITS TOTAL) BY MOUTH EVERY 7 (SEVEN) DAYS 12 capsule 3  ? ?No current facility-administered medications for this visit.  ? ? ? ?PHYSICAL EXAMINATION: ?ECOG PERFORMANCE STATUS: 1 - Symptomatic but completely ambulatory ?Vitals:  ? 06/20/21 1012  ?BP: 106/71  ?Pulse: 67  ?Resp: 18  ?Temp: (!) 96.3 ?F (35.7 ?C)  ?SpO2: 97%  ? ?Filed Weights  ? 06/20/21 1012  ?Weight: 183 lb 9.6 oz (83.3 kg)  ? ? ?Physical Exam ?Constitutional:   ?   General: She is not in acute distress. ?   Comments: Patient walks independently  ?HENT:  ?   Head: Normocephalic and atraumatic.  ?Eyes:  ?   General: No scleral icterus. ?Cardiovascular:  ?   Rate and Rhythm: Normal rate and regular rhythm.  ?   Heart sounds: Normal heart sounds.  ?Pulmonary:  ?   Effort: Pulmonary effort is normal. No respiratory distress.  ?   Breath sounds: No wheezing.  ?Abdominal:  ?   General: Bowel sounds are normal. There is no distension.  ?   Palpations: Abdomen is soft.  ?Musculoskeletal:     ?   General: No deformity. Normal range of motion.  ?   Cervical back: Normal range of motion and neck supple.  ?Skin: ?   General: Skin is warm and dry.  ?   Findings: No erythema or rash.  ?Neurological:  ?   Mental Status: She is alert and oriented to  person, place, and time. Mental status is at baseline.  ?   Cranial Nerves: No cranial nerve deficit.  ?   Coordination: Coordination normal.  ?Psychiatric:     ?   Mood and Affect: Mood normal.  ? ? ?CMP Late

## 2021-06-20 NOTE — Telephone Encounter (Signed)
BMB faxed to speciality scheduling to be done ASAP.  ?

## 2021-06-20 NOTE — Progress Notes (Signed)
Vivien Rota DeSanto,acting as a scribe for [name], [name].,have documented all relevant documentation on the behalf of [name], [name],as directed by  [name], [name] while in the presence of [name], [name].   Established patient visit   Patient: Donna Malone   DOB: [birth year]   76 y.o. Female  MRN: [id number] Visit Date: 06/21/2021  Today's healthcare provider: [name], [name]  Introduced to nurse practitioner role and practice setting.  All questions answered.  Discussed provider/patient relationship and expectations.   Subjective    HPI  Anxiety, Follow-up  She was last seen for anxiety 6 weeks ago. Changes made at last visit include starting Bupropion.   She reports good compliance with treatment. She reports good tolerance of treatment. She is not having side effects.   She feels her anxiety is moderate and Unchanged since last visit.  Symptoms: No chest pain No difficulty concentrating  No dizziness Yes fatigue  No feelings of losing control No insomnia  No irritable No palpitations  No panic attacks No racing thoughts  Yes shortness of breath No sweating  No tremors/shakes    GAD-7 Results    06/21/2021    1:23 PM  GAD-7 Generalized Anxiety Disorder Screening Tool  1. Feeling Nervous, Anxious, or on Edge 1  2. Not Being Able to Stop or Control Worrying 0  3. Worrying Too Much About Different Things 0  4. Trouble Relaxing 0  5. Being So Restless it's Hard To Sit Still 0  6. Becoming Easily Annoyed or Irritable 1  7. Feeling Afraid As If Something Awful Might Happen 0  Total GAD-7 Score 2    PHQ-9 Scores    06/21/2021    1:22 PM 05/04/2021    1:20 PM 04/27/2021    8:24 AM  PHQ9 SCORE ONLY  PHQ-9 Total Score 8 19 9     ---------------------------------------------------------------------------------------------------      06/21/2021    1:22 PM 05/04/2021    1:20 PM 04/27/2021    8:24 AM  Depression screen PHQ 2/9  Decreased  Interest 2 3 2   Down, Depressed, Hopeless 1 3 2   PHQ - 2 Score 3 6 4   Altered sleeping 0 3 0  Tired, decreased energy 3 3 3   Change in appetite 2 3 2   Feeling bad or failure about yourself  0 0 0  Trouble concentrating 0 3 0  Moving slowly or fidgety/restless 0 1 0  Suicidal thoughts 0 0 0  PHQ-9 Score 8 19 9   Difficult doing work/chores Extremely dIfficult Extremely dIfficult Somewhat difficult    -----------------------------------------------------------------------------------------   Medications: Outpatient Medications Prior to Visit  Medication Sig   acetaminophen (TYLENOL) 500 MG tablet Take 500 mg by mouth every 6 (six) hours as needed for mild pain or headache.   allopurinol (ZYLOPRIM) 100 MG tablet TAKE 1 TABLET BY MOUTH EVERY DAY   atorvastatin (LIPITOR) 40 MG tablet TAKE 1 TABLET BY MOUTH EVERY DAY   B Complex Vitamins (GNP VITAMIN B COMPLEX PO) Take by mouth daily.    Blood Glucose Monitoring Suppl (ONETOUCH VERIO) w/Device KIT To check blood sugar once daily   buPROPion (WELLBUTRIN XL) 150 MG 24 hr tablet Take 1 tablet (150 mg total) by mouth daily.   escitalopram (LEXAPRO) 20 MG tablet TAKE 1 TABLET BY MOUTH EVERY DAY   famotidine (PEPCID) 40 MG tablet TAKE 1 TABLET BY MOUTH EVERY DAY   ferrous sulfate 325 (65 FE) MG EC  tablet Take 325 mg by mouth daily.   furosemide (LASIX) 40 MG tablet Take 1 tablet (40 mg total) by mouth daily.   glipiZIDE (GLUCOTROL XL) 10 MG 24 hr tablet TAKE 1 TABLET (10 MG TOTAL) BY MOUTH DAILY WITH BREAKFAST.   KRILL OIL PO Take by mouth.   Lancets (ONETOUCH ULTRASOFT) lancets To check blood sugar once daily   levothyroxine (SYNTHROID) 75 MCG tablet TAKE 1 TABLET BY MOUTH EVERY DAY   MAGNESIUM-OXIDE PO Take by mouth.   Multiple Vitamins-Minerals (PRESERVISION AREDS 2) CAPS Take 1 capsule by mouth in the morning and at bedtime.   ONETOUCH VERIO test strip TO CHECK BLOOD SUGAR ONCE DAILY   Polyethyl Glycol-Propyl Glycol (SYSTANE HYDRATION PF  OP) Place 1 drop into both eyes daily as needed (for dry eyes).   traZODone (DESYREL) 50 MG tablet Take 1 tablet (50 mg total) by mouth at bedtime.   Vitamin D, Ergocalciferol, (DRISDOL) 1.25 MG (50000 UNIT) CAPS capsule TAKE 1 CAPSULE (50,000 UNITS TOTAL) BY MOUTH EVERY 7 (SEVEN) DAYS   No facility-administered medications prior to visit.    Review of Systems      Objective    BP 127/76 (BP Location: Right Arm, Patient Position: Sitting, Cuff Size: Normal)   Pulse 68   Temp 98.4 F (36.9 C) (Oral)   Wt 182 lb (82.6 kg)   SpO2 99%   BMI 32.24 kg/m     Physical Exam Vitals and nursing note reviewed.  Constitutional:      General: She is not in acute distress.    Appearance: Normal appearance. She is obese. She is not ill-appearing, toxic-appearing or diaphoretic.  HENT:     Head: Normocephalic and atraumatic.  Cardiovascular:     Rate and Rhythm: Normal rate and regular rhythm.     Pulses: Normal pulses.     Heart sounds: Normal heart sounds. No murmur heard.   No friction rub. No gallop.  Pulmonary:     Effort: Pulmonary effort is normal. No respiratory distress.     Breath sounds: Normal breath sounds. No stridor. No wheezing, rhonchi or rales.  Chest:     Chest wall: No tenderness.  Abdominal:     General: Bowel sounds are normal.     Palpations: Abdomen is soft.  Musculoskeletal:        General: No swelling, tenderness, deformity or signs of injury. Normal range of motion.     Right lower leg: No edema.     Left lower leg: No edema.  Skin:    General: Skin is warm and dry.     Capillary Refill: Capillary refill takes less than 2 seconds.     Coloration: Skin is not jaundiced or pale.     Findings: No bruising, erythema, lesion or rash.  Neurological:     General: No focal deficit present.     Mental Status: She is alert and oriented to person, place, and time. Mental status is at baseline.     Cranial Nerves: No cranial nerve deficit.     Sensory: No sensory  deficit.     Motor: No weakness.     Coordination: Coordination normal.  Psychiatric:        Mood and Affect: Mood normal.        Behavior: Behavior normal.        Thought Content: Thought content normal.        Judgment: Judgment normal.     No results found for any visits on  06/21/21.  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, with long-term current use of insulin (HCC)    Due for A1c in early May, has appt to return Off Metformin Has connected with nephro; plan for kidney US and BM bx Has an appt for DM eye pics; just got new glasses Working "really hard" on diet; continues to drink 1 can of Pepsi per day Continue 10 mg Glipizide  Not on ACE or ARB- recommend restart of 5 mg Lisinopril  On 40 mg Lipitor UTD on vaccines and foot exam, UTD on urine micro        Other   Depression, recurrent (HCC) - Primary    PHQ improved from 19 to 8 today with addition of Wellbutrin 150 mg to Lexapro 20 Is well connected with specialists to assist with chronic health needs Denies SI or HI Continue both medication to assist Recommend use of counseling/therapy for additional support      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      Leilani Merl, [name], have reviewed all documentation for this visit. The documentation on 06/21/21 for the exam, diagnosis, procedures, and orders are all accurate and complete.    [name], [name]  [facility] [phone] (phone) [phone] (fax)  [facility]

## 2021-06-21 ENCOUNTER — Ambulatory Visit (INDEPENDENT_AMBULATORY_CARE_PROVIDER_SITE_OTHER): Payer: Medicare Other | Admitting: Family Medicine

## 2021-06-21 ENCOUNTER — Encounter: Payer: Self-pay | Admitting: Family Medicine

## 2021-06-21 VITALS — BP 127/76 | HR 68 | Temp 98.4°F | Wt 182.0 lb

## 2021-06-21 DIAGNOSIS — N184 Chronic kidney disease, stage 4 (severe): Secondary | ICD-10-CM

## 2021-06-21 DIAGNOSIS — F339 Major depressive disorder, recurrent, unspecified: Secondary | ICD-10-CM

## 2021-06-21 DIAGNOSIS — E1159 Type 2 diabetes mellitus with other circulatory complications: Secondary | ICD-10-CM | POA: Diagnosis not present

## 2021-06-21 DIAGNOSIS — D801 Nonfamilial hypogammaglobulinemia: Secondary | ICD-10-CM | POA: Insufficient documentation

## 2021-06-21 DIAGNOSIS — E0822 Diabetes mellitus due to underlying condition with diabetic chronic kidney disease: Secondary | ICD-10-CM | POA: Diagnosis not present

## 2021-06-21 DIAGNOSIS — Z794 Long term (current) use of insulin: Secondary | ICD-10-CM

## 2021-06-21 DIAGNOSIS — I152 Hypertension secondary to endocrine disorders: Secondary | ICD-10-CM

## 2021-06-21 DIAGNOSIS — D472 Monoclonal gammopathy: Secondary | ICD-10-CM

## 2021-06-21 HISTORY — DX: Nonfamilial hypogammaglobulinemia: D80.1

## 2021-06-21 NOTE — Assessment & Plan Note (Signed)
Due for A1c in early May, has appt to return ?Off Metformin ?Has connected with nephro; plan for kidney US and BM bx ?Has an appt for DM eye pics; just got new glasses ?Working "really hard" on diet; continues to drink 1 can of Pepsi per day ?Continue 10 mg Glipizide  ?Not on ACE or ARB- recommend restart of 5 mg Lisinopril  ?On 40 mg Lipitor ?UTD on vaccines and foot exam, UTD on urine micro ?

## 2021-06-21 NOTE — Assessment & Plan Note (Signed)
Chronic, stable 120/70s today; previous lower in 100s/60-70s ?Denies CP ?Denies SOB/ DOE ?Denies low blood pressure/hypotension ?Denies vision changes ?No LE Edema noted on exam ?Recommend restarting 5 mg Lisinopril, patient wishes to wait until she sees nephrology again to address; doesn't think that she "needs it" ?Seek emergent care if you develop chest pain or chest pressure ? ?

## 2021-06-21 NOTE — Assessment & Plan Note (Signed)
PHQ improved from 19 to 8 today with addition of Wellbutrin 150 mg to Lexapro 20 ?Is well connected with specialists to assist with chronic health needs ?Denies SI or HI ?Continue both medication to assist ?Recommend use of counseling/therapy for additional support ?

## 2021-06-21 NOTE — Assessment & Plan Note (Signed)
Chronic, stable ?Followed by oncology ?Will have bone marrow bx soon to assist with treatment options ?Continues to have fatigue ?Doing better with ADLs with use of Trazodone to assist with sleep at night, currently taking 50 mg QHS PRN ?

## 2021-06-22 NOTE — Telephone Encounter (Signed)
pt scheduled for CT BM BX thurs [id number] @ 8:30 arrive 7:30a. Please schedule MD 2 week after biopsy and inform pt of appts  ?

## 2021-06-27 NOTE — Progress Notes (Signed)
Spoke with patient 06/27/21 @ 11 am. Need to arrive 7:30 for 8:30 procedure, need to be NPO after midnight, hold diabetes medication morning of procedure, patient not on insulin or blood thinners, need for driver post procedure all reviewed with patient. Patient verbalized understanding. Patient states that her granddaughter will bring her for procedure and may leave until procedure is finished. Informed patient that we would need a reliable phone number to get in touch with her granddaughter on day of procedure. Patient verbalized understanding of this as well. ?

## 2021-06-28 ENCOUNTER — Other Ambulatory Visit: Payer: Self-pay | Admitting: Radiology

## 2021-06-28 NOTE — H&P (Signed)
? ?Chief Complaint: ?Patient was seen in consultation today for bone marrow biopsy with aspiration  ? ?Referring Physician(s): ?Yu,Zhou ? ?Supervising Physician: Daryll Brod ? ?Patient Status: [place] ? ?History of Present Illness: ?Donna Malone is a 76 y.o. female with a medical history significant for DM, chronic kidney disease and hypertension. She was referred to Oncology August 2021 for evaluation of leukocytosis and was diagnosed with monoclonal gammopathy of unknown significance. She has been followed with regular oncology visits and lab work. Recent labs demonstrated an increase in free light chain ratio and a 24-hour UPEP showed M protein of 122.  ? ?Interventional Radiology has been asked to evaluate this patient for an image-guided bone marrow biopsy with aspiration for further work up ? ?Past Medical History:  ?Diagnosis Date  ? Asthma   ? Bone spur   ? heel  ? Depression   ? Diabetes mellitus without complication ([place])   ? Gout   ? Hypertension   ? ? ?Past Surgical History:  ?Procedure Laterality Date  ? ABDOMINAL HYSTERECTOMY  04/1970  ? partial  ? CHOLECYSTECTOMY  late 1990's  ? ? ?Allergies: ?Sulfa antibiotics ? ?Medications: ?Prior to Admission medications   ?Medication Sig Start Date End Date Taking? Authorizing Provider  ?acetaminophen (TYLENOL) 500 MG tablet Take 500 mg by mouth every 6 (six) hours as needed for mild pain or headache.    [provider]  ?allopurinol (ZYLOPRIM) 100 MG tablet TAKE 1 TABLET BY MOUTH EVERY DAY 05/15/21   [name], [name]  ?atorvastatin (LIPITOR) 40 MG tablet TAKE 1 TABLET BY MOUTH EVERY DAY 12/23/20   Virginia Crews, MD  ?B Complex Vitamins (GNP VITAMIN B COMPLEX PO) Take by mouth daily.     [provider]  ?Blood Glucose Monitoring Suppl (ONETOUCH VERIO) w/Device KIT To check blood sugar once daily 04/20/19   Mar Daring, PA-C  ?buPROPion (WELLBUTRIN XL) 150 MG 24 hr tablet Take 1 tablet (150 mg total) by mouth  daily. 05/04/21   [name], [name]  ?escitalopram (LEXAPRO) 20 MG tablet TAKE 1 TABLET BY MOUTH EVERY DAY 05/15/21   [name], [name]  ?famotidine (PEPCID) 40 MG tablet TAKE 1 TABLET BY MOUTH EVERY DAY 05/24/21   [name], [name]  ?ferrous sulfate 325 (65 FE) MG EC tablet Take 325 mg by mouth daily.    [provider]  ?furosemide (LASIX) 40 MG tablet Take 1 tablet (40 mg total) by mouth daily. 04/25/20   Mar Daring, PA-C  ?glipiZIDE (GLUCOTROL XL) 10 MG 24 hr tablet TAKE 1 TABLET (10 MG TOTAL) BY MOUTH DAILY WITH BREAKFAST. 06/06/21   [name], [name]  ?KRILL OIL PO Take by mouth.    [provider]  ?Lancets Glory Rosebush ULTRASOFT) lancets To check blood sugar once daily 04/20/19   Mar Daring, PA-C  ?levothyroxine (SYNTHROID) 75 MCG tablet TAKE 1 TABLET BY MOUTH EVERY DAY 06/19/21   [name], [name]  ?MAGNESIUM-OXIDE PO Take by mouth.    [provider]  ?Multiple Vitamins-Minerals (PRESERVISION AREDS 2) CAPS Take 1 capsule by mouth in the morning and at bedtime.    [provider]  ?Roma Schanz test strip TO CHECK BLOOD SUGAR ONCE DAILY 06/27/20   Virginia Crews, MD  ?Polyethyl Glycol-Propyl Glycol (SYSTANE HYDRATION PF OP) Place 1 drop into both eyes daily as needed (for dry eyes).    [provider]  ?traZODone (DESYREL) 50 MG tablet Take  1 tablet (50 mg total) by mouth at bedtime. 05/04/21   [name], [name]  ?Vitamin D, Ergocalciferol, (DRISDOL) 1.25 MG (50000 UNIT) CAPS capsule TAKE 1 CAPSULE (50,000 UNITS TOTAL) BY MOUTH EVERY 7 (SEVEN) DAYS 05/26/21   [name], [name]  ?  ? ?Family History  ?Problem Relation Age of Onset  ? CAD Mother   ? Heart attack Mother   ? Heart disease Father   ? Diabetes Cousin   ? Cancer Cousin   ? ? ?Social History  ? ?Socioeconomic History  ? Marital status: Widowed  ?  Spouse name: [name]  ? Number of children: 3  ? Years of education: Not on file  ? Highest education level: 12th grade  ?Occupational  History  ? Occupation: retired  ?Tobacco Use  ? Smoking status: Never  ? Smokeless tobacco: Never  ?Vaping Use  ? Vaping Use: Never used  ?Substance and Sexual Activity  ? Alcohol use: No  ? Drug use: No  ? Sexual activity: Not on file  ?Other Topics Concern  ? Not on file  ?Social History Narrative  ? Not on file  ? ?Social Determinants of Health  ? ?Financial Resource Strain: Low Risk   ? Difficulty of Paying Living Expenses: Not hard at all  ?Food Insecurity: No Food Insecurity  ? Worried About Charity fundraiser in the Last Year: Never true  ? Ran Out of Food in the Last Year: Never true  ?Transportation Needs: No Transportation Needs  ? Lack of Transportation (Medical): No  ? Lack of Transportation (Non-Medical): No  ?Physical Activity: Inactive  ? Days of Exercise per Week: 0 days  ? Minutes of Exercise per Session: 0 min  ?Stress: No Stress Concern Present  ? Feeling of Stress : Only a little  ?Social Connections: Moderately Isolated  ? Frequency of Communication with Friends and Family: More than three times a week  ? Frequency of Social Gatherings with Friends and Family: Twice a week  ? Attends Religious Services: 1 to 4 times per year  ? Active Member of Clubs or Organizations: No  ? Attends Archivist Meetings: Never  ? Marital Status: Widowed  ? ? ?Review of Systems: A 12 point ROS discussed and pertinent positives are indicated in the HPI above.  All other systems are negative. ? ?Review of Systems  ?Constitutional:  Positive for fatigue. Negative for appetite change.  ?Respiratory:  Negative for cough and shortness of breath.   ?Cardiovascular:  Negative for chest pain and leg swelling.  ?Gastrointestinal:  Negative for abdominal pain, diarrhea, nausea and vomiting.  ?Neurological:  Negative for dizziness and headaches.  ? ?Vital Signs: ?BP 134/77   Pulse 72   Temp 97.7 ?F (36.5 ?C) (Oral)   Resp 17   Ht 5' 3"  (1.6 m)   Wt 179 lb (81.2 kg)   SpO2 96%   BMI 31.71 kg/m?  ? ?Physical  Exam ?Constitutional:   ?   General: She is not in acute distress. ?   Appearance: She is not ill-appearing.  ?HENT:  ?   Mouth/Throat:  ?   Mouth: Mucous membranes are moist.  ?   Pharynx: Oropharynx is clear.  ?Cardiovascular:  ?   Rate and Rhythm: Normal rate and regular rhythm.  ?   Pulses: Normal pulses.  ?   Heart sounds: Normal heart sounds.  ?Pulmonary:  ?   Effort: Pulmonary effort is normal.  ?   Breath sounds: Normal breath  sounds.  ?Abdominal:  ?   General: Bowel sounds are normal.  ?   Palpations: Abdomen is soft.  ?   Tenderness: There is no abdominal tenderness.  ?Skin: ?   General: Skin is warm and dry.  ?Neurological:  ?   Mental Status: She is alert and oriented to person, place, and time.  ? ? ?Imaging: ?No results found. ? ?Labs: ? ?CBC: ?Recent Labs  ?  09/28/20 ?1406 11/17/20 ?1256 03/17/21 ?1145 06/14/21 ?1140  ?WBC 10.8 7.2 9.6 10.2  ?HGB 12.6 11.0* 11.8* 11.5*  ?HCT 35.8 33.1* 35.9* 34.9*  ?PLT 303 226 268 221  ? ? ?COAGS: ?No results for input(s): INR, APTT in the last 8760 hours. ? ?BMP: ?Recent Labs  ?  10/28/20 ?1135 11/17/20 ?1256 03/17/21 ?1145 05/04/21 ?1408  ?NA 140 136 136 136  ?K 4.0 4.0 3.3* 3.6  ?CL 101 97* 101 99  ?CO2 19* 30 21* 20  ?GLUCOSE 198* 263* 171* 144*  ?BUN 25 21 33* 28*  ?CALCIUM 9.6 9.0 9.2 9.9  ?CREATININE 1.55* 1.54* 2.05* 1.82*  ?GFRNONAA  --  35* 25*  --   ? ? ?LIVER FUNCTION TESTS: ?Recent Labs  ?  10/28/20 ?1135 11/17/20 ?1256 03/17/21 ?1145 05/04/21 ?1408  ?BILITOT 0.3 0.7 0.2* 0.4  ?AST 11 19 21 16   ?ALT 8 11 12 11   ?ALKPHOS 92 70 92 126*  ?PROT 6.7 6.8 7.2 7.4  ?ALBUMIN 4.0 3.3* 3.8 4.1  ? ? ?TUMOR MARKERS: ?No results for input(s): AFPTM, CEA, CA199, CHROMGRNA in the last 8760 hours. ? ?Assessment and Plan: ? ?Monoclonal B-cell Lymphocytosis; MGUS: [name], 76 year old female, presents today to the [facility] Interventional Radiology department for an image-guided bone marrow biopsy with aspiration. ? ?Risks and benefits  of this procedure were discussed with the patient and/or patient's family including, but not limited to bleeding, infection, damage to adjacent structures or low yield requiring additional tests. ? ?All of the q

## 2021-06-29 ENCOUNTER — Ambulatory Visit
Admission: RE | Admit: 2021-06-29 | Discharge: 2021-06-29 | Disposition: A | Discharge status: Home / Self Care | Payer: Medicare Other | Source: Ambulatory Visit | Attending: Oncology | Admitting: Oncology

## 2021-06-29 ENCOUNTER — Other Ambulatory Visit: Payer: Self-pay

## 2021-06-29 DIAGNOSIS — Z8249 Family history of ischemic heart disease and other diseases of the circulatory system: Secondary | ICD-10-CM | POA: Diagnosis not present

## 2021-06-29 DIAGNOSIS — C9000 Multiple myeloma not having achieved remission: Secondary | ICD-10-CM | POA: Insufficient documentation

## 2021-06-29 DIAGNOSIS — D472 Monoclonal gammopathy: Secondary | ICD-10-CM | POA: Diagnosis present

## 2021-06-29 DIAGNOSIS — N189 Chronic kidney disease, unspecified: Secondary | ICD-10-CM | POA: Diagnosis not present

## 2021-06-29 DIAGNOSIS — I129 Hypertensive chronic kidney disease with stage 1 through stage 4 chronic kidney disease, or unspecified chronic kidney disease: Secondary | ICD-10-CM | POA: Diagnosis not present

## 2021-06-29 DIAGNOSIS — D539 Nutritional anemia, unspecified: Secondary | ICD-10-CM | POA: Diagnosis not present

## 2021-06-29 DIAGNOSIS — D72829 Elevated white blood cell count, unspecified: Secondary | ICD-10-CM | POA: Diagnosis not present

## 2021-06-29 DIAGNOSIS — Z833 Family history of diabetes mellitus: Secondary | ICD-10-CM | POA: Insufficient documentation

## 2021-06-29 DIAGNOSIS — R5383 Other fatigue: Secondary | ICD-10-CM | POA: Diagnosis not present

## 2021-06-29 DIAGNOSIS — E1122 Type 2 diabetes mellitus with diabetic chronic kidney disease: Secondary | ICD-10-CM | POA: Diagnosis not present

## 2021-06-29 LAB — CBC WITH DIFFERENTIAL/PLATELET
Abs Immature Granulocytes: 0.02 10*3/uL (ref 0.00–0.07)
Basophils Absolute: 0.1 10*3/uL (ref 0.0–0.1)
Basophils Relative: 1 %
Eosinophils Absolute: 0.3 10*3/uL (ref 0.0–0.5)
Eosinophils Relative: 3 %
HCT: 34.9 % — ABNORMAL LOW (ref 36.0–46.0)
Hemoglobin: 11.3 g/dL — ABNORMAL LOW (ref 12.0–15.0)
Immature Granulocytes: 0 %
Lymphocytes Relative: 37 %
Lymphs Abs: 3.4 10*3/uL (ref 0.7–4.0)
MCH: 33.4 pg (ref 26.0–34.0)
MCHC: 32.4 g/dL (ref 30.0–36.0)
MCV: 103.3 fL — ABNORMAL HIGH (ref 80.0–100.0)
Monocytes Absolute: 0.7 10*3/uL (ref 0.1–1.0)
Monocytes Relative: 8 %
Neutro Abs: 4.7 10*3/uL (ref 1.7–7.7)
Neutrophils Relative %: 51 %
Platelets: 218 10*3/uL (ref 150–400)
RBC: 3.38 MIL/uL — ABNORMAL LOW (ref 3.87–5.11)
RDW: 14.3 % (ref 11.5–15.5)
WBC: 9.1 10*3/uL (ref 4.0–10.5)
nRBC: 0 % (ref 0.0–0.2)

## 2021-06-29 LAB — GLUCOSE, CAPILLARY: Glucose-Capillary: 121 mg/dL — ABNORMAL HIGH (ref 70–99)

## 2021-06-29 MED ORDER — FENTANYL CITRATE (PF) 100 MCG/2ML IJ SOLN
INTRAMUSCULAR | Status: AC
  Filled 2021-06-29: qty 2

## 2021-06-29 MED ORDER — FENTANYL CITRATE (PF) 100 MCG/2ML IJ SOLN
INTRAMUSCULAR | Status: AC | PRN
  Administered 2021-06-29 (×2): 25 ug via INTRAVENOUS

## 2021-06-29 MED ORDER — HEPARIN SOD (PORK) LOCK FLUSH 100 UNIT/ML IV SOLN
INTRAVENOUS | Status: AC
  Filled 2021-06-29: qty 5

## 2021-06-29 MED ORDER — MIDAZOLAM HCL 2 MG/2ML IJ SOLN
INTRAMUSCULAR | Status: AC
  Filled 2021-06-29: qty 2

## 2021-06-29 MED ORDER — MIDAZOLAM HCL 2 MG/2ML IJ SOLN
INTRAMUSCULAR | Status: AC | PRN
Start: 2021-06-29 — End: 2021-06-29
  Administered 2021-06-29 (×2): 1 mg via INTRAVENOUS

## 2021-06-29 MED ORDER — SODIUM CHLORIDE 0.9 % IV SOLN: INTRAVENOUS | Status: DC

## 2021-06-29 NOTE — Procedures (Signed)
Interventional Radiology Procedure Note  Procedure: CT BM ASP AND CORE BX    Complications: None  Estimated Blood Loss:  MIN  Findings: 11 G CORE AND ASP    M. TREVOR Alexia Dinger, MD    

## 2021-06-30 ENCOUNTER — Telehealth: Payer: Self-pay

## 2021-06-30 ENCOUNTER — Ambulatory Visit: Payer: Medicare Other

## 2021-06-30 NOTE — Progress Notes (Signed)
Chronic Care Management APPOINTMENT REMINDER ? ? ?Rhyann T Brickey was reminded to have all medications, supplements and any blood glucose and blood pressure readings available for review with Junius Argyle, Pharm. D, at her telephone visit on 07/04/2021 at 3:00  pm. ? ? ?Patient confirm appointment. ? ? ?Bessie Kellihan,CPA ?Clinical Pharmacist Assistant ?[phone]  ? ? ?

## 2021-07-03 ENCOUNTER — Ambulatory Visit
Admission: RE | Admit: 2021-07-03 | Discharge: 2021-07-03 | Disposition: A | Discharge status: Home / Self Care | Payer: Medicare Other | Source: Ambulatory Visit | Attending: Nephrology | Admitting: Nephrology

## 2021-07-03 DIAGNOSIS — N184 Chronic kidney disease, stage 4 (severe): Secondary | ICD-10-CM | POA: Insufficient documentation

## 2021-07-04 ENCOUNTER — Ambulatory Visit (INDEPENDENT_AMBULATORY_CARE_PROVIDER_SITE_OTHER): Payer: Medicare Other

## 2021-07-04 DIAGNOSIS — E1169 Type 2 diabetes mellitus with other specified complication: Secondary | ICD-10-CM

## 2021-07-04 DIAGNOSIS — E1122 Type 2 diabetes mellitus with diabetic chronic kidney disease: Secondary | ICD-10-CM

## 2021-07-04 DIAGNOSIS — F32A Depression, unspecified: Secondary | ICD-10-CM

## 2021-07-04 NOTE — Progress Notes (Signed)
? ?Chronic Care Management ?Pharmacy Note ? ?07/05/2021 ?Name:  Donna Malone MRN:  [id number] DOB:  [birth year] ? ?Summary: ?Patient presents for CCM follow-up.  ?-Patient unsure if she has benefited from bupropion.  ?-tolerating krill oil well.   ? ?Recommendations/Changes made from today's visit: ?-Continue current medications  ?--Recommend rechecking fasting lipid panel ? ?Plan: ?CPP follow-up 1 month ? ? ?Subjective: ?Donna Malone is an 76 y.o. year old female who is a primary patient of [name], [name].  The CCM team was consulted for assistance with disease management and care coordination needs.   ? ?Engaged with patient by telephone for follow up visit in response to provider referral for pharmacy case management and/or care coordination services.  ? ?Consent to Services:  ?The patient was given information about Chronic Care Management services, agreed to services, and gave verbal consent prior to initiation of services.  Please see initial visit note for detailed documentation.  ? ?Patient Care Team: ?[name], [name] as PCP - General (Family Medicine) ?Lorelee Cover., MD as Consulting Physician (Ophthalmology) ?Lovell Sheehan, MD as Consulting Physician (Orthopedic Surgery) ?Murlean Iba, MD (Nephrology) ?Earlie Server, MD as Consulting Physician (Oncology) ?Germaine Pomfret, [facility] (Pharmacist) ? ?Recent office visits: ?05/05/2021 Tally Joe [name] (PCP) start prednisone Taper pack ?  ?05/04/2021 Tally Joe [name] (PCP) Start Wellbutrin 150 mg daily, start trazodone 50 mg daily,AMB Referral to [place], Lab work completed A1C -6.7%, Return in about 6 weeks  ?  ?04/27/2021 Kirke Shaggy LPN (PCP Office) Medicare Wellness completed, no medication changes noted ? ?Recent consult visits: ?06/19/21: Patient presented to Dr. Candiss Norse (Nephrology) for CKD consult.  ? ?03/24/2021 Dr. Tasia Catchings MD (Oncology) No Medication Changes noted ?  ?11/24/2020 Beckey Rutter NP (Oncology) No Medication  Changes noted, Follow up in 3 months ? ?Hospital visits: ?None in previous 6 months ? ? ?Objective: ? ?Lab Results  ?Component Value Date  ? CREATININE 1.82 (H) 05/04/2021  ? BUN 28 (H) 05/04/2021  ? EGFR 28 (L) 05/04/2021  ? GFRNONAA 25 (L) 03/17/2021  ? GFRAA 42 (L) 04/27/2020  ? NA 136 05/04/2021  ? K 3.6 05/04/2021  ? CALCIUM 9.9 05/04/2021  ? CO2 20 05/04/2021  ? GLUCOSE 144 (H) 05/04/2021  ? ? ?Lab Results  ?Component Value Date/Time  ? HGBA1C 6.7 (H) 05/04/2021 02:08 PM  ? HGBA1C 6.1 (A) 09/27/2020 11:37 AM  ? HGBA1C 6.5 (H) 04/27/2020 09:59 AM  ? MICROALBUR 50 04/20/2019 10:34 AM  ? MICROALBUR 20 04/09/2017 04:48 PM  ?  ?Last diabetic Eye exam: No results found for: HMDIABEYEEXA  ?Last diabetic Foot exam: No results found for: HMDIABFOOTEX  ? ?Lab Results  ?Component Value Date  ? CHOL 204 (H) 05/04/2021  ? HDL 33 (L) 05/04/2021  ? [place] 91 05/04/2021  ? TRIG 487 (H) 05/04/2021  ? CHOLHDL 6.2 (H) 05/04/2021  ? ? ? ?  Latest Ref Rng & Units 05/04/2021  ?  2:08 PM 03/17/2021  ? 11:45 AM 11/17/2020  ? 12:56 PM  ?Hepatic Function  ?Total Protein 6.0 - 8.5 g/dL 7.4   7.2   6.8    ?Albumin 3.7 - 4.7 g/dL 4.1   3.8   3.3    ?AST 0 - 40 IU/L _0 ?ALT 0 - 32 IU/L _1 ?Alk Phosphatase 44 - 121 IU/L 126   92   70    ?  Total Bilirubin 0.0 - 1.2 mg/dL 0.4   0.2   0.7    ? ? ?Lab Results  ?Component Value Date/Time  ? TSH 1.250 05/04/2021 02:08 PM  ? TSH 0.990 09/28/2020 02:06 PM  ? FREET4 1.30 05/04/2021 02:08 PM  ? ? ? ?  Latest Ref Rng & Units 06/29/2021  ?  7:38 AM 06/14/2021  ? 11:40 AM 03/17/2021  ? 11:45 AM  ?CBC  ?WBC 4.0 - 10.5 K/uL 9.1   10.2   9.6    ?Hemoglobin 12.0 - 15.0 g/dL 11.3   11.5   11.8    ?Hematocrit 36.0 - 46.0 % 34.9   34.9   35.9    ?Platelets 150 - 400 K/uL 218   221   268    ? ? ?Lab Results  ?Component Value Date/Time  ? VD25OH 81.9 09/28/2020 02:06 PM  ? VD25OH 74.2 04/27/2020 09:59 AM  ? ? ?Clinical ASCVD: No  ?The 10-year ASCVD risk score (Arnett DK, et al., 2019) is:  26.5% ?  Values used to calculate the score: ?    Age: 70 years ?    Sex: Female ?    Is Non-Hispanic African American: No ?    Diabetic: Yes ?    Tobacco smoker: No ?    Systolic Blood Pressure: 92 mmHg ?    Is BP treated: Yes ?    HDL Cholesterol: 33 mg/dL ?    Total Cholesterol: 204 mg/dL   ? ? ?  06/21/2021  ?  1:22 PM 05/04/2021  ?  1:20 PM 04/27/2021  ?  8:24 AM  ?Depression screen PHQ 2/9  ?Decreased Interest _0 ?Down, Depressed, Hopeless _1 ?PHQ - 2 Score _2 ?Altered sleeping 0 3 0  ?Tired, decreased energy _3 ?Change in appetite _4 ?Feeling bad or failure about yourself  0 0 0  ?Trouble concentrating 0 3 0  ?Moving slowly or fidgety/restless 0 1 0  ?Suicidal thoughts 0 0 0  ?PHQ-9 Score _5 ?Difficult doing work/chores Extremely dIfficult Extremely dIfficult Somewhat difficult  ?  ?-Last DEXA Scan: 03/10/18  ? T-Score femoral neck: -1.4 ? T-Score total hip: -0.5 ? T-Score lumbar spine: -0.3 ? ?Social History  ? ?Tobacco Use  ?Smoking Status Never  ?Smokeless Tobacco Never  ? ?BP Readings from Last 3 Encounters:  ?06/29/21 92/64  ?06/21/21 127/76  ?06/20/21 106/71  ? ?Pulse Readings from Last 3 Encounters:  ?06/29/21 (!) 52  ?06/21/21 68  ?06/20/21 67  ? ?Wt Readings from Last 3 Encounters:  ?06/29/21 179 lb (81.2 kg)  ?06/21/21 182 lb (82.6 kg)  ?06/20/21 183 lb 9.6 oz (83.3 kg)  ? ?BMI Readings from Last 3 Encounters:  ?06/29/21 31.71 kg/m?  ?06/21/21 32.24 kg/m?  ?06/20/21 32.52 kg/m?  ? ? ?Assessment/Interventions: Review of patient past medical history, allergies, medications, health status, including review of consultants reports, laboratory and other test data, was performed as part of comprehensive evaluation and provision of chronic care management services.  ? ?SDOH:  (Social Determinants of Health) assessments and interventions performed: Yes ? ? ?SDOH Screenings  ? ?Alcohol Screen: Low Risk   ? Last Alcohol Screening Score (AUDIT): 0  ?Depression (PHQ2-9): Medium Risk  ? PHQ-2  Score: 8  ?Financial Resource Strain: Low Risk   ? Difficulty of Paying Living Expenses: Not hard at all  ?Food Insecurity: No Food Insecurity  ? Worried About  Running Out of Food in the Last Year: Never true  ? Ran Out of Food in the Last Year: Never true  ?Housing: Low Risk   ? Last Housing Risk Score: 0  ?Physical Activity: Inactive  ? Days of Exercise per Week: 0 days  ? Minutes of Exercise per Session: 0 min  ?Social Connections: Moderately Isolated  ? Frequency of Communication with Friends and Family: More than three times a week  ? Frequency of Social Gatherings with Friends and Family: Twice a week  ? Attends Religious Services: 1 to 4 times per year  ? Active Member of Clubs or Organizations: No  ? Attends Archivist Meetings: Never  ? Marital Status: Widowed  ?Stress: No Stress Concern Present  ? Feeling of Stress : Only a little  ?Tobacco Use: Low Risk   ? Smoking Tobacco Use: Never  ? Smokeless Tobacco Use: Never  ? Passive Exposure: Not on file  ?Transportation Needs: No Transportation Needs  ? Lack of Transportation (Medical): No  ? Lack of Transportation (Non-Medical): No  ? ? ?CCM Care Plan ? ?Allergies  ?Allergen Reactions  ? Sulfa Antibiotics   ? ? ?Medications Reviewed Today   ? ? Reviewed by Merwyn Katos, RN (Registered Nurse) on 06/29/21 at [id number]  Med List Status: <None>  ? ?Medication Order Taking? Sig Documenting Provider Last Dose Status Informant  ?acetaminophen (TYLENOL) 500 MG tablet [id number]  Take 500 mg by mouth every 6 (six) hours as needed for mild pain or headache. [provider]  Active   ?allopurinol (ZYLOPRIM) 100 MG tablet [id number] Yes TAKE 1 TABLET BY MOUTH EVERY DAY [name], [name] 06/28/2021 Active   ?atorvastatin (LIPITOR) 40 MG tablet [id number] Yes TAKE 1 TABLET BY MOUTH EVERY DAY Virginia Crews, MD 06/28/2021 Active   ?B Complex Vitamins (GNP VITAMIN B COMPLEX PO) [id number] Yes Take by mouth daily.  [provider] 06/28/2021 Active    ?Blood Glucose Monitoring Suppl [facility] VERIO) w/Device KIT [id number]  To check blood sugar once daily Mar Daring, [place]  Active   ?buPROPion (WELLBUTRIN XL) 150 MG 24 hr tablet [id number] Yes Ta

## 2021-07-05 NOTE — Patient Instructions (Signed)
Visit Information ?It was great speaking with you today!  Please let me know if you have any questions about our visit. ? ? Goals Addressed   ? ?  ?  ?  ?  ? This Visit's Progress  ?  Monitor and Manage My Blood Sugar-Diabetes Type 2   On track  ?  Timeframe:  Long-Range Goal ?Priority:  High ?Start Date: 06/01/2021                            ?Expected End Date: 06/02/2022                     ? ?Follow Up within 30 days ?  ?- check blood sugar at prescribed times ?- check blood sugar if I feel it is too high or too low ?- enter blood sugar readings and medication or insulin into daily log ?- take the blood sugar log to all doctor visits  ?  ?Why is this important?   ?Checking your blood sugar at home helps to keep it from getting very high or very low.  ?Writing the results in a diary or log helps the doctor know how to care for you.  ?Your blood sugar log should have the time, date and the results.  ?Also, write down the amount of insulin or other medicine that you take.  ?Other information, like what you ate, exercise done and how you were feeling, will also be helpful.   ?  ?Notes:  ?  ? ?  ? ? ?Patient Care Plan: General Pharmacy (Adult)  ?  ? ?Problem Identified: Hypertension, Hyperlipidemia, Diabetes, GERD, Chronic Kidney Disease, Hypothyroidism, Depression, Gout, and Insomnia   ?Priority: High  ?  ? ?Long-Range Goal: Patient-Specific Goal   ?Start Date: 06/01/2021  ?Expected End Date: 06/02/2022  ?This Visit's Progress: On track  ?Recent Progress: On track  ?Priority: High  ?Note:   ?Current Barriers:  ?No barriers noted ? ?Pharmacist Clinical Goal(s):  ?Patient will maintain control of diabetes as evidenced by A1c less than 8%  through collaboration with PharmD and provider.  ? ?Interventions: ?1:1 collaboration with [name], [name] regarding development and update of comprehensive plan of care as evidenced by provider attestation and co-signature ?Inter-disciplinary care team collaboration (see longitudinal  plan of care) ?Comprehensive medication review performed; medication list updated in electronic medical record ? ?Hypertension (BP goal <140/90) ?-Controlled ?-Current treatment: ?Furosemide 40 mg daily: Appropriate, Effective, Safe, Accessible ?-Medications previously tried: Lisinopril  ?-Current home readings: Typically 120s/60 ?-Denies hypotensive/hypertensive symptoms ?-Recommended to continue current medication ? ?Hyperlipidemia: (LDL goal < 70) ?-Not ideally controlled ?-Current treatment: ?Atorvastatin 40 mg daily: Appropriate, Effective, Safe, Accessible  ?Krill Oil 1000 mg daily  ?-Medications previously tried: Administrator ([place])  ?-Tolerating krill oil well.   ?-Recommend rechecking fasting lipid panel ?-Continue current medications ? ?Diabetes (A1c goal <8%) ?-Controlled ?-Current medications: ?Glipizide XL 10 mg daily: Appropriate, Effective, Safe, Accessible  ?-Medications previously tried: Januvia, Metformin  ?-Patient does not qualify for CGM at this time.  ?-Current home glucose readings ?fasting glucose: 98, 80, 90s-100. ?150, 160, 174. This morning 108.  ?-Denies hypoglycemic/hyperglycemic symptoms ?-Recommended to continue current medication ? ?Depression/Anxiety (Goal: Achieve symptom) ?-Not ideally controlled ?-Current treatment: ?Bupropion XL 150 mg daily: Appropriate, Effective, Safe, Accessible  ?Escitalopram 20 mg daily: Appropriate, Effective, Safe, Accessible  ?Trazodone 50 mg nightly: Appropriate, Effective, Safe, Accessible  ?-Medications previously tried/failed: NA ?-Low energy, motivation since  her husband past in 2021.  ?-Patient unsure if she has benefited from bupropion.  ?-Sometimes did not sleep until 4-6 am. Trazodone helped significantly  ?-Recommended to continue current medication ? ?GERD (Goal: Minimize heartburn) ?-Controlled ?-Current treatment  ?Famotidine 40 mg daily  ?-Medications previously tried: NA  ?-Recommended to continue current medication ? ?Gout (Goal: Prevent  gout flares) ?-Controlled ?-Last Gout Flare: <2-3 years ago  ?-Current treatment  ?Allopurinol 100 mg daily ?-Medications previously tried: NA  ?-We discussed:  Counseled patient on low purine diet plan. Counseled patient to reduce consumption of high-fructose corn syrup, sweetened soft drinks, fruit juices, meat, and seafood. ?-Recommended to continue current medication ? ?Osteopenia (Goal Prevent fractures) ?-Controlled ?-Patient is not a candidate for pharmacologic treatment ?-Current treatment  ?Ergocalciferol 50,000 units weekly: Appropriate, Effective, Safe, Accessible ?-Medications previously tried: NA  ?-Recommended to continue current medication ? ?Chronic Kidney Disease Stage 4  ?-Previously was following Dr. Candiss Norse, will follow-up back in March.  ?-All medications assessed for renal dosing and appropriateness in chronic kidney disease. ?-Recommended to continue current medication ? ?Patient Goals/Self-Care Activities ?Patient will:  ?- check glucose daily before breakfast, document, and provide at future appointments ? ?Follow Up Plan: Telephone follow up appointment with care management team member scheduled for:  10/10/2021 at 3:45 PM ?  ? ?Patient agreed to services and verbal consent obtained.  ? ?Patient verbalizes understanding of instructions and care plan provided today and agrees to view in [place]. Active MyChart status confirmed with patient.   ? ?Junius Argyle, PharmD, BCACP, CPP  ?Clinical Pharmacist Practitioner  ?[place] ?[phone] ?  ?

## 2021-07-06 LAB — SURGICAL PATHOLOGY

## 2021-07-10 ENCOUNTER — Other Ambulatory Visit: Payer: Self-pay | Admitting: Family Medicine

## 2021-07-10 ENCOUNTER — Encounter (HOSPITAL_COMMUNITY): Payer: Self-pay | Admitting: Oncology

## 2021-07-10 DIAGNOSIS — E1122 Type 2 diabetes mellitus with diabetic chronic kidney disease: Secondary | ICD-10-CM

## 2021-07-13 ENCOUNTER — Inpatient Hospital Stay: Payer: Medicare Other | Attending: Oncology | Admitting: Oncology

## 2021-07-13 ENCOUNTER — Encounter: Payer: Self-pay | Admitting: Oncology

## 2021-07-13 VITALS — BP 129/84 | HR 56 | Temp 96.1°F | Wt 183.0 lb

## 2021-07-13 DIAGNOSIS — Z90710 Acquired absence of both cervix and uterus: Secondary | ICD-10-CM | POA: Diagnosis not present

## 2021-07-13 DIAGNOSIS — E1122 Type 2 diabetes mellitus with diabetic chronic kidney disease: Secondary | ICD-10-CM | POA: Insufficient documentation

## 2021-07-13 DIAGNOSIS — D7589 Other specified diseases of blood and blood-forming organs: Secondary | ICD-10-CM

## 2021-07-13 DIAGNOSIS — D72820 Lymphocytosis (symptomatic): Secondary | ICD-10-CM | POA: Diagnosis not present

## 2021-07-13 DIAGNOSIS — N1832 Chronic kidney disease, stage 3b: Secondary | ICD-10-CM | POA: Insufficient documentation

## 2021-07-13 DIAGNOSIS — N184 Chronic kidney disease, stage 4 (severe): Secondary | ICD-10-CM | POA: Insufficient documentation

## 2021-07-13 DIAGNOSIS — I129 Hypertensive chronic kidney disease with stage 1 through stage 4 chronic kidney disease, or unspecified chronic kidney disease: Secondary | ICD-10-CM | POA: Insufficient documentation

## 2021-07-13 DIAGNOSIS — C9000 Multiple myeloma not having achieved remission: Secondary | ICD-10-CM | POA: Insufficient documentation

## 2021-07-13 DIAGNOSIS — M899 Disorder of bone, unspecified: Secondary | ICD-10-CM | POA: Insufficient documentation

## 2021-07-13 DIAGNOSIS — D472 Monoclonal gammopathy: Secondary | ICD-10-CM | POA: Insufficient documentation

## 2021-07-13 DIAGNOSIS — Z7189 Other specified counseling: Secondary | ICD-10-CM | POA: Diagnosis not present

## 2021-07-13 DIAGNOSIS — Z809 Family history of malignant neoplasm, unspecified: Secondary | ICD-10-CM | POA: Insufficient documentation

## 2021-07-13 HISTORY — DX: Chronic kidney disease, stage 3b: N18.32

## 2021-07-13 MED ORDER — DIAZEPAM 2 MG PO TABS: 2.0000 mg | ORAL_TABLET | ORAL | 0 refills | Status: DC

## 2021-07-13 NOTE — Progress Notes (Signed)
?Hematology/Oncology Progress note ?[phone] [phone] ?  ? ? ? ?Patient Care Team: ?[name], [name] as PCP - General (Family Medicine) ?Lorelee Cover., MD as Consulting Physician (Ophthalmology) ?Lovell Sheehan, MD as Consulting Physician (Orthopedic Surgery) ?Murlean Iba, MD (Nephrology) ?Earlie Server, MD as Consulting Physician (Oncology) ?Germaine Pomfret, [facility] (Pharmacist) ? ?REFERRING PROVIDER: ?[name], [name]  ?CHIEF COMPLAINTS/REASON FOR VISIT:  ?MGUS and monoclonal lymphocytosis.  ? ?HISTORY OF PRESENTING ILLNESS:  ?Donna Malone is a  76 y.o.  female with PMH listed below who was referred to me for evaluation of leukocytosis ?Reviewed patient' recent labs obtained by PCP.  ? ?10/22/2019 CBC showed elevated white count of 11.3, predominantly lymphocytosis.  Normal hemoglobin and platelet count. ?Previous lab records reviewed. Leukocytosis onset of chronic, duration is since January 2021.   ?No aggravating or elevated factors. ?Associated symptoms or signs:  ?Denies weight loss, fever, chills,night sweats.  Endorses fatigue ?Smoking history: Never smoker ?History of recent oral steroid use or steroid injection: No recent steroid injections. ?History of recent infection: Denies ?Autoimmune disease history.  Denies ? ?Patient denies any chronic wound, prosthesis.  She has chronic knee arthritis. ? ?#  flow cytometry showed CD5+ , CD23 + B-cell population.  CLL/SLL phenotype.  CD38 negative, <1% leukocytes,<5000 cells/ul ?INTERVAL HISTORY ?Donna Malone is a 76 y.o. female who has above history reviewed by me today presents for follow up visit for MGUS and monoclonal lymphocytosis.  ?patient has bone marrow biopsy Malone and presents to discuss results.  No new complaints. ? ?Review of Systems  ?Constitutional:  Positive for fatigue. Negative for appetite change, chills and fever.  ?HENT:   Negative for hearing loss and voice change.   ?Eyes:  Negative for eye  problems.  ?Respiratory:  Negative for chest tightness and cough.   ?Cardiovascular:  Negative for chest pain.  ?Gastrointestinal:  Negative for abdominal distention, abdominal pain and blood in stool.  ?Endocrine: Negative for hot flashes.  ?Genitourinary:  Negative for difficulty urinating and frequency.   ?Musculoskeletal:  Positive for arthralgias.  ?Skin:  Negative for itching and rash.  ?Neurological:  Negative for extremity weakness.  ?Hematological:  Negative for adenopathy.  ?Psychiatric/Behavioral:  Negative for confusion.   ? ? ?MEDICAL HISTORY:  ?Past Medical History:  ?Diagnosis Date  ? Asthma   ? Bone spur   ? heel  ? Depression   ? Diabetes mellitus without complication ([place])   ? Gout   ? Hypertension   ? ? ?SURGICAL HISTORY: ?Past Surgical History:  ?Procedure Laterality Date  ? ABDOMINAL HYSTERECTOMY  04/1970  ? partial  ? CHOLECYSTECTOMY  late 1990's  ? ? ?SOCIAL HISTORY: ?Social History  ? ?Socioeconomic History  ? Marital status: Widowed  ?  Spouse name: [name]  ? Number of children: 3  ? Years of education: Not on file  ? Highest education level: 12th grade  ?Occupational History  ? Occupation: retired  ?Tobacco Use  ? Smoking status: Never  ? Smokeless tobacco: Never  ?Vaping Use  ? Vaping Use: Never used  ?Substance and Sexual Activity  ? Alcohol use: No  ? Drug use: No  ? Sexual activity: Not on file  ?Other Topics Concern  ? Not on file  ?Social History Narrative  ? Not on file  ? ?Social Determinants of Health  ? ?Financial Resource Strain: Low Risk   ? Difficulty of Paying Living Expenses: Not hard at all  ?Food Insecurity: No Food Insecurity  ?  Worried About Charity fundraiser in the Last Year: Never true  ? Ran Out of Food in the Last Year: Never true  ?Transportation Needs: No Transportation Needs  ? Lack of Transportation (Medical): No  ? Lack of Transportation (Non-Medical): No  ?Physical Activity: Inactive  ? Days of Exercise per Week: 0 days  ? Minutes of Exercise per Session: 0  min  ?Stress: No Stress Concern Present  ? Feeling of Stress : Only a little  ?Social Connections: Moderately Isolated  ? Frequency of Communication with Friends and Family: More than three times a week  ? Frequency of Social Gatherings with Friends and Family: Twice a week  ? Attends Religious Services: 1 to 4 times per year  ? Active Member of Clubs or Organizations: No  ? Attends Archivist Meetings: Never  ? Marital Status: Widowed  ?Intimate Partner Violence: Not At Risk  ? Fear of Current or Ex-Partner: No  ? Emotionally Abused: No  ? Physically Abused: No  ? Sexually Abused: No  ? ? ?FAMILY HISTORY: ?Family History  ?Problem Relation Age of Onset  ? CAD Mother   ? Heart attack Mother   ? Heart disease Father   ? Diabetes Cousin   ? Cancer Cousin   ? ? ?ALLERGIES:  is allergic to sulfa antibiotics. ? ?MEDICATIONS:  ?Current Outpatient Medications  ?Medication Sig Dispense Refill  ? acetaminophen (TYLENOL) 500 MG tablet Take 500 mg by mouth every 6 (six) hours as needed for mild pain or headache.    ? allopurinol (ZYLOPRIM) 100 MG tablet TAKE 1 TABLET BY MOUTH EVERY DAY 90 tablet 1  ? atorvastatin (LIPITOR) 40 MG tablet TAKE 1 TABLET BY MOUTH EVERY DAY 90 tablet 2  ? B Complex Vitamins (GNP VITAMIN B COMPLEX PO) Take by mouth daily.     ? Blood Glucose Monitoring Suppl (ONETOUCH VERIO) w/Device KIT To check blood sugar once daily 1 kit 0  ? buPROPion (WELLBUTRIN XL) 150 MG 24 hr tablet Take 1 tablet (150 mg total) by mouth daily. 90 tablet 1  ? escitalopram (LEXAPRO) 20 MG tablet TAKE 1 TABLET BY MOUTH EVERY DAY 90 tablet 0  ? famotidine (PEPCID) 40 MG tablet TAKE 1 TABLET BY MOUTH EVERY DAY 90 tablet 3  ? ferrous sulfate 325 (65 FE) MG EC tablet Take 325 mg by mouth daily.    ? furosemide (LASIX) 40 MG tablet Take 1 tablet (40 mg total) by mouth daily. 90 tablet 3  ? glipiZIDE (GLUCOTROL XL) 10 MG 24 hr tablet TAKE 1 TABLET (10 MG TOTAL) BY MOUTH DAILY WITH BREAKFAST. 60 tablet 0  ? KRILL OIL PO Take  by mouth.    ? Lancets (ONETOUCH ULTRASOFT) lancets To check blood sugar once daily 100 each 12  ? levothyroxine (SYNTHROID) 75 MCG tablet TAKE 1 TABLET BY MOUTH EVERY DAY 90 tablet 3  ? MAGNESIUM-OXIDE PO Take by mouth.    ? Multiple Vitamins-Minerals (PRESERVISION AREDS 2) CAPS Take 1 capsule by mouth in the morning and at bedtime.    ? ONETOUCH VERIO test strip TO CHECK BLOOD SUGAR ONCE DAILY 50 strip 25  ? Polyethyl Glycol-Propyl Glycol (SYSTANE HYDRATION PF OP) Place 1 drop into both eyes daily as needed (for dry eyes).    ? traZODone (DESYREL) 50 MG tablet Take 1 tablet (50 mg total) by mouth at bedtime. 90 tablet 1  ? Vitamin D, Ergocalciferol, (DRISDOL) 1.25 MG (50000 UNIT) CAPS capsule TAKE 1 CAPSULE (50,000 UNITS TOTAL)  BY MOUTH EVERY 7 (SEVEN) DAYS 12 capsule 3  ? ?No current facility-administered medications for this visit.  ? ? ? ?PHYSICAL EXAMINATION: ?ECOG PERFORMANCE STATUS: 1 - Symptomatic but completely ambulatory ?Vitals:  ? 07/13/21 1355  ?BP: 129/84  ?Pulse: (!) 56  ?Temp: (!) 96.1 ?F (35.6 ?C)  ? ?Filed Weights  ? 07/13/21 1355  ?Weight: 183 lb (83 kg)  ? ? ?Physical Exam ?Constitutional:   ?   General: She is not in acute distress. ?   Comments: Patient walks independently  ?HENT:  ?   Head: Normocephalic and atraumatic.  ?Eyes:  ?   General: No scleral icterus. ?Cardiovascular:  ?   Rate and Rhythm: Normal rate and regular rhythm.  ?   Heart sounds: Normal heart sounds.  ?Pulmonary:  ?   Effort: Pulmonary effort is normal. No respiratory distress.  ?   Breath sounds: No wheezing.  ?Abdominal:  ?   General: Bowel sounds are normal. There is no distension.  ?   Palpations: Abdomen is soft.  ?Musculoskeletal:     ?   General: No deformity. Normal range of motion.  ?   Cervical back: Normal range of motion and neck supple.  ?Skin: ?   General: Skin is warm and dry.  ?   Findings: No erythema or rash.  ?Neurological:  ?   Mental Status: She is alert and oriented to person, place, and time. Mental  status is at baseline.  ?   Cranial Nerves: No cranial nerve deficit.  ?   Coordination: Coordination normal.  ?Psychiatric:     ?   Mood and Affect: Mood normal.  ? ? ? ?  Latest Ref Rng & Units 05/04/2021

## 2021-07-24 ENCOUNTER — Other Ambulatory Visit: Payer: Self-pay | Admitting: Physician Assistant

## 2021-07-24 ENCOUNTER — Other Ambulatory Visit: Payer: Self-pay | Admitting: Family Medicine

## 2021-07-24 ENCOUNTER — Encounter (HOSPITAL_COMMUNITY): Payer: Self-pay | Admitting: Oncology

## 2021-07-24 DIAGNOSIS — E7800 Pure hypercholesterolemia, unspecified: Secondary | ICD-10-CM

## 2021-07-24 DIAGNOSIS — E1122 Type 2 diabetes mellitus with diabetic chronic kidney disease: Secondary | ICD-10-CM

## 2021-07-24 DIAGNOSIS — F3342 Major depressive disorder, recurrent, in full remission: Secondary | ICD-10-CM

## 2021-07-24 DIAGNOSIS — R609 Edema, unspecified: Secondary | ICD-10-CM

## 2021-07-24 LAB — SURGICAL PATHOLOGY

## 2021-07-26 ENCOUNTER — Ambulatory Visit (HOSPITAL_COMMUNITY)
Admission: RE | Admit: 2021-07-26 | Discharge: 2021-07-26 | Disposition: A | Discharge status: Home / Self Care | Payer: Medicare Other | Source: Ambulatory Visit | Attending: Oncology | Admitting: Oncology

## 2021-07-26 DIAGNOSIS — C9000 Multiple myeloma not having achieved remission: Secondary | ICD-10-CM | POA: Diagnosis not present

## 2021-07-26 LAB — GLUCOSE, CAPILLARY: Glucose-Capillary: 121 mg/dL — ABNORMAL HIGH (ref 70–99)

## 2021-07-26 MED ORDER — FLUDEOXYGLUCOSE F - 18 (FDG) INJECTION
8.0000 | Freq: Once | INTRAVENOUS | Status: AC
  Administered 2021-07-26: 8.77 via INTRAVENOUS

## 2021-07-28 ENCOUNTER — Inpatient Hospital Stay: Payer: Medicare Other

## 2021-07-28 ENCOUNTER — Encounter: Payer: Self-pay | Admitting: Oncology

## 2021-07-28 ENCOUNTER — Inpatient Hospital Stay: Payer: Medicare Other | Admitting: Oncology

## 2021-07-28 VITALS — BP 135/92 | Temp 96.8°F | Ht 63.0 in | Wt 182.0 lb

## 2021-07-28 DIAGNOSIS — N184 Chronic kidney disease, stage 4 (severe): Secondary | ICD-10-CM | POA: Insufficient documentation

## 2021-07-28 DIAGNOSIS — C9000 Multiple myeloma not having achieved remission: Secondary | ICD-10-CM

## 2021-07-28 DIAGNOSIS — Z7189 Other specified counseling: Secondary | ICD-10-CM

## 2021-07-28 DIAGNOSIS — D72820 Lymphocytosis (symptomatic): Secondary | ICD-10-CM | POA: Diagnosis not present

## 2021-07-28 LAB — LACTATE DEHYDROGENASE: LDH: 90 U/L — ABNORMAL LOW (ref 98–192)

## 2021-07-28 NOTE — Progress Notes (Signed)
?Hematology/Oncology Progress note ?[phone] [phone] ?  ? ? ? ?Patient Care Team: ?[name], [name] as PCP - General (Family Medicine) ?Lorelee Cover., MD as Consulting Physician (Ophthalmology) ?Lovell Sheehan, MD as Consulting Physician (Orthopedic Surgery) ?Murlean Iba, MD (Nephrology) ?Earlie Server, MD as Consulting Physician (Oncology) ?Germaine Pomfret, [facility] (Pharmacist) ? ?REFERRING PROVIDER: ?[name], [name]  ?CHIEF COMPLAINTS/REASON FOR VISIT:  ?Multiple myeloma, monoclonal lymphocytosis.  ? ?HISTORY OF PRESENTING ILLNESS:  ?Donna Malone is a  76 y.o.  female with PMH listed below who was referred to me for evaluation of leukocytosis ?Reviewed patient' recent labs obtained by PCP.  ? ?10/22/2019 CBC showed elevated white count of 11.3, predominantly lymphocytosis.  Normal hemoglobin and platelet count. ?Previous lab records reviewed. Leukocytosis onset of chronic, duration is since January 2021.   ?No aggravating or elevated factors. ?Associated symptoms or signs:  ?Denies weight loss, fever, chills,night sweats.  Endorses fatigue ?Smoking history: Never smoker ?History of recent oral steroid use or steroid injection: No recent steroid injections. ?History of recent infection: Denies ?Autoimmune disease history.  Denies ? ?Patient denies any chronic wound, prosthesis.  She has chronic knee arthritis. ? ?#  flow cytometry showed CD5+ , CD23 + B-cell population.  CLL/SLL phenotype.  CD38 negative, <1% leukocytes,<5000 cells/ul ?INTERVAL HISTORY ?Donna Malone is a 76 y.o. female who has above history reviewed by me today presents for follow up to review bone marrow biopsy results, PET scan results and management plan of multiple myeloma. ? ?Patient was accompanied by her daughter Donna Malone.  Patient has no new complaints.  Chronic fatigue unchanged. ? ?Review of Systems  ?Constitutional:  Positive for fatigue. Negative for appetite change, chills and fever.  ?HENT:    Negative for hearing loss and voice change.   ?Eyes:  Negative for eye problems.  ?Respiratory:  Negative for chest tightness and cough.   ?Cardiovascular:  Negative for chest pain.  ?Gastrointestinal:  Negative for abdominal distention, abdominal pain and blood in stool.  ?Endocrine: Negative for hot flashes.  ?Genitourinary:  Negative for difficulty urinating and frequency.   ?Musculoskeletal:  Positive for arthralgias.  ?Skin:  Negative for itching and rash.  ?Neurological:  Negative for extremity weakness.  ?Hematological:  Negative for adenopathy.  ?Psychiatric/Behavioral:  Negative for confusion.   ? ? ?MEDICAL HISTORY:  ?Past Medical History:  ?Diagnosis Date  ? Asthma   ? Bone spur   ? heel  ? Depression   ? Diabetes mellitus without complication ([place])   ? Gout   ? Hypertension   ? ? ?SURGICAL HISTORY: ?Past Surgical History:  ?Procedure Laterality Date  ? ABDOMINAL HYSTERECTOMY  04/1970  ? partial  ? CHOLECYSTECTOMY  late 1990's  ? ? ?SOCIAL HISTORY: ?Social History  ? ?Socioeconomic History  ? Marital status: Widowed  ?  Spouse name: [name]  ? Number of children: 3  ? Years of education: Not on file  ? Highest education level: 12th grade  ?Occupational History  ? Occupation: retired  ?Tobacco Use  ? Smoking status: Never  ? Smokeless tobacco: Never  ?Vaping Use  ? Vaping Use: Never used  ?Substance and Sexual Activity  ? Alcohol use: No  ? Drug use: No  ? Sexual activity: Not on file  ?Other Topics Concern  ? Not on file  ?Social History Narrative  ? Not on file  ? ?Social Determinants of Health  ? ?Financial Resource Strain: Low Risk   ? Difficulty of Paying Living  Expenses: Not hard at all  ?Food Insecurity: No Food Insecurity  ? Worried About Charity fundraiser in the Last Year: Never true  ? Ran Out of Food in the Last Year: Never true  ?Transportation Needs: No Transportation Needs  ? Lack of Transportation (Medical): No  ? Lack of Transportation (Non-Medical): No  ?Physical Activity: Inactive  ?  Days of Exercise per Week: 0 days  ? Minutes of Exercise per Session: 0 min  ?Stress: No Stress Concern Present  ? Feeling of Stress : Only a little  ?Social Connections: Moderately Isolated  ? Frequency of Communication with Friends and Family: More than three times a week  ? Frequency of Social Gatherings with Friends and Family: Twice a week  ? Attends Religious Services: 1 to 4 times per year  ? Active Member of Clubs or Organizations: No  ? Attends Archivist Meetings: Never  ? Marital Status: Widowed  ?Intimate Partner Violence: Not At Risk  ? Fear of Current or Ex-Partner: No  ? Emotionally Abused: No  ? Physically Abused: No  ? Sexually Abused: No  ? ? ?FAMILY HISTORY: ?Family History  ?Problem Relation Age of Onset  ? CAD Mother   ? Heart attack Mother   ? Heart disease Father   ? Diabetes Cousin   ? Cancer Cousin   ? ? ?ALLERGIES:  is allergic to sulfa antibiotics. ? ?MEDICATIONS:  ?Current Outpatient Medications  ?Medication Sig Dispense Refill  ? acetaminophen (TYLENOL) 500 MG tablet Take 500 mg by mouth every 6 (six) hours as needed for mild pain or headache.    ? allopurinol (ZYLOPRIM) 100 MG tablet TAKE 1 TABLET BY MOUTH EVERY DAY 90 tablet 1  ? atorvastatin (LIPITOR) 40 MG tablet TAKE 1 TABLET BY MOUTH EVERY DAY 90 tablet 0  ? B Complex Vitamins (GNP VITAMIN B COMPLEX PO) Take by mouth daily.     ? Blood Glucose Monitoring Suppl (ONETOUCH VERIO) w/Device KIT To check blood sugar once daily 1 kit 0  ? buPROPion (WELLBUTRIN XL) 150 MG 24 hr tablet Take 1 tablet (150 mg total) by mouth daily. 90 tablet 1  ? diazepam (VALIUM) 2 MG tablet Take 1 tablet (2 mg total) by mouth See admin instructions. Take 1 tablet 15 minutes prior to PET scan; if needed due to incomplete response and/or duration of procedure, may repeat another tablet after 15 minutes 2 tablet 0  ? escitalopram (LEXAPRO) 20 MG tablet TAKE 1 TABLET BY MOUTH EVERY DAY 90 tablet 0  ? famotidine (PEPCID) 40 MG tablet TAKE 1 TABLET BY  MOUTH EVERY DAY 90 tablet 3  ? ferrous sulfate 325 (65 FE) MG EC tablet Take 325 mg by mouth daily.    ? furosemide (LASIX) 40 MG tablet TAKE 1 TABLET BY MOUTH EVERY DAY 90 tablet 3  ? glipiZIDE (GLUCOTROL XL) 10 MG 24 hr tablet TAKE 1 TABLET (10 MG TOTAL) BY MOUTH DAILY WITH BREAKFAST. 90 tablet 0  ? KRILL OIL PO Take by mouth.    ? Lancets (ONETOUCH ULTRASOFT) lancets To check blood sugar once daily 100 each 12  ? levothyroxine (SYNTHROID) 75 MCG tablet TAKE 1 TABLET BY MOUTH EVERY DAY 90 tablet 3  ? MAGNESIUM-OXIDE PO Take by mouth.    ? Multiple Vitamins-Minerals (PRESERVISION AREDS 2) CAPS Take 1 capsule by mouth in the morning and at bedtime.    ? ONETOUCH VERIO test strip TO CHECK BLOOD SUGAR ONCE DAILY 50 strip 25  ? Polyethyl  Glycol-Propyl Glycol (SYSTANE HYDRATION PF OP) Place 1 drop into both eyes daily as needed (for dry eyes).    ? traZODone (DESYREL) 50 MG tablet Take 1 tablet (50 mg total) by mouth at bedtime. 90 tablet 1  ? Vitamin D, Ergocalciferol, (DRISDOL) 1.25 MG (50000 UNIT) CAPS capsule TAKE 1 CAPSULE (50,000 UNITS TOTAL) BY MOUTH EVERY 7 (SEVEN) DAYS 12 capsule 3  ? ?No current facility-administered medications for this visit.  ? ? ? ?PHYSICAL EXAMINATION: ?ECOG PERFORMANCE STATUS: 1 - Symptomatic but completely ambulatory ?Vitals:  ? 07/28/21 1030  ?BP: (!) 135/92  ?Temp: (!) 96.8 ?F (36 ?C)  ? ?Filed Weights  ? 07/28/21 1030  ?Weight: 182 lb (82.6 kg)  ? ? ?Physical Exam ?Constitutional:   ?   General: She is not in acute distress. ?   Comments: Patient walks independently  ?HENT:  ?   Head: Normocephalic and atraumatic.  ?Eyes:  ?   General: No scleral icterus. ?Cardiovascular:  ?   Rate and Rhythm: Normal rate and regular rhythm.  ?   Heart sounds: Normal heart sounds.  ?Pulmonary:  ?   Effort: Pulmonary effort is normal. No respiratory distress.  ?   Breath sounds: No wheezing.  ?Abdominal:  ?   General: Bowel sounds are normal. There is no distension.  ?   Palpations: Abdomen is soft.   ?Musculoskeletal:     ?   General: No deformity. Normal range of motion.  ?   Cervical back: Normal range of motion and neck supple.  ?Skin: ?   General: Skin is warm and dry.  ?   Findings: No erythem

## 2021-07-29 LAB — BETA 2 MICROGLOBULIN, SERUM: Beta-2 Microglobulin: 7.4 mg/L — ABNORMAL HIGH (ref 0.6–2.4)

## 2021-07-30 DIAGNOSIS — E785 Hyperlipidemia, unspecified: Secondary | ICD-10-CM

## 2021-07-30 DIAGNOSIS — Z7984 Long term (current) use of oral hypoglycemic drugs: Secondary | ICD-10-CM

## 2021-07-30 DIAGNOSIS — E1122 Type 2 diabetes mellitus with diabetic chronic kidney disease: Secondary | ICD-10-CM | POA: Diagnosis not present

## 2021-07-30 DIAGNOSIS — N184 Chronic kidney disease, stage 4 (severe): Secondary | ICD-10-CM

## 2021-07-30 DIAGNOSIS — F32A Depression, unspecified: Secondary | ICD-10-CM

## 2021-07-30 DIAGNOSIS — E1169 Type 2 diabetes mellitus with other specified complication: Secondary | ICD-10-CM

## 2021-07-30 DIAGNOSIS — I129 Hypertensive chronic kidney disease with stage 1 through stage 4 chronic kidney disease, or unspecified chronic kidney disease: Secondary | ICD-10-CM

## 2021-07-31 ENCOUNTER — Other Ambulatory Visit: Payer: Self-pay | Admitting: Family Medicine

## 2021-07-31 DIAGNOSIS — Z1211 Encounter for screening for malignant neoplasm of colon: Secondary | ICD-10-CM

## 2021-08-02 ENCOUNTER — Ambulatory Visit (INDEPENDENT_AMBULATORY_CARE_PROVIDER_SITE_OTHER): Payer: Medicare Other | Admitting: Family Medicine

## 2021-08-02 ENCOUNTER — Encounter: Payer: Self-pay | Admitting: Family Medicine

## 2021-08-02 VITALS — BP 86/60 | HR 76 | Temp 97.9°F | Resp 16 | Ht 63.0 in | Wt 181.1 lb

## 2021-08-02 DIAGNOSIS — M793 Panniculitis, unspecified: Secondary | ICD-10-CM

## 2021-08-02 DIAGNOSIS — F330 Major depressive disorder, recurrent, mild: Secondary | ICD-10-CM | POA: Diagnosis not present

## 2021-08-02 DIAGNOSIS — I959 Hypotension, unspecified: Secondary | ICD-10-CM

## 2021-08-02 DIAGNOSIS — E1122 Type 2 diabetes mellitus with diabetic chronic kidney disease: Secondary | ICD-10-CM

## 2021-08-02 DIAGNOSIS — N1832 Chronic kidney disease, stage 3b: Secondary | ICD-10-CM

## 2021-08-02 DIAGNOSIS — C9000 Multiple myeloma not having achieved remission: Secondary | ICD-10-CM

## 2021-08-02 DIAGNOSIS — B372 Candidiasis of skin and nail: Secondary | ICD-10-CM

## 2021-08-02 NOTE — Assessment & Plan Note (Signed)
Chronic, stable ?Followed by oncology ?Hypotension noted today; advised use of midodrine if continued ?

## 2021-08-02 NOTE — Assessment & Plan Note (Signed)
Chronic, stable ?Continue wellbutrin '150mg'$  ?Lexapro 20 mg  ?Trazodone 50 mg qHS ? ?

## 2021-08-02 NOTE — Assessment & Plan Note (Signed)
Chronic, worsening ?Abdominal skin fold yeast infection, worsening with temperature/humidity increase ?Has been using OTC antifungal cream at site which helps to some extent ?Referral placed for plastics review for potential skin removal  ?

## 2021-08-02 NOTE — Assessment & Plan Note (Signed)
Acute, stable ?Patient AxO x4; normal gait. ?Pt report no complaints at this time; noted that she has not had food/drink today ?Encouraged to follow up with Bps at home and let us know if they are <100 SBP ?

## 2021-08-02 NOTE — Assessment & Plan Note (Signed)
Chronic, stable ?Repeat A1c ?Currently controlled with glipizide 10 mg  ?Will send for eye records from Dr Gloriann Loan ?

## 2021-08-02 NOTE — Assessment & Plan Note (Signed)
Chronic, stable ?Associated with worsening abdominal skin fold yeast infection, worsening with temperature/humidity increase ?Has been using OTC antifungal cream at site which helps to some extent ?Referral placed for plastics review for potential skin removal  ? ?

## 2021-08-02 NOTE — Progress Notes (Signed)
?  ?I,Tiffany J Bragg,acting as a scribe for [name], [name].,have documented all relevant documentation on the behalf of [name], [name],as directed by  [name], [name] while in the presence of [name], [name].  ? ? ?Established patient visit ? ? ?Patient: Donna Malone   DOB: [birth year]   76 y.o. Female  MRN: [id number] ?Visit Date: 08/02/2021 ? ?Today's healthcare provider: [name], [name]  ?Re Introduced to nurse practitioner role and practice setting.  All questions answered.  Discussed provider/patient relationship and expectations. ? ? ?Chief Complaint  ?Patient presents with  ? Depression  ? Diabetes  ? Hypertension  ? ?Subjective  ?  ?HPI  ?Depression, Follow-up ? ?She  was last seen for this 6 weeks ago. ?Changes made at last visit include none, recommended therapy or counseling. ?  ?She reports fair compliance with treatment. ?She is not having side effects.  ? ?She reports good tolerance of treatment. ?Current symptoms include: fatigue and impaired memory ?She feels she is Improved since last visit. ? ? ?  06/21/2021  ?  1:22 PM 05/04/2021  ?  1:20 PM 04/27/2021  ?  8:24 AM  ?Depression screen PHQ 2/9  ?Decreased Interest 2 3 2   ?Down, Depressed, Hopeless 1 3 2   ?PHQ - 2 Score 3 6 4   ?Altered sleeping 0 3 0  ?Tired, decreased energy 3 3 3   ?Change in appetite 2 3 2   ?Feeling bad or failure about yourself  0 0 0  ?Trouble concentrating 0 3 0  ?Moving slowly or fidgety/restless 0 1 0  ?Suicidal thoughts 0 0 0  ?PHQ-9 Score 8 19 9   ?Difficult doing work/chores Extremely dIfficult Extremely dIfficult Somewhat difficult  ?  ?-----------------------------------------------------------------------------------------  ?Diabetes Mellitus Type II, Follow-up ? ?Lab Results  ?Component Value Date  ? HGBA1C 6.7 (H) 05/04/2021  ? HGBA1C 6.1 (A) 09/27/2020  ? HGBA1C 6.5 (H) 04/27/2020  ? ?Wt Readings from Last 3 Encounters:  ?08/02/21 181 lb 1.6 oz (82.1 kg)  ?07/28/21 182 lb (82.6 kg)  ?07/26/21 176 lb  (79.8 kg)  ? ?Last seen for diabetes 6 weeks ago.  ?Management since then includes Continue 10 mg Glipizide  ?Not on ACE or ARB- recommend restart of 5 mg Lisinopril  ?On 40 mg Lipitor ?UTD on vaccines and foot exam, UTD on urine micro. ?She reports good compliance with treatment. ?She is not having side effects.  ?Symptoms: ?Yes fatigue No foot ulcerations  ?No appetite changes No nausea  ?No paresthesia of the feet  Yes polydipsia  ?No polyuria No visual disturbances   ?No vomiting   ? ? ?Home blood sugar records: fasting range: 120s ? ?Episodes of hypoglycemia? No  ?  ?Current insulin regiment: n/a ?Most Recent Eye Exam: within the last 3 months ?Current exercise: no regular exercise ?Current diet habits: on average, 2 meals per day ? ?Pertinent Labs: ?Lab Results  ?Component Value Date  ? CHOL 204 (H) 05/04/2021  ? HDL 33 (L) 05/04/2021  ? [place] 91 05/04/2021  ? TRIG 487 (H) 05/04/2021  ? CHOLHDL 6.2 (H) 05/04/2021  ? Lab Results  ?Component Value Date  ? NA 136 05/04/2021  ? K 3.6 05/04/2021  ? CREATININE 1.82 (H) 05/04/2021  ? EGFR 28 (L) 05/04/2021  ? MICROALBUR 50 04/20/2019  ? LABMICR 26.3 05/04/2021  ?  ? ?---------------------------------------------------------------------------------------------------  ?Hypertension, follow-up ? ?BP Readings from Last 3 Encounters:  ?08/02/21 (!) 86/60  ?07/28/21 (!) 135/92  ?07/13/21 129/84  ?  Wt Readings from Last 3 Encounters:  ?08/02/21 181 lb 1.6 oz (82.1 kg)  ?07/28/21 182 lb (82.6 kg)  ?07/26/21 176 lb (79.8 kg)  ?  ? ?She was last seen for hypertension 6 weeks ago.  ?BP at that visit was 127/76. Management since that visit includes recommend restarting 5 mg of lisinipril. ? ?She is following a Regular diet. ?She is not exercising. ?She does not smoke. ? ?Use of agents associated with hypertension: none.  ? ?Outside blood pressures are being checked; "good", does not remember. ? ?Symptoms: ?No chest pain No chest pressure  ?No palpitations No syncope  ?Yes  dyspnea Yes orthopnea  ?No paroxysmal nocturnal dyspnea No lower extremity edema  ? ?Pertinent labs ?Lab Results  ?Component Value Date  ? CHOL 204 (H) 05/04/2021  ? HDL 33 (L) 05/04/2021  ? [place] 91 05/04/2021  ? TRIG 487 (H) 05/04/2021  ? CHOLHDL 6.2 (H) 05/04/2021  ? Lab Results  ?Component Value Date  ? NA 136 05/04/2021  ? K 3.6 05/04/2021  ? CREATININE 1.82 (H) 05/04/2021  ? EGFR 28 (L) 05/04/2021  ? GLUCOSE 144 (H) 05/04/2021  ? TSH 1.250 05/04/2021  ?  ? ?The ASCVD Risk score (Arnett DK, et al., 2019) failed to calculate for the following reasons: ?  The valid systolic blood pressure range is 90 to 200 mmHg ? ?---------------------------------------------------------------------------------------------------  ? ?Medications: ?Outpatient Medications Prior to Visit  ?Medication Sig  ? acetaminophen (TYLENOL) 500 MG tablet Take 500 mg by mouth every 6 (six) hours as needed for mild pain or headache.  ? allopurinol (ZYLOPRIM) 100 MG tablet TAKE 1 TABLET BY MOUTH EVERY DAY  ? atorvastatin (LIPITOR) 40 MG tablet TAKE 1 TABLET BY MOUTH EVERY DAY  ? B Complex Vitamins (GNP VITAMIN B COMPLEX PO) Take by mouth daily.   ? Blood Glucose Monitoring Suppl (ONETOUCH VERIO) w/Device KIT To check blood sugar once daily  ? buPROPion (WELLBUTRIN XL) 150 MG 24 hr tablet Take 1 tablet (150 mg total) by mouth daily.  ? diazepam (VALIUM) 2 MG tablet Take 1 tablet (2 mg total) by mouth See admin instructions. Take 1 tablet 15 minutes prior to PET scan; if needed due to incomplete response and/or duration of procedure, may repeat another tablet after 15 minutes  ? escitalopram (LEXAPRO) 20 MG tablet TAKE 1 TABLET BY MOUTH EVERY DAY  ? famotidine (PEPCID) 40 MG tablet TAKE 1 TABLET BY MOUTH EVERY DAY  ? ferrous sulfate 325 (65 FE) MG EC tablet Take 325 mg by mouth daily.  ? furosemide (LASIX) 40 MG tablet TAKE 1 TABLET BY MOUTH EVERY DAY  ? glipiZIDE (GLUCOTROL XL) 10 MG 24 hr tablet TAKE 1 TABLET (10 MG TOTAL) BY MOUTH DAILY WITH  BREAKFAST.  ? KRILL OIL PO Take by mouth.  ? Lancets (ONETOUCH ULTRASOFT) lancets To check blood sugar once daily  ? levothyroxine (SYNTHROID) 75 MCG tablet TAKE 1 TABLET BY MOUTH EVERY DAY  ? MAGNESIUM-OXIDE PO Take by mouth.  ? Multiple Vitamins-Minerals (PRESERVISION AREDS 2) CAPS Take 1 capsule by mouth in the morning and at bedtime.  ? ONETOUCH VERIO test strip TO CHECK BLOOD SUGAR ONCE DAILY  ? Polyethyl Glycol-Propyl Glycol (SYSTANE HYDRATION PF OP) Place 1 drop into both eyes daily as needed (for dry eyes).  ? traZODone (DESYREL) 50 MG tablet Take 1 tablet (50 mg total) by mouth at bedtime.  ? Vitamin D, Ergocalciferol, (DRISDOL) 1.25 MG (50000 UNIT) CAPS capsule TAKE 1 CAPSULE (50,000 UNITS TOTAL) BY  MOUTH EVERY 7 (SEVEN) DAYS  ? ?No facility-administered medications prior to visit.  ? ? ?Review of Systems ? ? ?  Objective  ?  ?BP (!) 86/60   Pulse 76   Temp 97.9 ?F (36.6 ?C)   Resp 16   Ht 5' 3"  (1.6 m)   Wt 181 lb 1.6 oz (82.1 kg)   SpO2 98%   BMI 32.08 kg/m?  ? ? ?Physical Exam ?Vitals and nursing note reviewed.  ?Constitutional:   ?   General: She is not in acute distress. ?   Appearance: Normal appearance. She is obese. She is not ill-appearing, toxic-appearing or diaphoretic.  ?HENT:  ?   Head: Normocephalic and atraumatic.  ?Cardiovascular:  ?   Rate and Rhythm: Normal rate and regular rhythm.  ?   Pulses: Normal pulses.  ?   Heart sounds: Normal heart sounds. No murmur heard. ?  No friction rub. No gallop.  ?Pulmonary:  ?   Effort: Pulmonary effort is normal. No respiratory distress.  ?   Breath sounds: Normal breath sounds. No stridor. No wheezing, rhonchi or rales.  ?Chest:  ?   Chest wall: No tenderness.  ?Abdominal:  ?   General: Bowel sounds are normal.  ?   Palpations: Abdomen is soft.  ?Musculoskeletal:     ?   General: No swelling, tenderness, deformity or signs of injury. Normal range of motion.  ?   Right lower leg: No edema.  ?   Left lower leg: No edema.  ?Skin: ?   General: Skin  is warm and dry.  ?   Capillary Refill: Capillary refill takes less than 2 seconds.  ?   Coloration: Skin is not jaundiced or pale.  ?   Findings: No bruising, erythema, lesion or rash.  ?Neurological:  ?   Gener

## 2021-08-03 LAB — HEMOGLOBIN A1C
Est. average glucose Bld gHb Est-mCnc: 128 mg/dL
Hgb A1c MFr Bld: 6.1 % — ABNORMAL HIGH (ref 4.8–5.6)

## 2021-08-21 ENCOUNTER — Ambulatory Visit: Payer: Medicare Other | Admitting: Plastic Surgery

## 2021-08-21 ENCOUNTER — Encounter: Payer: Self-pay | Admitting: Plastic Surgery

## 2021-08-21 ENCOUNTER — Other Ambulatory Visit: Payer: Self-pay | Admitting: Family Medicine

## 2021-08-21 VITALS — BP 130/69 | HR 75 | Ht 63.0 in | Wt 178.0 lb

## 2021-08-21 DIAGNOSIS — F5104 Psychophysiologic insomnia: Secondary | ICD-10-CM

## 2021-08-21 DIAGNOSIS — M793 Panniculitis, unspecified: Secondary | ICD-10-CM | POA: Diagnosis not present

## 2021-08-21 DIAGNOSIS — R21 Rash and other nonspecific skin eruption: Secondary | ICD-10-CM

## 2021-08-21 DIAGNOSIS — Z1331 Encounter for screening for depression: Secondary | ICD-10-CM

## 2021-08-21 DIAGNOSIS — F332 Major depressive disorder, recurrent severe without psychotic features: Secondary | ICD-10-CM

## 2021-08-21 DIAGNOSIS — N1832 Chronic kidney disease, stage 3b: Secondary | ICD-10-CM

## 2021-08-22 NOTE — Progress Notes (Signed)
Referring Provider Donna Sprout, [name] [place],  [place] [phone]   CC:  Abdominal pannus with rashes   Donna Malone is an 76 y.o. female.  HPI: Patient is a 76 year old with a history of abdominal pannus and rashes.  She is interested in having abdominal panniculectomy.  She has used significant different creams.  Her abdominal surgeries include hysterectomy and gallbladder surgery.  We did both laparoscopic.  She does not use tobacco she has history of diabetes but her hemoglobin A1c has been 6.1 in 2023.  She is lost weight from 270 pounds to 278 pounds on purpose.  Patient has a history of multiple myeloma.  She also has a history of renal insufficiency.    Allergies  Allergen Reactions   Sulfa Antibiotics     Outpatient Encounter Medications as of 08/21/2021  Medication Sig   acetaminophen (TYLENOL) 500 MG tablet Take 500 mg by mouth every 6 (six) hours as needed for mild pain or headache.   allopurinol (ZYLOPRIM) 100 MG tablet TAKE 1 TABLET BY MOUTH EVERY DAY   atorvastatin (LIPITOR) 40 MG tablet TAKE 1 TABLET BY MOUTH EVERY DAY   B Complex Vitamins (GNP VITAMIN B COMPLEX PO) Take by mouth daily.    Blood Glucose Monitoring Suppl (ONETOUCH VERIO) w/Device KIT To check blood sugar once daily   escitalopram (LEXAPRO) 20 MG tablet TAKE 1 TABLET BY MOUTH EVERY DAY   famotidine (PEPCID) 40 MG tablet TAKE 1 TABLET BY MOUTH EVERY DAY   ferrous sulfate 325 (65 FE) MG EC tablet Take 325 mg by mouth daily.   furosemide (LASIX) 40 MG tablet TAKE 1 TABLET BY MOUTH EVERY DAY   KRILL OIL PO Take by mouth.   Lancets (ONETOUCH ULTRASOFT) lancets To check blood sugar once daily   levothyroxine (SYNTHROID) 75 MCG tablet TAKE 1 TABLET BY MOUTH EVERY DAY   MAGNESIUM-OXIDE PO Take by mouth.   Multiple Vitamins-Minerals (PRESERVISION AREDS 2) CAPS Take 1 capsule by mouth in the morning and at bedtime.   ONETOUCH VERIO test strip TO CHECK BLOOD SUGAR ONCE DAILY   Polyethyl  Glycol-Propyl Glycol (SYSTANE HYDRATION PF OP) Place 1 drop into both eyes daily as needed (for dry eyes).   Vitamin D, Ergocalciferol, (DRISDOL) 1.25 MG (50000 UNIT) CAPS capsule TAKE 1 CAPSULE (50,000 UNITS TOTAL) BY MOUTH EVERY 7 (SEVEN) DAYS   [DISCONTINUED] buPROPion (WELLBUTRIN XL) 150 MG 24 hr tablet Take 1 tablet (150 mg total) by mouth daily.   [DISCONTINUED] glipiZIDE (GLUCOTROL XL) 10 MG 24 hr tablet TAKE 1 TABLET (10 MG TOTAL) BY MOUTH DAILY WITH BREAKFAST.   [DISCONTINUED] traZODone (DESYREL) 50 MG tablet Take 1 tablet (50 mg total) by mouth at bedtime.   diazepam (VALIUM) 2 MG tablet Take 1 tablet (2 mg total) by mouth See admin instructions. Take 1 tablet 15 minutes prior to PET scan; if needed due to incomplete response and/or duration of procedure, may repeat another tablet after 15 minutes   No facility-administered encounter medications on file as of 08/21/2021.     Past Medical History:  Diagnosis Date   Asthma    Bone spur    heel   Depression    Diabetes mellitus without complication ([place])    Gout    Hypertension     Past Surgical History:  Procedure Laterality Date   ABDOMINAL HYSTERECTOMY  04/1970   partial   CHOLECYSTECTOMY  late 1990's    Family History  Problem Relation Age of Onset  CAD Mother    Heart attack Mother    Heart disease Father    Diabetes Cousin    Cancer Cousin     Social History   Social History Narrative   Not on file     Review of Systems General: Denies fevers, chills, weight loss CV: Denies chest pain, shortness of breath, palpitations   Physical Exam    08/21/2021    3:16 PM 08/02/2021    2:11 PM 08/02/2021    2:06 PM  Vitals with BMI  Height 5' 3"   5' 3"   Weight 178 lbs  181 lbs 2 oz  BMI 38.82  66.66  Systolic 486 86 76  Diastolic 69 60 41  Pulse 75  76    General:  No acute distress,  Alert and oriented, Non-Toxic, Normal speech and affect Abdomen: Significant excess skin above and below  umbilicus.  Assessment/Plan Patient may be a good candidate for abdominal panniculectomy.  Want to verify that her other treating physicians including Dr. Tasia Malone and Dr. Candiss Malone support her decision to proceed with surgery.  Dr. Tasia Malone is treating her multiple myeloma and Dr. Candiss Malone is treating her renal disease.    [name] 08/22/2021, 11:38 AM

## 2021-08-27 LAB — COLOGUARD: COLOGUARD: POSITIVE — AB

## 2021-08-29 ENCOUNTER — Other Ambulatory Visit: Payer: Self-pay | Admitting: Family Medicine

## 2021-08-29 ENCOUNTER — Other Ambulatory Visit: Payer: Self-pay

## 2021-08-29 DIAGNOSIS — R195 Other fecal abnormalities: Secondary | ICD-10-CM

## 2021-08-29 MED ORDER — NA SULFATE-K SULFATE-MG SULF 17.5-3.13-1.6 GM/177ML PO SOLN: 1.0000 | Freq: Once | ORAL | 0 refills | Status: AC

## 2021-08-29 MED ORDER — ONDANSETRON HCL 4 MG PO TABS: 4.0000 mg | ORAL_TABLET | Freq: Three times a day (TID) | ORAL | 1 refills | Status: DC | PRN

## 2021-08-29 NOTE — Progress Notes (Signed)
Gastroenterology Pre-Procedure Review  Request Date: 10/09/2021 Requesting Physician: Dr. Vicente Males  PATIENT REVIEW QUESTIONS: The patient responded to the following health history questions as indicated:    1. Are you having any GI issues? no 2. Do you have a personal history of Polyps? no 3. Do you have a family history of Colon Cancer or Polyps? no 4. Diabetes Mellitus? yes (TYPE 2) 5. Joint replacements in the past 12 months?no 6. 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 Any artificial heart valves, MVP, or defibrillator?no    MEDICATIONS & ALLERGIES:    Patient reports the following regarding taking any anticoagulation/antiplatelet therapy:   Plavix, Coumadin, Eliquis, Xarelto, Lovenox, Pradaxa, Brilinta, or Effient? no Aspirin? no  Patient confirms/reports the following medications:  Current Outpatient Medications  Medication Sig Dispense Refill   acetaminophen (TYLENOL) 500 MG tablet Take 500 mg by mouth every 6 (six) hours as needed for mild pain or headache.     allopurinol (ZYLOPRIM) 100 MG tablet TAKE 1 TABLET BY MOUTH EVERY DAY 90 tablet 1   atorvastatin (LIPITOR) 40 MG tablet TAKE 1 TABLET BY MOUTH EVERY DAY 90 tablet 0   B Complex Vitamins (GNP VITAMIN B COMPLEX PO) Take by mouth daily.      Blood Glucose Monitoring Suppl (ONETOUCH VERIO) w/Device KIT To check blood sugar once daily 1 kit 0   buPROPion (WELLBUTRIN XL) 150 MG 24 hr tablet TAKE 1 TABLET BY MOUTH EVERY DAY 90 tablet 1   diazepam (VALIUM) 2 MG tablet Take 1 tablet (2 mg total) by mouth See admin instructions. Take 1 tablet 15 minutes prior to PET scan; if needed due to incomplete response and/or duration of procedure, may repeat another tablet after 15 minutes 2 tablet 0   escitalopram (LEXAPRO) 20 MG tablet TAKE 1 TABLET BY MOUTH EVERY DAY 90 tablet 0   famotidine (PEPCID) 40 MG tablet TAKE 1 TABLET BY MOUTH EVERY DAY 90 tablet 3   ferrous sulfate 325 (65 FE)  MG EC tablet Take 325 mg by mouth daily.     furosemide (LASIX) 40 MG tablet TAKE 1 TABLET BY MOUTH EVERY DAY 90 tablet 3   glipiZIDE (GLUCOTROL XL) 10 MG 24 hr tablet TAKE 1 TABLET (10 MG TOTAL) BY MOUTH DAILY WITH BREAKFAST. 90 tablet 0   KRILL OIL PO Take by mouth.     Lancets (ONETOUCH ULTRASOFT) lancets To check blood sugar once daily 100 each 12   levothyroxine (SYNTHROID) 75 MCG tablet TAKE 1 TABLET BY MOUTH EVERY DAY 90 tablet 3   MAGNESIUM-OXIDE PO Take by mouth.     Multiple Vitamins-Minerals (PRESERVISION AREDS 2) CAPS Take 1 capsule by mouth in the morning and at bedtime.     ONETOUCH VERIO test strip TO CHECK BLOOD SUGAR ONCE DAILY 50 strip 25   Polyethyl Glycol-Propyl Glycol (SYSTANE HYDRATION PF OP) Place 1 drop into both eyes daily as needed (for dry eyes).     traZODone (DESYREL) 50 MG tablet TAKE 1 TABLET BY MOUTH EVERYDAY AT BEDTIME 90 tablet 1   Vitamin D, Ergocalciferol, (DRISDOL) 1.25 MG (50000 UNIT) CAPS capsule TAKE 1 CAPSULE (50,000 UNITS TOTAL) BY MOUTH EVERY 7 (SEVEN) DAYS 12 capsule 3   No current facility-administered medications for this visit.    Patient confirms/reports the following allergies:  Allergies  Allergen Reactions   Sulfa Antibiotics     No orders of the defined types were placed in this encounter.   AUTHORIZATION  INFORMATION Primary Insurance: 1D#: Group #:  Secondary Insurance: 1D#: Group #:  SCHEDULE INFORMATION: Date: 10/09/2021 Time: Location: [place]

## 2021-09-04 ENCOUNTER — Telehealth: Payer: Self-pay

## 2021-09-04 NOTE — Progress Notes (Signed)
  Chronic Care Management Pharmacy Assistant   Name: Brandy T Kapusta  MRN: [id number] DOB: 08/10/1945  Reason for Encounter:Diabetes Disease State Call.   Recent office visits:  08/02/2021 Elise Payne [name] (PCP) No medication changes noted, Ambulatory referral to Plastic Surgery, Return in about 3 months   Recent consult visits:  08/21/2021 Dr. Luppens MD (Plastic Surgery) No medication Changes noted 07/28/2021  Dr. YU MD (Oncology) No Medication Changes noted, return in 2 months 07/13/2021 Dr. YU MD (Oncology) start Diazepam 2 mg oral prior to PET scan  Hospital visits:  None in previous 6 months  Medications: Outpatient Encounter Medications as of 09/04/2021  Medication Sig   acetaminophen (TYLENOL) 500 MG tablet Take 500 mg by mouth every 6 (six) hours as needed for mild pain or headache.   allopurinol (ZYLOPRIM) 100 MG tablet TAKE 1 TABLET BY MOUTH EVERY DAY   atorvastatin (LIPITOR) 40 MG tablet TAKE 1 TABLET BY MOUTH EVERY DAY   B Complex Vitamins (GNP VITAMIN B COMPLEX PO) Take by mouth daily.    Blood Glucose Monitoring Suppl (ONETOUCH VERIO) w/Device KIT To check blood sugar once daily   buPROPion (WELLBUTRIN XL) 150 MG 24 hr tablet TAKE 1 TABLET BY MOUTH EVERY DAY   diazepam (VALIUM) 2 MG tablet Take 1 tablet (2 mg total) by mouth See admin instructions. Take 1 tablet 15 minutes prior to PET scan; if needed due to incomplete response and/or duration of procedure, may repeat another tablet after 15 minutes   escitalopram (LEXAPRO) 20 MG tablet TAKE 1 TABLET BY MOUTH EVERY DAY   famotidine (PEPCID) 40 MG tablet TAKE 1 TABLET BY MOUTH EVERY DAY   ferrous sulfate 325 (65 FE) MG EC tablet Take 325 mg by mouth daily.   furosemide (LASIX) 40 MG tablet TAKE 1 TABLET BY MOUTH EVERY DAY   glipiZIDE (GLUCOTROL XL) 10 MG 24 hr tablet TAKE 1 TABLET (10 MG TOTAL) BY MOUTH DAILY WITH BREAKFAST.   KRILL OIL PO Take by mouth.   Lancets (ONETOUCH ULTRASOFT) lancets To check blood sugar  once daily   levothyroxine (SYNTHROID) 75 MCG tablet TAKE 1 TABLET BY MOUTH EVERY DAY   MAGNESIUM-OXIDE PO Take by mouth.   Multiple Vitamins-Minerals (PRESERVISION AREDS 2) CAPS Take 1 capsule by mouth in the morning and at bedtime.   ondansetron (ZOFRAN) 4 MG tablet Take 1 tablet (4 mg total) by mouth every 8 (eight) hours as needed for nausea or vomiting.   ONETOUCH VERIO test strip TO CHECK BLOOD SUGAR ONCE DAILY   Polyethyl Glycol-Propyl Glycol (SYSTANE HYDRATION PF OP) Place 1 drop into both eyes daily as needed (for dry eyes).   traZODone (DESYREL) 50 MG tablet TAKE 1 TABLET BY MOUTH EVERYDAY AT BEDTIME   Vitamin D, Ergocalciferol, (DRISDOL) 1.25 MG (50000 UNIT) CAPS capsule TAKE 1 CAPSULE (50,000 UNITS TOTAL) BY MOUTH EVERY 7 (SEVEN) DAYS   No facility-administered encounter medications on file as of 09/04/2021.    Care Gaps: Covid-19 Vaccine Ophthalmology Exam Shingrix Vaccine   Star Rating Drugs: Glipizide 10 mg last filled 07/24/2021 for 60 day supply at [facility]. Atorvastatin 40 mg last filled 06/15/2021 90 day supply at [facility].   Medication Fill Gaps: None ID  Recent Relevant Labs: Lab Results  Component Value Date/Time   HGBA1C 6.1 (H) 08/02/2021 02:35 PM   HGBA1C 6.7 (H) 05/04/2021 02:08 PM   MICROALBUR 50 04/20/2019 10:34 AM   MICROALBUR 20 04/09/2017 04:48 PM    Kidney Function Lab Results  Component   Value Date/Time   CREATININE 1.82 (H) 05/04/2021 02:08 PM   CREATININE 2.05 (H) 03/17/2021 11:45 AM   GFRNONAA 25 (L) 03/17/2021 11:45 AM   GFRAA 42 (L) 04/27/2020 09:59 AM    Current antihyperglycemic regimen:  Glipizide XL 10 mg daily  What recent interventions/DTPs have been made to improve glycemic control:  None ID  Have there been any recent hospitalizations or ED visits since last visit with CPP? No  Patient reports hypoglycemic symptoms, including Shaky  Patient states she will get episodes of being shaky,but this has been happening for  a while.  Patient reports hyperglycemic symptoms, including fatigue and weakness  Patient reports she will feel weak and fatigue "here and there", but this has been happening for awhile. Patient reports she believes the fatigue and weakness is related to her age and not her diabetes.  How often are you checking your blood sugar? once daily  What are your blood sugars ranging?  Patient reports her blood sugars are doing "good", but unsure what her readings are at the moment. Patient reports her A1C was 6.1 on 08/02/2021.  During the week, how often does your blood glucose drop below 70? Never  Are you checking your feet daily/regularly?   Yes, patient reports she checks her feet daily.  Adherence Review: Is the patient currently on a STATIN medication? Yes Is the patient currently on ACE/ARB medication? No Does the patient have >5 day gap between last estimated fill dates? No   Telephone follow up appointment with care management team member scheduled for:  10/10/2021 at 3:45 PM  Bessie Kellihan,CPA Clinical Pharmacist Assistant [phone]   

## 2021-09-21 ENCOUNTER — Inpatient Hospital Stay: Payer: Medicare Other | Attending: Oncology

## 2021-09-21 DIAGNOSIS — C9000 Multiple myeloma not having achieved remission: Secondary | ICD-10-CM | POA: Diagnosis present

## 2021-09-21 DIAGNOSIS — R600 Localized edema: Secondary | ICD-10-CM | POA: Insufficient documentation

## 2021-09-21 DIAGNOSIS — D72829 Elevated white blood cell count, unspecified: Secondary | ICD-10-CM | POA: Insufficient documentation

## 2021-09-21 DIAGNOSIS — D72820 Lymphocytosis (symptomatic): Secondary | ICD-10-CM | POA: Insufficient documentation

## 2021-09-21 DIAGNOSIS — N189 Chronic kidney disease, unspecified: Secondary | ICD-10-CM | POA: Diagnosis not present

## 2021-09-21 DIAGNOSIS — E1122 Type 2 diabetes mellitus with diabetic chronic kidney disease: Secondary | ICD-10-CM | POA: Diagnosis not present

## 2021-09-21 DIAGNOSIS — N184 Chronic kidney disease, stage 4 (severe): Secondary | ICD-10-CM

## 2021-09-21 DIAGNOSIS — Z809 Family history of malignant neoplasm, unspecified: Secondary | ICD-10-CM | POA: Diagnosis not present

## 2021-09-21 DIAGNOSIS — Z90710 Acquired absence of both cervix and uterus: Secondary | ICD-10-CM | POA: Insufficient documentation

## 2021-09-21 DIAGNOSIS — I10 Essential (primary) hypertension: Secondary | ICD-10-CM | POA: Insufficient documentation

## 2021-09-21 LAB — CBC WITH DIFFERENTIAL/PLATELET
Abs Immature Granulocytes: 0.04 10*3/uL (ref 0.00–0.07)
Basophils Absolute: 0.1 10*3/uL (ref 0.0–0.1)
Basophils Relative: 1 %
Eosinophils Absolute: 0.2 10*3/uL (ref 0.0–0.5)
Eosinophils Relative: 2 %
HCT: 32.9 % — ABNORMAL LOW (ref 36.0–46.0)
Hemoglobin: 11.1 g/dL — ABNORMAL LOW (ref 12.0–15.0)
Immature Granulocytes: 0 %
Lymphocytes Relative: 27 %
Lymphs Abs: 2.8 10*3/uL (ref 0.7–4.0)
MCH: 34.6 pg — ABNORMAL HIGH (ref 26.0–34.0)
MCHC: 33.7 g/dL (ref 30.0–36.0)
MCV: 102.5 fL — ABNORMAL HIGH (ref 80.0–100.0)
Monocytes Absolute: 0.7 10*3/uL (ref 0.1–1.0)
Monocytes Relative: 7 %
Neutro Abs: 6.5 10*3/uL (ref 1.7–7.7)
Neutrophils Relative %: 63 %
Platelets: 252 10*3/uL (ref 150–400)
RBC: 3.21 MIL/uL — ABNORMAL LOW (ref 3.87–5.11)
RDW: 13.1 % (ref 11.5–15.5)
WBC: 10.3 10*3/uL (ref 4.0–10.5)
nRBC: 0 % (ref 0.0–0.2)

## 2021-09-21 LAB — COMPREHENSIVE METABOLIC PANEL
ALT: 24 U/L (ref 0–44)
AST: 24 U/L (ref 15–41)
Albumin: 3.6 g/dL (ref 3.5–5.0)
Alkaline Phosphatase: 96 U/L (ref 38–126)
Anion gap: 11 (ref 5–15)
BUN: 42 mg/dL — ABNORMAL HIGH (ref 8–23)
CO2: 19 mmol/L — ABNORMAL LOW (ref 22–32)
Calcium: 8.9 mg/dL (ref 8.9–10.3)
Chloride: 103 mmol/L (ref 98–111)
Creatinine, Ser: 3.4 mg/dL — ABNORMAL HIGH (ref 0.44–1.00)
GFR, Estimated: 13 mL/min — ABNORMAL LOW (ref >60)
Glucose, Bld: 141 mg/dL — ABNORMAL HIGH (ref 70–99)
Potassium: 3.5 mmol/L (ref 3.5–5.1)
Sodium: 133 mmol/L — ABNORMAL LOW (ref 135–145)
Total Bilirubin: 0.8 mg/dL (ref 0.3–1.2)
Total Protein: 6.8 g/dL (ref 6.5–8.1)

## 2021-09-22 ENCOUNTER — Other Ambulatory Visit: Payer: Medicare Other

## 2021-09-22 LAB — KAPPA/LAMBDA LIGHT CHAINS
Kappa free light chain: 349.8 mg/L — ABNORMAL HIGH (ref 3.3–19.4)
Kappa, lambda light chain ratio: 8.86 — ABNORMAL HIGH (ref 0.26–1.65)
Lambda free light chains: 39.5 mg/L — ABNORMAL HIGH (ref 5.7–26.3)

## 2021-09-27 LAB — MULTIPLE MYELOMA PANEL, SERUM
Albumin SerPl Elph-Mcnc: 3.5 g/dL (ref 2.9–4.4)
Albumin/Glob SerPl: 1.2 (ref 0.7–1.7)
Alpha 1: 0.2 g/dL (ref 0.0–0.4)
Alpha2 Glob SerPl Elph-Mcnc: 1.3 g/dL — ABNORMAL HIGH (ref 0.4–1.0)
B-Globulin SerPl Elph-Mcnc: 0.7 g/dL (ref 0.7–1.3)
Gamma Glob SerPl Elph-Mcnc: 0.7 g/dL (ref 0.4–1.8)
Globulin, Total: 3 g/dL (ref 2.2–3.9)
IgA: 687 mg/dL — ABNORMAL HIGH (ref 64–422)
IgG (Immunoglobin G), Serum: 661 mg/dL (ref 586–1602)
IgM (Immunoglobulin M), Srm: 96 mg/dL (ref 26–217)
Total Protein ELP: 6.5 g/dL (ref 6.0–8.5)

## 2021-09-28 ENCOUNTER — Encounter: Payer: Self-pay | Admitting: Oncology

## 2021-09-28 ENCOUNTER — Inpatient Hospital Stay: Payer: Medicare Other | Admitting: Oncology

## 2021-09-28 VITALS — BP 113/80 | HR 67 | Temp 97.4°F | Resp 18 | Wt 182.9 lb

## 2021-09-28 DIAGNOSIS — D72820 Lymphocytosis (symptomatic): Secondary | ICD-10-CM

## 2021-09-28 DIAGNOSIS — D472 Monoclonal gammopathy: Secondary | ICD-10-CM | POA: Diagnosis not present

## 2021-09-28 DIAGNOSIS — C9000 Multiple myeloma not having achieved remission: Secondary | ICD-10-CM | POA: Diagnosis not present

## 2021-09-28 NOTE — Progress Notes (Signed)
Pt here for follow up. She reports she has been having balance issue, especially whe she gets up. Pt also reports getting cold easily.

## 2021-09-28 NOTE — Progress Notes (Signed)
Hematology/Oncology Progress note [phone] [phone]      Patient Care Team: [name], [name] as PCP - General (Family Medicine) Lorelee Cover., MD as Consulting Physician (Ophthalmology) Lovell Sheehan, MD as Consulting Physician (Orthopedic Surgery) Murlean Iba, MD (Nephrology) Earlie Server, MD as Consulting Physician (Oncology) Germaine Pomfret, Surgical [facility] (Pharmacist)  REFERRING PROVIDER: [name], [name]  CHIEF COMPLAINTS/REASON FOR VISIT:  Multiple myeloma, monoclonal lymphocytosis.   HISTORY OF PRESENTING ILLNESS:  Donna Malone is a  76 y.o.  female with PMH listed below who was referred to me for evaluation of leukocytosis Reviewed patient' recent labs obtained by PCP.   10/22/2019 CBC showed elevated white count of 11.3, predominantly lymphocytosis.  Normal hemoglobin and platelet count. Previous lab records reviewed. Leukocytosis onset of chronic, duration is since January 2021.   No aggravating or elevated factors. Associated symptoms or signs:  Denies weight loss, fever, chills,night sweats.  Endorses fatigue Smoking history: Never smoker History of recent oral steroid use or steroid injection: No recent steroid injections. History of recent infection: Denies Autoimmune disease history.  Denies  Patient denies any chronic wound, prosthesis.  She has chronic knee arthritis.  #  flow cytometry showed CD5+ , CD23 + B-cell population.  CLL/SLL phenotype.  CD38 negative, <1% leukocytes,<5000 cells/ul # 06/29/2021, bone marrow biopsy showed hyper cellular marrow with plasma cell neoplasm, 10 to 15% plasma cell infiltrates, predominant.  Cytogenetics is normal.  Multiple myeloma FISH panel normal.  Standard risk. INTERVAL HISTORY Donna Malone is a 76 y.o. female who has above history reviewed by me today presents for follow up smothering multiple myeloma. Patient has no new complaints.  Patient takes Lasix 40 mg daily for lower  extremity swelling.  Review of Systems  Constitutional:  Positive for fatigue. Negative for appetite change, chills and fever.  HENT:   Negative for hearing loss and voice change.   Eyes:  Negative for eye problems.  Respiratory:  Negative for chest tightness and cough.   Cardiovascular:  Negative for chest pain.  Gastrointestinal:  Negative for abdominal distention, abdominal pain and blood in stool.  Endocrine: Negative for hot flashes.  Genitourinary:  Negative for difficulty urinating and frequency.   Musculoskeletal:  Positive for arthralgias.  Skin:  Negative for itching and rash.  Neurological:  Negative for extremity weakness.  Hematological:  Negative for adenopathy.  Psychiatric/Behavioral:  Negative for confusion.      MEDICAL HISTORY:  Past Medical History:  Diagnosis Date   Asthma    Bone spur    heel   Depression    Diabetes mellitus without complication ([place])    Gout    Hypertension     SURGICAL HISTORY: Past Surgical History:  Procedure Laterality Date   ABDOMINAL HYSTERECTOMY  04/1970   partial   CHOLECYSTECTOMY  late 1990's    SOCIAL HISTORY: Social History   Socioeconomic History   Marital status: Widowed    Spouse name: [name]   Number of children: 3   Years of education: Not on file   Highest education level: 12th grade  Occupational History   Occupation: retired  Tobacco Use   Smoking status: Never   Smokeless tobacco: Never  Vaping Use   Vaping Use: Never used  Substance and Sexual Activity   Alcohol use: No   Drug use: No   Sexual activity: Not on file  Other Topics Concern   Not on file  Social History Narrative   Not on file  Social Determinants of Health   Financial Resource Strain: Low Risk  (06/01/2021)   Overall Financial Resource Strain (CARDIA)    Difficulty of Paying Living Expenses: Not hard at all  Food Insecurity: No Food Insecurity (04/27/2021)   Hunger Vital Sign    Worried About Running Out of Food in the Last  Year: Never true    Ran Out of Food in the Last Year: Never true  Transportation Needs: No Transportation Needs (04/27/2021)   PRAPARE - Hydrologist (Medical): No    Lack of Transportation (Non-Medical): No  Physical Activity: Inactive (04/27/2021)   Exercise Vital Sign    Days of Exercise per Week: 0 days    Minutes of Exercise per Session: 0 min  Stress: No Stress Concern Present (04/27/2021)   [place]    Feeling of Stress : Only a little  Social Connections: Moderately Isolated (04/27/2021)   Social Connection and Isolation Panel [NHANES]    Frequency of Communication with Friends and Family: More than three times a week    Frequency of Social Gatherings with Friends and Family: Twice a week    Attends Religious Services: 1 to 4 times per year    Active Member of [employer] or Organizations: No    Attends Archivist Meetings: Never    Marital Status: Widowed  Intimate Partner Violence: Not At Risk (04/27/2021)   Humiliation, Afraid, Rape, and Kick questionnaire    Fear of Current or Ex-Partner: No    Emotionally Abused: No    Physically Abused: No    Sexually Abused: No    FAMILY HISTORY: Family History  Problem Relation Age of Onset   CAD Mother    Heart attack Mother    Heart disease Father    Diabetes Cousin    Cancer Cousin     ALLERGIES:  is allergic to sulfa antibiotics.  MEDICATIONS:  Current Outpatient Medications  Medication Sig Dispense Refill   acetaminophen (TYLENOL) 500 MG tablet Take 500 mg by mouth every 6 (six) hours as needed for mild pain or headache.     allopurinol (ZYLOPRIM) 100 MG tablet TAKE 1 TABLET BY MOUTH EVERY DAY 90 tablet 1   atorvastatin (LIPITOR) 40 MG tablet TAKE 1 TABLET BY MOUTH EVERY DAY 90 tablet 0   B Complex Vitamins (GNP VITAMIN B COMPLEX PO) Take by mouth daily.      Blood Glucose Monitoring Suppl (ONETOUCH VERIO) w/Device KIT To  check blood sugar once daily 1 kit 0   buPROPion (WELLBUTRIN XL) 150 MG 24 hr tablet TAKE 1 TABLET BY MOUTH EVERY DAY 90 tablet 1   escitalopram (LEXAPRO) 20 MG tablet TAKE 1 TABLET BY MOUTH EVERY DAY 90 tablet 0   famotidine (PEPCID) 40 MG tablet TAKE 1 TABLET BY MOUTH EVERY DAY 90 tablet 3   ferrous sulfate 325 (65 FE) MG EC tablet Take 325 mg by mouth daily.     furosemide (LASIX) 40 MG tablet TAKE 1 TABLET BY MOUTH EVERY DAY 90 tablet 3   glipiZIDE (GLUCOTROL XL) 10 MG 24 hr tablet TAKE 1 TABLET (10 MG TOTAL) BY MOUTH DAILY WITH BREAKFAST. 90 tablet 0   KRILL OIL PO Take by mouth.     Lancets (ONETOUCH ULTRASOFT) lancets To check blood sugar once daily 100 each 12   levothyroxine (SYNTHROID) 75 MCG tablet TAKE 1 TABLET BY MOUTH EVERY DAY 90 tablet 3   MAGNESIUM-OXIDE PO Take  by mouth.     Multiple Vitamins-Minerals (PRESERVISION AREDS 2) CAPS Take 1 capsule by mouth in the morning and at bedtime.     ONETOUCH VERIO test strip TO CHECK BLOOD SUGAR ONCE DAILY 50 strip 25   Polyethyl Glycol-Propyl Glycol (SYSTANE HYDRATION PF OP) Place 1 drop into both eyes daily as needed (for dry eyes).     traZODone (DESYREL) 50 MG tablet TAKE 1 TABLET BY MOUTH EVERYDAY AT BEDTIME 90 tablet 1   Vitamin D, Ergocalciferol, (DRISDOL) 1.25 MG (50000 UNIT) CAPS capsule TAKE 1 CAPSULE (50,000 UNITS TOTAL) BY MOUTH EVERY 7 (SEVEN) DAYS 12 capsule 3   ondansetron (ZOFRAN) 4 MG tablet Take 1 tablet (4 mg total) by mouth every 8 (eight) hours as needed for nausea or vomiting. (Patient not taking: Reported on 09/28/2021) 30 tablet 1   No current facility-administered medications for this visit.     PHYSICAL EXAMINATION: ECOG PERFORMANCE STATUS: 1 - Symptomatic but completely ambulatory Vitals:   09/28/21 0839  BP: 113/80  Pulse: 67  Resp: 18  Temp: (!) 97.4 F (36.3 C)   Filed Weights   09/28/21 0839  Weight: 182 lb 14.4 oz (83 kg)    Physical Exam Constitutional:      General: She is not in acute  distress.    Comments: Patient walks independently  HENT:     Head: Normocephalic and atraumatic.  Eyes:     General: No scleral icterus. Cardiovascular:     Rate and Rhythm: Normal rate and regular rhythm.     Heart sounds: Normal heart sounds.  Pulmonary:     Effort: Pulmonary effort is normal. No respiratory distress.     Breath sounds: No wheezing.  Abdominal:     General: Bowel sounds are normal. There is no distension.     Palpations: Abdomen is soft.  Musculoskeletal:        General: No deformity. Normal range of motion.     Cervical back: Normal range of motion and neck supple.  Skin:    General: Skin is warm and dry.     Findings: No erythema or rash.  Neurological:     Mental Status: She is alert and oriented to person, place, and time. Mental status is at baseline.     Cranial Nerves: No cranial nerve deficit.     Coordination: Coordination normal.  Psychiatric:        Mood and Affect: Mood normal.        Latest Ref Rng & Units 09/21/2021   10:22 AM  CMP  Glucose 70 - 99 mg/dL 141   BUN 8 - 23 mg/dL 42   Creatinine 0.44 - 1.00 mg/dL 3.40   Sodium 135 - 145 mmol/L 133   Potassium 3.5 - 5.1 mmol/L 3.5   Chloride 98 - 111 mmol/L 103   CO2 22 - 32 mmol/L 19   Calcium 8.9 - 10.3 mg/dL 8.9   Total Protein 6.5 - 8.1 g/dL 6.8   Total Bilirubin 0.3 - 1.2 mg/dL 0.8   Alkaline Phos 38 - 126 U/L 96   AST 15 - 41 U/L 24   ALT 0 - 44 U/L 24       Latest Ref Rng & Units 09/21/2021   10:22 AM  CBC  WBC 4.0 - 10.5 K/uL 10.3   Hemoglobin 12.0 - 15.0 g/dL 11.1   Hematocrit 36.0 - 46.0 % 32.9   Platelets 150 - 400 K/uL 252      RADIOGRAPHIC STUDIES:  I have personally reviewed the radiological images as listed and agreed with the findings in the report. No results found.  LABORATORY DATA:  I have reviewed the data as listed Lab Results  Component Value Date   WBC 10.3 09/21/2021   HGB 11.1 (L) 09/21/2021   HCT 32.9 (L) 09/21/2021   MCV 102.5 (H) 09/21/2021    PLT 252 09/21/2021   Recent Labs    11/17/20 1256 03/17/21 1145 05/04/21 1408 09/21/21 1022  NA 136 136 136 133*  K 4.0 3.3* 3.6 3.5  CL 97* 101 99 103  CO2 30 21* 20 19*  GLUCOSE 263* 171* 144* 141*  BUN 21 33* 28* 42*  CREATININE 1.54* 2.05* 1.82* 3.40*  CALCIUM 9.0 9.2 9.9 8.9  GFRNONAA 35* 25*  --  13*  PROT 6.8 7.2 7.4 6.8  ALBUMIN 3.3* 3.8 4.1 3.6  AST _0 ALT _1 ALKPHOS 70 92 126* 96  BILITOT 0.7 0.2* 0.4 0.8    Iron/TIBC/Ferritin/ %Sat    Component Value Date/Time   IRON 56 04/23/2017 1644   TIBC 362 04/23/2017 1644   FERRITIN 19 04/23/2017 1644   IRONPCTSAT 15 04/23/2017 1644        ASSESSMENT & PLAN:  1. 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 Patient has no  hypercalcemia, Hb>10, no bone lesions on x-ray skeletal survey as well as PET scan.  Patient has had decrease of her kidney function.  Creatinine has increased to 3.4.  This could be secondary to overdiuresis.  Or progression of chronic kidney disease or involvement of myeloma.  She declined kidney biopsy previously. I recommend patient to back off on Lasix daily, only use if she has severe lower extremity edema. Repeat kidney function in 2 weeks. I have updated patient's primary care provider via secure chat.  We will send a message to nephrology Dr. Candiss Norse.   #Monoclonal B-cell lymphocytosis of unknown significance Observation.   Orders Placed This Encounter  Procedures   Basic metabolic panel    Standing Status:   Future    Standing Expiration Date:   09/28/2022   CBC with Differential/Platelet    Standing Status:   Future    Standing Expiration Date:   09/29/2022   Comprehensive metabolic panel    Standing Status:   Future    Standing Expiration Date:   09/28/2022   Multiple  Myeloma Panel (SPEP&IFE w/QIG)    Standing Status:   Future    Standing Expiration Date:   09/28/2022   Kappa/lambda light chains    Standing Status:   Future    Standing Expiration Date:   09/28/2022    All questions were answered. The patient knows to call the clinic with any problems questions or concerns.  Return of visit: 3 months Earlie Server, MD, PhD  09/28/2021

## 2021-10-09 ENCOUNTER — Ambulatory Visit: Payer: Medicare Other | Admitting: Certified Registered"

## 2021-10-09 ENCOUNTER — Encounter
Admission: RE | Disposition: A | Discharge status: Home / Self Care | Payer: Self-pay | Source: Home / Self Care | Attending: Gastroenterology

## 2021-10-09 ENCOUNTER — Telehealth: Payer: Self-pay

## 2021-10-09 ENCOUNTER — Ambulatory Visit
Admission: RE | Admit: 2021-10-09 | Discharge: 2021-10-09 | Disposition: A | Discharge status: Home / Self Care | Payer: Medicare Other | Attending: Gastroenterology | Admitting: Gastroenterology

## 2021-10-09 DIAGNOSIS — K219 Gastro-esophageal reflux disease without esophagitis: Secondary | ICD-10-CM | POA: Diagnosis not present

## 2021-10-09 DIAGNOSIS — E039 Hypothyroidism, unspecified: Secondary | ICD-10-CM | POA: Insufficient documentation

## 2021-10-09 DIAGNOSIS — D126 Benign neoplasm of colon, unspecified: Secondary | ICD-10-CM

## 2021-10-09 DIAGNOSIS — K635 Polyp of colon: Secondary | ICD-10-CM | POA: Insufficient documentation

## 2021-10-09 DIAGNOSIS — R195 Other fecal abnormalities: Secondary | ICD-10-CM | POA: Insufficient documentation

## 2021-10-09 DIAGNOSIS — D124 Benign neoplasm of descending colon: Secondary | ICD-10-CM | POA: Insufficient documentation

## 2021-10-09 DIAGNOSIS — D122 Benign neoplasm of ascending colon: Secondary | ICD-10-CM | POA: Diagnosis not present

## 2021-10-09 DIAGNOSIS — K5730 Diverticulosis of large intestine without perforation or abscess without bleeding: Secondary | ICD-10-CM | POA: Insufficient documentation

## 2021-10-09 DIAGNOSIS — Z1211 Encounter for screening for malignant neoplasm of colon: Secondary | ICD-10-CM | POA: Insufficient documentation

## 2021-10-09 DIAGNOSIS — E119 Type 2 diabetes mellitus without complications: Secondary | ICD-10-CM | POA: Insufficient documentation

## 2021-10-09 HISTORY — PX: COLONOSCOPY WITH PROPOFOL: SHX5780

## 2021-10-09 LAB — GLUCOSE, CAPILLARY: Glucose-Capillary: 103 mg/dL — ABNORMAL HIGH (ref 70–99)

## 2021-10-09 SURGERY — COLONOSCOPY WITH PROPOFOL
Anesthesia: General

## 2021-10-09 MED ORDER — PROPOFOL 500 MG/50ML IV EMUL
INTRAVENOUS | Status: DC | PRN
  Administered 2021-10-09: 100 ug/kg/min via INTRAVENOUS

## 2021-10-09 MED ORDER — PHENYLEPHRINE HCL (PRESSORS) 10 MG/ML IV SOLN
INTRAVENOUS | Status: DC | PRN
  Administered 2021-10-09: 50 ug via INTRAVENOUS

## 2021-10-09 MED ORDER — LIDOCAINE HCL (CARDIAC) PF 100 MG/5ML IV SOSY
PREFILLED_SYRINGE | INTRAVENOUS | Status: DC | PRN
  Administered 2021-10-09: 50 mg via INTRAVENOUS

## 2021-10-09 MED ORDER — SODIUM CHLORIDE 0.9 % IV SOLN
INTRAVENOUS | Status: DC
  Administered 2021-10-09: 20 mL/h via INTRAVENOUS

## 2021-10-09 MED ORDER — PROPOFOL 10 MG/ML IV BOLUS
INTRAVENOUS | Status: DC | PRN
  Administered 2021-10-09: 60 mg via INTRAVENOUS

## 2021-10-09 NOTE — H&P (Signed)
Jonathon Bellows, MD [address] [place], [place], [place], [address] [address], [place], [place], [place], [address] Phone: [phone]  Fax: [phone]  Primary Care Physician:  [name], [name]   Pre-Procedure History & Physical: HPI:  Donna Malone is a 76 y.o. female is here for an colonoscopy.   Past Medical History:  Diagnosis Date   Asthma    Bone spur    heel   Depression    Diabetes mellitus without complication ([place])    Gout    Hypertension     Past Surgical History:  Procedure Laterality Date   ABDOMINAL HYSTERECTOMY  04/1970   partial   CHOLECYSTECTOMY  late 1990's    Prior to Admission medications   Medication Sig Start Date End Date Taking? Authorizing Provider  acetaminophen (TYLENOL) 500 MG tablet Take 500 mg by mouth every 6 (six) hours as needed for mild pain or headache.   Yes [provider]  allopurinol (ZYLOPRIM) 100 MG tablet TAKE 1 TABLET BY MOUTH EVERY DAY 05/15/21  Yes Tally Joe T, [name]  atorvastatin (LIPITOR) 40 MG tablet TAKE 1 TABLET BY MOUTH EVERY DAY 07/24/21  Yes [name], [name]  B Complex Vitamins (GNP VITAMIN B COMPLEX PO) Take by mouth daily.    Yes [provider]  Blood Glucose Monitoring Suppl (ONETOUCH VERIO) w/Device KIT To check blood sugar once daily 04/20/19  Yes Mar Daring, PA-C  buPROPion (WELLBUTRIN XL) 150 MG 24 hr tablet TAKE 1 TABLET BY MOUTH EVERY DAY 08/22/21  Yes Tally Joe T, [name]  escitalopram (LEXAPRO) 20 MG tablet TAKE 1 TABLET BY MOUTH EVERY DAY 07/24/21  Yes Tally Joe T, [name]  famotidine (PEPCID) 40 MG tablet TAKE 1 TABLET BY MOUTH EVERY DAY 05/24/21  Yes Tally Joe T, [name]  ferrous sulfate 325 (65 FE) MG EC tablet Take 325 mg by mouth daily.   Yes [provider]  furosemide (LASIX) 40 MG tablet TAKE 1 TABLET BY MOUTH EVERY DAY 07/24/21  Yes Tally Joe T, [name]  glipiZIDE (GLUCOTROL XL) 10 MG 24 hr tablet TAKE 1 TABLET (10 MG TOTAL) BY MOUTH DAILY WITH  BREAKFAST. 08/22/21  Yes [name], [name]  KRILL OIL PO Take by mouth.   Yes [provider]  Lancets [facility] ULTRASOFT) lancets To check blood sugar once daily 04/20/19  Yes Mar Daring, PA-C  levothyroxine (SYNTHROID) 75 MCG tablet TAKE 1 TABLET BY MOUTH EVERY DAY 06/19/21  Yes [name], [name]  MAGNESIUM-OXIDE PO Take by mouth.   Yes [provider]  Multiple Vitamins-Minerals (PRESERVISION AREDS 2) CAPS Take 1 capsule by mouth in the morning and at bedtime.   Yes [provider]  ondansetron (ZOFRAN) 4 MG tablet Take 1 tablet (4 mg total) by mouth every 8 (eight) hours as needed for nausea or vomiting. 08/29/21  Yes Jonathon Bellows, MD  [facility] VERIO test strip TO CHECK BLOOD SUGAR ONCE DAILY 07/10/21  Yes [name], [name]  Polyethyl Glycol-Propyl Glycol (SYSTANE HYDRATION PF OP) Place 1 drop into both eyes daily as needed (for dry eyes).   Yes [provider]  traZODone (DESYREL) 50 MG tablet TAKE 1 TABLET BY MOUTH EVERYDAY AT BEDTIME 08/22/21  Yes Tally Joe T, [name]  Vitamin D, Ergocalciferol, (DRISDOL) 1.25 MG (50000 UNIT) CAPS capsule TAKE 1 CAPSULE (50,000 UNITS TOTAL) BY MOUTH EVERY 7 (SEVEN) DAYS 05/26/21  Yes [name], [name]    Allergies as  of 08/29/2021 - Review Complete 08/21/2021  Allergen Reaction Noted   Sulfa antibiotics Other (See Comments) 09/21/2014    Family History  Problem Relation Age of Onset   CAD Mother    Heart attack Mother    Heart disease Father    Diabetes Cousin    Cancer Cousin     Social History   Socioeconomic History   Marital status: Widowed    Spouse name: [name]   Number of children: 3   Years of education: Not on file   Highest education level: 12th grade  Occupational History   Occupation: retired  Tobacco Use   Smoking status: Never   Smokeless tobacco: Never  Vaping Use   Vaping Use: Never used  Substance and Sexual Activity   Alcohol use: No   Drug use: No   Sexual activity:  Not on file  Other Topics Concern   Not on file  Social History Narrative   Not on file   Social Determinants of Health   Financial Resource Strain: Low Risk  (06/01/2021)   Overall Financial Resource Strain (CARDIA)    Difficulty of Paying Living Expenses: Not hard at all  Food Insecurity: No Food Insecurity (04/27/2021)   Hunger Vital Sign    Worried About Running Out of Food in the Last Year: Never true    [place] in the Last Year: Never true  Transportation Needs: No Transportation Needs (04/27/2021)   PRAPARE - Hydrologist (Medical): No    Lack of Transportation (Non-Medical): No  Physical Activity: Inactive (04/27/2021)   Exercise Vital Sign    Days of Exercise per Week: 0 days    Minutes of Exercise per Session: 0 min  Stress: No Stress Concern Present (04/27/2021)   [place]    Feeling of Stress : Only a little  Social Connections: Moderately Isolated (04/27/2021)   Social Connection and Isolation Panel [NHANES]    Frequency of Communication with Friends and Family: More than three times a week    Frequency of Social Gatherings with Friends and Family: Twice a week    Attends Religious Services: 1 to 4 times per year    Active Member of [employer] or Organizations: No    Attends Archivist Meetings: Never    Marital Status: Widowed  Intimate Partner Violence: Not At Risk (04/27/2021)   Humiliation, Afraid, Rape, and Kick questionnaire    Fear of Current or Ex-Partner: No    Emotionally Abused: No    Physically Abused: No    Sexually Abused: No    Review of Systems: See HPI, otherwise negative ROS  Physical Exam: BP 127/60   Pulse 65   Temp (!) 96.5 F (35.8 C) (Temporal)   Resp 20   Ht 5' 3"  (1.6 m)   Wt 80.7 kg   SpO2 99%   BMI 31.53 kg/m  General:   Alert,  pleasant and cooperative in NAD Head:  Normocephalic and atraumatic. Neck:  Supple; no masses  or thyromegaly. Lungs:  Clear throughout to auscultation, normal respiratory effort.    Heart:  +S1, +S2, Regular rate and rhythm, No edema. Abdomen:  Soft, nontender and nondistended. Normal bowel sounds, without guarding, and without rebound.   Neurologic:  Alert and  oriented x4;  grossly normal neurologically.  Impression/Plan: Donna Malone is here for an colonoscopy to be performed for positive cologuard  Risks, benefits, limitations, and alternatives  regarding  colonoscopy have been reviewed with the patient.  Questions have been answered.  All parties agreeable.   Jonathon Bellows, MD  10/09/2021, 8:47 AM

## 2021-10-09 NOTE — Progress Notes (Signed)
Chronic Care Management APPOINTMENT REMINDER   Donna Malone was reminded to have all medications, supplements and any blood glucose and blood pressure readings available for review with Junius Argyle, Pharm. D, at her telephone visit on 10/10/2021 at 3:45 pm.  Patient Confirm appointment.  [place] Pharmacist Assistant [phone]

## 2021-10-09 NOTE — Op Note (Addendum)
[facility] Gastroenterology Patient Name: Donna Malone Procedure Date: 10/09/2021 8:52 AM MRN: [id number] Account #: [id number] Date of Birth: September 21, 1945 Admit Type: Outpatient Age: 76 Room: [facility] ENDO ROOM 1 Gender: Female Note Status: Finalized Instrument Name: [name] [id number] Procedure:             Colonoscopy Indications:           Positive Cologuard test Providers:             Jonathon Bellows MD, MD Referring MD:          Jaci Standard. Rollene Rotunda (Referring MD) Medicines:             Monitored Anesthesia Care Complications:         No immediate complications. Procedure:             Pre-Anesthesia Assessment:                        - Prior to the procedure, a History and Physical was                         performed, and patient medications, allergies and                         sensitivities were reviewed. The patient's tolerance                         of previous anesthesia was reviewed.                        - The risks and benefits of the procedure and the                         sedation options and risks were discussed with the                         patient. All questions were answered and informed                         consent was obtained.                        - ASA Grade Assessment: II - A patient with mild                         systemic disease.                        - ASA Grade Assessment: II - A patient with mild                         systemic disease.                        After obtaining informed consent, the colonoscope was                         passed under direct vision. Throughout the procedure,                         the patient's blood pressure, pulse, and  oxygen                         saturations were monitored continuously. The                         Colonoscope was introduced through the anus and                         advanced to the the cecum, identified by the                         appendiceal orifice. The colonoscopy  was performed                         with ease. The patient tolerated the procedure well.                         The quality of the bowel preparation was excellent. Findings:      The perianal and digital rectal examinations were normal.      Eight sessile polyps were found in the ascending colon and cecum. The       polyps were 4 to 6 mm in size. These polyps were removed with a cold       snare. Resection and retrieval were complete.      Two sessile polyps were found in the ascending colon. The polyps were 5       to 6 mm in size. These polyps were removed with a hot snare. Resection       and retrieval were complete.      Two sessile polyps were found in the ascending colon. The polyps were 3       to 4 mm in size. These polyps were removed with a jumbo cold forceps.       Resection and retrieval were complete.      A 5 mm polyp was found in the descending colon. The polyp was sessile.       The polyp was removed with a cold snare. Resection and retrieval were       complete.      A 30 mm polyp was found in the sigmoid colon. The polyp was       pedunculated. The polyp was removed with a hot snare. Resection and       retrieval were complete. To prevent bleeding after the polypectomy, two       hemostatic clips were successfully placed. There was no bleeding during,       or at the end, of the procedure.      The exam was otherwise without abnormality on direct and retroflexion       views.      Multiple small and large-mouthed diverticula were found in the sigmoid       colon.      The exam was otherwise without abnormality on direct and retroflexion       views. Impression:            - Eight 4 to 6 mm polyps in the ascending colon and in                         the cecum, removed with a cold snare. Resected and  retrieved.                        - Two 5 to 6 mm polyps in the ascending colon, removed                         with a hot snare. Resected and  retrieved.                        - Two 3 to 4 mm polyps in the ascending colon, removed                         with a jumbo cold forceps. Resected and retrieved.                        - One 5 mm polyp in the descending colon, removed with                         a cold snare. Resected and retrieved.                        - One 30 mm polyp in the sigmoid colon, removed with a                         hot snare. Resected and retrieved. Clips were placed.                        - The examination was otherwise normal on direct and                         retroflexion views.                        - Diverticulosis in the sigmoid colon.                        - The examination was otherwise normal on direct and                         retroflexion views.                        - Likely cologuard failure as she had multiple polyps                         which were very likely present for a while Recommendation:        - Discharge patient to home (with escort).                        - Resume previous diet.                        - Continue present medications.                        - Await pathology results.                        - Repeat colonoscopy in 1 year  for surveillance. Jonathon Bellows, MD Jonathon Bellows MD, MD 10/09/2021 9:38:04 AM This report has been signed electronically. Number of Addenda: 0 Note Initiated On: 10/09/2021 8:52 AM Scope Withdrawal Time: 0 hours 25 minutes 6 seconds  Total Procedure Duration: 0 hours 32 minutes 11 seconds  Estimated Blood Loss:  Estimated blood loss: none.      [facility]

## 2021-10-09 NOTE — Anesthesia Preprocedure Evaluation (Signed)
Anesthesia Evaluation  Patient identified by MRN, date of birth, ID band Patient awake    Reviewed: Allergy & Precautions, NPO status , Patient's Chart, lab work & pertinent test results  History of Anesthesia Complications Negative for: history of anesthetic complications  Airway Mallampati: III  TM Distance: <3 FB Neck ROM: full    Dental  (+) Chipped, Upper Dentures   Pulmonary neg shortness of breath, asthma ,    Pulmonary exam normal        Cardiovascular Exercise Tolerance: Good hypertension, (-) angina(-) Past MI and (-) DOE Normal cardiovascular exam     Neuro/Psych negative neurological ROS  negative psych ROS   GI/Hepatic Neg liver ROS, GERD  Controlled,  Endo/Other  diabetesHypothyroidism   Renal/GU Renal disease  negative genitourinary   Musculoskeletal   Abdominal   Peds  Hematology negative hematology ROS (+)   Anesthesia Other Findings Past Medical History: No date: Asthma No date: Bone spur     Comment:  heel No date: Depression No date: Diabetes mellitus without complication (HCC) No date: Gout No date: Hypertension  Past Surgical History: 04/1970: ABDOMINAL HYSTERECTOMY     Comment:  partial late 1990's: CHOLECYSTECTOMY     Reproductive/Obstetrics negative OB ROS                             Anesthesia Physical Anesthesia Plan  ASA: 3  Anesthesia Plan: General   Post-op Pain Management:    Induction: Intravenous  PONV Risk Score and Plan: Propofol infusion and TIVA  Airway Management Planned: Natural Airway and Nasal Cannula  Additional Equipment:   Intra-op Plan:   Post-operative Plan:   Informed Consent: I have reviewed the patients History and Physical, chart, labs and discussed the procedure including the risks, benefits and alternatives for the proposed anesthesia with the patient or authorized representative who has indicated his/her  understanding and acceptance.     Dental Advisory Given  Plan Discussed with: Anesthesiologist, CRNA and Surgeon  Anesthesia Plan Comments: (Patient consented for risks of anesthesia including but not limited to:  - adverse reactions to medications - risk of airway placement if required - damage to eyes, teeth, lips or other oral mucosa - nerve damage due to positioning  - sore throat or hoarseness - Damage to heart, brain, nerves, lungs, other parts of body or loss of life  Patient voiced understanding.)        Anesthesia Quick Evaluation

## 2021-10-09 NOTE — Transfer of Care (Signed)
Immediate Anesthesia Transfer of Care Note  Patient: Donna Malone  Procedure(s) Performed: COLONOSCOPY WITH PROPOFOL  Patient Location: PACU  Anesthesia Type:General  Level of Consciousness: drowsy  Airway & Oxygen Therapy: Patient Spontanous Breathing  Post-op Assessment: Report given to RN and Post -op Vital signs reviewed and stable  Post vital signs: Reviewed and stable  Last Vitals:  Vitals Value Taken Time  BP    Temp    Pulse    Resp    SpO2      Last Pain:  Vitals:   10/09/21 0814  TempSrc: Temporal  PainSc: 0-No pain         Complications: No notable events documented.

## 2021-10-09 NOTE — Anesthesia Postprocedure Evaluation (Signed)
Anesthesia Post Note  Patient: Donna Malone  Procedure(s) Performed: COLONOSCOPY WITH PROPOFOL  Patient location during evaluation: Endoscopy Anesthesia Type: General Level of consciousness: awake and alert Pain management: pain level controlled Vital Signs Assessment: post-procedure vital signs reviewed and stable Respiratory status: spontaneous breathing, nonlabored ventilation, respiratory function stable and patient connected to nasal cannula oxygen Cardiovascular status: blood pressure returned to baseline and stable Postop Assessment: no apparent nausea or vomiting Anesthetic complications: no   No notable events documented.   Last Vitals:  Vitals:   10/09/21 0949 10/09/21 0959  BP: 116/80 127/82  Pulse: 66 65  Resp: 13 20  Temp:    SpO2: 98% 98%    Last Pain:  Vitals:   10/09/21 0959  TempSrc:   PainSc: 0-No pain                 Precious Haws Lashaunta Sicard

## 2021-10-10 ENCOUNTER — Ambulatory Visit (INDEPENDENT_AMBULATORY_CARE_PROVIDER_SITE_OTHER): Payer: Medicare Other

## 2021-10-10 ENCOUNTER — Encounter: Payer: Self-pay | Admitting: Gastroenterology

## 2021-10-10 DIAGNOSIS — E1159 Type 2 diabetes mellitus with other circulatory complications: Secondary | ICD-10-CM

## 2021-10-10 DIAGNOSIS — N1832 Chronic kidney disease, stage 3b: Secondary | ICD-10-CM

## 2021-10-10 LAB — SURGICAL PATHOLOGY

## 2021-10-10 NOTE — Progress Notes (Unsigned)
Chronic Care Management Pharmacy Note  10/10/2021 Name:  Donna Malone MRN:  [id number] DOB:  [birth year]  Summary: Patient presents for CCM follow-up.  -Patient unsure if she has benefited from bupropion.  -tolerating krill oil well.    Recommendations/Changes made from today's visit: -Continue current medications  --Recommend rechecking fasting lipid panel  Plan: CPP follow-up 1 month   Subjective: Donna Malone is an 76 y.o. year old female who is a primary patient of [name], [name].  The CCM team was consulted for assistance with disease management and care coordination needs.    Engaged with patient by telephone for follow up visit in response to provider referral for pharmacy case management and/or care coordination services.   Consent to Services:  The patient was given information about Chronic Care Management services, agreed to services, and gave verbal consent prior to initiation of services.  Please see initial visit note for detailed documentation.   Patient Care Team: [name], [name] as PCP - General (Family Medicine) Lorelee Cover., MD as Consulting Physician (Ophthalmology) Lovell Sheehan, MD as Consulting Physician (Orthopedic Surgery) Murlean Iba, MD (Nephrology) Earlie Server, MD as Consulting Physician (Oncology) Germaine Pomfret, [facility] (Pharmacist)  Recent office visits: 05/05/2021 Tally Joe [name] (PCP) start prednisone Taper pack   05/04/2021 Tally Joe [name] (PCP) Start Wellbutrin 150 mg daily, start trazodone 50 mg daily,AMB Referral to [place], Lab work completed A1C -6.7%, Return in about 6 weeks    04/27/2021 Kirke Shaggy LPN (PCP Office) Medicare Wellness completed, no medication changes noted  Recent consult visits: 06/19/21: Patient presented to Dr. Candiss Norse (Nephrology) for CKD consult.   03/24/2021 Dr. Tasia Catchings MD (Oncology) No Medication Changes noted   11/24/2020 Beckey Rutter NP (Oncology) No Medication  Changes noted, Follow up in 3 months  Hospital visits: None in previous 6 months   Objective:  Lab Results  Component Value Date   CREATININE 3.40 (H) 09/21/2021   BUN 42 (H) 09/21/2021   EGFR 28 (L) 05/04/2021   GFRNONAA 13 (L) 09/21/2021   GFRAA 42 (L) 04/27/2020   NA 133 (L) 09/21/2021   K 3.5 09/21/2021   CALCIUM 8.9 09/21/2021   CO2 19 (L) 09/21/2021   GLUCOSE 141 (H) 09/21/2021    Lab Results  Component Value Date/Time   HGBA1C 6.1 (H) 08/02/2021 02:35 PM   HGBA1C 6.7 (H) 05/04/2021 02:08 PM   MICROALBUR 50 04/20/2019 10:34 AM   MICROALBUR 20 04/09/2017 04:48 PM    Last diabetic Eye exam: No results found for: "HMDIABEYEEXA"  Last diabetic Foot exam: No results found for: "HMDIABFOOTEX"   Lab Results  Component Value Date   CHOL 204 (H) 05/04/2021   HDL 33 (L) 05/04/2021   LDLCALC 91 05/04/2021   TRIG 487 (H) 05/04/2021   CHOLHDL 6.2 (H) 05/04/2021       Latest Ref Rng & Units 09/21/2021   10:22 AM 05/04/2021    2:08 PM 03/17/2021   11:45 AM  Hepatic Function  Total Protein 6.5 - 8.1 g/dL 6.8  7.4  7.2   Albumin 3.5 - 5.0 g/dL 3.6  4.1  3.8   AST 15 - 41 U/L _0 ALT 0 - 44 U/L _1 Alk Phosphatase 38 - 126 U/L 96  126  92   Total Bilirubin 0.3 - 1.2 mg/dL 0.8  0.4  0.2     Lab Results  Component  Value Date/Time   TSH 1.250 05/04/2021 02:08 PM   TSH 0.990 09/28/2020 02:06 PM   FREET4 1.30 05/04/2021 02:08 PM       Latest Ref Rng & Units 09/21/2021   10:22 AM 06/29/2021    7:38 AM 06/14/2021   11:40 AM  CBC  WBC 4.0 - 10.5 K/uL 10.3  9.1  10.2   Hemoglobin 12.0 - 15.0 g/dL 11.1  11.3  11.5   Hematocrit 36.0 - 46.0 % 32.9  34.9  34.9   Platelets 150 - 400 K/uL 252  218  221     Lab Results  Component Value Date/Time   VD25OH 81.9 09/28/2020 02:06 PM   VD25OH 74.2 04/27/2020 09:59 AM    Clinical ASCVD: No  The 10-year ASCVD risk score (Arnett DK, et al., 2019) is: 44.5%   Values used to calculate the score:     Age: 77  years     Sex: Female     Is Non-Hispanic African American: No     Diabetic: Yes     Tobacco smoker: No     Systolic Blood Pressure: 758 mmHg     Is BP treated: Yes     HDL Cholesterol: 33 mg/dL     Total Cholesterol: 204 mg/dL       06/21/2021    1:22 PM 05/04/2021    1:20 PM 04/27/2021    8:24 AM  Depression screen PHQ 2/9  Decreased Interest _0 Down, Depressed, Hopeless _1 PHQ - 2 Score _2 Altered sleeping 0 3 0  Tired, decreased energy _3 Change in appetite _4 Feeling bad or failure about yourself  0 0 0  Trouble concentrating 0 3 0  Moving slowly or fidgety/restless 0 1 0  Suicidal thoughts 0 0 0  PHQ-9 Score _5 Difficult doing work/chores Extremely dIfficult Extremely dIfficult Somewhat difficult    -Last DEXA Scan: 03/10/18   T-Score femoral neck: -1.4  T-Score total hip: -0.5  T-Score lumbar spine: -0.3  Social History   Tobacco Use  Smoking Status Never  Smokeless Tobacco Never   BP Readings from Last 3 Encounters:  10/09/21 127/82  09/28/21 113/80  08/21/21 130/69   Pulse Readings from Last 3 Encounters:  10/09/21 65  09/28/21 67  08/21/21 75   Wt Readings from Last 3 Encounters:  10/09/21 178 lb (80.7 kg)  09/28/21 182 lb 14.4 oz (83 kg)  08/21/21 178 lb (80.7 kg)   BMI Readings from Last 3 Encounters:  10/09/21 31.53 kg/m  09/28/21 32.40 kg/m  08/21/21 31.53 kg/m    Assessment/Interventions: Review of patient past medical history, allergies, medications, health status, including review of consultants reports, laboratory and other test data, was performed as part of comprehensive evaluation and provision of chronic care management services.   SDOH:  (Social Determinants of Health) assessments and interventions performed: Yes   SDOH Screenings   Alcohol Screen: Low Risk  (04/27/2021)   Alcohol Screen    Last Alcohol Screening Score (AUDIT): 0  Depression (PHQ2-9): Medium Risk (06/21/2021)   Depression (PHQ2-9)     PHQ-2 Score: 8  Financial Resource Strain: Low Risk  (06/01/2021)   Overall Financial Resource Strain (CARDIA)    Difficulty of Paying Living Expenses: Not hard at all  Food Insecurity: No Food Insecurity (04/27/2021)   Hunger Vital Sign    Worried About Running Out of Food in the Last  Year: Never true    [place] in the Last Year: Never true  Housing: Low Risk  (04/27/2021)   Housing    Last Housing Risk Score: 0  Physical Activity: Inactive (04/27/2021)   Exercise Vital Sign    Days of Exercise per Week: 0 days    Minutes of Exercise per Session: 0 min  Social Connections: Moderately Isolated (04/27/2021)   Social Connection and Isolation Panel [NHANES]    Frequency of Communication with Friends and Family: More than three times a week    Frequency of Social Gatherings with Friends and Family: Twice a week    Attends Religious Services: 1 to 4 times per year    Active Member of [employer] or Organizations: No    Attends Archivist Meetings: Never    Marital Status: Widowed  Stress: No Stress Concern Present (04/27/2021)   [place]    Feeling of Stress : Only a little  Tobacco Use: Low Risk  (10/10/2021)   Patient History    Smoking Tobacco Use: Never    Smokeless Tobacco Use: Never    Passive Exposure: Not on file  Transportation Needs: No Transportation Needs (04/27/2021)   PRAPARE - Transportation    Lack of Transportation (Medical): No    Lack of Transportation (Non-Medical): No    CCM Care Plan  Allergies  Allergen Reactions   Sulfa Antibiotics Other (See Comments)    Medications Reviewed Today     Reviewed by Frederich Balding, RN (Registered Nurse) on 10/09/21 at [phone]  Med List Status: <None>   Medication Order Taking? Sig Documenting Provider Last Dose Status Informant  acetaminophen (TYLENOL) 500 MG tablet [id number] Yes Take 500 mg by mouth every 6 (six) hours as needed for mild pain or  headache. [provider] Past Week Active   allopurinol (ZYLOPRIM) 100 MG tablet [id number] Yes TAKE 1 TABLET BY MOUTH EVERY DAY [name], [name] Past Week Active   atorvastatin (LIPITOR) 40 MG tablet [id number] Yes TAKE 1 TABLET BY MOUTH EVERY DAY [name], [name] Past Week Active   B Complex Vitamins (GNP VITAMIN B COMPLEX PO) [id number] Yes Take by mouth daily.  [provider] Past Week Active   Blood Glucose Monitoring Suppl [facility] VERIO) w/Device KIT [id number] Yes To check blood sugar once daily Mar Daring, [place] 10/08/2021 Active   buPROPion (WELLBUTRIN XL) 150 MG 24 hr tablet [id number] Yes TAKE 1 TABLET BY MOUTH EVERY DAY [name], [name] Past Week Active   escitalopram (LEXAPRO) 20 MG tablet [id number] Yes TAKE 1 TABLET BY MOUTH EVERY DAY [name], [name] 10/08/2021 Active   famotidine (PEPCID) 40 MG tablet [id number] Yes TAKE 1 TABLET BY MOUTH EVERY DAY [name], [name] Past Week Active   ferrous sulfate 325 (65 FE) MG EC tablet [id number] Yes Take 325 mg by mouth daily. [provider] Past Week Active   furosemide (LASIX) 40 MG tablet [id number] Yes TAKE 1 TABLET BY MOUTH EVERY DAY [name], [name] Past Week Active   glipiZIDE (GLUCOTROL XL) 10 MG 24 hr tablet [id number] Yes TAKE 1 TABLET (10 MG TOTAL) BY MOUTH DAILY WITH BREAKFAST. Tally Joe T, [name] 10/08/2021 Active   KRILL OIL PO [id number] Yes Take by mouth. [provider] Past Week Active   Lancets Glory Rosebush ULTRASOFT) lancets [id number] Yes To check blood sugar once daily Mar Daring, [place] 10/08/2021 Active   levothyroxine (  SYNTHROID) 75 MCG tablet [id number] Yes TAKE 1 TABLET BY MOUTH EVERY DAY [name], [name] 10/08/2021 Active   MAGNESIUM-OXIDE PO [id number] Yes Take by mouth. [provider] Past Week Active   Multiple Vitamins-Minerals (PRESERVISION AREDS 2) CAPS [id number] Yes Take 1 capsule by mouth in the morning and at bedtime. [provider] Past  Week Active   ondansetron (ZOFRAN) 4 MG tablet [id number] Yes Take 1 tablet (4 mg total) by mouth every 8 (eight) hours as needed for nausea or vomiting. Jonathon Bellows, MD Past Week Active   [facility] VERIO test strip [id number] Yes TO CHECK BLOOD SUGAR ONCE DAILY [name], [name] 10/08/2021 Active   Polyethyl Glycol-Propyl Glycol (SYSTANE HYDRATION PF OP) [id number] Yes Place 1 drop into both eyes daily as needed (for dry eyes). [provider] 10/08/2021 Active   traZODone (DESYREL) 50 MG tablet [id number] Yes TAKE 1 TABLET BY MOUTH EVERYDAY AT BEDTIME [name], [name] Past Week Active   Vitamin D, Ergocalciferol, (DRISDOL) 1.25 MG (50000 UNIT) CAPS capsule [id number] Yes TAKE 1 CAPSULE (50,000 UNITS TOTAL) BY MOUTH EVERY 7 (SEVEN) DAYS [name], [name] Past Week Active             Patient Active Problem List   Diagnosis Date Noted   Positive colorectal cancer screening using Cologuard test 08/29/2021   Mild episode of recurrent major depressive disorder ([place]) 08/02/2021   Yeast infection of the skin 08/02/2021   Panniculitis 08/02/2021   Hypotension 08/02/2021   Stage 4 chronic kidney disease ([place]) 07/28/2021   Goals of care, counseling/discussion 07/13/2021   Multiple myeloma ([place]) 07/13/2021   Bone lesion 07/13/2021   Stage 3b chronic kidney disease ([place]) 07/13/2021   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/21/2021   Depression, recurrent ([place]) 06/21/2021   Type 2 diabetes mellitus with stage 4 chronic kidney disease, with long-term current use of insulin ([place]) 05/05/2021   Positive screening for depression on 2-item Patient Health Questionnaire (PHQ-2) 05/04/2021   Severe episode of recurrent major depressive disorder, without psychotic features ([place]) 05/04/2021   Psychophysiological insomnia 05/04/2021   Diabetes mellitus due to underlying condition with stage 4 chronic kidney disease, with long-term current use of  insulin ([place]) 05/04/2021   Hyperlipidemia associated with type 2 diabetes mellitus ([place]) 05/04/2021   Need for influenza vaccination 05/04/2021   Need for shingles vaccine 05/04/2021   Need for tetanus booster 05/04/2021   Right arm pain 10/22/2019   Degenerative arthritis of knee, bilateral 05/13/2018   Hypertension associated with diabetes ([place]) 04/01/2015   Leukocytosis 12/22/2014   Abnormal finding on mammography 09/21/2014   Obesity 09/21/2014   Chronic kidney disease, stage III (moderate) ([place]) 09/21/2014   Type 2 diabetes mellitus with stage 3b chronic kidney disease, without long-term current use of insulin ([place]) 09/21/2014   Accumulation of fluid in tissues 09/21/2014   Esophageal reflux 09/21/2014   Gout 09/21/2014   Hypercholesteremia 09/21/2014   Insomnia 09/21/2014   Vitamin D deficiency, unspecified 09/21/2014   Hypothyroidism 01/15/2014   Depressive disorder 01/15/2014   Low back pain 01/15/2014   Other chest pain 01/15/2014    Immunization History  Administered Date(s) Administered   Fluad Quad(high Dose 65+) 04/20/2019, 05/04/2021   Influenza Split 12/16/2009, 03/21/2011, 01/30/2012   Influenza, High Dose Seasonal PF 02/09/2015, 03/20/2016, 12/24/2016, 02/07/2018   Influenza,inj,Quad PF,6+ Mos 02/06/2013, 12/03/2013   PFIZER(Purple Top)SARS-COV-2 Vaccination 06/19/2019, 07/14/2019, 03/18/2020   Pneumococcal Conjugate-13 12/03/2013   Pneumococcal Polysaccharide-23 03/21/2011  Tdap 04/09/2008, 05/04/2021   Zoster Recombinat (Shingrix) 05/04/2021    Conditions to be addressed/monitored:  Hypertension, Hyperlipidemia, Diabetes, GERD, Chronic Kidney Disease, Hypothyroidism, Depression, Gout, and Insomnia  There are no care plans that you recently modified to display for this patient.      Medication Assistance: None required.  Patient affirms current coverage meets needs.  Compliance/Adherence/Medication fill history: Care Gaps: Covid-19  Vaccine Ophthalmology Exam  Star-Rating Drugs: Glipizide 10 mg last filled 04/12/2021 [facility]. Metformin 500 mg last filled 03/10/2021 [facility] day [facility] at [facility] Atorvastatin 40 mg last filled 03/19/2021 [facility] day [facility].  Patient's preferred pharmacy is:  [id number] [place], [place] - [address] [place] [address] Phone: [phone] Fax: [phone]  Uses pill box? Yes Pt endorses 100% compliance  We discussed: Current pharmacy is preferred with insurance plan and patient is satisfied with pharmacy services Patient decided to: Continue current medication management strategy  Care Plan and Follow Up Patient Decision:  Patient agrees to Care Plan and Follow-up.  Plan: Telephone follow up appointment with care management team member scheduled for:  10/10/2021 at 3:45 PM  Junius Argyle, PharmD, [place], CPP  Clinical Pharmacist Practitioner  [facility] [phone]  Current Barriers:  No barriers noted  Pharmacist Clinical Goal(s):  Patient will maintain control of diabetes as evidenced by A1c less than 8%  through collaboration with PharmD and provider.   Interventions: 1:1 collaboration with [name], [name] regarding development and update of comprehensive plan of care as evidenced by provider attestation and co-signature Inter-disciplinary care team collaboration (see longitudinal plan of care) Comprehensive medication review performed; medication list updated in electronic medical record  Hypertension (BP goal <140/90) -Controlled -Current treatment: Furosemide 40 mg daily: Appropriate, Effective, Safe, Accessible -Medications previously tried: Lisinopril  -Current home readings: Typically 120s/60 -Denies hypotensive/hypertensive symptoms -Recommended to continue current medication  Hyperlipidemia: (LDL goal < 70) -Not ideally controlled -Current treatment: Atorvastatin 40 mg  daily: Appropriate, Effective, Safe, Accessible  Krill Oil 1000 mg daily  -Medications previously tried: Fish Oil ([place])  -Tolerating krill oil well.   -Recommend rechecking fasting lipid panel -Continue current medications  Diabetes (A1c goal <8%) -Controlled -Current medications: Glipizide XL 10 mg daily: Appropriate, Effective, Safe, Accessible  -Medications previously tried: Januvia, Metformin  -Patient does not qualify for CGM at this time.  -Current home glucose readings fasting glucose: 95, 103  -Denies hypoglycemic/hyperglycemic symptoms -Recommended to continue current medication  Depression/Anxiety (Goal: Achieve symptom) -Not ideally controlled -Current treatment: Bupropion XL 150 mg daily: Appropriate, Effective, Safe, Accessible  Escitalopram 20 mg daily: Appropriate, Effective, Safe, Accessible  Trazodone 50 mg nightly: Appropriate, Effective, Safe, Accessible  -Medications previously tried/failed: NA -Low energy, motivation since her husband past in 2021.  -Patient unsure if she has benefited from bupropion.  -Sometimes did not sleep until 4-6 am. Trazodone helped significantly  -Recommended to continue current medication  GERD (Goal: Minimize heartburn) -Controlled -Current treatment  Famotidine 40 mg daily  -Medications previously tried: NA  -Recommended to continue current medication  Gout (Goal: Prevent gout flares) -Controlled -Last Gout Flare: <2-3 years ago  -Current treatment  Allopurinol 100 mg daily -Medications previously tried: NA  -We discussed:  Counseled patient on low purine diet plan. Counseled patient to reduce consumption of high-fructose corn syrup, sweetened soft drinks, fruit juices, meat, and seafood. -Recommended to continue current medication  Osteopenia (Goal Prevent fractures) -Controlled -Patient is not a candidate for  pharmacologic treatment -Current treatment  Ergocalciferol 50,000 units weekly: Appropriate, Effective,  Safe, Accessible -Medications previously tried: NA  -Recommended to continue current medication  Chronic Kidney Disease Stage 4  -Previously was following Dr. Candiss Norse, will follow-up back in March.  -All medications assessed for renal dosing and appropriateness in chronic kidney disease. -Recommended to continue current medication  Patient Goals/Self-Care Activities Patient will:  - check glucose daily before breakfast, document, and provide at future appointments  Follow Up Plan: Telephone follow up appointment with care management team member scheduled for:  10/10/2021 at 3:45 PM

## 2021-10-11 NOTE — Patient Instructions (Signed)
Visit Information It was great speaking with you today!  Please let me know if you have any questions about our visit.   Goals Addressed             This Visit's Progress    Monitor and Manage My Blood Sugar-Diabetes Type 2   On track    Timeframe:  Long-Range Goal Priority:  High Start Date: 06/01/2021                            Expected End Date: 06/02/2022                      Follow Up within 30 days   - check blood sugar at prescribed times - check blood sugar if I feel it is too high or too low - enter blood sugar readings and medication or insulin into daily log - take the blood sugar log to all doctor visits    Why is this important?   Checking your blood sugar at home helps to keep it from getting very high or very low.  Writing the results in a diary or log helps the doctor know how to care for you.  Your blood sugar log should have the time, date and the results.  Also, write down the amount of insulin or other medicine that you take.  Other information, like what you ate, exercise done and how you were feeling, will also be helpful.     Notes:         Patient Care Plan: General Pharmacy (Adult)     Problem Identified: Hypertension, Hyperlipidemia, Diabetes, GERD, Chronic Kidney Disease, Hypothyroidism, Depression, Gout, and Insomnia   Priority: High     Long-Range Goal: Patient-Specific Goal   Start Date: 06/01/2021  Expected End Date: 10/11/2021  This Visit's Progress: On track  Recent Progress: On track  Priority: High  Note:   Current Barriers:  No barriers noted  Pharmacist Clinical Goal(s):  Patient will maintain control of diabetes as evidenced by A1c less than 8%  through collaboration with PharmD and provider.   Interventions: 1:1 collaboration with [name], [name] regarding development and update of comprehensive plan of care as evidenced by provider attestation and co-signature Inter-disciplinary care team collaboration (see longitudinal  plan of care) Comprehensive medication review performed; medication list updated in electronic medical record  Hypertension (BP goal <140/90) -Controlled -Current treatment: Furosemide 40 mg daily: Appropriate, Effective, Safe, Accessible -Medications previously tried: Lisinopril  -Current home readings: Typically 120s/60 -Denies hypotensive/hypertensive symptoms -Recommended to continue current medication  Hyperlipidemia: (LDL goal < 70) -Not ideally controlled -Current treatment: Atorvastatin 40 mg daily: Appropriate, Effective, Safe, Accessible  Krill Oil 1000 mg daily  -Medications previously tried: Fish Oil ([place])  -Tolerating krill oil well.   -Recommend rechecking fasting lipid panel -Continue current medications  Diabetes (A1c goal <8%) -Controlled -Current medications: Glipizide XL 10 mg daily: Appropriate, Effective, Safe, Accessible  -Medications previously tried: Januvia, Metformin  -Patient does not qualify for CGM at this time.  -Current home glucose readings fasting glucose: 95, 103  -Denies hypoglycemic/hyperglycemic symptoms -Recommended to continue current medication  Depression/Anxiety (Goal: Achieve symptom) -Not ideally controlled -Current treatment: Bupropion XL 150 mg daily: Appropriate, Effective, Safe, Accessible  Escitalopram 20 mg daily: Appropriate, Effective, Safe, Accessible  Trazodone 50 mg nightly: Appropriate, Effective, Safe, Accessible  -Medications previously tried/failed: NA -Low energy, motivation since her husband past in 2021.  -Patient  unsure if she has benefited from bupropion.  -Sometimes did not sleep until 4-6 am. Trazodone helped significantly  -Recommended to continue current medication  GERD (Goal: Minimize heartburn) -Controlled -Current treatment  Famotidine 40 mg daily  -Medications previously tried: NA  -Recommended to continue current medication  Gout (Goal: Prevent gout flares) -Controlled -Last Gout  Flare: <2-3 years ago  -Current treatment  Allopurinol 100 mg daily -Medications previously tried: NA  -We discussed:  Counseled patient on low purine diet plan. Counseled patient to reduce consumption of high-fructose corn syrup, sweetened soft drinks, fruit juices, meat, and seafood. -Recommended to continue current medication  Osteopenia (Goal Prevent fractures) -Controlled -Patient is not a candidate for pharmacologic treatment -Current treatment  Ergocalciferol 50,000 units weekly: Appropriate, Effective, Safe, Accessible -Medications previously tried: NA  -Recommended to continue current medication  Chronic Kidney Disease Stage 4  -Previously was following Dr. Candiss Norse, will follow-up back in March.  -All medications assessed for renal dosing and appropriateness in chronic kidney disease. -Recommended to continue current medication  Patient Goals/Self-Care Activities Patient will:  - check glucose daily before breakfast, document, and provide at future appointments  Follow Up Plan: No further follow up required: Patient will reach out to clinical team as needed.    Patient agreed to services and verbal consent obtained.   Patient verbalizes understanding of instructions and care plan provided today and agrees to view in [place]. Active MyChart status and patient understanding of how to access instructions and care plan via MyChart confirmed with patient.     Junius Argyle, PharmD, [place], CPP  Clinical Pharmacist Practitioner  [facility] [phone]

## 2021-10-12 ENCOUNTER — Inpatient Hospital Stay: Payer: Medicare Other | Attending: Oncology

## 2021-10-12 DIAGNOSIS — D472 Monoclonal gammopathy: Secondary | ICD-10-CM

## 2021-10-12 DIAGNOSIS — D72820 Lymphocytosis (symptomatic): Secondary | ICD-10-CM | POA: Diagnosis not present

## 2021-10-12 DIAGNOSIS — C9000 Multiple myeloma not having achieved remission: Secondary | ICD-10-CM | POA: Diagnosis present

## 2021-10-12 LAB — BASIC METABOLIC PANEL
Anion gap: 10 (ref 5–15)
BUN: 24 mg/dL — ABNORMAL HIGH (ref 8–23)
CO2: 19 mmol/L — ABNORMAL LOW (ref 22–32)
Calcium: 8.9 mg/dL (ref 8.9–10.3)
Chloride: 112 mmol/L — ABNORMAL HIGH (ref 98–111)
Creatinine, Ser: 2.23 mg/dL — ABNORMAL HIGH (ref 0.44–1.00)
GFR, Estimated: 22 mL/min — ABNORMAL LOW (ref >60)
Glucose, Bld: 122 mg/dL — ABNORMAL HIGH (ref 70–99)
Potassium: 3.9 mmol/L (ref 3.5–5.1)
Sodium: 141 mmol/L (ref 135–145)

## 2021-10-17 ENCOUNTER — Ambulatory Visit: Payer: Self-pay | Admitting: *Deleted

## 2021-10-17 ENCOUNTER — Other Ambulatory Visit: Payer: Self-pay | Admitting: Family Medicine

## 2021-10-17 DIAGNOSIS — M109 Gout, unspecified: Secondary | ICD-10-CM

## 2021-10-17 MED ORDER — PREDNISONE 10 MG (21) PO TBPK: ORAL_TABLET | ORAL | 0 refills | Status: DC

## 2021-10-17 MED ORDER — PREDNISONE 20 MG PO TABS: 40.0000 mg | ORAL_TABLET | Freq: Every day | ORAL | 0 refills | Status: DC

## 2021-10-17 NOTE — Telephone Encounter (Signed)
Reason for Disposition  [1] SEVERE pain (e.g., excruciating, unable to do any normal activities) AND [2] not improved after 2 hours of pain medicine    Having bad "gout flare up"   Out of colchicine, expired needs refill.  Answer Assessment - Initial Assessment Questions 1. ONSET: "When did the pain start?"      I have gout in my foot and ankle.   Left side.   It is hurting so bad when I put any weight on it.    I take allopurinol every day.     I have colchicine.   It has expired.   They sent me #180 tablets but they have expired.  I haven't had a flare up in a long time.  Can she renew my rx for the colchicine?   2. LOCATION: "Where is the pain located?"      My foot hurts so bad.   I'm using a walker.  Left foot and ankle.  No pain in toes. 3. PAIN: "How bad is the pain?"    (Scale 1-10; or mild, moderate, severe)  - MILD (1-3): doesn't interfere with normal activities.   - MODERATE (4-7): interferes with normal activities (e.g., work or school) or awakens from sleep, limping.   - SEVERE (8-10): excruciating pain, unable to do any normal activities, unable to walk.      Severe can't bear weight on it.  My grandson had to get my mail yesterday. 4. WORK OR EXERCISE: "Has there been any recent work or exercise that involved this part of the body?"      No   I have gout. 5. CAUSE: "What do you think is causing the foot pain?"     Gout.   It's really bad.   6. OTHER SYMPTOMS: "Do you have any other symptoms?" (e.g., leg pain, rash, fever, numbness)     No falls or accidents or injuries   Would like to take the colchicine before making an appt at this time.   "I can't get there anyway".   7. PREGNANCY: "Is there any chance you are pregnant?" "When was your last menstrual period?"     N/A  Protocols used: Foot Pain-A-AH

## 2021-10-17 NOTE — Telephone Encounter (Signed)
Spoke with patient, she stated her understanding.  

## 2021-10-17 NOTE — Telephone Encounter (Signed)
  Chief Complaint: pain in left foot and ankle from gout flare up.   Requesting a refill of her colchicine.   Rx she has now has expired.   Not had a flare up in a long time. Symptoms: left foot and ankle pain.   Unable to bear weight.   Using her walker Frequency: Last few days Pertinent Negatives: Patient denies injuries, accidents or falls Disposition: '[]'$ ED /'[]'$ Urgent Care (no appt availability in office) / '[]'$ Appointment(In office/virtual)/ '[]'$  [facility]/ '[]'$ Home Care/ '[]'$ Refused Recommended Disposition /'[]'$ [facility]/ '[x]'$  Follow-up with PCP Additional Notes: Requesting refill of colchicine.   Is taking the allopurinol daily.  Did not want an appt.   "I can't get there anyway".   I just need the colchicine refilled.     Refill request submitted.   She also wants to know can she double up her dose of the allopurinol?    Colchicine not noted on her medication list.

## 2021-10-20 ENCOUNTER — Telehealth: Payer: Self-pay

## 2021-10-20 NOTE — Telephone Encounter (Signed)
Pending for review #[id number] with effective date 7/30

## 2021-10-26 ENCOUNTER — Telehealth: Payer: Self-pay

## 2021-10-26 NOTE — Telephone Encounter (Signed)
Uploaded photos/notes to [facility] [id number]

## 2021-10-30 ENCOUNTER — Telehealth: Payer: Self-pay

## 2021-10-30 DIAGNOSIS — E1159 Type 2 diabetes mellitus with other circulatory complications: Secondary | ICD-10-CM | POA: Diagnosis not present

## 2021-10-30 DIAGNOSIS — N1832 Chronic kidney disease, stage 3b: Secondary | ICD-10-CM

## 2021-10-30 DIAGNOSIS — E1122 Type 2 diabetes mellitus with diabetic chronic kidney disease: Secondary | ICD-10-CM | POA: Diagnosis not present

## 2021-10-30 DIAGNOSIS — I152 Hypertension secondary to endocrine disorders: Secondary | ICD-10-CM

## 2021-10-30 NOTE — Telephone Encounter (Addendum)
CPT 61224 approved SLPN#[id number] Good thru 7/30-10/28/2023

## 2021-10-30 NOTE — Progress Notes (Deleted)
Established patient visit   Patient: Donna Malone   DOB: [birth year]   76 y.o. Female  MRN: [id number] Visit Date: 10/31/2021  Today's healthcare provider: [name], [name]   No chief complaint on file.  Subjective    HPI  Follow up for Hypotension:  The patient was last seen for this 2 months ago. Changes made at last visit include; Encouraged to follow up with Bps at home and let us know if they are <100 SBP.  She reports {excellent/good/fair/poor:19665} compliance with treatment. She feels that condition is {improved/worse/unchanged:3041574}. She {is/is not:21021397} having side effects. ***  ----------------------------------------------------------------------------------------- Depression, Follow-up  She  was last seen for this 2 months ago. Changes made at last visit include Continue wellbutrin 173m, Lexapro 20 mg, and Trazodone 50 mg qHS.   She reports {excellent/good/fair/poor:19665} compliance with treatment. She {is/is not:21021397} having side effects. ***  She reports {DESC; GOOD/FAIR/POOR:18685} tolerance of treatment. Current symptoms include: {Symptoms; depression:1002} She feels she is {improved/worse/unchanged:3041574} since last visit.     06/21/2021    1:22 PM 05/04/2021    1:20 PM 04/27/2021    8:24 AM  Depression screen PHQ 2/9  Decreased Interest 2 3 2   Down, Depressed, Hopeless 1 3 2   PHQ - 2 Score 3 6 4   Altered sleeping 0 3 0  Tired, decreased energy 3 3 3   Change in appetite 2 3 2   Feeling bad or failure about yourself  0 0 0  Trouble concentrating 0 3 0  Moving slowly or fidgety/restless 0 1 0  Suicidal thoughts 0 0 0  PHQ-9 Score 8 19 9   Difficult doing work/chores Extremely dIfficult Extremely dIfficult Somewhat difficult    -----------------------------------------------------------------------------------------   Medications: Outpatient Medications Prior to Visit  Medication Sig   acetaminophen (TYLENOL) 500 MG  tablet Take 500 mg by mouth every 6 (six) hours as needed for mild pain or headache.   allopurinol (ZYLOPRIM) 100 MG tablet TAKE 1 TABLET BY MOUTH EVERY DAY   atorvastatin (LIPITOR) 40 MG tablet TAKE 1 TABLET BY MOUTH EVERY DAY   B Complex Vitamins (GNP VITAMIN B COMPLEX PO) Take by mouth daily.    Blood Glucose Monitoring Suppl (ONETOUCH VERIO) w/Device KIT To check blood sugar once daily   buPROPion (WELLBUTRIN XL) 150 MG 24 hr tablet TAKE 1 TABLET BY MOUTH EVERY DAY   escitalopram (LEXAPRO) 20 MG tablet TAKE 1 TABLET BY MOUTH EVERY DAY   famotidine (PEPCID) 40 MG tablet TAKE 1 TABLET BY MOUTH EVERY DAY   ferrous sulfate 325 (65 FE) MG EC tablet Take 325 mg by mouth daily.   furosemide (LASIX) 40 MG tablet TAKE 1 TABLET BY MOUTH EVERY DAY   glipiZIDE (GLUCOTROL XL) 10 MG 24 hr tablet TAKE 1 TABLET (10 MG TOTAL) BY MOUTH DAILY WITH BREAKFAST.   KRILL OIL PO Take by mouth.   Lancets (ONETOUCH ULTRASOFT) lancets To check blood sugar once daily   levothyroxine (SYNTHROID) 75 MCG tablet TAKE 1 TABLET BY MOUTH EVERY DAY   MAGNESIUM-OXIDE PO Take by mouth.   Multiple Vitamins-Minerals (PRESERVISION AREDS 2) CAPS Take 1 capsule by mouth in the morning and at bedtime.   ondansetron (ZOFRAN) 4 MG tablet Take 1 tablet (4 mg total) by mouth every 8 (eight) hours as needed for nausea or vomiting.   ONETOUCH VERIO test strip TO CHECK BLOOD SUGAR ONCE DAILY   Polyethyl Glycol-Propyl Glycol (SYSTANE HYDRATION PF OP) Place 1 drop into both eyes daily as needed (  for dry eyes).   predniSONE (DELTASONE) 20 MG tablet Take 2 tablets (40 mg total) by mouth daily with breakfast.   predniSONE (STERAPRED UNI-PAK 21 TAB) 10 MG (21) TBPK tablet Take following burst at 40 mg.   traZODone (DESYREL) 50 MG tablet TAKE 1 TABLET BY MOUTH EVERYDAY AT BEDTIME   Vitamin D, Ergocalciferol, (DRISDOL) 1.25 MG (50000 UNIT) CAPS capsule TAKE 1 CAPSULE (50,000 UNITS TOTAL) BY MOUTH EVERY 7 (SEVEN) DAYS   No facility-administered  medications prior to visit.    Review of Systems  Constitutional:  Negative for appetite change, chills, fatigue and fever.  Respiratory:  Negative for chest tightness and shortness of breath.   Cardiovascular:  Negative for chest pain and palpitations.  Gastrointestinal:  Negative for abdominal pain, nausea and vomiting.  Neurological:  Negative for dizziness and weakness.    {Labs  Heme  Chem  Endocrine  Serology  Results Review (optional):23779}   Objective    There were no vitals taken for this visit. {Show previous vital signs (optional):23777}  Physical Exam  ***  No results found for any visits on 10/31/21.  Assessment & Plan     ***  No follow-ups on file.      {provider attestation***:1}   [name], [address] [phone] (phone) [phone] (fax)  [place]

## 2021-10-31 ENCOUNTER — Ambulatory Visit: Payer: Medicare Other | Admitting: Family Medicine

## 2021-11-01 ENCOUNTER — Ambulatory Visit (INDEPENDENT_AMBULATORY_CARE_PROVIDER_SITE_OTHER): Payer: Medicare Other | Admitting: Family Medicine

## 2021-11-01 ENCOUNTER — Encounter: Payer: Self-pay | Admitting: Family Medicine

## 2021-11-01 ENCOUNTER — Encounter: Payer: Self-pay | Admitting: *Deleted

## 2021-11-01 VITALS — BP 117/80 | HR 75 | Temp 98.1°F | Ht 62.99 in | Wt 182.2 lb

## 2021-11-01 DIAGNOSIS — N184 Chronic kidney disease, stage 4 (severe): Secondary | ICD-10-CM

## 2021-11-01 DIAGNOSIS — F330 Major depressive disorder, recurrent, mild: Secondary | ICD-10-CM | POA: Diagnosis not present

## 2021-11-01 DIAGNOSIS — Z794 Long term (current) use of insulin: Secondary | ICD-10-CM

## 2021-11-01 DIAGNOSIS — E1122 Type 2 diabetes mellitus with diabetic chronic kidney disease: Secondary | ICD-10-CM | POA: Diagnosis not present

## 2021-11-01 MED ORDER — BLOOD GLUCOSE METER KIT: PACK | 0 refills | Status: DC

## 2021-11-01 NOTE — Progress Notes (Signed)
Established patient visit   Patient: Donna Malone   DOB: 1946-03-02   76 y.o. Female  MRN: [id number] Visit Date: 11/01/2021  Today's healthcare provider: [name], [name]  Introduced to nurse practitioner role and practice setting.  All questions answered.  Discussed provider/patient relationship and expectations.   Chief Complaint  Patient presents with   Hyperlipidemia   Diabetes    Diabetic eye exam is tomorrow at Dr. Purvis Sheffield   Chronic Kidney Disease   Flank Pain    Left flank pain for past 2 weeks    Subjective    Diabetes Mellitus Type II, Follow-up  Lab Results  Component Value Date   HGBA1C 6.1 (H) 08/02/2021   HGBA1C 6.7 (H) 05/04/2021   HGBA1C 6.1 (A) 09/27/2020   Wt Readings from Last 3 Encounters:  11/01/21 182 lb 3.2 oz (82.6 kg)  10/09/21 178 lb (80.7 kg)  09/28/21 182 lb 14.4 oz (83 kg)   Last seen for diabetes 3 months ago.  Management since then includes no changes. She reports good compliance with treatment. She is not having side effects.  Symptoms: Yes fatigue No foot ulcerations  No appetite changes No nausea  No paresthesia of the feet  No polydipsia  No polyuria No visual disturbances   No vomiting     Home blood sugar records: fasting range: up to 120s.  Episodes of hypoglycemia? No    Current insulin regiment:Most Recent Eye Exam: UTD, followed by Dr Gloriann Loan Current exercise: walking Current diet habits: in general, a "healthy" diet    Pertinent Labs: Lab Results  Component Value Date   CHOL 204 (H) 05/04/2021   HDL 33 (L) 05/04/2021   LDLCALC 91 05/04/2021   TRIG 487 (H) 05/04/2021   CHOLHDL 6.2 (H) 05/04/2021   Lab Results  Component Value Date   NA 141 10/12/2021   K 3.9 10/12/2021   CREATININE 2.23 (H) 10/12/2021   GFRNONAA 22 (L) 10/12/2021   MICROALBUR 50 04/20/2019   LABMICR 26.3 05/04/2021     ---------------------------------------------------------------------------------------------------  Follow  up for hypotension  The patient was last seen for this 3 months ago. Changes made at last visit include: Encouraged to follow up with Bps at home and let us know if they are <100 SBP She reports excellent compliance with treatment. She feels that condition is Improved. She is not having side effects.   ----------------------------------------------------------------------------------------- Follow up for Mild episode of recurrent major depressive disorder   The patient was last seen for this 3 months ago. Changes made at last visit include continue current medicaitons.  She reports excellent compliance with treatment. She feels that condition is Improved. She is not having side effects.   -----------------------------------------------------------------------------------------  Medications: Outpatient Medications Prior to Visit  Medication Sig   acetaminophen (TYLENOL) 500 MG tablet Take 500 mg by mouth every 6 (six) hours as needed for mild pain or headache.   allopurinol (ZYLOPRIM) 100 MG tablet TAKE 1 TABLET BY MOUTH EVERY DAY   atorvastatin (LIPITOR) 40 MG tablet TAKE 1 TABLET BY MOUTH EVERY DAY   B Complex Vitamins (GNP VITAMIN B COMPLEX PO) Take by mouth daily.    Blood Glucose Monitoring Suppl (ONETOUCH VERIO) w/Device KIT To check blood sugar once daily   buPROPion (WELLBUTRIN XL) 150 MG 24 hr tablet TAKE 1 TABLET BY MOUTH EVERY DAY   escitalopram (LEXAPRO) 20 MG tablet TAKE 1 TABLET BY MOUTH EVERY DAY   famotidine (PEPCID) 40 MG tablet TAKE 1  TABLET BY MOUTH EVERY DAY   ferrous sulfate 325 (65 FE) MG EC tablet Take 325 mg by mouth daily.   glipiZIDE (GLUCOTROL XL) 10 MG 24 hr tablet TAKE 1 TABLET (10 MG TOTAL) BY MOUTH DAILY WITH BREAKFAST.   KRILL OIL PO Take by mouth.   Lancets (ONETOUCH ULTRASOFT) lancets To check blood sugar once daily   levothyroxine (SYNTHROID) 75 MCG tablet TAKE 1 TABLET BY MOUTH EVERY DAY   MAGNESIUM-OXIDE PO Take by mouth.   Multiple  Vitamins-Minerals (PRESERVISION AREDS 2) CAPS Take 1 capsule by mouth in the morning and at bedtime.   ondansetron (ZOFRAN) 4 MG tablet Take 1 tablet (4 mg total) by mouth every 8 (eight) hours as needed for nausea or vomiting.   ONETOUCH VERIO test strip TO CHECK BLOOD SUGAR ONCE DAILY   Polyethyl Glycol-Propyl Glycol (SYSTANE HYDRATION PF OP) Place 1 drop into both eyes daily as needed (for dry eyes).   Vitamin D, Ergocalciferol, (DRISDOL) 1.25 MG (50000 UNIT) CAPS capsule TAKE 1 CAPSULE (50,000 UNITS TOTAL) BY MOUTH EVERY 7 (SEVEN) DAYS   traZODone (DESYREL) 50 MG tablet TAKE 1 TABLET BY MOUTH EVERYDAY AT BEDTIME (Patient not taking: Reported on 11/01/2021)   [DISCONTINUED] furosemide (LASIX) 40 MG tablet TAKE 1 TABLET BY MOUTH EVERY DAY (Patient not taking: Reported on 11/01/2021)   [DISCONTINUED] predniSONE (DELTASONE) 20 MG tablet Take 2 tablets (40 mg total) by mouth daily with breakfast.   [DISCONTINUED] predniSONE (STERAPRED UNI-PAK 21 TAB) 10 MG (21) TBPK tablet Take following burst at 40 mg.   No facility-administered medications prior to visit.    Review of Systems  Last CBC Lab Results  Component Value Date   WBC 10.3 09/21/2021   HGB 11.1 (L) 09/21/2021   HCT 32.9 (L) 09/21/2021   MCV 102.5 (H) 09/21/2021   MCH 34.6 (H) 09/21/2021   RDW 13.1 09/21/2021   PLT 252 45/99/7741   Last metabolic panel Lab Results  Component Value Date   GLUCOSE 122 (H) 10/12/2021   NA 141 10/12/2021   K 3.9 10/12/2021   CL 112 (H) 10/12/2021   CO2 19 (L) 10/12/2021   BUN 24 (H) 10/12/2021   CREATININE 2.23 (H) 10/12/2021   GFRNONAA 22 (L) 10/12/2021   CALCIUM 8.9 10/12/2021   PHOS 3.1 12/21/2016   PROT 6.8 09/21/2021   ALBUMIN 3.6 09/21/2021   LABGLOB 3.0 09/21/2021   AGRATIO 1.2 05/04/2021   BILITOT 0.8 09/21/2021   ALKPHOS 96 09/21/2021   AST 24 09/21/2021   ALT 24 09/21/2021   ANIONGAP 10 10/12/2021   Last lipids Lab Results  Component Value Date   CHOL 204 (H) 05/04/2021    HDL 33 (L) 05/04/2021   LDLCALC 91 05/04/2021   TRIG 487 (H) 05/04/2021   CHOLHDL 6.2 (H) 05/04/2021   Last hemoglobin A1c Lab Results  Component Value Date   HGBA1C 6.1 (H) 08/02/2021   Last thyroid functions Lab Results  Component Value Date   TSH 1.250 05/04/2021   Last vitamin D Lab Results  Component Value Date   VD25OH 81.9 09/28/2020   Last vitamin B12 and Folate Lab Results  Component Value Date   VITAMINB12 246 03/17/2021   FOLATE 10.5 03/17/2021       Objective    BP 117/80   Pulse 75   Temp 98.1 F (36.7 C) (Oral)   Ht 5' 2.99" (1.6 m)   Wt 182 lb 3.2 oz (82.6 kg)   SpO2 98%   BMI 32.28 kg/m  BP Readings from Last 3 Encounters:  11/01/21 117/80  10/09/21 127/82  09/28/21 113/80   Wt Readings from Last 3 Encounters:  11/01/21 182 lb 3.2 oz (82.6 kg)  10/09/21 178 lb (80.7 kg)  09/28/21 182 lb 14.4 oz (83 kg)   SpO2 Readings from Last 3 Encounters:  11/01/21 98%  10/09/21 98%  08/21/21 96%      Physical Exam Vitals and nursing note reviewed.  Constitutional:      General: She is not in acute distress.    Appearance: Normal appearance. She is obese. She is not ill-appearing, toxic-appearing or diaphoretic.  HENT:     Head: Normocephalic and atraumatic.  Cardiovascular:     Rate and Rhythm: Normal rate and regular rhythm.     Pulses: Normal pulses.     Heart sounds: Normal heart sounds. No murmur heard.    No friction rub. No gallop.  Pulmonary:     Effort: Pulmonary effort is normal. No respiratory distress.     Breath sounds: Normal breath sounds. No stridor. No wheezing, rhonchi or rales.  Chest:     Chest wall: No tenderness.  Abdominal:     General: Bowel sounds are normal.     Palpations: Abdomen is soft.  Musculoskeletal:        General: No swelling, tenderness, deformity or signs of injury. Normal range of motion.     Right lower leg: No edema.     Left lower leg: No edema.  Skin:    General: Skin is warm and dry.      Capillary Refill: Capillary refill takes less than 2 seconds.     Coloration: Skin is not jaundiced or pale.     Findings: No bruising, erythema, lesion or rash.  Neurological:     General: No focal deficit present.     Mental Status: She is alert and oriented to person, place, and time. Mental status is at baseline.     Cranial Nerves: No cranial nerve deficit.     Sensory: No sensory deficit.     Motor: No weakness.     Coordination: Coordination normal.  Psychiatric:        Mood and Affect: Mood normal.        Behavior: Behavior normal.        Thought Content: Thought content normal.        Judgment: Judgment normal.      No results found for any visits on 11/01/21.  Assessment & Plan     Problem List Items Addressed This Visit       Endocrine   Type 2 diabetes mellitus with stage 4 chronic kidney disease, with long-term current use of insulin (HCC) - Primary    Chronic, improved with dietary changes and hydration Repeat labs Continue to recommend balanced, lower carb meals. Smaller meal size, adding snacks. Choosing water as drink of choice and increasing purposeful exercise.       Relevant Medications   blood glucose meter kit and supplies   Other Relevant Orders   Hemoglobin J0R   Basic Metabolic Panel (BMET)     Other   Mild episode of recurrent major depressive disorder (HCC)    Chronic, improved Denies complications Encouraged by upcoming surgery consult for excess skin removal on her abdomen Continue Wellbutrin 150 mg        Return in about 6 months (around 05/04/2022) for chonic disease management.      Vonna Kotyk, [name], have reviewed all  documentation for this visit. The documentation on 11/01/21 for the exam, diagnosis, procedures, and orders are all accurate and complete.    [name], [place] [phone] (phone) [phone] (fax)  [place]

## 2021-11-01 NOTE — Assessment & Plan Note (Signed)
Chronic, improved Denies complications Encouraged by upcoming surgery consult for excess skin removal on her abdomen Continue Wellbutrin 150 mg

## 2021-11-01 NOTE — Assessment & Plan Note (Signed)
Chronic, improved with dietary changes and hydration Repeat labs Continue to recommend balanced, lower carb meals. Smaller meal size, adding snacks. Choosing water as drink of choice and increasing purposeful exercise.

## 2021-11-02 ENCOUNTER — Other Ambulatory Visit: Payer: Self-pay | Admitting: Oncology

## 2021-11-02 LAB — BASIC METABOLIC PANEL
BUN/Creatinine Ratio: 10 — ABNORMAL LOW (ref 12–28)
BUN: 22 mg/dL (ref 8–27)
CO2: 19 mmol/L — ABNORMAL LOW (ref 20–29)
Calcium: 9.3 mg/dL (ref 8.7–10.3)
Chloride: 107 mmol/L — ABNORMAL HIGH (ref 96–106)
Creatinine, Ser: 2.26 mg/dL — ABNORMAL HIGH (ref 0.57–1.00)
Glucose: 117 mg/dL — ABNORMAL HIGH (ref 70–99)
Potassium: 4.7 mmol/L (ref 3.5–5.2)
Sodium: 140 mmol/L (ref 134–144)
eGFR: 22 mL/min/{1.73_m2} — ABNORMAL LOW (ref >59)

## 2021-11-02 LAB — HEMOGLOBIN A1C
Est. average glucose Bld gHb Est-mCnc: 134 mg/dL
Hgb A1c MFr Bld: 6.3 % — ABNORMAL HIGH (ref 4.8–5.6)

## 2021-11-02 NOTE — Progress Notes (Signed)
A1c remains stable at 6.3% Continue to recommend balanced, lower carb meals. Smaller meal size, adding snacks. Choosing water as drink of choice and increasing purposeful exercise.  Stable creatinine 2.2's.  Continue to avoid NSAIDs, excess alcohol and prioritize water.  I reached out to your specialist; and they should be in touch.  [name], [address] [place], [place] [address] [phone] (phone) [phone] (fax) [place]

## 2021-11-06 ENCOUNTER — Telehealth: Payer: Medicare Other | Admitting: Emergency Medicine

## 2021-11-06 ENCOUNTER — Other Ambulatory Visit: Payer: Self-pay | Admitting: Family Medicine

## 2021-11-06 ENCOUNTER — Ambulatory Visit: Payer: Self-pay

## 2021-11-06 DIAGNOSIS — R42 Dizziness and giddiness: Secondary | ICD-10-CM

## 2021-11-06 NOTE — Patient Instructions (Signed)
You should call 911 and have them break down your door and take you to the hospital.  If you are too dizzy that you cannot walk, this may be your only option.  This could be a stroke. I recommend you call 911 and go to the hospital.

## 2021-11-06 NOTE — Telephone Encounter (Signed)
  Chief Complaint: Dizziness - ongoing Symptoms: Room spinning, cannot stand Frequency: Yesterday started again - currently unable to stand Pertinent Negatives: Patient denies Vomiting Disposition: '[]'$ ED /'[]'$ Urgent Care (no appt a'[]'$ vailability in office) / Appointment(In office/virtual)/ '[x]'$  [facility]/ '[]'$ Home Care/ '[]'$ Refused Recommended Disposition /'[]'$ [facility]/ '[]'$  Follow-up with PCP Additional Notes: PT states that she has had something like this in the past, but it is much worse now.   Pt states that she cannot drive, she is unable to get down the stairs. She is unwilling to call son who is working. She states that no one else has a key. I suggested 911 - pt states that door is locked and so they will not be able to get to her. Pt just wanted medication called in. Room was spinning and she cannot stand.     Summary: Dizzy advice   Pt is calling to report that she has been dizzy since Yesterday Morning. Today unable to get out of bed. Requesting a refill of Meclizine. Do not see in chart      Reason for Disposition  [1] MODERATE dizziness (e.g., vertigo; feels very unsteady, interferes with normal activities) AND [2] has NOT been evaluated by doctor (or NP/PA) for this  Answer Assessment - Initial Assessment Questions 1. DESCRIPTION: "Describe your dizziness."     Standing at the sink room was spinning 2. VERTIGO: "Do you feel like either you or the room is spinning or tilting?"      Room was spinning 3. LIGHTHEADED: "Do you feel lightheaded?" (e.g., somewhat faint, woozy, weak upon standing)     Spinning room 4. SEVERITY: "How bad is it?"  "Can you walk?"   - MILD: Feels slightly dizzy and unsteady, but is walking normally.   - MODERATE: Feels unsteady when walking, but not falling; interferes with normal activities (e.g., school, work).   - SEVERE: Unable to walk without falling, or requires assistance to walk without falling.     severe 5. ONSET:  "When  did the dizziness begin?"     yesterday 6. AGGRAVATING FACTORS: "Does anything make it worse?" (e.g., standing, change in head position)     unknown 7. CAUSE: "What do you think is causing the dizziness?"     unsure 8. RECURRENT SYMPTOM: "Have you had dizziness before?" If Yes, ask: "When was the last time?" "What happened that time?"     Vestibular nerve issue 9. OTHER SYMPTOMS: "Do you have any other symptoms?" (e.g., headache, weakness, numbness, vomiting, earache)     no 10. PREGNANCY: "Is there any chance you are pregnant?" "When was your last menstrual period?"       na  Protocols used: Dizziness - Vertigo-A-AH

## 2021-11-06 NOTE — Progress Notes (Signed)
Virtual Visit Consent   Donna Malone, you are scheduled for a virtual visit with a [facility] provider today. Just as with appointments in the office, your consent must be obtained to participate. Your consent will be active for this visit and any virtual visit you may have with one of our providers in the next 365 days. If you have a MyChart account, a copy of this consent can be sent to you electronically.  As this is a virtual visit, video technology does not allow for your provider to perform a traditional examination. This may limit your provider's ability to fully assess your condition. If your provider identifies any concerns that need to be evaluated in person or the need to arrange testing (such as labs, EKG, etc.), we will make arrangements to do so. Although advances in technology are sophisticated, we cannot ensure that it will always work on either your end or our end. If the connection with a video visit is poor, the visit may have to be switched to a telephone visit. With either a video or telephone visit, we are not always able to ensure that we have a secure connection.  By engaging in this virtual visit, you consent to the provision of healthcare and authorize for your insurance to be billed (if applicable) for the services provided during this visit. Depending on your insurance coverage, you may receive a charge related to this service.  I need to obtain your verbal consent now. Are you willing to proceed with your visit today? Donna Malone has provided verbal consent on 11/06/2021 for a virtual visit (video or telephone). Montine Circle, PA-C  Date: 11/06/2021 11:05 AM  Virtual Visit via Video Note   I, Montine Circle, connected with  Donna Malone  ([id number], Nov 28, 1945) on 11/06/21 at 11:00 AM EDT by a video-enabled telemedicine application and verified that I am speaking with the correct person using two identifiers.  Location: Patient: Virtual Visit Location  Patient: Home Provider: Virtual Visit Location Provider: Home   I discussed the limitations of evaluation and management by telemedicine and the availability of in person appointments. The patient expressed understanding and agreed to proceed.    History of Present Illness: Donna Malone is a 76 y.o. who identifies as a female who was assigned female at birth, and is being seen today for dizziness since yesterday.  She states that she feels like the room is spinning.  She states that her symptoms improved yesterday afternoon, but then the symptoms resumed today.  She states that she feels like she would have fallen if she didn't have a table to grab on to.  She states that the dizziness worsens if she moves her head. She states that it improves if she closes her eyes.  She denies numbness, weakness, tingling.  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[place]) 06/21/2021   Depression, recurrent ([place]) 06/21/2021   Type 2 diabetes mellitus with stage 4 chronic kidney  disease, with long-term current use of insulin ([place]) 05/05/2021   Positive screening for depression on 2-item Patient Health Questionnaire (PHQ-2) 05/04/2021   Severe episode of recurrent major depressive disorder, without psychotic features ([place]) 05/04/2021   Psychophysiological insomnia 05/04/2021   Diabetes mellitus due to underlying condition with stage 4 chronic  kidney disease, with long-term current use of insulin ([place]) 05/04/2021   Hyperlipidemia associated with type 2 diabetes mellitus ([place]) 05/04/2021   Need for influenza vaccination 05/04/2021   Need for shingles vaccine 05/04/2021   Need for tetanus booster 05/04/2021   Right arm pain 10/22/2019   Degenerative arthritis of knee, bilateral 05/13/2018   Hypertension associated with diabetes ([place]) 04/01/2015   Leukocytosis 12/22/2014   Abnormal finding on mammography 09/21/2014   Obesity 09/21/2014   Chronic kidney disease, stage III (moderate) ([facility]) 09/21/2014   Accumulation of fluid in tissues 09/21/2014   Esophageal reflux 09/21/2014   Gout 09/21/2014   Hypercholesteremia 09/21/2014   Insomnia 09/21/2014   Vitamin D deficiency, unspecified 09/21/2014   Hypothyroidism 01/15/2014   Depressive disorder 01/15/2014   Low back pain 01/15/2014   Other chest pain 01/15/2014    Allergies:  Allergies  Allergen Reactions   Sulfa Antibiotics Other (See Comments)   Medications:  Current Outpatient Medications:    acetaminophen (TYLENOL) 500 MG tablet, Take 500 mg by mouth every 6 (six) hours as needed for mild pain or headache., Disp: , Rfl:    allopurinol (ZYLOPRIM) 100 MG tablet, TAKE 1 TABLET BY MOUTH EVERY DAY, Disp: 90 tablet, Rfl: 1   atorvastatin (LIPITOR) 40 MG tablet, TAKE 1 TABLET BY MOUTH EVERY DAY, Disp: 90 tablet, Rfl: 0   B Complex Vitamins (GNP VITAMIN B COMPLEX PO), Take by mouth daily. , Disp: , Rfl:    blood glucose meter kit and supplies, Dispense based on patient and insurance preference. Use once daily for FBG. (FOR ICD-10 E10.9, E11.9)., Disp: 1 each, Rfl: 0   Blood Glucose Monitoring Suppl (ONETOUCH VERIO) w/Device KIT, To check blood sugar once daily, Disp: 1 kit, Rfl: 0   buPROPion (WELLBUTRIN XL) 150 MG 24 hr tablet, TAKE 1 TABLET BY MOUTH EVERY DAY, Disp: 90 tablet, Rfl: 1   escitalopram (LEXAPRO) 20 MG tablet, TAKE 1 TABLET BY MOUTH EVERY DAY, Disp: 90 tablet, Rfl:  0   famotidine (PEPCID) 40 MG tablet, TAKE 1 TABLET BY MOUTH EVERY DAY, Disp: 90 tablet, Rfl: 3   ferrous sulfate 325 (65 FE) MG EC tablet, Take 325 mg by mouth daily., Disp: , Rfl:    glipiZIDE (GLUCOTROL XL) 10 MG 24 hr tablet, TAKE 1 TABLET (10 MG TOTAL) BY MOUTH DAILY WITH BREAKFAST., Disp: 90 tablet, Rfl: 0   KRILL OIL PO, Take by mouth., Disp: , Rfl:    Lancets (ONETOUCH ULTRASOFT) lancets, To check blood sugar once daily, Disp: 100 each, Rfl: 12   levothyroxine (SYNTHROID) 75 MCG tablet, TAKE 1 TABLET BY MOUTH EVERY DAY, Disp: 90 tablet, Rfl: 3   MAGNESIUM-OXIDE PO, Take by mouth., Disp: , Rfl:    Multiple Vitamins-Minerals (PRESERVISION AREDS 2) CAPS, Take 1 capsule by mouth in the morning and at bedtime., Disp: , Rfl:    ondansetron (ZOFRAN) 4 MG tablet, Take 1 tablet (4 mg total) by mouth every 8 (eight) hours as needed for nausea or vomiting., Disp: 30 tablet, Rfl: 1   ONETOUCH VERIO test strip, TO CHECK BLOOD SUGAR ONCE DAILY, Disp: 50 strip, Rfl: 25   Polyethyl Glycol-Propyl Glycol (SYSTANE HYDRATION PF OP), Place  1 drop into both eyes daily as needed (for dry eyes)., Disp: , Rfl:    traZODone (DESYREL) 50 MG tablet, TAKE 1 TABLET BY MOUTH EVERYDAY AT BEDTIME (Patient not taking: Reported on 11/01/2021), Disp: 90 tablet, Rfl: 1   Vitamin D, Ergocalciferol, (DRISDOL) 1.25 MG (50000 UNIT) CAPS capsule, TAKE 1 CAPSULE (50,000 UNITS TOTAL) BY MOUTH EVERY 7 (SEVEN) DAYS, Disp: 12 capsule, Rfl: 3  Observations/Objective: Telephone visit No audible distress Speech is clear and coherent with logical content.  Patient is alert and oriented at baseline.    Assessment and Plan: 1. Dizziness  - I recommend in-person evaluation at the hospital to rule out stroke.  Patient can't walk due to profound dizziness.  I advised her to call EMS, which she did, but couldn't get to the door to let them in.  I encouraged her to let them break down the door, but she declines.     Follow Up  Instructions: I discussed the assessment and treatment plan with the patient. The patient was provided an opportunity to ask questions and all were answered. The patient agreed with the plan and demonstrated an understanding of the instructions.  A copy of instructions were sent to the patient via MyChart unless otherwise noted below.     The patient was advised to call back or seek an in-person evaluation if the symptoms worsen or if the condition fails to improve as anticipated.  Time:  I spent 16 minutes with the patient via telehealth technology discussing the above problems/concerns.    Montine Circle, PA-C

## 2021-11-06 NOTE — Telephone Encounter (Signed)
Called checked on patient and reports that she is feeling a little better. States "If I starts feeling the same thing again I will go to ED"

## 2021-11-07 ENCOUNTER — Other Ambulatory Visit: Payer: Self-pay

## 2021-11-07 ENCOUNTER — Ambulatory Visit: Payer: Medicare Other | Admitting: Physician Assistant

## 2021-11-07 ENCOUNTER — Emergency Department
Admission: EM | Admit: 2021-11-07 | Discharge: 2021-11-07 | Disposition: A | Discharge status: Home / Self Care | Payer: Medicare Other | Attending: Emergency Medicine | Admitting: Emergency Medicine

## 2021-11-07 ENCOUNTER — Ambulatory Visit: Payer: Self-pay | Admitting: *Deleted

## 2021-11-07 ENCOUNTER — Emergency Department: Payer: Medicare Other

## 2021-11-07 DIAGNOSIS — R42 Dizziness and giddiness: Secondary | ICD-10-CM

## 2021-11-07 DIAGNOSIS — H81399 Other peripheral vertigo, unspecified ear: Secondary | ICD-10-CM | POA: Diagnosis not present

## 2021-11-07 DIAGNOSIS — N390 Urinary tract infection, site not specified: Secondary | ICD-10-CM

## 2021-11-07 LAB — URINALYSIS, ROUTINE W REFLEX MICROSCOPIC
Bilirubin Urine: NEGATIVE
Glucose, UA: NEGATIVE mg/dL
Hgb urine dipstick: NEGATIVE
Ketones, ur: NEGATIVE mg/dL
Nitrite: NEGATIVE
Protein, ur: NEGATIVE mg/dL
Specific Gravity, Urine: 1.01 (ref 1.005–1.030)
pH: 6 (ref 5.0–8.0)

## 2021-11-07 LAB — CBG MONITORING, ED: Glucose-Capillary: 167 mg/dL — ABNORMAL HIGH (ref 70–99)

## 2021-11-07 LAB — BASIC METABOLIC PANEL
Anion gap: 8 (ref 5–15)
BUN: 28 mg/dL — ABNORMAL HIGH (ref 8–23)
CO2: 21 mmol/L — ABNORMAL LOW (ref 22–32)
Calcium: 9 mg/dL (ref 8.9–10.3)
Chloride: 110 mmol/L (ref 98–111)
Creatinine, Ser: 2.51 mg/dL — ABNORMAL HIGH (ref 0.44–1.00)
GFR, Estimated: 19 mL/min — ABNORMAL LOW (ref >60)
Glucose, Bld: 161 mg/dL — ABNORMAL HIGH (ref 70–99)
Potassium: 4.9 mmol/L (ref 3.5–5.1)
Sodium: 139 mmol/L (ref 135–145)

## 2021-11-07 LAB — CBC
HCT: 33.4 % — ABNORMAL LOW (ref 36.0–46.0)
Hemoglobin: 10.9 g/dL — ABNORMAL LOW (ref 12.0–15.0)
MCH: 33.2 pg (ref 26.0–34.0)
MCHC: 32.6 g/dL (ref 30.0–36.0)
MCV: 101.8 fL — ABNORMAL HIGH (ref 80.0–100.0)
Platelets: 208 10*3/uL (ref 150–400)
RBC: 3.28 MIL/uL — ABNORMAL LOW (ref 3.87–5.11)
RDW: 12.9 % (ref 11.5–15.5)
WBC: 8.8 10*3/uL (ref 4.0–10.5)
nRBC: 0 % (ref 0.0–0.2)

## 2021-11-07 MED ORDER — MECLIZINE HCL 25 MG PO TABS
25.0000 mg | ORAL_TABLET | Freq: Once | ORAL | Status: AC
  Administered 2021-11-07: 25 mg via ORAL
  Filled 2021-11-07: qty 1

## 2021-11-07 MED ORDER — MECLIZINE HCL 25 MG PO TABS: 25.0000 mg | ORAL_TABLET | Freq: Three times a day (TID) | ORAL | 0 refills | Status: DC | PRN

## 2021-11-07 MED ORDER — CEFDINIR 300 MG PO CAPS: 300.0000 mg | ORAL_CAPSULE | Freq: Two times a day (BID) | ORAL | 0 refills | Status: AC

## 2021-11-07 NOTE — ED Notes (Signed)
Dc ppw provided. Pt provides verbal consent for d c

## 2021-11-07 NOTE — Telephone Encounter (Signed)
Reason for Disposition  [1] SEVERE back pain (e.g., excruciating) AND [2] sudden onset AND [3] age > 60 years    Chest pain too  Answer Assessment - Initial Assessment Questions 1. ONSET: "When did the pain begin?"      Having pain in right side and back.   Hard to turn over in bed it's very painful. Started 3 days ago.   It wasn't bad to start with but getting worse.   I am having dizziness too.    The room was spinning yesterday real bad.   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    I had to grab the sink to keep from falling while dizzy yesterday.     I felt hot during the dizzy spell.   Sometimes I'm short of breath with the pain.   3 days ago I had tightness in my chest it lasted 3-4 minutes.    It spread into my upper back.    No more chest pain since 3 days ago.   No urinary symptoms when asked.    I'm still dizzy this morning but not as bad as yesterday.   Yesterday was bad I was so dizzy I could not get to the door to let my neighbor in.   I just laid around yesterday.   I could not do anything. 2. LOCATION: "Where does it hurt?" (upper, mid or lower back)     Pain in right side and lower-middle back. 3. SEVERITY: "How bad is the pain?"  (e.g., Scale 1-10; mild, moderate, or severe)   - MILD (1-3): Doesn't interfere with normal activities.    - MODERATE (4-7): Interferes with normal activities or awakens from sleep.    - SEVERE (8-10): Excruciating pain, unable to do any normal activities.      Moderate.   I can't turn over in bed because it's so painful. 4. PATTERN: "Is the pain constant?" (e.g., yes, no; constant, intermittent)      No  worse with movement 5. RADIATION: "Does the pain shoot into your legs or somewhere else?"     The chest pain radiates into my upper back.    6. CAUSE:  "What do you think is causing the back pain?"      I don't know 7. BACK OVERUSE:  "Any recent lifting of heavy objects, strenuous work or  exercise?"     No 8. MEDICINES: "What have you taken so far for the pain?" (e.g., nothing, acetaminophen, NSAIDS)     No 9. NEUROLOGIC SYMPTOMS: "Do you have any weakness, numbness, or problems with bowel/bladder control?"     No 10. OTHER SYMPTOMS: "Do you have any other symptoms?" (e.g., fever, abdomen pain, burning with urination, blood in urine)       Denies urinary symptoms 11. PREGNANCY: "Is there any chance you are pregnant?" "When was your last menstrual period?"       N/A  Protocols used: Back Pain-A-AH

## 2021-11-07 NOTE — ED Triage Notes (Signed)
Pt states she had a few dizzy spells on Sunday. She continued to have them leading up to today.  Sunday evening pt began having a pain in her side pt states she began having shortness of breath today.

## 2021-11-07 NOTE — ED Provider Notes (Addendum)
[facility] Provider Note   Event Date/Time   First MD Initiated Contact with Patient 11/07/21 1437     (approximate) History  Shortness of Breath and Dizziness  HPI Donna Malone is a 76 y.o. female with a stated past medical history of vertigo who presents for intermittent vertigo symptoms over the last 2 days.  Patient states that this began while she was laying in bed with a right-sided headache and when she went to sit up she noticed that the room was spinning around her and she was unable to catch her balance.  Patient states that this is similar to vertigo symptoms that she has had in the past and was diagnosed with labyrinthitis on the right side.  Patient denies any recent head trauma, loss of consciousness ROS: Patient currently denies any vision changes, tinnitus, difficulty speaking, facial droop, sore throat, chest pain, shortness of breath, abdominal pain, nausea/vomiting/diarrhea, dysuria, or weakness/numbness/paresthesias in any extremity   Physical Exam  Triage Vital Signs: ED Triage Vitals  Enc Vitals Group     BP 11/07/21 1241 114/75     Pulse Rate 11/07/21 1241 78     Resp 11/07/21 1241 17     Temp 11/07/21 1241 97.8 F (36.6 C)     Temp Source 11/07/21 1241 Oral     SpO2 11/07/21 1240 97 %     Weight 11/07/21 1242 175 lb (79.4 kg)     Height 11/07/21 1242 '5\' 3"'$  (1.6 m)     Head Circumference --      Peak Flow --      Pain Score 11/07/21 1241 8     Pain Loc --      Pain Edu? --      Excl. in [place]? --    Most recent vital signs: Vitals:   11/07/21 1645 11/07/21 1700  BP:  (!) 145/79  Pulse: 64   Resp: 15 16  Temp:    SpO2: 97%    General: Awake, oriented x4. CV:  Good peripheral perfusion.  Resp:  Normal effort.  Abd:  No distention.  Other:  Elderly Caucasian overweight female sitting in bed in no acute distress.  HINNTS exam consistent with peripheral vertigo ED Results / Procedures / Treatments  Labs (all labs ordered  are listed, but only abnormal results are displayed) Labs Reviewed  BASIC METABOLIC PANEL - Abnormal; Notable for the following components:      Result Value   CO2 21 (*)    Glucose, Bld 161 (*)    BUN 28 (*)    Creatinine, Ser 2.51 (*)    GFR, Estimated 19 (*)    All other components within normal limits  CBC - Abnormal; Notable for the following components:   RBC 3.28 (*)    Hemoglobin 10.9 (*)    HCT 33.4 (*)    MCV 101.8 (*)    All other components within normal limits  CBG MONITORING, ED - Abnormal; Notable for the following components:   Glucose-Capillary 167 (*)    All other components within normal limits  URINALYSIS, ROUTINE W REFLEX MICROSCOPIC   EKG ED ECG REPORT I, Naaman Plummer, the attending physician, personally viewed and interpreted this ECG. Date: 11/07/2021 EKG Time: 1509 Rate: 62 Rhythm: normal sinus rhythm QRS Axis: normal Intervals: normal ST/T Wave abnormalities: normal Narrative Interpretation: no evidence of acute ischemia RADIOLOGY ED MD interpretation: CT of the head without contrast interpreted by me shows no evidence of acute  abnormalities including no intracerebral hemorrhage, obvious masses, or significant edema -Agree with radiology assessment Official radiology report(s): CT HEAD WO CONTRAST (5MM)  Result Date: 11/07/2021 CLINICAL DATA:  Altered mental status, nontraumatic (Ped 0-17y) EXAM: CT HEAD WITHOUT CONTRAST TECHNIQUE: Contiguous axial images were obtained from the base of the skull through the vertex without intravenous contrast. RADIATION DOSE REDUCTION: This exam was performed according to the departmental dose-optimization program which includes automated exposure control, adjustment of the mA and/or kV according to patient size and/or use of iterative reconstruction technique. COMPARISON:  Head CT 02/05/2011 FINDINGS: Brain: No evidence of acute intracranial hemorrhage or extra-axial collection.Patent basal cisterns. No concerning  mass effect.The ventricles are normal in size. Small intraventricular fat lobule in the posterior horn of the left lateral ventricle.Scattered subcortical and periventricular white matter hypodensities, nonspecific but likely sequela of chronic small vessel ischemic disease.Mild cerebral atrophy Vascular: No hyperdense vessel. Skull: Negative for skull fracture. Sinuses/Orbits: No acute finding. Other: None. IMPRESSION: No acute intracranial abnormality. 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 Electronically Signed   By: Maurine Simmering M.D.   On: 11/07/2021 14:10   PROCEDURES: Critical Care performed: No .1-3 Lead EKG Interpretation  Performed by: Naaman Plummer, MD Authorized by: Naaman Plummer, MD     Interpretation: normal     ECG rate:  65   ECG rate assessment: normal     Rhythm: sinus rhythm     Ectopy: none     Conduction: normal    MEDICATIONS ORDERED IN ED: Medications  meclizine (ANTIVERT) tablet 25 mg (has no administration in time range)   IMPRESSION / MDM / ASSESSMENT AND PLAN / ED COURSE  I reviewed the triage vital signs and the nursing notes.                             The patient is on the cardiac monitor to evaluate for evidence of arrhythmia and/or significant heart rate changes. Patient's presentation is most consistent with acute presentation with potential threat to life or bodily function. Based on History, Exam, and Findings, presentation not consistent with syncope, seizure, stroke, meningitis, symptomatic anemia (gastrointestinal bleed), Increased ICP (cerebral tumor/mass), ICH. Additionally, I have a low suspicion for AOM, labyrinthitis, or other infectious process. Tx: meclizine Reassessment: Prior to discharge symptoms controlled, patient well appearing. Disposition:  Discharge. Strict return precautions discussed w/ full understanding. Advise follow up with primary care provider within 24-48 hours.   FINAL CLINICAL  IMPRESSION(S) / ED DIAGNOSES   Final diagnoses:  Vertigo  Lightheadedness   Rx / DC Orders   ED Discharge Orders          Ordered    meclizine (ANTIVERT) 25 MG tablet  3 times daily PRN        11/07/21 1712           Note:  This document was prepared using Dragon voice recognition software and may include unintentional dictation errors.   Naaman Plummer, MD 11/07/21 1712    Naaman Plummer, MD 11/07/21 [phone]

## 2021-11-07 NOTE — ED Notes (Signed)
Orthostatic vitals Laying  HR 65 O2 98% RR 18 BP 83/74 (79)  Sitting HR 70 O2 97% BP 121/73 (88)  Standing HR 76 BP 100/69 (80 O2 99%  Laying  BP 151/82 HR 64

## 2021-11-07 NOTE — Telephone Encounter (Signed)
While triaging pt for right side and right lower back pain she mentioned having chest pain that radiated into her upper back 3 days ago. It lasted about 3-4 minutes. Has not happened since.   Her main complaint is dizziness which was bad yesterday.   Feels lightheaded and weak today but not dizzy and the right sided pain and lower right back pain.    Pt was referred to the ED and was agreeable to going.

## 2021-11-07 NOTE — Telephone Encounter (Addendum)
  Chief Complaint: back pain and chest pain Symptoms: see notes     back pain and dizziness and chest pain Frequency: Yesterday and today Pertinent Negatives: Patient denies N/A Disposition: '[x]'$ ED /'[]'$ Urgent Care (no appt availability in office) / '[]'$ Appointment(In office/virtual)/ '[]'$  [place] [facility]/ '[]'$ Home Care/ '[]'$ Refused Recommended Disposition /'[]'$ [place] [facility]/ '[]'$  Follow-up with PCP Additional Notes: Going to ED

## 2021-11-13 ENCOUNTER — Other Ambulatory Visit: Payer: Self-pay | Admitting: Family Medicine

## 2021-11-13 DIAGNOSIS — F3342 Major depressive disorder, recurrent, in full remission: Secondary | ICD-10-CM

## 2021-11-28 ENCOUNTER — Other Ambulatory Visit: Payer: Self-pay | Admitting: Family Medicine

## 2021-11-28 DIAGNOSIS — E7800 Pure hypercholesterolemia, unspecified: Secondary | ICD-10-CM

## 2021-11-28 MED ORDER — ATORVASTATIN CALCIUM 40 MG PO TABS: 40.0000 mg | ORAL_TABLET | Freq: Every day | ORAL | 0 refills | Status: DC

## 2021-11-28 NOTE — Telephone Encounter (Signed)
Requested medication (s) are due for refill today - yes  Requested medication (s) are on the active medication list -yes  Future visit scheduled -no  Last refill: 11/07/21 #30  Notes to clinic: non delegated Rx, outside provider  Requested Prescriptions  Pending Prescriptions Disp Refills   meclizine (ANTIVERT) 25 MG tablet 30 tablet 0    Sig: Take 1-2 tablets (25-50 mg total) by mouth 3 (three) times daily as needed for dizziness.     Not Delegated - Gastroenterology: Antiemetics Failed - 11/28/2021 10:27 AM      Failed - This refill cannot be delegated      Passed - Valid encounter within last 6 months    Recent Outpatient Visits           3 weeks ago Type 2 diabetes mellitus with stage 4 chronic kidney disease, with long-term current use of insulin [facility])   [facility] Tally Joe T, [name]   3 months ago Type 2 diabetes mellitus with stage 3b chronic kidney disease, without long-term current use of insulin [facility])   [facility] Tally Joe T, [name]   5 months ago Depression, recurrent [facility])   [facility] T, [name]   6 months ago Hypertension associated with diabetes [facility])   [facility] Tally Joe T, [name]   1 year ago Type 2 diabetes mellitus with stage 3b chronic kidney disease, without long-term current use of insulin ([place])   [facility], Dionne Bucy, MD              Signed Prescriptions Disp Refills   atorvastatin (LIPITOR) 40 MG tablet 90 tablet 0    Sig: Take 1 tablet (40 mg total) by mouth daily.     Cardiovascular:  Antilipid - Statins Failed - 11/28/2021 10:27 AM      Failed - Lipid Panel in normal range within the last 12 months    Cholesterol, Total  Date Value Ref Range Status  05/04/2021 204 (H) 100 - 199 mg/dL Final   LDL Chol Calc (NIH)  Date Value Ref Range Status  05/04/2021 91 0 - 99 mg/dL Final   HDL  Date Value Ref Range Status  05/04/2021 33 (L) >39 mg/dL  Final   Triglycerides  Date Value Ref Range Status  05/04/2021 487 (H) 0 - 149 mg/dL Final         Passed - Patient is not pregnant      Passed - Valid encounter within last 12 months    Recent Outpatient Visits           3 weeks ago Type 2 diabetes mellitus with stage 4 chronic kidney disease, with long-term current use of insulin [facility])   [facility] Tally Joe T, [name]   3 months ago Type 2 diabetes mellitus with stage 3b chronic kidney disease, without long-term current use of insulin [facility])   [facility] Tally Joe T, [name]   5 months ago Depression, recurrent [facility])   [facility] Tally Joe T, [name]   6 months ago Hypertension associated with diabetes [facility])   [facility] Tally Joe T, [name]   1 year ago Type 2 diabetes mellitus with stage 3b chronic kidney disease, without long-term current use of insulin [facility])   [place], Dionne Bucy, MD                 Requested Prescriptions  Pending Prescriptions Disp Refills  meclizine (ANTIVERT) 25 MG tablet 30 tablet 0    Sig: Take 1-2 tablets (25-50 mg total) by mouth 3 (three) times daily as needed for dizziness.     Not Delegated - Gastroenterology: Antiemetics Failed - 11/28/2021 10:27 AM      Failed - This refill cannot be delegated      Passed - Valid encounter within last 6 months    Recent Outpatient Visits           3 weeks ago Type 2 diabetes mellitus with stage 4 chronic kidney disease, with long-term current use of insulin [facility])   [facility] Tally Joe T, [name]   3 months ago Type 2 diabetes mellitus with stage 3b chronic kidney disease, without long-term current use of insulin [facility])   [facility] T[facility] Joe T, [name]   5 months ago Depression, recurrent [facility])   [facility] Tally Joe T, [name]   6 months ago Hypertension associated with diabetes [facility])   [facility]  Tally Joe T, [name]   1 year ago Type 2 diabetes mellitus with stage 3b chronic kidney disease, without long-term current use of insulin ([place])   [facility], Dionne Bucy, MD              Signed Prescriptions Disp Refills   atorvastatin (LIPITOR) 40 MG tablet 90 tablet 0    Sig: Take 1 tablet (40 mg total) by mouth daily.     Cardiovascular:  Antilipid - Statins Failed - 11/28/2021 10:27 AM      Failed - Lipid Panel in normal range within the last 12 months    Cholesterol, Total  Date Value Ref Range Status  05/04/2021 204 (H) 100 - 199 mg/dL Final   LDL Chol Calc (NIH)  Date Value Ref Range Status  05/04/2021 91 0 - 99 mg/dL Final   HDL  Date Value Ref Range Status  05/04/2021 33 (L) >39 mg/dL Final   Triglycerides  Date Value Ref Range Status  05/04/2021 487 (H) 0 - 149 mg/dL Final         Passed - Patient is not pregnant      Passed - Valid encounter within last 12 months    Recent Outpatient Visits           3 weeks ago Type 2 diabetes mellitus with stage 4 chronic kidney disease, with long-term current use of insulin [facility])   [facility] Tally Joe T, [name]   3 months ago Type 2 diabetes mellitus with stage 3b chronic kidney disease, without long-term current use of insulin [facility])   [facility] T[facility] Joe T, [name]   5 months ago Depression, recurrent [facility])   [facility] Tally Joe T, [name]   6 months ago Hypertension associated with diabetes [facility])   [facility] Tally Joe T, [name]   1 year ago Type 2 diabetes mellitus with stage 3b chronic kidney disease, without long-term current use of insulin [facility])   [facility], Dionne Bucy, MD

## 2021-11-28 NOTE — Telephone Encounter (Signed)
Requested Prescriptions  Pending Prescriptions Disp Refills  . atorvastatin (LIPITOR) 40 MG tablet 90 tablet 0    Sig: Take 1 tablet (40 mg total) by mouth daily.     Cardiovascular:  Antilipid - Statins Failed - 11/28/2021 10:27 AM      Failed - Lipid Panel in normal range within the last 12 months    Cholesterol, Total  Date Value Ref Range Status  05/04/2021 204 (H) 100 - 199 mg/dL Final   LDL Chol Calc (NIH)  Date Value Ref Range Status  05/04/2021 91 0 - 99 mg/dL Final   HDL  Date Value Ref Range Status  05/04/2021 33 (L) >39 mg/dL Final   Triglycerides  Date Value Ref Range Status  05/04/2021 487 (H) 0 - 149 mg/dL Final         Passed - Patient is not pregnant      Passed - Valid encounter within last 12 months    Recent Outpatient Visits          3 weeks ago Type 2 diabetes mellitus with stage 4 chronic kidney disease, with long-term current use of insulin [facility])   [facility] Tally Joe T, [name]   3 months ago Type 2 diabetes mellitus with stage 3b chronic kidney disease, without long-term current use of insulin [facility])   [facility] Tally Joe T, [name]   5 months ago Depression, recurrent [facility])   [facility] Tally Joe T, [name]   6 months ago Hypertension associated with diabetes [facility]   [facility] Tally Joe T, [name]   1 year ago Type 2 diabetes mellitus with stage 3b chronic kidney disease, without long-term current use of insulin ([place])   [facility] [place], Dionne Bucy, MD             . meclizine (ANTIVERT) 25 MG tablet 30 tablet 0    Sig: Take 1-2 tablets (25-50 mg total) by mouth 3 (three) times daily as needed for dizziness.     Not Delegated - Gastroenterology: Antiemetics Failed - 11/28/2021 10:27 AM      Failed - This refill cannot be delegated      Passed - Valid encounter within last 6 months    Recent Outpatient Visits          3 weeks ago Type 2 diabetes mellitus with  stage 4 chronic kidney disease, with long-term current use of insulin [facility])   [facility] T[facility] Joe T, [name]   3 months ago Type 2 diabetes mellitus with stage 3b chronic kidney disease, without long-term current use of insulin [facility])   [facility] T[facility] T, [name]   5 months ago Depression, recurrent [facility])   [facility] T[facility] T, [name]   6 months ago Hypertension associated with diabetes [facility])   [facility] T[facility] T, [name]   1 year ago Type 2 diabetes mellitus with stage 3b chronic kidney disease, without long-term current use of insulin [facility])   [facility], Dionne Bucy, MD

## 2021-11-28 NOTE — Telephone Encounter (Signed)
Medication Refill - Medication: atorvastatin (LIPITOR) 40 MG tablet, meclizine (ANTIVERT) 25 MG tablet  Has the patient contacted their pharmacy? Yes.     Preferred Pharmacy (with phone number or street name):  [id number] [place] [place] [address]:  [phone] Fax:  [phone]     Has the patient been seen for an appointment in the last year OR does the patient have an upcoming appointment? Yes.    Patient is going on a cruise on September 16th and needs the meclizine for motion sickness

## 2021-11-29 MED ORDER — MECLIZINE HCL 25 MG PO TABS
25.0000 mg | ORAL_TABLET | Freq: Three times a day (TID) | ORAL | 0 refills | Status: AC | PRN
Start: 2021-11-29 — End: ?

## 2021-12-02 ENCOUNTER — Other Ambulatory Visit: Payer: Self-pay | Admitting: Family Medicine

## 2021-12-02 DIAGNOSIS — Z794 Long term (current) use of insulin: Secondary | ICD-10-CM

## 2021-12-02 DIAGNOSIS — E1122 Type 2 diabetes mellitus with diabetic chronic kidney disease: Secondary | ICD-10-CM

## 2021-12-10 ENCOUNTER — Other Ambulatory Visit: Payer: Self-pay | Admitting: Family Medicine

## 2021-12-10 DIAGNOSIS — F3342 Major depressive disorder, recurrent, in full remission: Secondary | ICD-10-CM

## 2021-12-10 DIAGNOSIS — M109 Gout, unspecified: Secondary | ICD-10-CM

## 2021-12-29 ENCOUNTER — Inpatient Hospital Stay: Payer: Medicare Other

## 2021-12-29 ENCOUNTER — Telehealth: Payer: Medicare Other | Admitting: Emergency Medicine

## 2021-12-29 ENCOUNTER — Ambulatory Visit: Payer: Self-pay

## 2021-12-29 DIAGNOSIS — U071 COVID-19: Secondary | ICD-10-CM

## 2021-12-29 MED ORDER — MOLNUPIRAVIR EUA 200MG CAPSULE: 4.0000 | ORAL_CAPSULE | Freq: Two times a day (BID) | ORAL | 0 refills | Status: AC

## 2021-12-29 MED ORDER — BENZONATATE 100 MG PO CAPS: 100.0000 mg | ORAL_CAPSULE | Freq: Two times a day (BID) | ORAL | 0 refills | Status: DC | PRN

## 2021-12-29 NOTE — Progress Notes (Signed)
Virtual Visit Consent   Donna Malone, you are scheduled for a virtual visit with a [facility] provider today. Just as with appointments in the office, your consent must be obtained to participate. Your consent will be active for this visit and any virtual visit you may have with one of our providers in the next 365 days. If you have a MyChart account, a copy of this consent can be sent to you electronically.  As this is a virtual visit, video technology does not allow for your provider to perform a traditional examination. This may limit your provider's ability to fully assess your condition. If your provider identifies any concerns that need to be evaluated in person or the need to arrange testing (such as labs, EKG, etc.), we will make arrangements to do so. Although advances in technology are sophisticated, we cannot ensure that it will always work on either your end or our end. If the connection with a video visit is poor, the visit may have to be switched to a telephone visit. With either a video or telephone visit, we are not always able to ensure that we have a secure connection.  By engaging in this virtual visit, you consent to the provision of healthcare and authorize for your insurance to be billed (if applicable) for the services provided during this visit. Depending on your insurance coverage, you may receive a charge related to this service.  I need to obtain your verbal consent now. Are you willing to proceed with your visit today? Donna Malone has provided verbal consent on 12/29/2021 for a virtual visit (video or telephone). Donna Circle, PA-C  Date: 12/29/2021 11:15 AM  Virtual Visit via Video Note   I, Donna Malone, connected with  Donna Malone  ([id number], 1945-04-09) on 12/29/21 at 11:15 AM EDT by a video-enabled telemedicine application and verified that I am speaking with the correct person using two identifiers.  Location: Patient: Virtual Visit  Location Patient: Home Provider: Virtual Visit Location Provider: Home   I discussed the limitations of evaluation and management by telemedicine and the availability of in person appointments. The patient expressed understanding and agreed to proceed.    History of Present Illness: Donna Malone is a 76 y.o. who identifies as a female who was assigned female at birth, and is being seen today for COVID 40.  States that she had a positive home test yesterday.  Reports chills, subjective fevers, cough, myalgias and diarrhea.  Has tried taking vitamin C and zinc.  She states that she has hx of kidney disease.  Has tried taking Coricidin for cough.  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) ([place]) 06/21/2021   Depression, recurrent ([place]) 06/21/2021   Type 2 diabetes mellitus with stage 4 chronic kidney disease, with long-term current use of insulin ([place]) 05/05/2021   Positive screening for depression on 2-item Patient Health Questionnaire (PHQ-2) 05/04/2021   Severe episode of recurrent major depressive disorder, without  psychotic features ([place]) 05/04/2021   Psychophysiological insomnia 05/04/2021   Diabetes mellitus due to underlying condition with stage 4 chronic kidney disease, with long-term current use of insulin ([place]) 05/04/2021   Hyperlipidemia associated with type 2 diabetes mellitus ([place]) 05/04/2021   Need for influenza  vaccination 05/04/2021   Need for shingles vaccine 05/04/2021   Need for tetanus booster 05/04/2021   Right arm pain 10/22/2019   Degenerative arthritis of knee, bilateral 05/13/2018   Hypertension associated with diabetes ([place]) 04/01/2015   Leukocytosis 12/22/2014   Abnormal finding on mammography 09/21/2014   Obesity 09/21/2014   Chronic kidney disease, stage III (moderate) (HCC) 09/21/2014   Accumulation of fluid in tissues 09/21/2014   Esophageal reflux 09/21/2014   Gout 09/21/2014   Hypercholesteremia 09/21/2014   Insomnia 09/21/2014   Vitamin D deficiency, unspecified 09/21/2014   Hypothyroidism 01/15/2014   Depressive disorder 01/15/2014   Low back pain 01/15/2014   Other chest pain 01/15/2014    Allergies:  Allergies  Allergen Reactions   Sulfa Antibiotics Other (See Comments)   Medications:  Current Outpatient Medications:    acetaminophen (TYLENOL) 500 MG tablet, Take 500 mg by mouth every 6 (six) hours as needed for mild pain or headache., Disp: , Rfl:    allopurinol (ZYLOPRIM) 100 MG tablet, TAKE 1 TABLET BY MOUTH EVERY DAY, Disp: 90 tablet, Rfl: 1   atorvastatin (LIPITOR) 40 MG tablet, Take 1 tablet (40 mg total) by mouth daily., Disp: 90 tablet, Rfl: 0   B Complex Vitamins (GNP VITAMIN B COMPLEX PO), Take by mouth daily. , Disp: , Rfl:    blood glucose meter kit and supplies, Dispense based on patient and insurance preference. Use once daily for FBG. (FOR ICD-10 E10.9, E11.9)., Disp: 1 each, Rfl: 0   Blood Glucose Monitoring Suppl (ONETOUCH VERIO) w/Device KIT, To check blood sugar once daily, Disp: 1 kit, Rfl: 0   buPROPion (WELLBUTRIN XL) 150 MG 24 hr tablet, TAKE 1 TABLET BY MOUTH EVERY DAY, Disp: 90 tablet, Rfl: 1   escitalopram (LEXAPRO) 20 MG tablet, TAKE 1 TABLET BY MOUTH EVERY DAY, Disp: 90 tablet, Rfl: 0   famotidine (PEPCID) 40 MG tablet, TAKE 1 TABLET BY MOUTH EVERY DAY, Disp: 90 tablet, Rfl: 3   ferrous sulfate 325 (65 FE) MG EC tablet, Take 325 mg by  mouth daily., Disp: , Rfl:    glipiZIDE (GLUCOTROL XL) 10 MG 24 hr tablet, TAKE 1 TABLET (10 MG TOTAL) BY MOUTH DAILY WITH BREAKFAST., Disp: 90 tablet, Rfl: 0   KRILL OIL PO, Take by mouth., Disp: , Rfl:    Lancets (ONETOUCH ULTRASOFT) lancets, To check blood sugar once daily, Disp: 100 each, Rfl: 12   levothyroxine (SYNTHROID) 75 MCG tablet, TAKE 1 TABLET BY MOUTH EVERY DAY, Disp: 90 tablet, Rfl: 3   MAGNESIUM-OXIDE PO, Take by mouth., Disp: , Rfl:    meclizine (ANTIVERT) 25 MG tablet, Take 1-2 tablets (25-50 mg total) by mouth 3 (three) times daily as needed for dizziness., Disp: 30 tablet, Rfl: 0   Multiple Vitamins-Minerals (PRESERVISION AREDS 2) CAPS, Take 1 capsule by mouth in the morning and at bedtime., Disp: , Rfl:    ondansetron (ZOFRAN) 4 MG tablet, Take 1 tablet (4 mg total) by mouth every 8 (eight) hours as needed for nausea or vomiting., Disp: 30 tablet, Rfl: 1   ONETOUCH VERIO test strip, TO CHECK BLOOD SUGAR ONCE DAILY, Disp: 50 strip, Rfl: 25   Polyethyl Glycol-Propyl Glycol (SYSTANE HYDRATION PF OP), Place 1 drop  into both eyes daily as needed (for dry eyes)., Disp: , Rfl:    traZODone (DESYREL) 50 MG tablet, TAKE 1 TABLET BY MOUTH EVERYDAY AT BEDTIME (Patient not taking: Reported on 11/01/2021), Disp: 90 tablet, Rfl: 1   Vitamin D, Ergocalciferol, (DRISDOL) 1.25 MG (50000 UNIT) CAPS capsule, TAKE 1 CAPSULE (50,000 UNITS TOTAL) BY MOUTH EVERY 7 (SEVEN) DAYS, Disp: 12 capsule, Rfl: 3  Observations/Objective: Patient is well-developed, well-nourished in no acute distress.  Resting comfortably  at home.  Head is normocephalic, atraumatic.  No labored breathing.  Speech is clear and coherent with logical content.  Patient is alert and oriented at baseline.    Assessment and Plan: 1. COVID-19  -Molnupiravir (hx of CKD) -Tessalon -PCP followup if not improving  Follow Up Instructions: I discussed the assessment and treatment plan with the patient. The patient was provided an  opportunity to ask questions and all were answered. The patient agreed with the plan and demonstrated an understanding of the instructions.  A copy of instructions were sent to the patient via MyChart unless otherwise noted below.     The patient was advised to call back or seek an in-person evaluation if the symptoms worsen or if the condition fails to improve as anticipated.  Time:  I spent 11 minutes with the patient via telehealth technology discussing the above problems/concerns.    Donna Circle, PA-C

## 2021-12-29 NOTE — Telephone Encounter (Signed)
  Chief Complaint: COVID+ Symptoms: Fatigue, BA, cough, congestion, can't take a deep breath Frequency: Last weds Pertinent Negatives: Patient denies Fever,  Disposition: '[]'$ ED /'[]'$ Urgent Care (no appt availability in office) / '[]'$ Appointment(In office/virtual)/ '[x]'$  [place] [facility]/ '[]'$ Home Care/ '[]'$ Refused Recommended Disposition /'[]'$ [place] [facility]/ '[]'$  Follow-up with PCP Additional Notes: Pt just returned from trip abroad and tested + for COVID yesterday. Pt is feeling a little better today. Pt has several chronic conditions.   Summary: cold and flu like symptoms / rx req   The patient has tested positive for COVID 19 via at home test yesterday 12/28/21   The patient is experiencing cough, congestion, soreness and stomach discomfort   The patient would like to be prescribed something for their discomfort   Please contact the patient further when possible      Reason for Disposition  [1] HIGH RISK patient (e.g., weak immune system, age > 16 years, obesity with BMI 30 or higher, pregnant, chronic lung disease or other chronic medical condition) AND [2] COVID symptoms (e.g., cough, fever)  (Exceptions: Already seen by PCP and no new or worsening symptoms.)  Answer Assessment - Initial Assessment Questions 1. COVID-19 DIAGNOSIS: "How do you know that you have COVID?" (e.g., positive lab test or self-test, diagnosed by doctor or NP/PA, symptoms after exposure).     Home test 2. COVID-19 EXPOSURE: "Was there any known exposure to COVID before the symptoms began?" CDC Definition of close contact: within 6 feet (2 meters) for a total of 15 minutes or more over a 24-hour period.      No - Just got back form [place] 3. ONSET: "When did the COVID-19 symptoms start?"      Last weds 4. WORST SYMPTOM: "What is your worst symptom?" (e.g., cough, fever, shortness of breath, muscle aches)     Fatigue, BA 5. COUGH: "Do you have a cough?" If Yes, ask: "How bad is the cough?"       Cough -  coughing up phlegm 6. FEVER: "Do you have a fever?" If Yes, ask: "What is your temperature, how was it measured, and when did it start?"     no 7. RESPIRATORY STATUS: "Describe your breathing?" (e.g., normal; shortness of breath, wheezing, unable to speak)      sometimes 8. BETTER-SAME-WORSE: "Are you getting better, staying the same or getting worse compared to yesterday?"  If getting worse, ask, "In what way?"     A little better 9. OTHER SYMPTOMS: "Do you have any other symptoms?"  (e.g., chills, fatigue, headache, loss of smell or taste, muscle pain, sore throat)     HA, BA 10. HIGH RISK DISEASE: "Do you have any chronic medical problems?" (e.g., asthma, heart or lung disease, weak immune system, obesity, etc.)       Diabetes, Stage 4 kidney, myloma 11. VACCINE: "Have you had the COVID-19 vaccine?" If Yes, ask: "Which one, how many shots, when did you get it?"        12. PREGNANCY: "Is there any chance you are pregnant?" "When was your last menstrual period?"        13. O2 SATURATION MONITOR:  "Do you use an oxygen saturation monitor (pulse oximeter) at home?" If Yes, ask "What is your reading (oxygen level) today?" "What is your usual oxygen saturation reading?" (e.g., 95%)  Protocols used: Coronavirus (COVID-19) Diagnosed or Suspected-A-AH

## 2021-12-29 NOTE — Telephone Encounter (Signed)
Patient advised. Verbalized understanding 

## 2022-01-01 ENCOUNTER — Encounter: Payer: Medicare Other | Admitting: Student

## 2022-01-04 ENCOUNTER — Inpatient Hospital Stay: Payer: Medicare Other | Attending: Oncology

## 2022-01-04 DIAGNOSIS — N184 Chronic kidney disease, stage 4 (severe): Secondary | ICD-10-CM | POA: Diagnosis not present

## 2022-01-04 DIAGNOSIS — I129 Hypertensive chronic kidney disease with stage 1 through stage 4 chronic kidney disease, or unspecified chronic kidney disease: Secondary | ICD-10-CM | POA: Insufficient documentation

## 2022-01-04 DIAGNOSIS — Z809 Family history of malignant neoplasm, unspecified: Secondary | ICD-10-CM | POA: Diagnosis not present

## 2022-01-04 DIAGNOSIS — E1122 Type 2 diabetes mellitus with diabetic chronic kidney disease: Secondary | ICD-10-CM | POA: Diagnosis not present

## 2022-01-04 DIAGNOSIS — C9000 Multiple myeloma not having achieved remission: Secondary | ICD-10-CM | POA: Diagnosis present

## 2022-01-04 DIAGNOSIS — D472 Monoclonal gammopathy: Secondary | ICD-10-CM

## 2022-01-04 DIAGNOSIS — Z90710 Acquired absence of both cervix and uterus: Secondary | ICD-10-CM | POA: Insufficient documentation

## 2022-01-04 DIAGNOSIS — D72820 Lymphocytosis (symptomatic): Secondary | ICD-10-CM | POA: Diagnosis not present

## 2022-01-04 LAB — COMPREHENSIVE METABOLIC PANEL
ALT: 17 U/L (ref 0–44)
AST: 21 U/L (ref 15–41)
Albumin: 3.5 g/dL (ref 3.5–5.0)
Alkaline Phosphatase: 118 U/L (ref 38–126)
Anion gap: 5 (ref 5–15)
BUN: 40 mg/dL — ABNORMAL HIGH (ref 8–23)
CO2: 23 mmol/L (ref 22–32)
Calcium: 9 mg/dL (ref 8.9–10.3)
Chloride: 108 mmol/L (ref 98–111)
Creatinine, Ser: 2.51 mg/dL — ABNORMAL HIGH (ref 0.44–1.00)
GFR, Estimated: 19 mL/min — ABNORMAL LOW (ref >60)
Glucose, Bld: 157 mg/dL — ABNORMAL HIGH (ref 70–99)
Potassium: 5 mmol/L (ref 3.5–5.1)
Sodium: 136 mmol/L (ref 135–145)
Total Bilirubin: 0.5 mg/dL (ref 0.3–1.2)
Total Protein: 6.8 g/dL (ref 6.5–8.1)

## 2022-01-04 LAB — CBC WITH DIFFERENTIAL/PLATELET
Abs Immature Granulocytes: 0.05 10*3/uL (ref 0.00–0.07)
Basophils Absolute: 0.1 10*3/uL (ref 0.0–0.1)
Basophils Relative: 1 %
Eosinophils Absolute: 0.3 10*3/uL (ref 0.0–0.5)
Eosinophils Relative: 4 %
HCT: 33.9 % — ABNORMAL LOW (ref 36.0–46.0)
Hemoglobin: 11.3 g/dL — ABNORMAL LOW (ref 12.0–15.0)
Immature Granulocytes: 1 %
Lymphocytes Relative: 28 %
Lymphs Abs: 2.5 10*3/uL (ref 0.7–4.0)
MCH: 33.5 pg (ref 26.0–34.0)
MCHC: 33.3 g/dL (ref 30.0–36.0)
MCV: 100.6 fL — ABNORMAL HIGH (ref 80.0–100.0)
Monocytes Absolute: 0.6 10*3/uL (ref 0.1–1.0)
Monocytes Relative: 6 %
Neutro Abs: 5.5 10*3/uL (ref 1.7–7.7)
Neutrophils Relative %: 60 %
Platelets: 308 10*3/uL (ref 150–400)
RBC: 3.37 MIL/uL — ABNORMAL LOW (ref 3.87–5.11)
RDW: 12.9 % (ref 11.5–15.5)
WBC: 9 10*3/uL (ref 4.0–10.5)
nRBC: 0 % (ref 0.0–0.2)

## 2022-01-05 LAB — KAPPA/LAMBDA LIGHT CHAINS
Kappa free light chain: 153.8 mg/L — ABNORMAL HIGH (ref 3.3–19.4)
Kappa, lambda light chain ratio: 2.53 — ABNORMAL HIGH (ref 0.26–1.65)
Lambda free light chains: 60.9 mg/L — ABNORMAL HIGH (ref 5.7–26.3)

## 2022-01-09 ENCOUNTER — Ambulatory Visit: Payer: Medicare Other | Admitting: Oncology

## 2022-01-10 LAB — MULTIPLE MYELOMA PANEL, SERUM
Albumin SerPl Elph-Mcnc: 3.1 g/dL (ref 2.9–4.4)
Albumin/Glob SerPl: 1.1 (ref 0.7–1.7)
Alpha 1: 0.3 g/dL (ref 0.0–0.4)
Alpha2 Glob SerPl Elph-Mcnc: 1.1 g/dL — ABNORMAL HIGH (ref 0.4–1.0)
B-Globulin SerPl Elph-Mcnc: 0.8 g/dL (ref 0.7–1.3)
Gamma Glob SerPl Elph-Mcnc: 0.7 g/dL (ref 0.4–1.8)
Globulin, Total: 2.9 g/dL (ref 2.2–3.9)
IgA: 368 mg/dL (ref 64–422)
IgG (Immunoglobin G), Serum: 629 mg/dL (ref 586–1602)
IgM (Immunoglobulin M), Srm: 110 mg/dL (ref 26–217)
Total Protein ELP: 6 g/dL (ref 6.0–8.5)

## 2022-01-15 ENCOUNTER — Inpatient Hospital Stay: Payer: Medicare Other | Admitting: Oncology

## 2022-01-15 ENCOUNTER — Encounter: Payer: Self-pay | Admitting: Oncology

## 2022-01-15 VITALS — BP 137/73 | HR 72 | Temp 96.8°F | Resp 18 | Wt 189.9 lb

## 2022-01-15 DIAGNOSIS — D472 Monoclonal gammopathy: Secondary | ICD-10-CM | POA: Diagnosis not present

## 2022-01-15 DIAGNOSIS — C9000 Multiple myeloma not having achieved remission: Secondary | ICD-10-CM

## 2022-01-15 DIAGNOSIS — N184 Chronic kidney disease, stage 4 (severe): Secondary | ICD-10-CM

## 2022-01-15 DIAGNOSIS — D72820 Lymphocytosis (symptomatic): Secondary | ICD-10-CM

## 2022-01-15 NOTE — Assessment & Plan Note (Signed)
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 Patient has no  hypercalcemia, Hb>10, no bone lesions on x-ray skeletal survey as well as PET scan.  Patient's kidney function has been stable, slightly improved comparing to last visit. Recommend continue observation.

## 2022-01-15 NOTE — Progress Notes (Signed)
Pt here for follow up. Pt reports pain to mid upper back.

## 2022-01-15 NOTE — Assessment & Plan Note (Signed)
Encourage oral hydration avoid nephrotoxins.  Continue follow-up with nephrology. 

## 2022-01-15 NOTE — Assessment & Plan Note (Addendum)
Continue observation.  Stable and normal total WBC and lymphocyte count. 

## 2022-01-15 NOTE — Progress Notes (Signed)
Hematology/Oncology Progress note [phone] [phone]      Patient Care Team: [name], [name] as PCP - General (Family Medicine) Lorelee Cover., MD as Consulting Physician (Ophthalmology) Lovell Sheehan, MD as Consulting Physician (Orthopedic Surgery) Murlean Iba, MD (Nephrology) Earlie Server, MD as Consulting Physician (Oncology) Germaine Pomfret, [facility] (Pharmacist)  ASSESSMENT & PLAN:   Cancer Staging  Multiple myeloma [facility]) Staging form: Plasma Cell Myeloma and Plasma Cell Disorders, AJCC 8th Edition - Clinical: RISS Stage II (Beta-2-microglobulin (mg/L): 7.4, Albumin (g/dL): 4.1, ISS: Stage III, High-risk cytogenetics: Absent, LDH: Normal) - Signed by Earlie Server, MD on 07/31/2021   Multiple myeloma ([place]) #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 Patient has no  hypercalcemia, Hb>10, no bone lesions on x-ray skeletal survey as well as PET scan.  Patient's kidney function has been stable, slightly improved comparing to last visit. Recommend continue observation.  Stage 4 chronic kidney disease ([place]) Encourage oral hydration avoid nephrotoxins.  Continue follow-up with nephrology.  Monoclonal B-cell lymphocytosis of unknown significance Continue observation.  Stable and normal total WBC and lymphocyte count.  Orders Placed This Encounter  Procedures   CBC with Differential/Platelet    Standing Status:   Future    Standing Expiration Date:   01/16/2023   Comprehensive metabolic panel    Standing Status:   Future    Standing Expiration Date:   01/15/2023   Kappa/lambda light chains    Standing Status:   Future    Standing Expiration Date:   01/15/2023   Multiple Myeloma Panel (SPEP&IFE w/QIG)    Standing Status:   Future    Standing Expiration Date:   01/15/2023   IFE+PROTEIN ELECTRO, 24-HR UR     Standing Status:   Future    Standing Expiration Date:   01/16/2023   Follow-up in 4 months. All questions were answered. The patient knows to call the clinic with any problems, questions or concerns.  Earlie Server, MD, PhD [facility] Hematology Oncology 01/15/2022   CHIEF COMPLAINTS/REASON FOR VISIT:  Multiple myeloma, monoclonal lymphocytosis.   HISTORY OF PRESENTING ILLNESS:  Donna Malone is a  76 y.o.  female with PMH listed below who was referred to me for evaluation of leukocytosis Reviewed patient' recent labs obtained by PCP.   10/22/2019 CBC showed elevated white count of 11.3, predominantly lymphocytosis.  Normal hemoglobin and platelet count. Previous lab records reviewed. Leukocytosis onset of chronic, duration is since January 2021.   No aggravating or elevated factors. Associated symptoms or signs:  Denies weight loss, fever, chills,night sweats.  Endorses fatigue Smoking history: Never smoker History of recent oral steroid use or steroid injection: No recent steroid injections. History of recent infection: Denies Autoimmune disease history.  Denies  Patient denies any chronic wound, prosthesis.  She has chronic knee arthritis.  #  flow cytometry showed CD5+ , CD23 + B-cell population.  CLL/SLL phenotype.  CD38 negative, <1% leukocytes,<5000 cells/ul # 06/29/2021, bone marrow biopsy showed hyper cellular marrow with plasma cell neoplasm, 10 to 15% plasma cell infiltrates, predominant.  Cytogenetics is normal.  Multiple myeloma FISH panel normal.  Standard risk.  Patient takes Lasix  for lower extremity swelling. INTERVAL HISTORY Donna Malone is a 76 y.o. female who has above history reviewed by me today presents for follow up smothering multiple myeloma. + Mild upper back pain.  During  interval, patient has had COVID 19 and was treated with a course of antivirals.  She also required being on a course of steroids for other infections.  She cannot recall what  infection she had but was quite sure that she was treated with a course of steroids.  No other new complaints.  Review of Systems  Constitutional:  Positive for fatigue. Negative for appetite change, chills and fever.  HENT:   Negative for hearing loss and voice change.   Eyes:  Negative for eye problems.  Respiratory:  Negative for chest tightness and cough.   Cardiovascular:  Negative for chest pain.  Gastrointestinal:  Negative for abdominal distention, abdominal pain and blood in stool.  Endocrine: Negative for hot flashes.  Genitourinary:  Negative for difficulty urinating and frequency.   Musculoskeletal:  Positive for arthralgias.  Skin:  Negative for itching and rash.  Neurological:  Negative for extremity weakness.  Hematological:  Negative for adenopathy.  Psychiatric/Behavioral:  Negative for confusion.      MEDICAL HISTORY:  Past Medical History:  Diagnosis Date   Bone spur    heel   Depression    Diabetes mellitus without complication ([place])    Gout    Hypertension     SURGICAL HISTORY: Past Surgical History:  Procedure Laterality Date   ABDOMINAL HYSTERECTOMY  04/1970   partial   CHOLECYSTECTOMY  late 1990's   COLONOSCOPY WITH PROPOFOL N/A 10/09/2021   Procedure: COLONOSCOPY WITH PROPOFOL;  Surgeon: Jonathon Bellows, MD;  Location[facility] ENDOSCOPY;  Service: Gastroenterology;  Laterality: N/A;    SOCIAL HISTORY: Social History   Socioeconomic History   Marital status: Widowed    Spouse name: [name]   Number of children: 3   Years of education: Not on file   Highest education level: 12th grade  Occupational History   Occupation: retired  Tobacco Use   Smoking status: Never   Smokeless tobacco: Never  Vaping Use   Vaping Use: Never used  Substance and Sexual Activity   Alcohol use: No   Drug use: No   Sexual activity: Not on file  Other Topics Concern   Not on file  Social History Narrative   Not on file   Social Determinants of Health    Financial Resource Strain: Low Risk  (06/01/2021)   Overall Financial Resource Strain (CARDIA)    Difficulty of Paying Living Expenses: Not hard at all  Food Insecurity: No Food Insecurity (04/27/2021)   Hunger Vital Sign    Worried About Running Out of Food in the Last Year: Never true    [place] in the Last Year: Never true  Transportation Needs: No Transportation Needs (04/27/2021)   PRAPARE - Hydrologist (Medical): No    Lack of Transportation (Non-Medical): No  Physical Activity: Inactive (04/27/2021)   Exercise Vital Sign    Days of Exercise per Week: 0 days    Minutes of Exercise per Session: 0 min  Stress: No Stress Concern Present (04/27/2021)   [place]    Feeling of Stress : Only a little  Social Connections: Moderately Isolated (04/27/2021)   Social Connection and Isolation Panel [NHANES]    Frequency of Communication with Friends and Family: More than three times a week    Frequency of Social Gatherings with Friends and Family: Twice a week    Attends Religious Services: 1 to 4 times per year    Active Member  of Clubs or Organizations: No    Attends Archivist Meetings: Never    Marital Status: Widowed  Intimate Partner Violence: Not At Risk (04/27/2021)   Humiliation, Afraid, Rape, and Kick questionnaire    Fear of Current or Ex-Partner: No    Emotionally Abused: No    Physically Abused: No    Sexually Abused: No    FAMILY HISTORY: Family History  Problem Relation Age of Onset   CAD Mother    Heart attack Mother    Heart disease Father    Diabetes Cousin    Cancer Cousin     ALLERGIES:  is allergic to sulfa antibiotics.  MEDICATIONS:  Current Outpatient Medications  Medication Sig Dispense Refill   acetaminophen (TYLENOL) 500 MG tablet Take 500 mg by mouth every 6 (six) hours as needed for mild pain or headache.     allopurinol (ZYLOPRIM) 100  MG tablet TAKE 1 TABLET BY MOUTH EVERY DAY 90 tablet 1   atorvastatin (LIPITOR) 40 MG tablet Take 1 tablet (40 mg total) by mouth daily. 90 tablet 0   B Complex Vitamins (GNP VITAMIN B COMPLEX PO) Take by mouth daily.      blood glucose meter kit and supplies Dispense based on patient and insurance preference. Use once daily for FBG. (FOR ICD-10 E10.9, E11.9). 1 each 0   Blood Glucose Monitoring Suppl (ONETOUCH VERIO) w/Device KIT To check blood sugar once daily 1 kit 0   buPROPion (WELLBUTRIN XL) 150 MG 24 hr tablet TAKE 1 TABLET BY MOUTH EVERY DAY 90 tablet 1   escitalopram (LEXAPRO) 20 MG tablet TAKE 1 TABLET BY MOUTH EVERY DAY 90 tablet 0   famotidine (PEPCID) 40 MG tablet TAKE 1 TABLET BY MOUTH EVERY DAY 90 tablet 3   ferrous sulfate 325 (65 FE) MG EC tablet Take 325 mg by mouth daily.     glipiZIDE (GLUCOTROL XL) 10 MG 24 hr tablet TAKE 1 TABLET (10 MG TOTAL) BY MOUTH DAILY WITH BREAKFAST. 90 tablet 0   KRILL OIL PO Take by mouth.     Lancets (ONETOUCH ULTRASOFT) lancets To check blood sugar once daily 100 each 12   levothyroxine (SYNTHROID) 75 MCG tablet TAKE 1 TABLET BY MOUTH EVERY DAY 90 tablet 3   MAGNESIUM-OXIDE PO Take by mouth.     meclizine (ANTIVERT) 25 MG tablet Take 1-2 tablets (25-50 mg total) by mouth 3 (three) times daily as needed for dizziness. 30 tablet 0   Multiple Vitamins-Minerals (PRESERVISION AREDS 2) CAPS Take 1 capsule by mouth in the morning and at bedtime.     ONETOUCH VERIO test strip TO CHECK BLOOD SUGAR ONCE DAILY 50 strip 25   Polyethyl Glycol-Propyl Glycol (SYSTANE HYDRATION PF OP) Place 1 drop into both eyes daily as needed (for dry eyes).     traZODone (DESYREL) 50 MG tablet TAKE 1 TABLET BY MOUTH EVERYDAY AT BEDTIME 90 tablet 1   Vitamin D, Ergocalciferol, (DRISDOL) 1.25 MG (50000 UNIT) CAPS capsule TAKE 1 CAPSULE (50,000 UNITS TOTAL) BY MOUTH EVERY 7 (SEVEN) DAYS 12 capsule 3   benzonatate (TESSALON) 100 MG capsule Take 1 capsule (100 mg total) by mouth 2  (two) times daily as needed for cough. (Patient not taking: Reported on 01/15/2022) 20 capsule 0   ondansetron (ZOFRAN) 4 MG tablet Take 1 tablet (4 mg total) by mouth every 8 (eight) hours as needed for nausea or vomiting. (Patient not taking: Reported on 01/15/2022) 30 tablet 1   No current facility-administered medications for  this visit.     PHYSICAL EXAMINATION: ECOG PERFORMANCE STATUS: 1 - Symptomatic but completely ambulatory Vitals:   01/15/22 1039  BP: 137/73  Pulse: 72  Resp: 18  Temp: (!) 96.8 F (36 C)   Filed Weights   01/15/22 1039  Weight: 189 lb 14.4 oz (86.1 kg)    Physical Exam Constitutional:      General: She is not in acute distress.    Comments: Patient walks independently  HENT:     Head: Normocephalic and atraumatic.  Eyes:     General: No scleral icterus. Cardiovascular:     Rate and Rhythm: Normal rate and regular rhythm.     Heart sounds: Normal heart sounds.  Pulmonary:     Effort: Pulmonary effort is normal. No respiratory distress.     Breath sounds: No wheezing.  Abdominal:     General: Bowel sounds are normal. There is no distension.     Palpations: Abdomen is soft.  Musculoskeletal:        General: No deformity. Normal range of motion.     Cervical back: Normal range of motion and neck supple.  Skin:    General: Skin is warm and dry.     Findings: No erythema or rash.  Neurological:     Mental Status: She is alert and oriented to person, place, and time. Mental status is at baseline.     Cranial Nerves: No cranial nerve deficit.     Coordination: Coordination normal.  Psychiatric:        Mood and Affect: Mood normal.        Latest Ref Rng & Units 01/04/2022   11:07 AM  CMP  Glucose 70 - 99 mg/dL 157   BUN 8 - 23 mg/dL 40   Creatinine 0.44 - 1.00 mg/dL 2.51   Sodium 135 - 145 mmol/L 136   Potassium 3.5 - 5.1 mmol/L 5.0   Chloride 98 - 111 mmol/L 108   CO2 22 - 32 mmol/L 23   Calcium 8.9 - 10.3 mg/dL 9.0   Total Protein  6.5 - 8.1 g/dL 6.8   Total Bilirubin 0.3 - 1.2 mg/dL 0.5   Alkaline Phos 38 - 126 U/L 118   AST 15 - 41 U/L 21   ALT 0 - 44 U/L 17       Latest Ref Rng & Units 01/04/2022   11:07 AM  CBC  WBC 4.0 - 10.5 K/uL 9.0   Hemoglobin 12.0 - 15.0 g/dL 11.3   Hematocrit 36.0 - 46.0 % 33.9   Platelets 150 - 400 K/uL 308      RADIOGRAPHIC STUDIES: I have personally reviewed the radiological images as listed and agreed with the findings in the report. No results found.  LABORATORY DATA:  I have reviewed the data as listed Lab Results  Component Value Date   WBC 9.0 01/04/2022   HGB 11.3 (L) 01/04/2022   HCT 33.9 (L) 01/04/2022   MCV 100.6 (H) 01/04/2022   PLT 308 01/04/2022   Recent Labs    05/04/21 1408 09/21/21 1022 10/12/21 0902 11/01/21 1553 11/07/21 1255 01/04/22 1107  NA 136 133* 141 140 139 136  K 3.6 3.5 3.9 4.7 4.9 5.0  CL 99 103 112* 107* 110 108  CO2 20 19* 19* 19* 21* 23  GLUCOSE 144* 141* 122* 117* 161* 157*  BUN 28* 42* 24* 22 28* 40*  CREATININE 1.82* 3.40* 2.23* 2.26* 2.51* 2.51*  CALCIUM 9.9 8.9 8.9 9.3 9.0 9.0  GFRNONAA  --  13* 22*  --  19* 19*  PROT 7.4 6.8  --   --   --  6.8  ALBUMIN 4.1 3.6  --   --   --  3.5  AST 16 24  --   --   --  21  ALT 11 24  --   --   --  17  ALKPHOS 126* 96  --   --   --  118  BILITOT 0.4 0.8  --   --   --  0.5    Iron/TIBC/Ferritin/ %Sat    Component Value Date/Time   IRON 56 04/23/2017 1644   TIBC 362 04/23/2017 1644   FERRITIN 19 04/23/2017 1644   IRONPCTSAT 15 04/23/2017 1644

## 2022-01-17 ENCOUNTER — Other Ambulatory Visit: Payer: Self-pay | Admitting: Family Medicine

## 2022-01-17 ENCOUNTER — Telehealth: Payer: Self-pay

## 2022-01-17 DIAGNOSIS — F5104 Psychophysiologic insomnia: Secondary | ICD-10-CM

## 2022-01-17 DIAGNOSIS — E1122 Type 2 diabetes mellitus with diabetic chronic kidney disease: Secondary | ICD-10-CM

## 2022-01-17 MED ORDER — ACCU-CHEK SOFTCLIX LANCETS MISC: 12 refills | Status: AC

## 2022-01-17 MED ORDER — ACCU-CHEK GUIDE VI STRP: ORAL_STRIP | 12 refills | Status: DC

## 2022-01-17 MED ORDER — ACCU-CHEK GUIDE ME W/DEVICE KIT: 1.0000 | PACK | Freq: Once | 1 refills | Status: AC

## 2022-01-17 NOTE — Telephone Encounter (Signed)
Requested Prescriptions  Pending Prescriptions Disp Refills  . traZODone (DESYREL) 50 MG tablet [Pharmacy Med Name: TRAZODONE 50 MG TABLET] 90 tablet 1    Sig: TAKE 1 TABLET BY MOUTH EVERYDAY AT BEDTIME     Psychiatry: Antidepressants - Serotonin Modulator Passed - 01/17/2022  9:44 AM      Passed - Completed PHQ-2 or PHQ-9 in the last 360 days      Passed - Valid encounter within last 6 months    Recent Outpatient Visits          2 months ago Type 2 diabetes mellitus with stage 4 chronic kidney disease, with long-term current use of insulin [facility])   [facility] Tally Joe T, [name]   5 months ago Type 2 diabetes mellitus with stage 3b chronic kidney disease, without long-term current use of insulin [facility])   [facility] Tally Joe T, [name]   7 months ago Depression, recurrent [facility])   [facility] Tally Joe T, [name]   8 months ago Hypertension associated with diabetes [facility])   [facility] Tally Joe T, [name]   1 year ago Type 2 diabetes mellitus with stage 3b chronic kidney disease, without long-term current use of insulin [facility])   [facility], Dionne Bucy, MD

## 2022-01-17 NOTE — Telephone Encounter (Signed)
Called patient to let her know we can print off her AVS and have it at the front desk for her to pick up.

## 2022-01-18 ENCOUNTER — Telehealth: Payer: Self-pay

## 2022-01-18 NOTE — Telephone Encounter (Signed)
It is printed and at the front for pick up. Called patient yesterday to let her know.

## 2022-01-18 NOTE — Telephone Encounter (Signed)
Copied from [place] [id number]. Topic: Medical Record Request - Patient ROI Request >> Jan 16, 2022  9:35 AM Donna Malone wrote: Reason for CRM: The patient has called to request a copy of their most recent after visit summary and office notes from 11/01/21  The patient is considering a move to [facility]   Please contact the patient further if needed

## 2022-01-25 ENCOUNTER — Encounter: Payer: Medicare Other | Admitting: Plastic Surgery

## 2022-01-25 ENCOUNTER — Telehealth: Payer: Self-pay | Admitting: *Deleted

## 2022-01-25 NOTE — Telephone Encounter (Signed)
Patient called reporting that she is still having shoulder pain bilaterally in the scapulas, worse at night when she lies down and when she gets up and has pain all day long too. Rates pain at 10/10. She only takes Tylenol for the  pain as she does not have pain medicine. She states that Dr Tasia Catchings told her that if the pain did not get better that she wanted to do a PET scan on her so she is asking for that to be arranged. Please advise

## 2022-01-26 ENCOUNTER — Other Ambulatory Visit: Payer: Self-pay

## 2022-01-26 ENCOUNTER — Encounter: Payer: Medicare Other | Admitting: Plastic Surgery

## 2022-01-26 ENCOUNTER — Other Ambulatory Visit: Payer: Self-pay | Admitting: Oncology

## 2022-01-26 DIAGNOSIS — M549 Dorsalgia, unspecified: Secondary | ICD-10-CM

## 2022-01-26 MED ORDER — TRAMADOL HCL 50 MG PO TABS: 50.0000 mg | ORAL_TABLET | Freq: Three times a day (TID) | ORAL | 0 refills | Status: DC | PRN

## 2022-01-26 NOTE — Telephone Encounter (Signed)
Since Pet was negative 6 months ago, Dr. Tasia Catchings would like to obtain MRi spine wo contrast for further eval of upper back pain. If pain is severe then Tramadol is recomended. Rx sent to pharmacy. If pt experiences any shortness of breath or chest pain she recommends ER. Pt nformed and verbalized understanding.

## 2022-01-26 NOTE — Telephone Encounter (Signed)
Please schedule MRI thoracic spine ASAP and inform pt of appt. Pt aware of plan.

## 2022-01-28 ENCOUNTER — Ambulatory Visit
Admission: RE | Admit: 2022-01-28 | Discharge: 2022-01-28 | Disposition: A | Discharge status: Home / Self Care | Payer: Medicare Other | Source: Ambulatory Visit | Attending: Oncology | Admitting: Oncology

## 2022-01-28 DIAGNOSIS — M549 Dorsalgia, unspecified: Secondary | ICD-10-CM | POA: Insufficient documentation

## 2022-01-31 ENCOUNTER — Telehealth: Payer: Self-pay | Admitting: *Deleted

## 2022-01-31 NOTE — Telephone Encounter (Signed)
Patient called asking for MRI results  IMPRESSION: 1. Generalized degeneration with notable discogenic edema at C7-T1 and T9-10. 2. T9-10 right more than left foraminal impingement primarily due to disc height loss and bulging. 3. Diffusely patent spinal canal. 4. History of myeloma.  No focal bone lesion.     Electronically Signed   By: Jorje Guild M.D.   On: 01/31/2022 10:29

## 2022-02-01 NOTE — Telephone Encounter (Signed)
Call returned to patient and informed of doctor response. She will contact her PCP Tally Joe, [name] for follow up

## 2022-02-05 NOTE — Telephone Encounter (Unsigned)
Copied from [place] [phone]. Topic: General - Other >> Feb 01, 2022 11:33 AM Ludger Nutting wrote: Patient states she had an MRI done on 01/28/22 and wants Tally Joe, NP to go over results and reach out to her to discuss them.

## 2022-02-06 NOTE — Telephone Encounter (Signed)
Printed and at the front desk. Called patient to let her know. LMTCB

## 2022-02-06 NOTE — Telephone Encounter (Signed)
Copied from [place] [phone]. Topic: General - Inquiry >> Feb 06, 2022 11:37 AM Ludger Nutting wrote: Patient is requesting a copy of her MRI report from 01/28/22. Please follow up with patient.

## 2022-02-09 ENCOUNTER — Other Ambulatory Visit: Payer: Self-pay | Admitting: Family Medicine

## 2022-02-09 DIAGNOSIS — E559 Vitamin D deficiency, unspecified: Secondary | ICD-10-CM

## 2022-02-09 NOTE — Telephone Encounter (Signed)
Requested medications are due for refill today.  no  Requested medications are on the active medications list.  yes  Last refill. 05/26/2021 #12 3 rf  Future visit scheduled.   yes  Notes to clinic.  Refill not delegated.    Requested Prescriptions  Pending Prescriptions Disp Refills   Vitamin D, Ergocalciferol, (DRISDOL) 1.25 MG (50000 UNIT) CAPS capsule [Pharmacy Med Name: VITAMIN D2 1.'25MG'$ (50,000 UNIT)] 12 capsule 3    Sig: TAKE 1 CAPSULE (50,000 UNITS TOTAL) BY MOUTH EVERY 7 (SEVEN) DAYS     Endocrinology:  Vitamins - Vitamin D Supplementation 2 Failed - 02/09/2022  2:33 PM      Failed - Manual Review: Route requests for 50,000 IU strength to the provider      Failed - Vitamin D in normal range and within 360 days    Vit D, 25-Hydroxy  Date Value Ref Range Status  09/28/2020 81.9 30.0 - 100.0 ng/mL Final    Comment:    Vitamin D deficiency has been defined by the Institute of Medicine and an Endocrine Society practice guideline as a level of serum 25-OH vitamin D less than 20 ng/mL (1,2). The Endocrine Society went on to further define vitamin D insufficiency as a level between 21 and 29 ng/mL (2). 1. IOM (Institute of Medicine). 2010. Dietary reference    intakes for calcium and D. [place]: The    [employer]. 2. Holick MF, [place], Bischoff-Ferrari HA, et al.    Evaluation, treatment, and prevention of vitamin D    deficiency: an Endocrine Society clinical practice    guideline. JCEM. 2011 Jul; 96(7):1911-30.          Passed - Ca in normal range and within 360 days    Calcium  Date Value Ref Range Status  01/04/2022 9.0 8.9 - 10.3 mg/dL Final         Passed - Valid encounter within last 12 months    Recent Outpatient Visits           3 months ago Type 2 diabetes mellitus with stage 4 chronic kidney disease, with long-term current use of insulin [facility])   [facility] Tally Joe T, [name]   6 months ago Type 2 diabetes mellitus  with stage 3b chronic kidney disease, without long-term current use of insulin [facility])   [facility] Tally Joe T, [name]   7 months ago Depression, recurrent [facility])   [facility] Tally Joe T, [name]   9 months ago Hypertension associated with diabetes [facility])   [facility] Tally Joe T, [name]   1 year ago Type 2 diabetes mellitus with stage 3b chronic kidney disease, without long-term current use of insulin [facility])   [facility], Dionne Bucy, MD       Future Appointments             In 2 months [name], [place], [facility]

## 2022-03-09 ENCOUNTER — Other Ambulatory Visit
Admission: RE | Admit: 2022-03-09 | Discharge: 2022-03-09 | Disposition: A | Discharge status: Home / Self Care | Payer: Medicare Other | Source: Ambulatory Visit | Attending: Nephrology | Admitting: Nephrology

## 2022-03-09 DIAGNOSIS — R803 Bence Jones proteinuria: Secondary | ICD-10-CM | POA: Diagnosis not present

## 2022-03-09 DIAGNOSIS — E1122 Type 2 diabetes mellitus with diabetic chronic kidney disease: Secondary | ICD-10-CM | POA: Diagnosis present

## 2022-03-09 DIAGNOSIS — D472 Monoclonal gammopathy: Secondary | ICD-10-CM | POA: Diagnosis not present

## 2022-03-09 DIAGNOSIS — N184 Chronic kidney disease, stage 4 (severe): Secondary | ICD-10-CM | POA: Diagnosis not present

## 2022-03-09 LAB — URINALYSIS, COMPLETE (UACMP) WITH MICROSCOPIC
Bilirubin Urine: NEGATIVE
Glucose, UA: NEGATIVE mg/dL
Hgb urine dipstick: NEGATIVE
Ketones, ur: NEGATIVE mg/dL
Nitrite: NEGATIVE
Protein, ur: 30 mg/dL — AB
Specific Gravity, Urine: 1.013 (ref 1.005–1.030)
pH: 5 (ref 5.0–8.0)

## 2022-03-09 LAB — CBC WITH DIFFERENTIAL/PLATELET
Abs Immature Granulocytes: 0.02 10*3/uL (ref 0.00–0.07)
Basophils Absolute: 0.1 10*3/uL (ref 0.0–0.1)
Basophils Relative: 1 %
Eosinophils Absolute: 0.3 10*3/uL (ref 0.0–0.5)
Eosinophils Relative: 3 %
HCT: 34.8 % — ABNORMAL LOW (ref 36.0–46.0)
Hemoglobin: 11.3 g/dL — ABNORMAL LOW (ref 12.0–15.0)
Immature Granulocytes: 0 %
Lymphocytes Relative: 26 %
Lymphs Abs: 2.3 10*3/uL (ref 0.7–4.0)
MCH: 33.1 pg (ref 26.0–34.0)
MCHC: 32.5 g/dL (ref 30.0–36.0)
MCV: 102.1 fL — ABNORMAL HIGH (ref 80.0–100.0)
Monocytes Absolute: 0.6 10*3/uL (ref 0.1–1.0)
Monocytes Relative: 7 %
Neutro Abs: 5.5 10*3/uL (ref 1.7–7.7)
Neutrophils Relative %: 63 %
Platelets: 238 10*3/uL (ref 150–400)
RBC: 3.41 MIL/uL — ABNORMAL LOW (ref 3.87–5.11)
RDW: 14.6 % (ref 11.5–15.5)
WBC: 8.8 10*3/uL (ref 4.0–10.5)
nRBC: 0 % (ref 0.0–0.2)

## 2022-03-09 LAB — RENAL FUNCTION PANEL
Albumin: 3.8 g/dL (ref 3.5–5.0)
Anion gap: 6 (ref 5–15)
BUN: 40 mg/dL — ABNORMAL HIGH (ref 8–23)
CO2: 17 mmol/L — ABNORMAL LOW (ref 22–32)
Calcium: 9.6 mg/dL (ref 8.9–10.3)
Chloride: 115 mmol/L — ABNORMAL HIGH (ref 98–111)
Creatinine, Ser: 2.38 mg/dL — ABNORMAL HIGH (ref 0.44–1.00)
GFR, Estimated: 20 mL/min — ABNORMAL LOW (ref >60)
Glucose, Bld: 156 mg/dL — ABNORMAL HIGH (ref 70–99)
Phosphorus: 3.5 mg/dL (ref 2.5–4.6)
Potassium: 4 mmol/L (ref 3.5–5.1)
Sodium: 138 mmol/L (ref 135–145)

## 2022-03-09 LAB — PROTEIN / CREATININE RATIO, URINE
Creatinine, Urine: 130 mg/dL
Protein Creatinine Ratio: 0.36 mg/mg{Cre} — ABNORMAL HIGH (ref 0.00–0.15)
Total Protein, Urine: 47 mg/dL

## 2022-03-10 ENCOUNTER — Other Ambulatory Visit: Payer: Self-pay | Admitting: Family Medicine

## 2022-03-10 DIAGNOSIS — Z1331 Encounter for screening for depression: Secondary | ICD-10-CM

## 2022-03-10 DIAGNOSIS — F332 Major depressive disorder, recurrent severe without psychotic features: Secondary | ICD-10-CM

## 2022-03-10 DIAGNOSIS — F3342 Major depressive disorder, recurrent, in full remission: Secondary | ICD-10-CM

## 2022-03-11 ENCOUNTER — Other Ambulatory Visit: Payer: Self-pay | Admitting: Family Medicine

## 2022-03-11 DIAGNOSIS — E7800 Pure hypercholesterolemia, unspecified: Secondary | ICD-10-CM

## 2022-03-12 ENCOUNTER — Telehealth: Payer: Self-pay

## 2022-03-12 LAB — PARATHYROID HORMONE, INTACT (NO CA): PTH: 44 pg/mL (ref 15–65)

## 2022-03-12 NOTE — Telephone Encounter (Signed)
Copied from [place] [id number]. Topic: General - Inquiry >> Mar 09, 2022  3:52 PM Donna Malone wrote: Reason for CRM: pt following up on two vaccines she was given in office.  She was told that it needs to be filed under the pharmacy part. Pt called about this several months ago, and gets another bill today.  1. Shingles   2. shingix Please follow up

## 2022-03-13 ENCOUNTER — Other Ambulatory Visit: Payer: Self-pay

## 2022-03-13 DIAGNOSIS — N184 Chronic kidney disease, stage 4 (severe): Secondary | ICD-10-CM | POA: Insufficient documentation

## 2022-03-13 DIAGNOSIS — K3580 Unspecified acute appendicitis: Principal | ICD-10-CM | POA: Insufficient documentation

## 2022-03-13 DIAGNOSIS — R1031 Right lower quadrant pain: Secondary | ICD-10-CM | POA: Diagnosis present

## 2022-03-13 DIAGNOSIS — I129 Hypertensive chronic kidney disease with stage 1 through stage 4 chronic kidney disease, or unspecified chronic kidney disease: Secondary | ICD-10-CM | POA: Insufficient documentation

## 2022-03-13 DIAGNOSIS — E1122 Type 2 diabetes mellitus with diabetic chronic kidney disease: Secondary | ICD-10-CM | POA: Insufficient documentation

## 2022-03-13 DIAGNOSIS — Z7984 Long term (current) use of oral hypoglycemic drugs: Secondary | ICD-10-CM | POA: Insufficient documentation

## 2022-03-13 DIAGNOSIS — E039 Hypothyroidism, unspecified: Secondary | ICD-10-CM | POA: Diagnosis not present

## 2022-03-13 DIAGNOSIS — Z79899 Other long term (current) drug therapy: Secondary | ICD-10-CM | POA: Diagnosis not present

## 2022-03-13 LAB — CBC
HCT: 37 % (ref 36.0–46.0)
Hemoglobin: 11.5 g/dL — ABNORMAL LOW (ref 12.0–15.0)
MCH: 33.4 pg (ref 26.0–34.0)
MCHC: 31.1 g/dL (ref 30.0–36.0)
MCV: 107.6 fL — ABNORMAL HIGH (ref 80.0–100.0)
Platelets: 234 10*3/uL (ref 150–400)
RBC: 3.44 MIL/uL — ABNORMAL LOW (ref 3.87–5.11)
RDW: 14.7 % (ref 11.5–15.5)
WBC: 13.8 10*3/uL — ABNORMAL HIGH (ref 4.0–10.5)
nRBC: 0 % (ref 0.0–0.2)

## 2022-03-13 NOTE — ED Triage Notes (Signed)
Pt with diarrhea and rlq pain for two hours, no fever, no vomiting, no dysuria, vaginal bleeding or discharge per pt. Pt denies nausea. Pt appears in no acute distress, moist oral mucus membranes noted.

## 2022-03-14 ENCOUNTER — Observation Stay: Payer: Medicare Other | Admitting: Anesthesiology

## 2022-03-14 ENCOUNTER — Encounter: Payer: Self-pay | Admitting: Surgery

## 2022-03-14 ENCOUNTER — Other Ambulatory Visit: Payer: Self-pay

## 2022-03-14 ENCOUNTER — Emergency Department: Payer: Medicare Other

## 2022-03-14 ENCOUNTER — Encounter
Admission: EM | Disposition: A | Discharge status: Home / Self Care | Payer: Self-pay | Source: Home / Self Care | Attending: Emergency Medicine

## 2022-03-14 ENCOUNTER — Observation Stay
Admission: EM | Admit: 2022-03-14 | Discharge: 2022-03-15 | Disposition: A | Discharge status: Home / Self Care | Payer: Medicare Other | Attending: Emergency Medicine | Admitting: Emergency Medicine

## 2022-03-14 DIAGNOSIS — K3580 Unspecified acute appendicitis: Secondary | ICD-10-CM

## 2022-03-14 DIAGNOSIS — N390 Urinary tract infection, site not specified: Secondary | ICD-10-CM

## 2022-03-14 DIAGNOSIS — K37 Unspecified appendicitis: Secondary | ICD-10-CM | POA: Diagnosis present

## 2022-03-14 DIAGNOSIS — R1031 Right lower quadrant pain: Secondary | ICD-10-CM

## 2022-03-14 HISTORY — PX: LAPAROSCOPIC APPENDECTOMY: SHX408

## 2022-03-14 LAB — URINALYSIS, ROUTINE W REFLEX MICROSCOPIC
Bilirubin Urine: NEGATIVE
Glucose, UA: NEGATIVE mg/dL
Hgb urine dipstick: NEGATIVE
Ketones, ur: NEGATIVE mg/dL
Nitrite: NEGATIVE
Protein, ur: NEGATIVE mg/dL
Specific Gravity, Urine: 1.013 (ref 1.005–1.030)
WBC, UA: >50 WBC/hpf — ABNORMAL HIGH (ref 0–5)
pH: 5 (ref 5.0–8.0)

## 2022-03-14 LAB — COMPREHENSIVE METABOLIC PANEL
ALT: 19 U/L (ref 0–44)
AST: 23 U/L (ref 15–41)
Albumin: 3.9 g/dL (ref 3.5–5.0)
Alkaline Phosphatase: 100 U/L (ref 38–126)
Anion gap: 7 (ref 5–15)
BUN: 36 mg/dL — ABNORMAL HIGH (ref 8–23)
CO2: 19 mmol/L — ABNORMAL LOW (ref 22–32)
Calcium: 9.7 mg/dL (ref 8.9–10.3)
Chloride: 112 mmol/L — ABNORMAL HIGH (ref 98–111)
Creatinine, Ser: 2.36 mg/dL — ABNORMAL HIGH (ref 0.44–1.00)
GFR, Estimated: 21 mL/min — ABNORMAL LOW (ref >60)
Glucose, Bld: 157 mg/dL — ABNORMAL HIGH (ref 70–99)
Potassium: 4.4 mmol/L (ref 3.5–5.1)
Sodium: 138 mmol/L (ref 135–145)
Total Bilirubin: 0.8 mg/dL (ref 0.3–1.2)
Total Protein: 7.1 g/dL (ref 6.5–8.1)

## 2022-03-14 LAB — LIPASE, BLOOD: Lipase: 45 U/L (ref 11–51)

## 2022-03-14 LAB — GLUCOSE, CAPILLARY: Glucose-Capillary: 155 mg/dL — ABNORMAL HIGH (ref 70–99)

## 2022-03-14 LAB — CBG MONITORING, ED
Glucose-Capillary: 120 mg/dL — ABNORMAL HIGH (ref 70–99)
Glucose-Capillary: 125 mg/dL — ABNORMAL HIGH (ref 70–99)
Glucose-Capillary: 111 mg/dL — ABNORMAL HIGH (ref 70–99)
Glucose-Capillary: 145 mg/dL — ABNORMAL HIGH (ref 70–99)

## 2022-03-14 SURGERY — APPENDECTOMY, LAPAROSCOPIC
Anesthesia: General | Site: Abdomen

## 2022-03-14 MED ORDER — MIDAZOLAM HCL 2 MG/2ML IJ SOLN
INTRAMUSCULAR | Status: DC | PRN
  Administered 2022-03-14: 1 mg via INTRAVENOUS

## 2022-03-14 MED ORDER — ESCITALOPRAM OXALATE 20 MG PO TABS
20.0000 mg | ORAL_TABLET | Freq: Every day | ORAL | Status: DC
  Administered 2022-03-15: 20 mg via ORAL
  Filled 2022-03-14: qty 1

## 2022-03-14 MED ORDER — LEVOTHYROXINE SODIUM 75 MCG PO TABS
75.0000 ug | ORAL_TABLET | Freq: Every day | ORAL | Status: DC
  Administered 2022-03-15: 75 ug via ORAL
  Filled 2022-03-14: qty 1

## 2022-03-14 MED ORDER — SUGAMMADEX SODIUM 200 MG/2ML IV SOLN
INTRAVENOUS | Status: DC | PRN
  Administered 2022-03-14: 200 mg via INTRAVENOUS

## 2022-03-14 MED ORDER — LIDOCAINE HCL (CARDIAC) PF 100 MG/5ML IV SOSY
PREFILLED_SYRINGE | INTRAVENOUS | Status: DC | PRN
  Administered 2022-03-14: 50 mg via INTRAVENOUS

## 2022-03-14 MED ORDER — ATORVASTATIN CALCIUM 20 MG PO TABS
40.0000 mg | ORAL_TABLET | Freq: Every day | ORAL | Status: DC
  Administered 2022-03-15: 40 mg via ORAL

## 2022-03-14 MED ORDER — OXYCODONE HCL 5 MG PO TABS: 5.0000 mg | ORAL_TABLET | ORAL | Status: DC | PRN

## 2022-03-14 MED ORDER — ROCURONIUM BROMIDE 100 MG/10ML IV SOLN
INTRAVENOUS | Status: DC | PRN
  Administered 2022-03-14: 30 mg via INTRAVENOUS
  Administered 2022-03-14: 40 mg via INTRAVENOUS

## 2022-03-14 MED ORDER — MORPHINE SULFATE (PF) 2 MG/ML IV SOLN: 2.0000 mg | INTRAVENOUS | Status: DC | PRN

## 2022-03-14 MED ORDER — SODIUM CHLORIDE 0.9 % IR SOLN
Status: DC | PRN
  Administered 2022-03-14: 1000 mL

## 2022-03-14 MED ORDER — ONDANSETRON HCL 4 MG/2ML IJ SOLN: 4.0000 mg | Freq: Four times a day (QID) | INTRAMUSCULAR | Status: DC | PRN

## 2022-03-14 MED ORDER — ONDANSETRON HCL 4 MG/2ML IJ SOLN
4.0000 mg | Freq: Once | INTRAMUSCULAR | Status: AC
  Administered 2022-03-14: 4 mg via INTRAVENOUS
  Filled 2022-03-14: qty 2

## 2022-03-14 MED ORDER — ACETAMINOPHEN 10 MG/ML IV SOLN
INTRAVENOUS | Status: DC | PRN
  Administered 2022-03-14: 1000 mg via INTRAVENOUS

## 2022-03-14 MED ORDER — PIPERACILLIN-TAZOBACTAM IN DEX 2-0.25 GM/50ML IV SOLN
2.2500 g | Freq: Three times a day (TID) | INTRAVENOUS | Status: DC
  Administered 2022-03-14 – 2022-03-15 (×2): 2.25 g via INTRAVENOUS
  Filled 2022-03-14 (×3): qty 50

## 2022-03-14 MED ORDER — LACTATED RINGERS IV SOLN: INTRAVENOUS | Status: DC | PRN

## 2022-03-14 MED ORDER — ACETAMINOPHEN 10 MG/ML IV SOLN
INTRAVENOUS | Status: AC
  Filled 2022-03-14: qty 100

## 2022-03-14 MED ORDER — DIPHENHYDRAMINE HCL 50 MG/ML IJ SOLN: 12.5000 mg | Freq: Four times a day (QID) | INTRAMUSCULAR | Status: DC | PRN

## 2022-03-14 MED ORDER — INSULIN ASPART 100 UNIT/ML IJ SOLN: 0.0000 [IU] | INTRAMUSCULAR | Status: DC

## 2022-03-14 MED ORDER — ONDANSETRON 4 MG PO TBDP: 4.0000 mg | ORAL_TABLET | Freq: Four times a day (QID) | ORAL | Status: DC | PRN

## 2022-03-14 MED ORDER — 0.9 % SODIUM CHLORIDE (POUR BTL) OPTIME
TOPICAL | Status: DC | PRN
  Administered 2022-03-14: 500 mL

## 2022-03-14 MED ORDER — FENTANYL CITRATE (PF) 100 MCG/2ML IJ SOLN: 25.0000 ug | INTRAMUSCULAR | Status: DC | PRN

## 2022-03-14 MED ORDER — DEXAMETHASONE SODIUM PHOSPHATE 10 MG/ML IJ SOLN
INTRAMUSCULAR | Status: DC | PRN
  Administered 2022-03-14: 10 mg via INTRAVENOUS

## 2022-03-14 MED ORDER — ACETAMINOPHEN 500 MG PO TABS: 1000.0000 mg | ORAL_TABLET | Freq: Four times a day (QID) | ORAL | Status: DC | PRN

## 2022-03-14 MED ORDER — HEPARIN SODIUM (PORCINE) 5000 UNIT/ML IJ SOLN: 5000.0000 [IU] | Freq: Three times a day (TID) | INTRAMUSCULAR | Status: DC

## 2022-03-14 MED ORDER — PROPOFOL 10 MG/ML IV BOLUS
INTRAVENOUS | Status: DC | PRN
  Administered 2022-03-14: 100 mg via INTRAVENOUS

## 2022-03-14 MED ORDER — BUPIVACAINE-EPINEPHRINE (PF) 0.5% -1:200000 IJ SOLN
INTRAMUSCULAR | Status: DC | PRN
  Administered 2022-03-14: 30 mL

## 2022-03-14 MED ORDER — PANTOPRAZOLE SODIUM 40 MG IV SOLR: 40.0000 mg | Freq: Every day | INTRAVENOUS | Status: DC

## 2022-03-14 MED ORDER — BUPIVACAINE-EPINEPHRINE (PF) 0.5% -1:200000 IJ SOLN
INTRAMUSCULAR | Status: AC
  Filled 2022-03-14: qty 30

## 2022-03-14 MED ORDER — FENTANYL CITRATE (PF) 100 MCG/2ML IJ SOLN
INTRAMUSCULAR | Status: DC | PRN
  Administered 2022-03-14 (×2): 25 ug via INTRAVENOUS

## 2022-03-14 MED ORDER — DIPHENHYDRAMINE HCL 12.5 MG/5ML PO ELIX: 12.5000 mg | ORAL_SOLUTION | Freq: Four times a day (QID) | ORAL | Status: DC | PRN

## 2022-03-14 MED ORDER — BUPROPION HCL ER (XL) 150 MG PO TB24
150.0000 mg | ORAL_TABLET | Freq: Every day | ORAL | Status: DC
  Administered 2022-03-15: 150 mg via ORAL
  Filled 2022-03-14: qty 1

## 2022-03-14 MED ORDER — ATORVASTATIN CALCIUM 20 MG PO TABS
ORAL_TABLET | ORAL | Status: AC
  Administered 2022-03-14: 40 mg via ORAL
  Filled 2022-03-14: qty 2

## 2022-03-14 MED ORDER — INSULIN ASPART 100 UNIT/ML IJ SOLN
INTRAMUSCULAR | Status: AC
  Administered 2022-03-14: 2 [IU] via SUBCUTANEOUS
  Filled 2022-03-14: qty 1

## 2022-03-14 MED ORDER — PIPERACILLIN-TAZOBACTAM 3.375 G IVPB 30 MIN
3.3750 g | Freq: Once | INTRAVENOUS | Status: AC
  Administered 2022-03-14: 3.375 g via INTRAVENOUS
  Filled 2022-03-14: qty 50

## 2022-03-14 MED ORDER — OXYCODONE HCL 5 MG/5ML PO SOLN: 5.0000 mg | Freq: Once | ORAL | Status: DC | PRN

## 2022-03-14 MED ORDER — OXYCODONE HCL 5 MG PO TABS: 5.0000 mg | ORAL_TABLET | Freq: Once | ORAL | Status: DC | PRN

## 2022-03-14 MED ORDER — FERROUS SULFATE 325 (65 FE) MG PO TABS
325.0000 mg | ORAL_TABLET | Freq: Every day | ORAL | Status: DC
  Administered 2022-03-15: 325 mg via ORAL

## 2022-03-14 MED ORDER — PANTOPRAZOLE SODIUM 40 MG IV SOLR
INTRAVENOUS | Status: AC
  Administered 2022-03-14: 40 mg via INTRAVENOUS
  Filled 2022-03-14: qty 10

## 2022-03-14 MED ORDER — ONDANSETRON HCL 4 MG/2ML IJ SOLN
INTRAMUSCULAR | Status: DC | PRN
  Administered 2022-03-14: 4 mg via INTRAVENOUS

## 2022-03-14 MED ORDER — MORPHINE SULFATE (PF) 2 MG/ML IV SOLN
2.0000 mg | Freq: Once | INTRAVENOUS | Status: AC
  Administered 2022-03-14: 2 mg via INTRAVENOUS
  Filled 2022-03-14: qty 1

## 2022-03-14 MED ORDER — TRAZODONE HCL 50 MG PO TABS: 50.0000 mg | ORAL_TABLET | Freq: Every evening | ORAL | Status: DC | PRN

## 2022-03-14 MED ORDER — PIPERACILLIN-TAZOBACTAM 3.375 G IVPB
3.3750 g | Freq: Three times a day (TID) | INTRAVENOUS | Status: DC
  Administered 2022-03-14: 3.375 g via INTRAVENOUS
  Filled 2022-03-14: qty 50

## 2022-03-14 MED ORDER — SODIUM CHLORIDE 0.9 % IV SOLN: INTRAVENOUS | Status: DC

## 2022-03-14 MED ORDER — SODIUM CHLORIDE 0.9 % IV BOLUS
500.0000 mL | Freq: Once | INTRAVENOUS | Status: AC
  Administered 2022-03-14: 500 mL via INTRAVENOUS

## 2022-03-14 SURGICAL SUPPLY — 48 items
CUTTER FLEX LINEAR 45M (STAPLE) IMPLANT
DERMABOND ADVANCED .7 DNX12 (GAUZE/BANDAGES/DRESSINGS) ×1 IMPLANT
ELECT CAUTERY BLADE TIP 2.5 (TIP) ×1
ELECT REM PT RETURN 9FT ADLT (ELECTROSURGICAL) ×1
ELECTRODE CAUTERY BLDE TIP 2.5 (TIP) ×1 IMPLANT
ELECTRODE REM PT RTRN 9FT ADLT (ELECTROSURGICAL) ×1 IMPLANT
GLOVE SURG SYN 7.0 (GLOVE) ×1 IMPLANT
GLOVE SURG SYN 7.0 PF PI (GLOVE) ×1 IMPLANT
GLOVE SURG SYN 7.5  E (GLOVE) ×1
GLOVE SURG SYN 7.5 E (GLOVE) ×1 IMPLANT
GLOVE SURG SYN 7.5 PF PI (GLOVE) ×1 IMPLANT
GOWN STRL REUS W/ TWL LRG LVL3 (GOWN DISPOSABLE) ×2 IMPLANT
GOWN STRL REUS W/TWL LRG LVL3 (GOWN DISPOSABLE) ×2
IRRIGATION STRYKERFLOW (MISCELLANEOUS) IMPLANT
IRRIGATOR STRYKERFLOW (MISCELLANEOUS) ×1
IV NS 1000ML (IV SOLUTION) ×1
IV NS 1000ML BAXH (IV SOLUTION) ×1 IMPLANT
KIT TURNOVER KIT A (KITS) ×1 IMPLANT
LABEL OR SOLS (LABEL) ×1 IMPLANT
LIGASURE LAP MARYLAND 5MM 37CM (ELECTROSURGICAL) IMPLANT
MANIFOLD NEPTUNE II (INSTRUMENTS) ×1 IMPLANT
NEEDLE HYPO 22GX1.5 SAFETY (NEEDLE) ×1 IMPLANT
NS IRRIG 500ML POUR BTL (IV SOLUTION) ×1 IMPLANT
PACK LAP CHOLECYSTECTOMY (MISCELLANEOUS) ×1 IMPLANT
PENCIL SMOKE EVACUATOR (MISCELLANEOUS) IMPLANT
RELOAD 45 VASCULAR/THIN (ENDOMECHANICALS) IMPLANT
RELOAD STAPLE 45 2.5 WHT GRN (ENDOMECHANICALS) IMPLANT
RELOAD STAPLE 45 3.5 BLU ETS (ENDOMECHANICALS) ×1 IMPLANT
RELOAD STAPLE 45 3.6 BLU REG (STAPLE) IMPLANT
RELOAD STAPLE TA45 3.5 REG BLU (ENDOMECHANICALS) ×1 IMPLANT
SCISSORS METZENBAUM CVD 33 (INSTRUMENTS) ×1 IMPLANT
SLEEVE ADV FIXATION 5X100MM (TROCAR) ×1 IMPLANT
STAPLE RELOAD 45MM BLUE (STAPLE) IMPLANT
SUT MNCRL 4-0 (SUTURE) ×1
SUT MNCRL 4-0 27XMFL (SUTURE) ×1
SUT VIC AB 3-0 SH 27 (SUTURE) ×1
SUT VIC AB 3-0 SH 27X BRD (SUTURE) IMPLANT
SUT VICRYL 0 UR6 27IN ABS (SUTURE) ×1 IMPLANT
SUTURE MNCRL 4-0 27XMF (SUTURE) ×1 IMPLANT
SYS BAG RETRIEVAL 10MM (BASKET) ×1
SYS KII FIOS ACCESS ABD 5X100 (TROCAR) ×1
SYSTEM BAG RETRIEVAL 10MM (BASKET) ×1 IMPLANT
SYSTEM KII FIOS ACES ABD 5X100 (TROCAR) ×1 IMPLANT
TRAP FLUID SMOKE EVACUATOR (MISCELLANEOUS) ×1 IMPLANT
TRAY FOLEY MTR SLVR 16FR STAT (SET/KITS/TRAYS/PACK) ×1 IMPLANT
TROCAR BALLN GELPORT 12X130M (ENDOMECHANICALS) ×1 IMPLANT
TUBING EVAC SMOKE HEATED PNEUM (TUBING) ×1 IMPLANT
WATER STERILE IRR 500ML POUR (IV SOLUTION) ×1 IMPLANT

## 2022-03-14 NOTE — Transfer of Care (Signed)
Immediate Anesthesia Transfer of Care Note  Patient: Donna Malone  Procedure(s) Performed: APPENDECTOMY LAPAROSCOPIC (Abdomen)  Patient Location: PACU  Anesthesia Type:General  Level of Consciousness: drowsy and patient cooperative  Airway & Oxygen Therapy: Patient Spontanous Breathing and Patient connected to nasal cannula oxygen  Post-op Assessment: Report given to RN and Post -op Vital signs reviewed and stable  Post vital signs: Reviewed and stable  Last Vitals:  Vitals Value Taken Time  BP 124/49 03/14/22 1933  Temp    Pulse 78 03/14/22 1933  Resp 15 03/14/22 1933  SpO2 96 % 03/14/22 1933  Vitals shown include unvalidated device data.  Last Pain:  Vitals:   03/14/22 1743  TempSrc:   PainSc: 0-No pain         Complications: No notable events documented.

## 2022-03-14 NOTE — H&P (Signed)
[facility] SURGICAL HISTORY & PHYSICAL (cpt [phone])  HISTORY OF PRESENT ILLNESS (HPI):  76 y.o. female presented to [facility] ED overnight for abdominal pain. Patient reports the acute onset of abdominal pain last night around 9 PM. She felt initial discomfort around the umbilicus but this ultimately localized to the RLQ. This was sharp in nature. She got no relief from this. No fever, chills, cough, CP, SOB, nausea, emesis, or urinary changes. No history of similar. Previous intra-abdominal surgeries positive for laparoscopic cholecystectomy and abdominal hysterectomy. Work up in the ED revealed a mild leukocytosis to 13.8K, Hgb to 11.3, renal function consistent with known stage 4 CKD with sCr - 2.36, no electrolyte derangements. CT Abdomen/Pelvis was concerning for acute uncomplicated appendicitis.   General surgery is consulted by emergency medicine physician Dr Lurline Hare, MD for evaluation and management of acute uncomplicated appendicitis.   PAST MEDICAL HISTORY (PMH):  Past Medical History:  Diagnosis Date   Bone spur    heel   Depression    Diabetes mellitus without complication ([place])    Gout    Hypertension     Reviewed. Otherwise negative.   PAST SURGICAL HISTORY [facility]):  Past Surgical History:  Procedure Laterality Date   ABDOMINAL HYSTERECTOMY  04/1970   partial   CHOLECYSTECTOMY  late 1990's   COLONOSCOPY WITH PROPOFOL N/A 10/09/2021   Procedure: COLONOSCOPY WITH PROPOFOL;  Surgeon: Jonathon Bellows, MD;  Location: [facility] ENDOSCOPY;  Service: Gastroenterology;  Laterality: N/A;    Reviewed. Otherwise negative.   MEDICATIONS:  Prior to Admission medications   Medication Sig Start Date End Date Taking? Authorizing Provider  Accu-Chek Softclix Lancets lancets Use as instructed 01/17/22   Tally Joe T, [name]  acetaminophen (TYLENOL) 500 MG tablet Take 500 mg by mouth every 6 (six) hours as needed for mild pain or headache.    [provider]  allopurinol  (ZYLOPRIM) 100 MG tablet TAKE 1 TABLET BY MOUTH EVERY DAY 12/11/21   Tally Joe T, [name]  atorvastatin (LIPITOR) 40 MG tablet TAKE 1 TABLET BY MOUTH EVERY DAY 03/12/22   [name], [name]  B Complex Vitamins (GNP VITAMIN B COMPLEX PO) Take by mouth daily.     [provider]  benzonatate (TESSALON) 100 MG capsule Take 1 capsule (100 mg total) by mouth 2 (two) times daily as needed for cough. Patient not taking: Reported on 01/15/2022 12/29/21   Montine Circle, PA-C  blood glucose meter kit and supplies Dispense based on patient and insurance preference. Use once daily for FBG. (FOR ICD-10 E10.9, E11.9). 11/01/21   Tally Joe T, [name]  buPROPion (WELLBUTRIN XL) 150 MG 24 hr tablet TAKE 1 TABLET BY MOUTH EVERY DAY 03/12/22   Tally Joe T, [name]  escitalopram (LEXAPRO) 20 MG tablet TAKE 1 TABLET BY MOUTH EVERY DAY 03/12/22   [name], [name]  famotidine (PEPCID) 40 MG tablet TAKE 1 TABLET BY MOUTH EVERY DAY 05/24/21   Tally Joe T, [name]  ferrous sulfate 325 (65 FE) MG EC tablet Take 325 mg by mouth daily.    [provider]  glipiZIDE (GLUCOTROL XL) 10 MG 24 hr tablet TAKE 1 TABLET (10 MG TOTAL) BY MOUTH DAILY WITH BREAKFAST. 08/22/21   Tally Joe T, [name]  glucose blood (ACCU-CHEK GUIDE) test strip Use as instructed 01/17/22   [name], [name]  KRILL OIL PO Take by mouth.    [provider]  levothyroxine (SYNTHROID) 75 MCG tablet TAKE 1 TABLET BY MOUTH EVERY DAY  06/19/21   [name], [name]  MAGNESIUM-OXIDE PO Take by mouth.    [provider]  meclizine (ANTIVERT) 25 MG tablet Take 1-2 tablets (25-50 mg total) by mouth 3 (three) times daily as needed for dizziness. 11/29/21   [name], [name]  Multiple Vitamins-Minerals (PRESERVISION AREDS 2) CAPS Take 1 capsule by mouth in the morning and at bedtime.    [provider]  ondansetron (ZOFRAN) 4 MG tablet Take 1 tablet (4 mg total) by mouth every 8 (eight) hours as needed for nausea or  vomiting. Patient not taking: Reported on 01/15/2022 08/29/21   Jonathon Bellows, MD  Polyethyl Glycol-Propyl Glycol (SYSTANE HYDRATION PF OP) Place 1 drop into both eyes daily as needed (for dry eyes).    [provider]  traMADol (ULTRAM) 50 MG tablet Take 1 tablet (50 mg total) by mouth every 8 (eight) hours as needed. Avoid driving after taking pain medication. 01/26/22   Earlie Server, MD  traZODone (DESYREL) 50 MG tablet TAKE 1 TABLET BY MOUTH EVERYDAY AT BEDTIME 01/17/22   Tally Joe T, [name]  Vitamin D, Ergocalciferol, (DRISDOL) 1.25 MG (50000 UNIT) CAPS capsule TAKE 1 CAPSULE (50,000 UNITS TOTAL) BY MOUTH EVERY 7 (SEVEN) DAYS 02/09/22   [name], [name]     ALLERGIES:  Allergies  Allergen Reactions   Sulfa Antibiotics Other (See Comments)     SOCIAL HISTORY:  Social History   Socioeconomic History   Marital status: Widowed    Spouse name: [name]   Number of children: 3   Years of education: Not on file   Highest education level: 12th grade  Occupational History   Occupation: retired  Tobacco Use   Smoking status: Never   Smokeless tobacco: Never  Vaping Use   Vaping Use: Never used  Substance and Sexual Activity   Alcohol use: No   Drug use: No   Sexual activity: Not on file  Other Topics Concern   Not on file  Social History Narrative   Not on file   Social Determinants of Health   Financial Resource Strain: Low Risk  (06/01/2021)   Overall Financial Resource Strain (CARDIA)    Difficulty of Paying Living Expenses: Not hard at all  Food Insecurity: No Food Insecurity (04/27/2021)   Hunger Vital Sign    Worried About Running Out of Food in the Last Year: Never true    [place] in the Last Year: Never true  Transportation Needs: No Transportation Needs (04/27/2021)   PRAPARE - Hydrologist (Medical): No    Lack of Transportation (Non-Medical): No  Physical Activity: Inactive (04/27/2021)   Exercise Vital Sign    Days of  Exercise per Week: 0 days    Minutes of Exercise per Session: 0 min  Stress: No Stress Concern Present (04/27/2021)   [place]    Feeling of Stress : Only a little  Social Connections: Moderately Isolated (04/27/2021)   Social Connection and Isolation Panel [NHANES]    Frequency of Communication with Friends and Family: More than three times a week    Frequency of Social Gatherings with Friends and Family: Twice a week    Attends Religious Services: 1 to 4 times per year    Active Member of [employer] or Organizations: No    Attends Archivist Meetings: Never    Marital Status: Widowed  Intimate Partner Violence: Not At Risk (04/27/2021)  Humiliation, Afraid, Rape, and Kick questionnaire    Fear of Current or Ex-Partner: No    Emotionally Abused: No    Physically Abused: No    Sexually Abused: No     FAMILY HISTORY:  Family History  Problem Relation Age of Onset   CAD Mother    Heart attack Mother    Heart disease Father    Diabetes Cousin    Cancer Cousin     Otherwise negative.   REVIEW OF SYSTEMS:  Review of Systems  Constitutional:  Negative for chills and fever.  Respiratory:  Negative for cough and shortness of breath.   Cardiovascular:  Negative for palpitations.  Gastrointestinal:  Positive for abdominal pain. Negative for constipation, diarrhea, nausea and vomiting.  Genitourinary:  Negative for dysuria and urgency.  All other systems reviewed and are negative.   VITAL SIGNS:  Temp:  [98.5 F (36.9 C)-99.6 F (37.6 C)] 98.6 F (37 C) (12/13 0729) Pulse Rate:  [68-83] 68 (12/13 0729) Resp:  [12-22] 20 (12/13 0729) BP: (112-150)/(48-102) 114/50 (12/13 0729) SpO2:  [91 %-100 %] 98 % (12/13 0729) Weight:  [79.4 kg] 79.4 kg (12/12 2326)     Height: _0  (160 cm) Weight: 79.4 kg BMI (Calculated): 31.01   PHYSICAL EXAM:  Physical Exam Vitals and nursing note reviewed. Exam conducted with a  chaperone present.  Constitutional:      General: She is not in acute distress.    Appearance: She is well-developed and normal weight. She is not ill-appearing.     Comments: Patient resting in bed, NAD. Family at bedside   HENT:     Head: Normocephalic and atraumatic.  Eyes:     General: No scleral icterus.    Extraocular Movements: Extraocular movements intact.  Cardiovascular:     Rate and Rhythm: Normal rate and regular rhythm.  Pulmonary:     Effort: Pulmonary effort is normal. No respiratory distress.  Abdominal:     General: A surgical scar is present. There is no distension.     Palpations: Abdomen is soft.     Tenderness: There is abdominal tenderness in the right lower quadrant. There is no guarding or rebound. Positive signs include McBurney's sign.     Comments: Abdomen is soft, she is tender in RLQ with positive McBurney's point, non-distended, no rebound/guarding   Genitourinary:    Comments: Deferred Skin:    General: Skin is warm and dry.     Coloration: Skin is not jaundiced or pale.  Neurological:     General: No focal deficit present.     Mental Status: She is alert and oriented to person, place, and time.  Psychiatric:        Mood and Affect: Mood normal.        Behavior: Behavior normal.     INTAKE/OUTPUT:  This shift: No intake/output data recorded.  Last 2 shifts: _1 @  Labs:     Latest Ref Rng & Units 03/13/2022   11:30 PM 03/09/2022   11:21 AM 01/04/2022   11:07 AM  CBC  WBC 4.0 - 10.5 K/uL 13.8  8.8  9.0   Hemoglobin 12.0 - 15.0 g/dL 11.5  11.3  11.3   Hematocrit 36.0 - 46.0 % 37.0  34.8  33.9   Platelets 150 - 400 K/uL 234  238  308       Latest Ref Rng & Units 03/14/2022    2:53 AM 03/09/2022   11:24 AM 01/04/2022   11:07 AM  CMP  Glucose 70 - 99 mg/dL 157  156  157   BUN 8 - 23 mg/dL 36  40  40   Creatinine 0.44 - 1.00 mg/dL 2.36  2.38  2.51   Sodium 135 - 145 mmol/L 138  138  136   Potassium 3.5 - 5.1 mmol/L 4.4  4.0   5.0   Chloride 98 - 111 mmol/L 112  115  108   CO2 22 - 32 mmol/L _0 Calcium 8.9 - 10.3 mg/dL 9.7  9.6  9.0   Total Protein 6.5 - 8.1 g/dL 7.1   6.8   Total Bilirubin 0.3 - 1.2 mg/dL 0.8   0.5   Alkaline Phos 38 - 126 U/L 100   118   AST 15 - 41 U/L 23   21   ALT 0 - 44 U/L 19   17      Imaging studies:   CT Abdomen/Pelvis (03/14/2022) personally reviewed showing inflamed and dilated appendix, + appendicolith, no free air, no abscess, and radiologist report reviewed below:  IMPRESSION: 1. Acute, complicated appendicitis.  No evidence of perforation. 2. Severe distal colonic diverticulosis without superimposed inflammatory change. 3. Aortic atherosclerosis.    Assessment/Plan: (ICD-10's: K48.80) 76 y.o. female with acute uncomplicated appendicitis, complicated by pertinent comorbidities including CKD, DM.    - Admit to general surgery  - Plan for laparoscopic appendectomy this afternoon with Dr Hampton Abbot pending OR/anesthesia availability  - All risks, benefits, and alternatives to above procedure(s) were discussed with the patient and her family, all of their questions were answered to their expressed satisfaction, patient expresses she wishes to proceed, and informed consent was obtained.   - NPO for planned procedure + IVF resuscitation - IV Abx (zosyn) - Monitor abdominal examination - Pain control prn; antiemetics prn   - Monitor leukocytosis - Okay to mobilize - SSI   - DVT prophylaxis  All of the above findings and recommendations were discussed with the patient and her family, and all of their questions were answered to their expressed satisfaction.  -- Edison Simon, PA-C [facility] 03/14/2022, 8:24 AM M-F: 7am - 4pm

## 2022-03-14 NOTE — Anesthesia Postprocedure Evaluation (Signed)
Anesthesia Post Note  Patient: Donna Malone  Procedure(s) Performed: APPENDECTOMY LAPAROSCOPIC (Abdomen)  Patient location during evaluation: PACU Anesthesia Type: General Level of consciousness: awake and alert Pain management: pain level controlled Vital Signs Assessment: post-procedure vital signs reviewed and stable Respiratory status: spontaneous breathing, nonlabored ventilation, respiratory function stable and patient connected to nasal cannula oxygen Cardiovascular status: blood pressure returned to baseline and stable Postop Assessment: no apparent nausea or vomiting Anesthetic complications: no   No notable events documented.   Last Vitals:  Vitals:   03/14/22 2000 03/14/22 2015  BP: (!) 110/44   Pulse: 68 67  Resp: 17 (!) 9  Temp:    SpO2: 96% 99%    Last Pain:  Vitals:   03/14/22 1945  TempSrc:   PainSc: [place]

## 2022-03-14 NOTE — ED Notes (Signed)
Pt undressed and placed into hospital gown. Non-sip socks provided. Pt ambulate to and from the bathroom with RN stand by. Gait stable. Urine sample collected and sent. IV zofran started. Jewelry (necklace, three rings, and bracelet) removed and placed in specimen cup. Belongings given to grand-daughter at bedside.

## 2022-03-14 NOTE — ED Provider Notes (Signed)
[facility] Provider Note    Event Date/Time   First MD Initiated Contact with Patient 03/14/22 [phone]     (approximate)   History   Abdominal Pain   HPI  Donna Malone is a 76 y.o. female  who present to the ED from home with a chief complaint of abdominal pain. Onset around 9pm, sharp, nonradiating to right lower quadrant. No associated fever/chills, nausea, vomiting, dysuria or diarrhea. Endorses diarrhea last week. Denies chest pain, sob, dizziness.       Past Medical History   Past Medical History:  Diagnosis Date   Bone spur    heel   Depression    Diabetes mellitus without complication ([place])    Gout    Hypertension      Active Problem List   Patient Active Problem List   Diagnosis Date Noted   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   Positive colorectal cancer screening using Cologuard test 08/29/2021   Mild episode of recurrent major depressive disorder ([place]) 08/02/2021   Yeast infection of the skin 08/02/2021   Panniculitis 08/02/2021   Hypotension 08/02/2021   Stage 4 chronic kidney disease ([place]) 07/28/2021   Goals of care, counseling/discussion 07/13/2021   Multiple myeloma ([place]) 07/13/2021   Bone lesion 07/13/2021   Stage 3b chronic kidney disease ([place]) 07/13/2021   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/21/2021   Depression, recurrent ([place]) 06/21/2021   Type 2 diabetes mellitus with stage 4 chronic kidney disease, with long-term current use of insulin ([place]) 05/05/2021   Positive screening for depression on 2-item Patient Health Questionnaire (PHQ-2) 05/04/2021   Severe episode of recurrent major depressive disorder, without psychotic features ([place]) 05/04/2021   Psychophysiological insomnia 05/04/2021   Diabetes mellitus due to underlying condition with stage 4 chronic kidney disease, with long-term current use of insulin ([place]) 05/04/2021    Hyperlipidemia associated with type 2 diabetes mellitus ([place]) 05/04/2021   Need for influenza vaccination 05/04/2021   Need for shingles vaccine 05/04/2021   Need for tetanus booster 05/04/2021   Right arm pain 10/22/2019   Degenerative arthritis of knee, bilateral 05/13/2018   Hypertension associated with diabetes ([place]) 04/01/2015   Leukocytosis 12/22/2014   Abnormal finding on mammography 09/21/2014   Obesity 09/21/2014   Chronic kidney disease, stage III (moderate) ([place]) 09/21/2014   Accumulation of fluid in tissues 09/21/2014   Esophageal reflux 09/21/2014   Gout 09/21/2014   Hypercholesteremia 09/21/2014   Insomnia 09/21/2014   Vitamin D deficiency, unspecified 09/21/2014   Hypothyroidism 01/15/2014   Depressive disorder 01/15/2014   Low back pain 01/15/2014   Other chest pain 01/15/2014     Past Surgical History   Past Surgical History:  Procedure Laterality Date   ABDOMINAL HYSTERECTOMY  04/1970   partial   CHOLECYSTECTOMY  late 1990's   COLONOSCOPY WITH PROPOFOL N/A 10/09/2021   Procedure: COLONOSCOPY WITH PROPOFOL;  Surgeon: Jonathon Bellows, MD;  Location: [facility] ENDOSCOPY;  Service: Gastroenterology;  Laterality: N/A;     Home Medications   Prior to Admission medications   Medication Sig Start Date End Date Taking? Authorizing Provider  Accu-Chek Softclix Lancets lancets Use as instructed 01/17/22   Tally Joe T, [name]  acetaminophen (TYLENOL) 500 MG tablet Take 500 mg by mouth every 6 (six) hours as needed for mild pain or headache.    [provider]  allopurinol (ZYLOPRIM) 100 MG tablet TAKE 1 TABLET BY MOUTH EVERY DAY 12/11/21   Rollene Rotunda,  Jaci Standard, [name]  atorvastatin (LIPITOR) 40 MG tablet TAKE 1 TABLET BY MOUTH EVERY DAY 03/12/22   [name], [name]  B Complex Vitamins (GNP VITAMIN B COMPLEX PO) Take by mouth daily.     [provider]  benzonatate (TESSALON) 100 MG capsule Take 1 capsule (100 mg total) by mouth 2 (two) times daily as needed for  cough. Patient not taking: Reported on 01/15/2022 12/29/21   Montine Circle, PA-C  blood glucose meter kit and supplies Dispense based on patient and insurance preference. Use once daily for FBG. (FOR ICD-10 E10.9, E11.9). 11/01/21   Tally Joe T, [name]  buPROPion (WELLBUTRIN XL) 150 MG 24 hr tablet TAKE 1 TABLET BY MOUTH EVERY DAY 03/12/22   Tally Joe T, [name]  escitalopram (LEXAPRO) 20 MG tablet TAKE 1 TABLET BY MOUTH EVERY DAY 03/12/22   [name], [name]  famotidine (PEPCID) 40 MG tablet TAKE 1 TABLET BY MOUTH EVERY DAY 05/24/21   Tally Joe T, [name]  ferrous sulfate 325 (65 FE) MG EC tablet Take 325 mg by mouth daily.    [provider]  glipiZIDE (GLUCOTROL XL) 10 MG 24 hr tablet TAKE 1 TABLET (10 MG TOTAL) BY MOUTH DAILY WITH BREAKFAST. 08/22/21   Tally Joe T, [name]  glucose blood (ACCU-CHEK GUIDE) test strip Use as instructed 01/17/22   [name], [name]  KRILL OIL PO Take by mouth.    [provider]  levothyroxine (SYNTHROID) 75 MCG tablet TAKE 1 TABLET BY MOUTH EVERY DAY 06/19/21   [name], [name]  MAGNESIUM-OXIDE PO Take by mouth.    [provider]  meclizine (ANTIVERT) 25 MG tablet Take 1-2 tablets (25-50 mg total) by mouth 3 (three) times daily as needed for dizziness. 11/29/21   [name], [name]  Multiple Vitamins-Minerals (PRESERVISION AREDS 2) CAPS Take 1 capsule by mouth in the morning and at bedtime.    [provider]  ondansetron (ZOFRAN) 4 MG tablet Take 1 tablet (4 mg total) by mouth every 8 (eight) hours as needed for nausea or vomiting. Patient not taking: Reported on 01/15/2022 08/29/21   Jonathon Bellows, MD  Polyethyl Glycol-Propyl Glycol (SYSTANE HYDRATION PF OP) Place 1 drop into both eyes daily as needed (for dry eyes).    [provider]  traMADol (ULTRAM) 50 MG tablet Take 1 tablet (50 mg total) by mouth every 8 (eight) hours as needed. Avoid driving after taking pain medication. 01/26/22   Earlie Server, MD  traZODone  (DESYREL) 50 MG tablet TAKE 1 TABLET BY MOUTH EVERYDAY AT BEDTIME 01/17/22   Tally Joe T, [name]  Vitamin D, Ergocalciferol, (DRISDOL) 1.25 MG (50000 UNIT) CAPS capsule TAKE 1 CAPSULE (50,000 UNITS TOTAL) BY MOUTH EVERY 7 (SEVEN) DAYS 02/09/22   [name], [name]     Allergies  Sulfa antibiotics   Family History   Family History  Problem Relation Age of Onset   CAD Mother    Heart attack Mother    Heart disease Father    Diabetes Cousin    Cancer Cousin      Physical Exam  Triage Vital Signs: ED Triage Vitals  Enc Vitals Group     BP 03/13/22 2325 (!) 118/102     Pulse Rate 03/13/22 2325 74     Resp 03/13/22 2325 16     Temp 03/13/22 2325 98.8 F (37.1 C)     Temp Source 03/13/22 2325 Oral     SpO2 03/13/22 2325 100 %  Weight 03/13/22 2326 175 lb (79.4 kg)     Height 03/13/22 2326 _0  (1.6 m)     Head Circumference --      Peak Flow --      Pain Score 03/13/22 2325 8     Pain Loc --      Pain Edu? --      Excl. in [place]? --     Updated Vital Signs: BP (!) 115/48   Pulse 69   Temp 98.5 F (36.9 C) (Oral)   Resp 16   Ht _1  (1.6 m)   Wt 79.4 kg   SpO2 91%   BMI 31.00 kg/m    General: Awake, mild distress.  CV:  RRR. Good peripheral perfusion.  Resp:  Normal effort. CTAB. Abd:  Moderately tender to palpation right lower quadrant without rebound or guarding. No distention.  Other:  No truncal vesicles.   ED Results / Procedures / Treatments  Labs (all labs ordered are listed, but only abnormal results are displayed) Labs Reviewed  CBC - Abnormal; Notable for the following components:      Result Value   WBC 13.8 (*)    RBC 3.44 (*)    Hemoglobin 11.5 (*)    MCV 107.6 (*)    All other components within normal limits  URINALYSIS, ROUTINE W REFLEX MICROSCOPIC - Abnormal; Notable for the following components:   Color, Urine YELLOW (*)    APPearance CLOUDY (*)    Leukocytes,Ua LARGE (*)    WBC, UA >50 (*)    Bacteria, UA RARE (*)    All  other components within normal limits  COMPREHENSIVE METABOLIC PANEL - Abnormal; Notable for the following components:   Chloride 112 (*)    CO2 19 (*)    Glucose, Bld 157 (*)    BUN 36 (*)    Creatinine, Ser 2.36 (*)    GFR, Estimated 21 (*)    All other components within normal limits  LIPASE, BLOOD     EKG  None   RADIOLOGY I have independently visualized and interpreted patient's CT scan as well as noted the radiology interpretation:  CT Abd/pel: Acute appendicitis with inflammatory stranding; no perforation  Official radiology report(s): CT ABDOMEN PELVIS WO CONTRAST  Result Date: 03/14/2022 CLINICAL DATA:  Right lower quadrant abdominal pain EXAM: CT ABDOMEN AND PELVIS WITHOUT CONTRAST TECHNIQUE: Multidetector CT imaging of the abdomen and pelvis was performed following the standard protocol without IV contrast. RADIATION DOSE REDUCTION: This exam was performed according to the departmental dose-optimization program which includes automated exposure control, adjustment of the mA and/or kV according to patient size and/or use of iterative reconstruction technique. COMPARISON:  None Available. FINDINGS: Lower chest: No acute abnormality. Hepatobiliary: No focal liver abnormality is seen. Status post cholecystectomy. No biliary dilatation. Pancreas: Unremarkable Spleen: Unremarkable Adrenals/Urinary Tract: The adrenal glands are unremarkable. The kidneys are normal in position. Mild bilateral renal cortical atrophy. The kidneys are otherwise unremarkable on this noncontrast examination. The bladder is unremarkable. Stomach/Bowel: Small hiatal hernia. Gastric diverticulum noted. The appendix is dilated and there is extensive periappendiceal inflammatory stranding in keeping with changes of acute appendicitis. Several punctate 2-3 mm appendicoliths are seen within the appendiceal lumen. No evidence of obstruction. No periappendiceal fluid collection identified. No free intraperitoneal gas  or fluid. Severe descending and sigmoid colonic diverticulosis without superimposed inflammatory change. Stomach, small bowel, and large bowel are otherwise unremarkable. Vascular/Lymphatic: Aortic atherosclerosis. No enlarged abdominal or pelvic lymph nodes. Reproductive: Status post  hysterectomy. No adnexal masses. Other: Tiny fat containing umbilical hernia Musculoskeletal: Osseous structures are age-appropriate. No acute bone abnormality. IMPRESSION: 1. Acute, complicated appendicitis.  No evidence of perforation. 2. Severe distal colonic diverticulosis without superimposed inflammatory change. 3. Aortic atherosclerosis. Aortic Atherosclerosis (ICD10-I70.0). Electronically Signed   By: Fidela Salisbury M.D.   On: 03/14/2022 04:23     PROCEDURES:  Critical Care performed: No  .1-3 Lead EKG Interpretation  Performed by: Paulette Blanch, MD Authorized by: Paulette Blanch, MD     Interpretation: normal     ECG rate:  80   ECG rate assessment: normal     Rhythm: sinus rhythm     Ectopy: none     Conduction: normal   Comments:     Patient placed on cardiac monitor to evaluate for arrthymias    MEDICATIONS ORDERED IN ED: Medications  sodium chloride 0.9 % bolus 500 mL (0 mLs Intravenous Stopped 03/14/22 0514)  ondansetron (ZOFRAN) injection 4 mg (4 mg Intravenous Given 03/14/22 0353)  morphine (PF) 2 MG/ML injection 2 mg (2 mg Intravenous Given 03/14/22 0354)  piperacillin-tazobactam (ZOSYN) IVPB 3.375 g (0 g Intravenous Stopped 03/14/22 0559)     IMPRESSION / MDM / ASSESSMENT AND PLAN / ED COURSE  I reviewed the triage vital signs and the nursing notes.                             76 year old female presenting with abdominal pain. Differential diagnosis includes, but is not limited to, ovarian cyst, ovarian torsion, acute appendicitis, diverticulitis, urinary tract infection/pyelonephritis, endometriosis, bowel obstruction, colitis, renal colic, gastroenteritis, hernia, etc. I have  personally reviewed patient's records and note her oncology office visit for multiple myeloma on 01/15/2022.  Patient's presentation is most consistent with acute presentation with potential threat to life or bodily function.  The patient is on the cardiac monitor to evaluate for evidence of arrhythmia and/or significant heart rate changes.  Laboratory results demonstrate mild leukocytosis WBC 13.8, stable renal function Creatinine 2.36. Awaiting UA. Will obtain CT abd/pel with oral contrast only to evaluate for appendicitis. Keep NPO, initiate IV fluid hydration, IV Morphine for pain paired with IV Zofran for nausea. Will reassess.  Clinical Course as of 03/14/22 8864  Wed Mar 14, 2022  0454 Updated patient and family members of CT results of acute appendicitis.  Spoke with surgery on-call Dr. Dahlia Byes who will evaluate patient in the ED.  Did recommend dose of antibiotics now.  Will administer IV Zosyn. [JS]    Clinical Course User Index [JS] Paulette Blanch, MD     FINAL CLINICAL IMPRESSION(S) / ED DIAGNOSES   Final diagnoses:  Right lower quadrant abdominal pain  Lower urinary tract infectious disease  Acute appendicitis, unspecified acute appendicitis type     Rx / DC Orders   ED Discharge Orders     None        Note:  This document was prepared using Dragon voice recognition software and may include unintentional dictation errors.   Paulette Blanch, MD 03/14/22 [phone]

## 2022-03-14 NOTE — Anesthesia Preprocedure Evaluation (Signed)
Anesthesia Evaluation  Patient identified by MRN, date of birth, ID band Patient awake    Reviewed: Allergy & Precautions, NPO status , Patient's Chart, lab work & pertinent test results  History of Anesthesia Complications Negative for: history of anesthetic complications  Airway Mallampati: III  TM Distance: >3 FB Neck ROM: full    Dental  (+) Chipped, Poor Dentition, Missing, Upper Dentures   Pulmonary neg pulmonary ROS, neg shortness of breath   Pulmonary exam normal        Cardiovascular Exercise Tolerance: Good hypertension, (-) angina (-) DOE Normal cardiovascular exam     Neuro/Psych  PSYCHIATRIC DISORDERS  Depression    negative neurological ROS     GI/Hepatic Neg liver ROS,GERD  Controlled,,  Endo/Other  diabetes, Type 2Hypothyroidism    Renal/GU Renal disease     Musculoskeletal  (+) Arthritis ,    Abdominal   Peds  Hematology negative hematology ROS (+)   Anesthesia Other Findings Past Medical History: No date: Bone spur     Comment:  heel No date: Depression No date: Diabetes mellitus without complication (HCC) No date: Gout No date: Hypertension  Past Surgical History: 04/1970: ABDOMINAL HYSTERECTOMY     Comment:  partial late 1990's: CHOLECYSTECTOMY 10/09/2021: COLONOSCOPY WITH PROPOFOL; N/A     Comment:  Procedure: COLONOSCOPY WITH PROPOFOL;  Surgeon: Jonathon Bellows, MD;  Location: [facility] ENDOSCOPY;  Service:               Gastroenterology;  Laterality: N/A;  BMI    Body Mass Index: 31.01 kg/m      Reproductive/Obstetrics negative OB ROS                             Anesthesia Physical Anesthesia Plan  ASA: 3  Anesthesia Plan: General ETT   Post-op Pain Management:    Induction: Intravenous  PONV Risk Score and Plan: Ondansetron, Dexamethasone, Midazolam and Treatment may vary due to age or medical condition  Airway Management Planned: Oral  ETT  Additional Equipment:   Intra-op Plan:   Post-operative Plan: Extubation in OR  Informed Consent: I have reviewed the patients History and Physical, chart, labs and discussed the procedure including the risks, benefits and alternatives for the proposed anesthesia with the patient or authorized representative who has indicated his/her understanding and acceptance.     Dental Advisory Given  Plan Discussed with: Anesthesiologist, CRNA and Surgeon  Anesthesia Plan Comments: (Patient consented for risks of anesthesia including but not limited to:  - adverse reactions to medications - damage to eyes, teeth, lips or other oral mucosa - nerve damage due to positioning  - sore throat or hoarseness - Damage to heart, brain, nerves, lungs, other parts of body or loss of life  Patient voiced understanding.)       Anesthesia Quick Evaluation

## 2022-03-14 NOTE — Progress Notes (Signed)
PHARMACY NOTE:  ANTIMICROBIAL RENAL DOSAGE ADJUSTMENT  Current antimicrobial regimen includes a mismatch between antimicrobial dosage and estimated renal function.  As per policy approved by the Pharmacy & Therapeutics and Medical Executive Committees, the antimicrobial dosage will be adjusted accordingly.  Current antimicrobial dosage: Zosyn 3.375 g IV q8h (4-hr infusion)  Indication: Acute appendicitis  Renal Function:  Estimated Creatinine Clearance: 19.6 mL/min (A) (by C-G formula based on SCr of 2.36 mg/dL (H)).    Antimicrobial dosage has been changed to:  Zosyn 2.25 g IV q8h (30-minute infusion)  Additional comments: Borderline renal dose adjustment. Normalized CrCl 22 mL/min. Pending laparoscopic appendectomy. Will continue to monitor.   Thank you for allowing pharmacy to be a part of this patient's care.  Benita Gutter, [facility] 03/14/2022 4:14 PM

## 2022-03-14 NOTE — ED Notes (Signed)
Patient transported to CT 

## 2022-03-14 NOTE — Anesthesia Procedure Notes (Signed)
Procedure Name: Intubation Date/Time: 03/07/2022 6:02 PM  Performed by: Beverely Low, CRNAPre-anesthesia Checklist: Patient identified, Patient being monitored, Timeout performed, Emergency Drugs available and Suction available Patient Re-evaluated:Patient Re-evaluated prior to induction Oxygen Delivery Method: Circle system utilized Preoxygenation: Pre-oxygenation with 100% oxygen Induction Type: IV induction Ventilation: Mask ventilation without difficulty Laryngoscope Size: 3 and McGraph Grade View: Grade I Tube type: Oral Tube size: 7.0 mm Number of attempts: 1 Airway Equipment and Method: Stylet Placement Confirmation: ETT inserted through vocal cords under direct vision, positive ETCO2 and breath sounds checked- equal and bilateral Secured at: 21 cm Tube secured with: Tape Dental Injury: Teeth and Oropharynx as per pre-operative assessment

## 2022-03-14 NOTE — Op Note (Signed)
  Procedure Date:  03/14/2022  Pre-operative Diagnosis:  Acute appendicitis  Post-operative Diagnosis: Acute appendicitis  Procedure:  Laparoscopic appendectomy  Surgeon:  Melvyn Neth, MD  Anesthesia:  General endotracheal  Estimated Blood Loss:  10 ml  Specimens:  appendix  Complications:  None  Indications for Procedure:  This is a 76 y.o. female who presents with abdominal pain and workup revealing acute appendicitis.  The options of surgery versus observation were reviewed with the patient and/or family. The risks of bleeding, infection, recurrence of symptoms, negative laparoscopy, potential for an open procedure, bowel injury, abscess or infection, were all discussed with the patient and she was willing to proceed.  Description of Procedure: The patient was correctly identified in the preoperative area and brought into the operating room.  The patient was placed supine with VTE prophylaxis in place.  Appropriate time-outs were performed.  Anesthesia was induced and the patient was intubated.  Foley catheter was placed.  Appropriate antibiotics were infused.  The abdomen was prepped and draped in a sterile fashion. An infraumbilical incision was made. A cutdown technique was used to enter the abdominal cavity without injury, and a Hasson trocar was inserted.  Pneumoperitoneum was obtained with appropriate opening pressures.  Two 5-mm ports were placed in the suprapubic and left lateral positions under direct visualization.  The right lower quadrant was inspected and the appendix was identified and found to be acutely inflamed and distended, without perforation.  The appendix was carefully dissected.  The mesoappendix was divided using the LigaSure.  The base of the appendix was dissected out and divided with a standard load Endo GIA.  The appendix was placed in an Endocatch bag.  The right lower quadrant was then inspected again revealing an intact staple line, no bleeding, and no  bowel injury.  The area was thoroughly irrigated.  The 5 mm ports were removed under direct visualization and the Hasson trocar was removed.  The Endocatch bag was brought out through the umbilical incision.  The fascial opening was closed using 0 vicryl suture.  Local anesthetic was infused in all incisions and the incisions were closed with 4-0 Monocryl.  The wounds were cleaned and sealed with DermaBond.  Foley catheter was removed and the patient was emerged from anesthesia and extubated and brought to the recovery room for further management.  The patient tolerated the procedure well and all counts were correct at the end of the case.   Melvyn Neth, MD

## 2022-03-15 ENCOUNTER — Encounter: Payer: Self-pay | Admitting: Surgery

## 2022-03-15 ENCOUNTER — Other Ambulatory Visit: Payer: Self-pay | Admitting: Family Medicine

## 2022-03-15 DIAGNOSIS — N1832 Chronic kidney disease, stage 3b: Secondary | ICD-10-CM

## 2022-03-15 LAB — GLUCOSE, CAPILLARY
Glucose-Capillary: 173 mg/dL — ABNORMAL HIGH (ref 70–99)
Glucose-Capillary: 178 mg/dL — ABNORMAL HIGH (ref 70–99)
Glucose-Capillary: 176 mg/dL — ABNORMAL HIGH (ref 70–99)
Glucose-Capillary: 176 mg/dL — ABNORMAL HIGH (ref 70–99)

## 2022-03-15 MED ORDER — TRAMADOL HCL 50 MG PO TABS: 50.0000 mg | ORAL_TABLET | Freq: Four times a day (QID) | ORAL | Status: DC | PRN

## 2022-03-15 MED ORDER — INSULIN ASPART 100 UNIT/ML IJ SOLN: 0.0000 [IU] | Freq: Every day | INTRAMUSCULAR | Status: DC

## 2022-03-15 MED ORDER — ATORVASTATIN CALCIUM 20 MG PO TABS
ORAL_TABLET | ORAL | Status: AC
  Filled 2022-03-15: qty 1

## 2022-03-15 MED ORDER — INSULIN ASPART 100 UNIT/ML IJ SOLN
0.0000 [IU] | Freq: Three times a day (TID) | INTRAMUSCULAR | Status: DC
  Administered 2022-03-15 (×2): 3 [IU] via SUBCUTANEOUS

## 2022-03-15 MED ORDER — INSULIN ASPART 100 UNIT/ML IJ SOLN
INTRAMUSCULAR | Status: AC
  Filled 2022-03-15: qty 1

## 2022-03-15 MED ORDER — TRAMADOL HCL 50 MG PO TABS: 50.0000 mg | ORAL_TABLET | Freq: Four times a day (QID) | ORAL | 0 refills | Status: DC | PRN

## 2022-03-15 MED ORDER — AMOXICILLIN-POT CLAVULANATE 875-125 MG PO TABS: 1.0000 | ORAL_TABLET | Freq: Two times a day (BID) | ORAL | 0 refills | Status: AC

## 2022-03-15 MED ORDER — ACETAMINOPHEN 500 MG PO TABS: 1000.0000 mg | ORAL_TABLET | Freq: Four times a day (QID) | ORAL | Status: DC | PRN

## 2022-03-15 MED ORDER — FERROUS SULFATE 325 (65 FE) MG PO TABS
ORAL_TABLET | ORAL | Status: AC
  Filled 2022-03-15: qty 1

## 2022-03-15 MED ORDER — GLIPIZIDE ER 10 MG PO TB24: 10.0000 mg | ORAL_TABLET | Freq: Every day | ORAL | 0 refills | Status: DC

## 2022-03-15 NOTE — Discharge Summary (Signed)
[facility] SURGICAL DISCHARGE SUMMARY  Patient ID: Donna Malone MRN: [id number] DOB/AGE: 78-Mar-1947 78 y.o.  Admit date: 03/14/2022 Discharge date: 03/15/2022  Discharge Diagnoses Patient Active Problem List   Diagnosis Date Noted   Appendicitis 03/14/2022    Consultants None  Procedures 03/14/2022: Laparoscopic Appendectomy   HPI: 76 y.o. female presented to [facility] ED overnight for abdominal pain. Patient reports the acute onset of abdominal pain last night around 9 PM. She felt initial discomfort around the umbilicus but this ultimately localized to the RLQ. This was sharp in nature. She got no relief from this. No fever, chills, cough, CP, SOB, nausea, emesis, or urinary changes. No history of similar. Previous intra-abdominal surgeries positive for laparoscopic cholecystectomy and abdominal hysterectomy. Work up in the ED revealed a mild leukocytosis to 13.8K, Hgb to 11.3, renal function consistent with known stage 4 CKD with sCr - 2.36, no electrolyte derangements. CT Abdomen/Pelvis was concerning for acute uncomplicated appendicitis.   Hospital Course: Informed consent was obtained and documented, and patient underwent uneventful laparoscopic appendectomy (Dr Hampton Abbot, 03/14/2022).  Post-operatively, patient's pain/symptoms improved/resolved and advancement of patient's diet and ambulation were well-tolerated. The remainder of patient's hospital course was essentially unremarkable, and discharge planning was initiated accordingly with patient safely able to be discharged home with appropriate discharge instructions, antibiotics (Augmentin x7 days), pain control, and outpatient follow-up after all of her questions were answered to her expressed satisfaction.   Discharge Condition: Good   Physical Examination:  Constitutional: Well appearing female, NAD Pulmonary: Normal effort, no respiratory distress Gastrointestinal: Soft, incisional soreness,  non-distended, no rebound/guarding Skin: Laparoscopic incisions are CDI with dermabond    Allergies as of 03/15/2022       Reactions   Sulfa Antibiotics Other (See Comments)        Medication List     TAKE these medications    Accu-Chek Guide test strip Generic drug: glucose blood Use as instructed   Accu-Chek Softclix Lancets lancets Use as instructed   acetaminophen 500 MG tablet Commonly known as: TYLENOL Take 500 mg by mouth every 6 (six) hours as needed for mild pain or headache. What changed: Another medication with the same name was added. Make sure you understand how and when to take each.   acetaminophen 500 MG tablet Commonly known as: TYLENOL Take 2 tablets (1,000 mg total) by mouth every 6 (six) hours as needed for mild pain. What changed: You were already taking a medication with the same name, and this prescription was added. Make sure you understand how and when to take each.   allopurinol 100 MG tablet Commonly known as: ZYLOPRIM TAKE 1 TABLET BY MOUTH EVERY DAY   amoxicillin-clavulanate 875-125 MG tablet Commonly known as: AUGMENTIN Take 1 tablet by mouth 2 (two) times daily for 7 days.   atorvastatin 40 MG tablet Commonly known as: LIPITOR TAKE 1 TABLET BY MOUTH EVERY DAY   blood glucose meter kit and supplies Dispense based on patient and insurance preference. Use once daily for FBG. (FOR ICD-10 E10.9, E11.9).   buPROPion 150 MG 24 hr tablet Commonly known as: WELLBUTRIN XL TAKE 1 TABLET BY MOUTH EVERY DAY   escitalopram 20 MG tablet Commonly known as: LEXAPRO TAKE 1 TABLET BY MOUTH EVERY DAY   famotidine 40 MG tablet Commonly known as: PEPCID TAKE 1 TABLET BY MOUTH EVERY DAY   ferrous sulfate 325 (65 FE) MG EC tablet Take 325 mg by mouth daily.   glipiZIDE 10 MG 24 hr tablet Commonly  known as: GLUCOTROL XL TAKE 1 TABLET (10 MG TOTAL) BY MOUTH DAILY WITH BREAKFAST.   GNP VITAMIN B COMPLEX PO Take 1 tablet by mouth daily.    KRILL OIL PO Take by mouth.   levothyroxine 75 MCG tablet Commonly known as: SYNTHROID TAKE 1 TABLET BY MOUTH EVERY DAY What changed: when to take this   MAGNESIUM-OXIDE PO Take by mouth.   meclizine 25 MG tablet Commonly known as: ANTIVERT Take 1-2 tablets (25-50 mg total) by mouth 3 (three) times daily as needed for dizziness.   PreserVision AREDS 2 Caps Take 1 capsule by mouth in the morning and at bedtime.   SYSTANE HYDRATION PF OP Place 1 drop into both eyes daily as needed (for dry eyes).   traMADol 50 MG tablet Commonly known as: Ultram Take 1 tablet (50 mg total) by mouth every 6 (six) hours as needed for moderate pain or severe pain.   traZODone 50 MG tablet Commonly known as: DESYREL TAKE 1 TABLET BY MOUTH EVERYDAY AT BEDTIME   Vitamin D (Ergocalciferol) 1.25 MG (50000 UNIT) Caps capsule Commonly known as: DRISDOL TAKE 1 CAPSULE (50,000 UNITS TOTAL) BY MOUTH EVERY 7 (SEVEN) DAYS What changed: additional instructions               Discharge Care Instructions  (From admission, onward)           Start     Ordered   03/15/22 0000  No dressing needed        03/15/22 0740              Follow-up Information     Tylene Fantasia, PA-C Follow up in 2 week(s).   Specialty: Physician Assistant Contact information: [address] [place] [address] [phone]                  Time spent on discharge management including discussion of hospital course, clinical condition, outpatient instructions, prescriptions, and follow up with the patient and members of the medical team: >30 minutes  -- Edison Simon , PA-C [facility]  03/15/2022, 8:57 AM [phone] M-F: 7am - 4pm

## 2022-03-15 NOTE — Telephone Encounter (Signed)
Medication Refill - Medication: Glipizide 10 mg  Has the patient contacted their pharmacy? Yes  was on hold 20 mins (Agent: If no, request that the patient contact the pharmacy for the refill. If patient does not wish to contact the pharmacy document the reason why and proceed with request.) (Agent: If yes, when and what did the pharmacy advise?)  Preferred Pharmacy (with phone number or street name): [facility] Has the patient been seen for an appointment in the last year OR does the patient have an upcoming appointment? Yes.    Agent: Please be advised that RX refills may take up to 3 business days. We ask that you follow-up with your pharmacy.

## 2022-03-15 NOTE — Telephone Encounter (Signed)
Requested Prescriptions  Pending Prescriptions Disp Refills   glipiZIDE (GLUCOTROL XL) 10 MG 24 hr tablet 90 tablet 0    Sig: Take 1 tablet (10 mg total) by mouth daily with breakfast.     Endocrinology:  Diabetes - Sulfonylureas Failed - 03/15/2022 11:23 AM      Failed - Cr in normal range and within 360 days    Creatinine, Ser  Date Value Ref Range Status  03/14/2022 2.36 (H) 0.44 - 1.00 mg/dL Final   Creatinine, Urine  Date Value Ref Range Status  03/09/2022 130 mg/dL Final         Passed - HBA1C is between 0 and 7.9 and within 180 days    Hgb A1c MFr Bld  Date Value Ref Range Status  11/01/2021 6.3 (H) 4.8 - 5.6 % Final    Comment:             Prediabetes: 5.7 - 6.4          Diabetes: >6.4          Glycemic control for adults with diabetes: <7.0          Passed - Valid encounter within last 6 months    Recent Outpatient Visits           4 months ago Type 2 diabetes mellitus with stage 4 chronic kidney disease, with long-term current use of insulin [facility])   [facility] Tally Joe T, [name]   7 months ago Type 2 diabetes mellitus with stage 3b chronic kidney disease, without long-term current use of insulin [facility])   [facility] Tally Joe T, [name]   8 months ago Depression, recurrent [facility])   [facility] Tally Joe T, [name]   10 months ago Hypertension associated with diabetes [facility])   [facility] Tally Joe T, [name]   1 year ago Type 2 diabetes mellitus with stage 3b chronic kidney disease, without long-term current use of insulin [facility])   [place], Dionne Bucy, MD       Future Appointments             In 1 month Rollene Rotunda, Jaci Standard, [place], [facility]

## 2022-03-15 NOTE — Discharge Instructions (Signed)
Discharge Instructions 1.  Patient may shower, but do not scrub wounds heavily and dab dry only. 2.  Do not submerge wounds in pool/tub until fully healed. 3.  Do not apply ointments or hydrogen peroxide to the wounds. 4.  May apply ice packs to the wounds for comfort. 5.  Do not drive while taking narcotics for pain control.  Prior to driving, make sure you are able to rotate right and left to look at blindspots without significant pain or discomfort. 6.  No heavy lifting or pushing of more than 10-15 lbs for 4 weeks.

## 2022-03-15 NOTE — Progress Notes (Signed)
Discharge order received. VSS. No signs of acute distress. Discharge instructions given to patient.  No other issues noted.

## 2022-03-16 ENCOUNTER — Telehealth: Payer: Self-pay

## 2022-03-16 LAB — SURGICAL PATHOLOGY

## 2022-03-16 NOTE — Telephone Encounter (Signed)
Transition Care Management Unsuccessful Follow-up Telephone Call  Date of discharge and from where:  [facility] 03/15/22  Attempts:  1st Attempt  Reason for unsuccessful TCM follow-up call:  Left voice message

## 2022-03-20 ENCOUNTER — Telehealth: Payer: Self-pay

## 2022-03-20 NOTE — Telephone Encounter (Signed)
Copied from [place] [phone]. Topic: Complaint - Billing/Coding >> Mar 20, 2022 11:45 AM Cyndi Bender wrote: Date of Incident: Patient unsure of actual date Details of complaint: Patient following up on bill she received for $460.Patient stated she was told that the claim needs to be filed under the pharmacy part of her insurance How would the patient like to see it resolved? Patient asked that the claim be re-submitted correctly On a scale of 1-10, how was your experience?  What would it take to bring it to a 10?   Route to Engineer, building services.

## 2022-03-20 NOTE — Telephone Encounter (Signed)
Copied from [place] [phone]. Topic: Complaint - Billing/Coding >> Mar 20, 2022 11:45 AM Cyndi Bender wrote: Date of Incident: Patient unsure of actual date Details of complaint: Patient following up on bill she received for $460.Patient stated she was told that the claim needs to be filed under the pharmacy part of her insurance How would the patient like to see it resolved? Patient asked that the claim be re-submitted correctly On a scale of 1-10, how was your experience?  What would it take to bring it to a 10?   Route to Engineer, building services.

## 2022-03-28 ENCOUNTER — Encounter: Payer: Medicare Other | Admitting: Physician Assistant

## 2022-03-29 ENCOUNTER — Encounter: Payer: Medicare Other | Admitting: Physician Assistant

## 2022-04-05 NOTE — Telephone Encounter (Signed)
Spoke with pt to let her know we do not file part D but would be happy to complete and appeal form for insurance if she had one.

## 2022-04-10 ENCOUNTER — Encounter: Payer: Medicare Other | Admitting: Physician Assistant

## 2022-04-12 ENCOUNTER — Encounter: Payer: Self-pay | Admitting: Physician Assistant

## 2022-04-12 ENCOUNTER — Ambulatory Visit (INDEPENDENT_AMBULATORY_CARE_PROVIDER_SITE_OTHER): Payer: No Typology Code available for payment source | Admitting: Physician Assistant

## 2022-04-12 VITALS — BP 113/73 | HR 76 | Temp 98.3°F | Ht 63.0 in | Wt 184.0 lb

## 2022-04-12 DIAGNOSIS — K3580 Unspecified acute appendicitis: Secondary | ICD-10-CM

## 2022-04-12 DIAGNOSIS — Z09 Encounter for follow-up examination after completed treatment for conditions other than malignant neoplasm: Secondary | ICD-10-CM

## 2022-04-12 NOTE — Patient Instructions (Signed)

## 2022-04-12 NOTE — Progress Notes (Signed)
[facility] POST-OP OFFICE VISIT  04/12/2022  HPI: Donna Malone is a 77 y.o. female ~1 month s/p laparoscopic appendectomy for acute appendicitis with Dr Hampton Abbot   Overall doing well No abdominal pain, nausea, emesis, fever, chills, or bowel changes She is concerned about her umbilical wound, this is draining clear fluid and seems to be open to her No issues with the other laparoscopic site Ambulating well No other complaints   Vital signs: BP 113/73   Pulse 76   Temp 98.3 F (36.8 C)   Ht '5\' 3"'$  (1.6 m)   Wt 184 lb (83.5 kg)   SpO2 99%   BMI 32.59 kg/m    Physical Exam: Constitutional: Well appearing female, NAD Abdomen: Soft, non-tender, non-distended, no rebound/guarding Skin: Her umbilical incision has dehisced superficially at the skin edge, there is no depth. The wound bed has fibrinous exudate in it which was gently teased away. There is no erythema. No appreciable drainage. No evidence of abscess nor infection.   Assessment/Plan: This is a 77 y.o. female ~1 month s/p laparoscopic appendectomy for acute appendicitis with Dr Hampton Abbot    - Applied silver nitrate to the wound base to aid with scarring, closure, and limit drainage  - Reviewed wound care recommendation; cover with dry gauze and secure. This can be done daily. It is okay to remove the dressing to take a shower, no baths  - Reviewed lifting restrictions; she has completed these  - Reviewed surgical pathology; Acute appendicitis   - I offered follow up in a few weeks to reassess the wound but she preferred to cal as needed or if the wound fails to close. Reviewed signs and symptoms of infection, she is understanding.   -- Edison Simon, PA-C [facility] 04/12/2022, 3:24 PM M-F: 7am - 4pm

## 2022-04-30 ENCOUNTER — Ambulatory Visit (INDEPENDENT_AMBULATORY_CARE_PROVIDER_SITE_OTHER): Payer: No Typology Code available for payment source

## 2022-04-30 VITALS — Ht 63.0 in | Wt 184.0 lb

## 2022-04-30 DIAGNOSIS — Z0000 Encounter for general adult medical examination without abnormal findings: Secondary | ICD-10-CM

## 2022-04-30 NOTE — Patient Instructions (Signed)
Donna Malone , Thank you for taking time to come for your Medicare Wellness Visit. I appreciate your ongoing commitment to your health goals. Please review the following plan we discussed and let me know if I can assist you in the future.   These are the goals we discussed:  Goals      DIET - EAT MORE FRUITS AND VEGETABLES     DIET - INCREASE WATER INTAKE     Exercise 150 minutes per week (moderate activity)     Recommend to start back with water aerobics for 3 days a week for 55 minutes.      Monitor and Manage My Blood Sugar-Diabetes Type 2     Timeframe:  Long-Range Goal Priority:  High Start Date: 06/01/2021                            Expected End Date: 06/02/2022                      Follow Up within 30 days   - check blood sugar at prescribed times - check blood sugar if I feel it is too high or too low - enter blood sugar readings and medication or insulin into daily log - take the blood sugar log to all doctor visits    Why is this important?   Checking your blood sugar at home helps to keep it from getting very high or very low.  Writing the results in a diary or log helps the doctor know how to care for you.  Your blood sugar log should have the time, date and the results.  Also, write down the amount of insulin or other medicine that you take.  Other information, like what you ate, exercise done and how you were feeling, will also be helpful.     Notes:         This is a list of the screening recommended for you and due dates:  Health Maintenance  Topic Date Due   Eye exam for diabetics  11/08/2020   Zoster (Shingles) Vaccine (2 of 2) 06/29/2021   Complete foot exam   09/27/2021   Flu Shot  10/31/2021   COVID-19 Vaccine (4 - 2023-24 season) 12/01/2021   Mammogram  05/04/2022*   Hemoglobin A1C  05/04/2022   Yearly kidney health urinalysis for diabetes  03/10/2023   Yearly kidney function blood test for diabetes  03/15/2023   Medicare Annual Wellness Visit   05/01/2023   DTaP/Tdap/Td vaccine (3 - Td or Tdap) 05/05/2031   Pneumonia Vaccine  Completed   DEXA scan (bone density measurement)  Completed   Hepatitis C Screening: USPSTF Recommendation to screen - Ages 84-79 yo.  Completed   HPV Vaccine  Aged Out   Cologuard (Stool DNA test)  Discontinued  *Topic was postponed. The date shown is not the original due date.    Advanced directives: yes, need copies  Conditions/risks identified: none  Next appointment: Follow up in one year for your annual wellness visit 05/06/23 @ 8:15 am by phone   Preventive Care 65 Years and Older, Female Preventive care refers to lifestyle choices and visits with your health care provider that can promote health and wellness. What does preventive care include? A yearly physical exam. This is also called an annual well check. Dental exams once or twice a year. Routine eye exams. Ask your health care provider how often you should have  your eyes checked. Personal lifestyle choices, including: Daily care of your teeth and gums. Regular physical activity. Eating a healthy diet. Avoiding tobacco and drug use. Limiting alcohol use. Practicing safe sex. Taking low-dose aspirin every day. Taking vitamin and mineral supplements as recommended by your health care provider. What happens during an annual well check? The services and screenings done by your health care provider during your annual well check will depend on your age, overall health, lifestyle risk factors, and family history of disease. Counseling  Your health care provider may ask you questions about your: Alcohol use. Tobacco use. Drug use. Emotional well-being. Home and relationship well-being. Sexual activity. Eating habits. History of falls. Memory and ability to understand (cognition). Work and work Statistician. Reproductive health. Screening  You may have the following tests or measurements: Height, weight, and BMI. Blood pressure. Lipid  and cholesterol levels. These may be checked every 5 years, or more frequently if you are over 42 years old. Skin check. Lung cancer screening. You may have this screening every year starting at age 61 if you have a 30-pack-year history of smoking and currently smoke or have quit within the past 15 years. Fecal occult blood test (FOBT) of the stool. You may have this test every year starting at age 61. Flexible sigmoidoscopy or colonoscopy. You may have a sigmoidoscopy every 5 years or a colonoscopy every 10 years starting at age 36. Hepatitis C blood test. Hepatitis B blood test. Sexually transmitted disease (STD) testing. Diabetes screening. This is done by checking your blood sugar (glucose) after you have not eaten for a while (fasting). You may have this done every 1-3 years. Bone density scan. This is done to screen for osteoporosis. You may have this done starting at age 68. Mammogram. This may be done every 1-2 years. Talk to your health care provider about how often you should have regular mammograms. Talk with your health care provider about your test results, treatment options, and if necessary, the need for more tests. Vaccines  Your health care provider may recommend certain vaccines, such as: Influenza vaccine. This is recommended every year. Tetanus, diphtheria, and acellular pertussis (Tdap, Td) vaccine. You may need a Td booster every 10 years. Zoster vaccine. You may need this after age 41. Pneumococcal 13-valent conjugate (PCV13) vaccine. One dose is recommended after age 64. Pneumococcal polysaccharide (PPSV23) vaccine. One dose is recommended after age 62. Talk to your health care provider about which screenings and vaccines you need and how often you need them. This information is not intended to replace advice given to you by your health care provider. Make sure you discuss any questions you have with your health care provider. Document Released: 04/15/2015 Document  Revised: 12/07/2015 Document Reviewed: 01/18/2015 Elsevier Interactive Patient Education  2017 [place] Prevention in the Home Falls can cause injuries. They can happen to people of all ages. There are many things you can do to make your home safe and to help prevent falls. What can I do on the outside of my home? Regularly fix the edges of walkways and driveways and fix any cracks. Remove anything that might make you trip as you walk through a door, such as a raised step or threshold. Trim any bushes or trees on the path to your home. Use bright outdoor lighting. Clear any walking paths of anything that might make someone trip, such as rocks or tools. Regularly check to see if handrails are loose or broken. Make sure that  both sides of any steps have handrails. Any raised decks and porches should have guardrails on the edges. Have any leaves, snow, or ice cleared regularly. Use sand or salt on walking paths during winter. Clean up any spills in your garage right away. This includes oil or grease spills. What can I do in the bathroom? Use night lights. Install grab bars by the toilet and in the tub and shower. Do not use towel bars as grab bars. Use non-skid mats or decals in the tub or shower. If you need to sit down in the shower, use a plastic, non-slip stool. Keep the floor dry. Clean up any water that spills on the floor as soon as it happens. Remove soap buildup in the tub or shower regularly. Attach bath mats securely with double-sided non-slip rug tape. Do not have throw rugs and other things on the floor that can make you trip. What can I do in the bedroom? Use night lights. Make sure that you have a light by your bed that is easy to reach. Do not use any sheets or blankets that are too big for your bed. They should not hang down onto the floor. Have a firm chair that has side arms. You can use this for support while you get dressed. Do not have throw rugs and other  things on the floor that can make you trip. What can I do in the kitchen? Clean up any spills right away. Avoid walking on wet floors. Keep items that you use a lot in easy-to-reach places. If you need to reach something above you, use a strong step stool that has a grab bar. Keep electrical cords out of the way. Do not use floor polish or wax that makes floors slippery. If you must use wax, use non-skid floor wax. Do not have throw rugs and other things on the floor that can make you trip. What can I do with my stairs? Do not leave any items on the stairs. Make sure that there are handrails on both sides of the stairs and use them. Fix handrails that are broken or loose. Make sure that handrails are as long as the stairways. Check any carpeting to make sure that it is firmly attached to the stairs. Fix any carpet that is loose or worn. Avoid having throw rugs at the top or bottom of the stairs. If you do have throw rugs, attach them to the floor with carpet tape. Make sure that you have a light switch at the top of the stairs and the bottom of the stairs. If you do not have them, ask someone to add them for you. What else can I do to help prevent falls? Wear shoes that: Do not have high heels. Have rubber bottoms. Are comfortable and fit you well. Are closed at the toe. Do not wear sandals. If you use a stepladder: Make sure that it is fully opened. Do not climb a closed stepladder. Make sure that both sides of the stepladder are locked into place. Ask someone to hold it for you, if possible. Clearly mark and make sure that you can see: Any grab bars or handrails. First and last steps. Where the edge of each step is. Use tools that help you move around (mobility aids) if they are needed. These include: Canes. Walkers. Scooters. Crutches. Turn on the lights when you go into a dark area. Replace any light bulbs as soon as they burn out. Set up your furniture so you  have a clear  path. Avoid moving your furniture around. If any of your floors are uneven, fix them. If there are any pets around you, be aware of where they are. Review your medicines with your doctor. Some medicines can make you feel dizzy. This can increase your chance of falling. Ask your doctor what other things that you can do to help prevent falls. This information is not intended to replace advice given to you by your health care provider. Make sure you discuss any questions you have with your health care provider. Document Released: 01/13/2009 Document Revised: 08/25/2015 Document Reviewed: 04/23/2014 Elsevier Interactive Patient Education  2017 [employer].

## 2022-04-30 NOTE — Progress Notes (Signed)
Virtual Visit via Telephone Note  I connected with  Donna Malone on 04/30/22 at  8:15 AM EST by telephone and verified that I am speaking with the correct person using two identifiers.  Location: Patient: home Provider: [facility] Persons participating in the virtual visit: [place]   I discussed the limitations, risks, security and privacy concerns of performing an evaluation and management service by telephone and the availability of in person appointments. The patient expressed understanding and agreed to proceed.  Interactive audio and video telecommunications were attempted between this nurse and patient, however failed, due to patient having technical difficulties OR patient did not have access to video capability.  We continued and completed visit with audio only.  Some vital signs may be absent or patient reported.   Dionisio David, LPN  Subjective:   Donna Malone is a 77 y.o. female who presents for Medicare Annual (Subsequent) preventive examination.  Review of Systems     Cardiac Risk Factors include: advanced age (>17mn, >>3women);diabetes mellitus     Objective:    There were no vitals filed for this visit. There is no height or weight on file to calculate BMI.     04/30/2022    8:24 AM 03/14/2022    8:00 PM 01/15/2022   10:36 AM 10/09/2021    8:12 AM 09/28/2021    8:35 AM 07/28/2021   10:27 AM 07/13/2021    1:52 PM  Advanced Directives  Does Patient Have a Medical Advance Directive? No;Yes Yes Yes Yes Yes Yes No  Type of AParamedicof [place]a Gr[place] will [place] will Healthcare Power of AJohnson Lan[place] HFraser   Does patient want to make changes to medical advance directive?  No - Patient declined       Copy of [place] Chart? No - copy requested  No - copy requested  No - copy requested    Would patient like information on creating  a medical advance directive? No - Patient declined          Current Medications (verified) Outpatient Encounter Medications as of 04/30/2022  Medication Sig   Accu-Chek Softclix Lancets lancets Use as instructed   acetaminophen (TYLENOL) 500 MG tablet Take 500 mg by mouth every 6 (six) hours as needed for mild pain or headache.   allopurinol (ZYLOPRIM) 100 MG tablet TAKE 1 TABLET BY MOUTH EVERY DAY (Patient taking differently: Take 100 mg by mouth daily.)   atorvastatin (LIPITOR) 40 MG tablet TAKE 1 TABLET BY MOUTH EVERY DAY   B Complex Vitamins (GNP VITAMIN B COMPLEX PO) Take 1 tablet by mouth daily.   blood glucose meter kit and supplies Dispense based on patient and insurance preference. Use once daily for FBG. (FOR ICD-10 E10.9, E11.9).   buPROPion (WELLBUTRIN XL) 150 MG 24 hr tablet TAKE 1 TABLET BY MOUTH EVERY DAY (Patient taking differently: Take 150 mg by mouth daily.)   escitalopram (LEXAPRO) 20 MG tablet TAKE 1 TABLET BY MOUTH EVERY DAY (Patient taking differently: Take 20 mg by mouth daily.)   famotidine (PEPCID) 40 MG tablet TAKE 1 TABLET BY MOUTH EVERY DAY (Patient taking differently: Take 40 mg by mouth daily.)   ferrous sulfate 325 (65 FE) MG EC tablet Take 325 mg by mouth daily.   glipiZIDE (GLUCOTROL XL) 10 MG 24 hr tablet Take 1 tablet (10 mg total) by mouth daily with breakfast.   glucose blood (ACCU-CHEK  GUIDE) test strip Use as instructed   KRILL OIL PO Take by mouth.   levothyroxine (SYNTHROID) 75 MCG tablet TAKE 1 TABLET BY MOUTH EVERY DAY (Patient taking differently: Take 75 mcg by mouth daily before breakfast.)   MAGNESIUM-OXIDE PO Take by mouth.   meclizine (ANTIVERT) 25 MG tablet Take 1-2 tablets (25-50 mg total) by mouth 3 (three) times daily as needed for dizziness.   Multiple Vitamins-Minerals (PRESERVISION AREDS 2) CAPS Take 1 capsule by mouth in the morning and at bedtime.   Polyethyl Glycol-Propyl Glycol (SYSTANE HYDRATION PF OP) Place 1 drop into both eyes  daily as needed (for dry eyes).   traZODone (DESYREL) 50 MG tablet TAKE 1 TABLET BY MOUTH EVERYDAY AT BEDTIME   Vitamin D, Ergocalciferol, (DRISDOL) 1.25 MG (50000 UNIT) CAPS capsule TAKE 1 CAPSULE (50,000 UNITS TOTAL) BY MOUTH EVERY 7 (SEVEN) DAYS (Patient taking differently: Take 50,000 Units by mouth every 7 (seven) days. Sunday)   traMADol (ULTRAM) 50 MG tablet Take 1 tablet (50 mg total) by mouth every 6 (six) hours as needed for moderate pain or severe pain. (Patient not taking: Reported on 04/30/2022)   [DISCONTINUED] acetaminophen (TYLENOL) 500 MG tablet Take 2 tablets (1,000 mg total) by mouth every 6 (six) hours as needed for mild pain.   No facility-administered encounter medications on file as of 04/30/2022.    Allergies (verified) Sulfa antibiotics   History: Past Medical History:  Diagnosis Date   Bone spur    heel   Depression    Diabetes mellitus without complication ([place])    Gout    Hypertension    Past Surgical History:  Procedure Laterality Date   ABDOMINAL HYSTERECTOMY  04/1970   partial   CHOLECYSTECTOMY  late 1990's   COLONOSCOPY WITH PROPOFOL N/A 10/09/2021   Procedure: COLONOSCOPY WITH PROPOFOL;  Surgeon: Jonathon Bellows, MD;  Location: [facility];  Service: Gastroenterology;  Laterality: N/A;   LAPAROSCOPIC APPENDECTOMY N/A 03/14/2022   Procedure: APPENDECTOMY LAPAROSCOPIC;  Surgeon: Olean Ree, MD;  Location: [facility] OR[facility];  Service: General;  Laterality: N/A;   Family History  Problem Relation Age of Onset   CAD Mother    Heart attack Mother    Heart disease Father    Diabetes Cousin    Cancer Cousin    Social History   Socioeconomic History   Marital status: Widowed    Spouse name: [name]   Number of children: 3   Years of education: Not on file   Highest education level: 12th grade  Occupational History   Occupation: retired  Tobacco Use   Smoking status: Never   Smokeless tobacco: Never  Vaping Use   Vaping Use: Never used  Substance and  Sexual Activity   Alcohol use: No   Drug use: No   Sexual activity: Not on file  Other Topics Concern   Not on file  Social History Narrative   Not on file   Social Determinants of Health   Financial Resource Strain: Low Risk  (04/30/2022)   Overall Financial Resource Strain (CARDIA)    Difficulty of Paying Living Expenses: Not hard at all  Food Insecurity: No Food Insecurity (04/30/2022)   Hunger Vital Sign    Worried About Running Out of Food in the Last Year: Never true    [place] in the Last Year: Never true  Transportation Needs: No Transportation Needs (04/30/2022)   PRAPARE - Hydrologist (Medical): No    Lack of Transportation (  Non-Medical): No  Physical Activity: Inactive (04/30/2022)   Exercise Vital Sign    Days of Exercise per Week: 0 days    Minutes of Exercise per Session: 0 min  Stress: No Stress Concern Present (04/30/2022)   [place]    Feeling of Stress : Not at all  Social Connections: Moderately Isolated (04/30/2022)   Social Connection and Isolation Panel [NHANES]    Frequency of Communication with Friends and Family: More than three times a week    Frequency of Social Gatherings with Friends and Family: Twice a week    Attends Religious Services: More than 4 times per year    Active Member of [employer] or Organizations: No    Attends Archivist Meetings: Never    Marital Status: Widowed    Tobacco Counseling Counseling given: Not Answered   Clinical Intake:  Pre-visit preparation completed: Yes  Pain : No/denies pain     Nutritional Risks: None Diabetes: Yes CBG done?: No Did pt. bring in CBG monitor from home?: No  How often do you need to have someone help you when you read instructions, pamphlets, or other written materials from your doctor or pharmacy?: 1 - Never  Diabetic?yes Nutrition Risk Assessment:  Has the patient had any  N/V/D within the last 2 months?  Yes  Does the patient have any non-healing wounds?  No  Has the patient had any unintentional weight loss or weight gain?  No   Diabetes:  Is the patient diabetic?  Yes  If diabetic, was a CBG obtained today?  No  Did the patient bring in their glucometer from home?  No  How often do you monitor your CBG's? Every day.   Financial Strains and Diabetes Management:  Are you having any financial strains with the device, your supplies or your medication? No .  Does the patient want to be seen by Chronic Care Management for management of their diabetes?  No  Would the patient like to be referred to a Nutritionist or for Diabetic Management?  No   Diabetic Exams:  Diabetic Eye Exam: Completed 11/09/19. Overdue for diabetic eye exam. Pt has been advised about the importance in completing this exam.  Diabetic Foot Exam: Completed 09/27/20. Pt has been advised about the importance in completing this exam.    Interpreter Needed?: No  Information entered by :: Kirke Shaggy, LPN   Activities of Daily Living    04/30/2022    8:25 AM 03/14/2022    8:00 PM  In your present state of health, do you have any difficulty performing the following activities:  Hearing? 0 0  Vision? 0 0  Difficulty concentrating or making decisions? 0 0  Walking or climbing stairs? 0 0  Dressing or bathing? 0 0  Doing errands, shopping? 0 1  Comment  sometimes  Preparing Food and eating ? N   Using the Toilet? N   In the past six months, have you accidently leaked urine? N   Do you have problems with loss of bowel control? N   Managing your Medications? N   Managing your Finances? N   Housekeeping or managing your Housekeeping? N     Patient Care Team: [name], [name] as PCP - General (Family Medicine) Lorelee Cover., MD as Consulting Physician (Ophthalmology) Lovell Sheehan, MD as Consulting Physician (Orthopedic Surgery) Murlean Iba, MD (Nephrology) Earlie Server,  MD as Consulting Physician (Oncology) Germaine Pomfret, [facility] (  Pharmacist)  Indicate any recent Medical Services you may have received from other than [facility] providers in the past year (date may be approximate).     Assessment:   This is a routine wellness examination for Donna Malone.  Hearing/Vision screen Hearing Screening - Comments:: No aids Vision Screening - Comments:: Wears glasses- Dr.Bell  Dietary issues and exercise activities discussed: Current Exercise Habits: The patient does not participate in regular exercise at present   Goals Addressed             This Visit's Progress    DIET - INCREASE WATER INTAKE         Depression Screen    04/30/2022    8:21 AM 11/01/2021    3:50 PM 06/21/2021    1:22 PM 05/04/2021    1:20 PM 04/27/2021    8:24 AM 09/27/2020   11:26 AM 04/26/2020    8:29 AM  PHQ 2/9 Scores  PHQ - 2 Score 1 0 '3 6 4 6 4  '$ PHQ- 9 Score '3 4 8 19 9 16 11    '$ Fall Risk    04/30/2022    8:25 AM 11/01/2021    3:50 PM 06/21/2021    1:21 PM 04/27/2021    8:28 AM 04/26/2020    8:28 AM  Fall Risk   Falls in the past year? 0 0 0 0 0  Number falls in past yr: 0 0  0 0  Injury with Fall? 0 0  0 0  Risk for fall due to : No Fall Risks No Fall Risks  No Fall Risks   Follow up Falls prevention discussed;Falls evaluation completed Falls evaluation completed  Falls evaluation completed     FALL RISK PREVENTION PERTAINING TO THE HOME:  Any stairs in or around the home? Yes  If so, are there any without handrails? No  Home free of loose throw rugs in walkways, pet beds, electrical cords, etc? Yes  Adequate lighting in your home to reduce risk of falls? Yes   ASSISTIVE DEVICES UTILIZED TO PREVENT FALLS:  Life alert? No  Use of a cane, walker or w/c? Yes  Grab bars in the bathroom? No  Shower chair or bench in shower? Yes  Elevated toilet seat or a handicapped toilet? Yes    Cognitive Function:        04/30/2022    8:29 AM 04/14/2018   11:08 AM 03/20/2016     9:04 AM  6CIT Screen  What Year? 0 points 0 points 0 points  What month? 0 points 0 points 0 points  What time? 0 points 0 points 0 points  Count back from 20 0 points 0 points 0 points  Months in reverse 0 points 0 points 0 points  Repeat phrase 2 points 0 points 2 points  Total Score 2 points 0 points 2 points    Immunizations Immunization History  Administered Date(s) Administered   Fluad Quad(high Dose 65+) 04/20/2019, 05/04/2021   Influenza Split 12/16/2009, 03/21/2011, 01/30/2012   Influenza, High Dose Seasonal PF 02/09/2015, 03/20/2016, 12/24/2016, 02/07/2018   Influenza,inj,Quad PF,6+ Mos 02/06/2013, 12/03/2013   PFIZER(Purple Top)SARS-COV-2 Vaccination 06/19/2019, 07/14/2019, 03/18/2020   Pneumococcal Conjugate-13 12/03/2013   Pneumococcal Polysaccharide-23 03/21/2011   Tdap 04/09/2008, 05/04/2021   Zoster Recombinat (Shingrix) 05/04/2021    TDAP status: Up to date  Flu Vaccine status: Up to date  Pneumococcal vaccine status: Up to date  Covid-19 vaccine status: Completed vaccines  Qualifies for Shingles Vaccine? Yes   Zostavax completed No  Shingrix Completed?: No.    Education has been provided regarding the importance of this vaccine. Patient has been advised to call insurance company to determine out of pocket expense if they have not yet received this vaccine. Advised may also receive vaccine at local pharmacy or Health Dept. Verbalized acceptance and understanding.  Screening Tests Health Maintenance  Topic Date Due   OPHTHALMOLOGY EXAM  11/08/2020   Zoster Vaccines- Shingrix (2 of 2) 06/29/2021   FOOT EXAM  09/27/2021   INFLUENZA VACCINE  10/31/2021   COVID-19 Vaccine (4 - 2023-24 season) 12/01/2021   MAMMOGRAM  05/04/2022 (Originally 04/23/2019)   HEMOGLOBIN A1C  05/04/2022   Diabetic kidney evaluation - Urine ACR  03/10/2023   Diabetic kidney evaluation - eGFR measurement  03/15/2023   Medicare Annual Wellness (AWV)  05/01/2023   DTaP/Tdap/Td (3 -  Td or Tdap) 05/05/2031   Pneumonia Vaccine 6+ Years old  Completed   DEXA SCAN  Completed   Hepatitis C Screening  Completed   HPV VACCINES  Aged Out   Fecal DNA (Cologuard)  Discontinued    Health Maintenance  Health Maintenance Due  Topic Date Due   OPHTHALMOLOGY EXAM  11/08/2020   Zoster Vaccines- Shingrix (2 of 2) 06/29/2021   FOOT EXAM  09/27/2021   INFLUENZA VACCINE  10/31/2021   COVID-19 Vaccine (4 - 2023-24 season) 12/01/2021    Colorectal cancer screening: No longer required. - is having another colonoscopy this year- last one 10/09/21  Mammogram status: No longer required due to age.  Bone Density status: Completed 03/10/18. Results reflect: Bone density results: NORMAL. Repeat every 5 years.- declined referral= has multiple myeloma and has had tests that all was ok  Lung Cancer Screening: (Low Dose CT Chest recommended if Age 42-80 years, 30 pack-year currently smoking OR have quit w/in 15years.) does not qualify.   Additional Screening:  Hepatitis C Screening: does qualify; Completed 11/04/19  Vision Screening: Recommended annual ophthalmology exams for early detection of glaucoma and other disorders of the eye. Is the patient up to date with their annual eye exam?  Yes  Who is the provider or what is the name of the office in which the patient attends annual eye exams? Dr.Bell If pt is not established with a provider, would they like to be referred to a provider to establish care? No .   Dental Screening: Recommended annual dental exams for proper oral hygiene  Community Resource Referral / Chronic Care Management: CRR required this visit?  No   CCM required this visit?  No      Plan:     I have personally reviewed and noted the following in the patient's chart:   Medical and social history Use of alcohol, tobacco or illicit drugs  Current medications and supplements including opioid prescriptions. Patient is not currently taking opioid  prescriptions. Functional ability and status Nutritional status Physical activity Advanced directives List of other physicians Hospitalizations, surgeries, and ER visits in previous 12 months Vitals Screenings to include cognitive, depression, and falls Referrals and appointments  In addition, I have reviewed and discussed with patient certain preventive protocols, quality metrics, and best practice recommendations. A written personalized care plan for preventive services as well as general preventive health recommendations were provided to patient.     Dionisio David, LPN   11/24/2351   Nurse Notes: none

## 2022-05-01 ENCOUNTER — Ambulatory Visit (INDEPENDENT_AMBULATORY_CARE_PROVIDER_SITE_OTHER): Payer: No Typology Code available for payment source | Admitting: Family Medicine

## 2022-05-01 ENCOUNTER — Encounter: Payer: Self-pay | Admitting: Family Medicine

## 2022-05-01 VITALS — BP 126/67 | HR 70 | Temp 98.2°F | Wt 190.3 lb

## 2022-05-01 DIAGNOSIS — N184 Chronic kidney disease, stage 4 (severe): Secondary | ICD-10-CM

## 2022-05-01 DIAGNOSIS — E1122 Type 2 diabetes mellitus with diabetic chronic kidney disease: Secondary | ICD-10-CM

## 2022-05-01 DIAGNOSIS — Z0000 Encounter for general adult medical examination without abnormal findings: Secondary | ICD-10-CM

## 2022-05-01 DIAGNOSIS — Z794 Long term (current) use of insulin: Secondary | ICD-10-CM | POA: Diagnosis not present

## 2022-05-01 NOTE — Assessment & Plan Note (Signed)
UTD on dental and vision Healing from appendix removal last month Things to do to keep yourself healthy  - Exercise at least 30-45 minutes a day, 3-4 days a week.  - Eat a low-fat diet with lots of fruits and vegetables, up to 7-9 servings per day.  - Seatbelts can save your life. Wear them always.  - Smoke detectors on every level of your home, check batteries every year.  - Eye Doctor - have an eye exam every 1-2 years  - Safe sex - if you may be exposed to STDs, use a condom.  - Alcohol -  If you drink, do it moderately, less than 2 drinks per day.  - [place]. Choose someone to speak for you if you are not able.  - Depression is common in our stressful world.If you're feeling down or losing interest in things you normally enjoy, please come in for a visit.  - Violence - If anyone is threatening or hurting you, please call immediately.

## 2022-05-01 NOTE — Progress Notes (Signed)
I,[name],acting as a Education administrator for [name], [name].,have documented all relevant documentation on the behalf of [name], [name],as directed by  [name], [name] while in the presence of [name], [name].   Complete physical exam   Patient: Donna Malone   DOB: [birth year]   77 y.o. Female  MRN: [id number] Visit Date: 05/01/2022  Today's healthcare provider: [name], [name]  Introduced to nurse practitioner role and practice setting.  All questions answered.  Discussed provider/patient relationship and expectations.  Chief Complaint  Patient presents with   Annual Exam   Subjective    Donna Malone is a 77 y.o. female who presents today for a complete physical exam.  She reports consuming a general diet. The patient does not participate in regular exercise at present. She generally feels fairly well. She reports sleeping poorly. She does not have additional problems to discuss today.   HPI  Patient declined Covid-19 vaccine. Patient declined 2nd shingles vaccine - will get it at the pharmacy due to insurance coverage. Patient declined influenza vaccine- will get it at the pharmacy  Ophthalmology exam scheduled for 05/2022  Past Medical History:  Diagnosis Date   Bone spur    heel   Depression    Diabetes mellitus without complication ([place])    Gout    Hypertension    Past Surgical History:  Procedure Laterality Date   ABDOMINAL HYSTERECTOMY  04/1970   partial   CHOLECYSTECTOMY  late 1990's   COLONOSCOPY WITH PROPOFOL N/A 10/09/2021   Procedure: COLONOSCOPY WITH PROPOFOL;  Surgeon: Jonathon Bellows, MD;  Location: [facility] ENDOSCOPY;  Service: Gastroenterology;  Laterality: N/A;   LAPAROSCOPIC APPENDECTOMY N/A 03/14/2022   Procedure: APPENDECTOMY LAPAROSCOPIC;  Surgeon: Olean Ree, MD;  Location: [facility] ORS;  Service: General;  Laterality: N/A;   Social History   Socioeconomic History   Marital status: Widowed    Spouse name: [name]   Number of  children: 3   Years of education: Not on file   Highest education level: 12th grade  Occupational History   Occupation: retired  Tobacco Use   Smoking status: Never   Smokeless tobacco: Never  Vaping Use   Vaping Use: Never used  Substance and Sexual Activity   Alcohol use: No   Drug use: No   Sexual activity: Not Currently  Other Topics Concern   Not on file  Social History Narrative   Not on file   Social Determinants of Health   Financial Resource Strain: Low Risk  (04/30/2022)   Overall Financial Resource Strain (CARDIA)    Difficulty of Paying Living Expenses: Not hard at all  Food Insecurity: No Food Insecurity (04/30/2022)   Hunger Vital Sign    Worried About Running Out of Food in the Last Year: Never true    [place] in the Last Year: Never true  Transportation Needs: No Transportation Needs (04/30/2022)   PRAPARE - Hydrologist (Medical): No    Lack of Transportation (Non-Medical): No  Physical Activity: Inactive (04/30/2022)   Exercise Vital Sign    Days of Exercise per Week: 0 days    Minutes of Exercise per Session: 0 min  Stress: No Stress Concern Present (04/30/2022)   [place]    Feeling of Stress : Not at all  Social Connections: Moderately Isolated (04/30/2022)   Social Connection and Isolation Panel [NHANES]  Frequency of Communication with Friends and Family: More than three times a week    Frequency of Social Gatherings with Friends and Family: Twice a week    Attends Religious Services: More than 4 times per year    Active Member of [employer] or Organizations: No    Attends Archivist Meetings: Never    Marital Status: Widowed  Intimate Partner Violence: Not At Risk (04/30/2022)   Humiliation, Afraid, Rape, and Kick questionnaire    Fear of Current or Ex-Partner: No    Emotionally Abused: No    Physically Abused: No    Sexually Abused: No    Family Status  Relation Name Status   Mother  Deceased at age 37   Father  Deceased at age 55   Brother  Deceased at age 23       due to gunshot wound   Cousin  (Not Specified)   Family History  Problem Relation Age of Onset   CAD Mother    Heart attack Mother    Heart disease Father    Diabetes Cousin    Cancer Cousin    Allergies  Allergen Reactions   Sulfa Antibiotics Other (See Comments)    Patient Care Team: [name], [name] as PCP - General (Family Medicine) Lorelee Cover., MD as Consulting Physician (Ophthalmology) Lovell Sheehan, MD as Consulting Physician (Orthopedic Surgery) Murlean Iba, MD (Nephrology) Earlie Server, MD as Consulting Physician (Oncology) Germaine Pomfret, [facility] (Pharmacist)   Medications: Outpatient Medications Prior to Visit  Medication Sig   Accu-Chek Softclix Lancets lancets Use as instructed   acetaminophen (TYLENOL) 500 MG tablet Take 500 mg by mouth every 6 (six) hours as needed for mild pain or headache.   allopurinol (ZYLOPRIM) 100 MG tablet TAKE 1 TABLET BY MOUTH EVERY DAY (Patient taking differently: Take 100 mg by mouth daily.)   atorvastatin (LIPITOR) 40 MG tablet TAKE 1 TABLET BY MOUTH EVERY DAY   B Complex Vitamins (GNP VITAMIN B COMPLEX PO) Take 1 tablet by mouth daily.   blood glucose meter kit and supplies Dispense based on patient and insurance preference. Use once daily for FBG. (FOR ICD-10 E10.9, E11.9).   buPROPion (WELLBUTRIN XL) 150 MG 24 hr tablet TAKE 1 TABLET BY MOUTH EVERY DAY (Patient taking differently: Take 150 mg by mouth daily.)   escitalopram (LEXAPRO) 20 MG tablet TAKE 1 TABLET BY MOUTH EVERY DAY (Patient taking differently: Take 20 mg by mouth daily.)   famotidine (PEPCID) 40 MG tablet TAKE 1 TABLET BY MOUTH EVERY DAY (Patient taking differently: Take 40 mg by mouth daily.)   ferrous sulfate 325 (65 FE) MG EC tablet Take 325 mg by mouth daily.   glipiZIDE (GLUCOTROL XL) 10 MG 24 hr tablet Take 1 tablet  (10 mg total) by mouth daily with breakfast.   glucose blood (ACCU-CHEK GUIDE) test strip Use as instructed   KRILL OIL PO Take by mouth.   levothyroxine (SYNTHROID) 75 MCG tablet TAKE 1 TABLET BY MOUTH EVERY DAY (Patient taking differently: Take 75 mcg by mouth daily before breakfast.)   MAGNESIUM-OXIDE PO Take by mouth.   meclizine (ANTIVERT) 25 MG tablet Take 1-2 tablets (25-50 mg total) by mouth 3 (three) times daily as needed for dizziness.   Multiple Vitamins-Minerals (PRESERVISION AREDS 2) CAPS Take 1 capsule by mouth in the morning and at bedtime.   Polyethyl Glycol-Propyl Glycol (SYSTANE HYDRATION PF OP) Place 1 drop into both eyes daily as needed (for dry eyes).  traZODone (DESYREL) 50 MG tablet TAKE 1 TABLET BY MOUTH EVERYDAY AT BEDTIME   Vitamin D, Ergocalciferol, (DRISDOL) 1.25 MG (50000 UNIT) CAPS capsule TAKE 1 CAPSULE (50,000 UNITS TOTAL) BY MOUTH EVERY 7 (SEVEN) DAYS (Patient taking differently: Take 50,000 Units by mouth every 7 (seven) days. Sunday)   [DISCONTINUED] traMADol (ULTRAM) 50 MG tablet Take 1 tablet (50 mg total) by mouth every 6 (six) hours as needed for moderate pain or severe pain. (Patient not taking: Reported on 04/30/2022)   No facility-administered medications prior to visit.    Review of Systems  Constitutional:  Positive for chills and fatigue.  HENT:  Positive for hearing loss.   Eyes:  Positive for discharge, itching and visual disturbance.  Gastrointestinal:  Positive for diarrhea.  Musculoskeletal:  Positive for arthralgias, back pain, myalgias and neck pain.  Neurological:  Positive for dizziness and weakness.   Last CBC Lab Results  Component Value Date   WBC 13.8 (H) 03/13/2022   HGB 11.5 (L) 03/13/2022   HCT 37.0 03/13/2022   MCV 107.6 (H) 03/13/2022   MCH 33.4 03/13/2022   RDW 14.7 03/13/2022   PLT 234 40/01/2724   Last metabolic panel Lab Results  Component Value Date   GLUCOSE 157 (H) 03/14/2022   NA 138 03/14/2022   K 4.4  03/14/2022   CL 112 (H) 03/14/2022   CO2 19 (L) 03/14/2022   BUN 36 (H) 03/14/2022   CREATININE 2.36 (H) 03/14/2022   GFRNONAA 21 (L) 03/14/2022   CALCIUM 9.7 03/14/2022   PHOS 3.5 03/09/2022   PROT 7.1 03/14/2022   ALBUMIN 3.9 03/14/2022   LABGLOB 2.9 01/04/2022   AGRATIO 1.2 05/04/2021   BILITOT 0.8 03/14/2022   ALKPHOS 100 03/14/2022   AST 23 03/14/2022   ALT 19 03/14/2022   ANIONGAP 7 03/14/2022   Last lipids Lab Results  Component Value Date   CHOL 204 (H) 05/04/2021   HDL 33 (L) 05/04/2021   LDLCALC 91 05/04/2021   TRIG 487 (H) 05/04/2021   CHOLHDL 6.2 (H) 05/04/2021   Last hemoglobin A1c Lab Results  Component Value Date   HGBA1C 6.3 (H) 11/01/2021   Last thyroid functions Lab Results  Component Value Date   TSH 1.250 05/04/2021      Objective    BP 126/67 (BP Location: Right Arm, Patient Position: Sitting, Cuff Size: Normal)   Pulse 70   Temp 98.2 F (36.8 C) (Oral)   Wt 190 lb 4.8 oz (86.3 kg)   SpO2 99%   BMI 33.71 kg/m   BP Readings from Last 3 Encounters:  05/01/22 126/67  04/12/22 113/73  03/15/22 105/67   Wt Readings from Last 3 Encounters:  05/01/22 190 lb 4.8 oz (86.3 kg)  04/30/22 184 lb (83.5 kg)  04/12/22 184 lb (83.5 kg)   SpO2 Readings from Last 3 Encounters:  05/01/22 99%  04/12/22 99%  03/15/22 97%       Physical Exam Vitals and nursing note reviewed.  Constitutional:      General: She is awake. She is not in acute distress.    Appearance: Normal appearance. She is well-developed and well-groomed. She is obese. She is not ill-appearing, toxic-appearing or diaphoretic.  HENT:     Head: Normocephalic and atraumatic.     Jaw: There is normal jaw occlusion. No trismus, tenderness, swelling or pain on movement.     Right Ear: Hearing, tympanic membrane, ear canal and external ear normal. There is no impacted cerumen.     Left  Ear: Hearing, tympanic membrane, ear canal and external ear normal. There is no impacted cerumen.      Nose: Nose normal. No congestion or rhinorrhea.     Right Turbinates: Not enlarged, swollen or pale.     Left Turbinates: Not enlarged, swollen or pale.     Right Sinus: No maxillary sinus tenderness or frontal sinus tenderness.     Left Sinus: No maxillary sinus tenderness or frontal sinus tenderness.     Mouth/Throat:     Lips: Pink.     Mouth: Mucous membranes are moist. No injury.     Tongue: No lesions.     Pharynx: Oropharynx is clear. Uvula midline. No pharyngeal swelling, oropharyngeal exudate, posterior oropharyngeal erythema or uvula swelling.     Tonsils: No tonsillar exudate or tonsillar abscesses.  Eyes:     General: Lids are normal. Lids are everted, no foreign bodies appreciated. Vision grossly intact. Gaze aligned appropriately. No allergic shiner or visual field deficit.       Right eye: No discharge.        Left eye: No discharge.     Extraocular Movements: Extraocular movements intact.     Conjunctiva/sclera: Conjunctivae normal.     Right eye: Right conjunctiva is not injected. No exudate.    Left eye: Left conjunctiva is not injected. No exudate.    Pupils: Pupils are equal, round, and reactive to light.  Neck:     Thyroid: No thyroid mass, thyromegaly or thyroid tenderness.     Vascular: No carotid bruit.     Trachea: Trachea normal.  Cardiovascular:     Rate and Rhythm: Normal rate and regular rhythm.     Pulses: Normal pulses.          Carotid pulses are 2+ on the right side and 2+ on the left side.      Radial pulses are 2+ on the right side and 2+ on the left side.       Dorsalis pedis pulses are 2+ on the right side and 2+ on the left side.       Posterior tibial pulses are 2+ on the right side and 2+ on the left side.     Heart sounds: Normal heart sounds, S1 normal and S2 normal. No murmur heard.    No friction rub. No gallop.  Pulmonary:     Effort: Pulmonary effort is normal. No respiratory distress.     Breath sounds: Normal breath sounds and  air entry. No stridor. No wheezing, rhonchi or rales.  Chest:     Chest wall: No tenderness.  Abdominal:     General: Abdomen is flat. Bowel sounds are normal. There is no distension.     Palpations: Abdomen is soft. There is no mass.     Tenderness: There is no abdominal tenderness. There is no right CVA tenderness, left CVA tenderness, guarding or rebound.     Hernia: No hernia is present.  Genitourinary:    Comments: Exam deferred; denies complaints Musculoskeletal:        General: No swelling, tenderness, deformity or signs of injury. Normal range of motion.     Cervical back: Full passive range of motion without pain, normal range of motion and neck supple. No edema, rigidity or tenderness. No muscular tenderness.     Right lower leg: No edema.     Left lower leg: No edema.  Lymphadenopathy:     Cervical: No cervical adenopathy.     Right cervical: No  superficial, deep or posterior cervical adenopathy.    Left cervical: No superficial, deep or posterior cervical adenopathy.  Skin:    General: Skin is warm and dry.     Capillary Refill: Capillary refill takes less than 2 seconds.     Coloration: Skin is not jaundiced or pale.     Findings: No bruising, erythema, lesion or rash.  Neurological:     General: No focal deficit present.     Mental Status: She is alert and oriented to person, place, and time. Mental status is at baseline.     GCS: GCS eye subscore is 4. GCS verbal subscore is 5. GCS motor subscore is 6.     Sensory: Sensation is intact. No sensory deficit.     Motor: Motor function is intact. No weakness.     Coordination: Coordination is intact. Coordination normal.     Gait: Gait is intact. Gait normal.  Psychiatric:        Attention and Perception: Attention and perception normal.        Mood and Affect: Mood and affect normal.        Speech: Speech normal.        Behavior: Behavior normal. Behavior is cooperative.        Thought Content: Thought content normal.         Cognition and Memory: Cognition and memory normal.        Judgment: Judgment normal.     Last depression screening scores    05/01/2022    1:32 PM 04/30/2022    8:21 AM 11/01/2021    3:50 PM  PHQ 2/9 Scores  PHQ - 2 Score 3 1 0  PHQ- 9 Score '12 3 4   '$ Last fall risk screening    05/01/2022    1:32 PM  Fall Risk   Falls in the past year? 0  Number falls in past yr: 0  Injury with Fall? 0   Last Audit-C alcohol use screening    04/30/2022    8:20 AM  Alcohol Use Disorder Test (AUDIT)  1. How often do you have a drink containing alcohol? 0  2. How many drinks containing alcohol do you have on a typical day when you are drinking? 0  3. How often do you have six or more drinks on one occasion? 0  AUDIT-C Score 0   A score of 3 or more in women, and 4 or more in men indicates increased risk for alcohol abuse, EXCEPT if all of the points are from question 1   No results found for any visits on 05/01/22.  Assessment & Plan    Routine Health Maintenance and Physical Exam  Exercise Activities and Dietary recommendations  Goals      DIET - EAT MORE FRUITS AND VEGETABLES     DIET - INCREASE WATER INTAKE     Exercise 150 minutes per week (moderate activity)     Recommend to start back with water aerobics for 3 days a week for 55 minutes.      Monitor and Manage My Blood Sugar-Diabetes Type 2     Timeframe:  Long-Range Goal Priority:  High Start Date: 06/01/2021                            Expected End Date: 06/02/2022                      Follow  Up within 30 days   - check blood sugar at prescribed times - check blood sugar if I feel it is too high or too low - enter blood sugar readings and medication or insulin into daily log - take the blood sugar log to all doctor visits    Why is this important?   Checking your blood sugar at home helps to keep it from getting very high or very low.  Writing the results in a diary or log helps the doctor know how to care for you.   Your blood sugar log should have the time, date and the results.  Also, write down the amount of insulin or other medicine that you take.  Other information, like what you ate, exercise done and how you were feeling, will also be helpful.     Notes:         Immunization History  Administered Date(s) Administered   Fluad Quad(high Dose 65+) 04/20/2019, 05/04/2021   Influenza Split 12/16/2009, 03/21/2011, 01/30/2012   Influenza, High Dose Seasonal PF 02/09/2015, 03/20/2016, 12/24/2016, 02/07/2018   Influenza,inj,Quad PF,6+ Mos 02/06/2013, 12/03/2013   PFIZER(Purple Top)SARS-COV-2 Vaccination 06/19/2019, 07/14/2019, 03/18/2020   Pneumococcal Conjugate-13 12/03/2013   Pneumococcal Polysaccharide-23 03/21/2011   Tdap 04/09/2008, 05/04/2021   Zoster Recombinat (Shingrix) 05/04/2021    Health Maintenance  Topic Date Due   OPHTHALMOLOGY EXAM  11/08/2020   Zoster Vaccines- Shingrix (2 of 2) 06/29/2021   FOOT EXAM  09/27/2021   COVID-19 Vaccine (4 - 2023-24 season) 12/01/2021   MAMMOGRAM  05/04/2022 (Originally 04/23/2019)   INFLUENZA VACCINE  07/01/2022 (Originally 10/31/2021)   HEMOGLOBIN A1C  05/04/2022   Diabetic kidney evaluation - Urine ACR  03/10/2023   Diabetic kidney evaluation - eGFR measurement  03/15/2023   Medicare Annual Wellness (AWV)  05/01/2023   DTaP/Tdap/Td (3 - Td or Tdap) 05/05/2031   Pneumonia Vaccine 56+ Years old  Completed   DEXA SCAN  Completed   Hepatitis C Screening  Completed   HPV VACCINES  Aged Out   Fecal DNA (Cologuard)  Discontinued    Discussed health benefits of physical activity, and encouraged her to engage in regular exercise appropriate for her age and condition.  Problem List Items Addressed This Visit       Endocrine   RESOLVED: Type 2 diabetes mellitus with stage 3b chronic kidney disease, without long-term current use of insulin (HCC)   Type 2 diabetes mellitus with stage 4 chronic kidney disease, with long-term current use of insulin  (HCC)    Chronic, previously stable Repeat A1c Continue to recommend balanced, lower carb meals. Smaller meal size, adding snacks. Choosing water as drink of choice and increasing purposeful exercise. Currently controlled with lifestyle and glipzide 10        Other   Annual physical exam - Primary    UTD on dental and vision Healing from appendix removal last month Things to do to keep yourself healthy  - Exercise at least 30-45 minutes a day, 3-4 days a week.  - Eat a low-fat diet with lots of fruits and vegetables, up to 7-9 servings per day.  - Seatbelts can save your life. Wear them always.  - Smoke detectors on every level of your home, check batteries every year.  - Eye Doctor - have an eye exam every 1-2 years  - Safe sex - if you may be exposed to STDs, use a condom.  - Alcohol -  If you drink, do it moderately, less than 2 drinks  per day.  - [place]. Choose someone to speak for you if you are not able.  - Depression is common in our stressful world.If you're feeling down or losing interest in things you normally enjoy, please come in for a visit.  - Violence - If anyone is threatening or hurting you, please call immediately.       Relevant Orders   CBC   Comprehensive Metabolic Panel (CMET)   Lipid panel   TSH   Return in about 6 months (around 10/30/2022) for nurse follow up, chonic disease management.    Vonna Kotyk, [name], have reviewed all documentation for this visit. The documentation on 05/01/22 for the exam, diagnosis, procedures, and orders are all accurate and complete.  [name], [place] [phone] (phone) [phone] (fax)  [place]

## 2022-05-01 NOTE — Assessment & Plan Note (Signed)
Chronic, previously stable Repeat A1c Continue to recommend balanced, lower carb meals. Smaller meal size, adding snacks. Choosing water as drink of choice and increasing purposeful exercise. Currently controlled with lifestyle and glipzide 10

## 2022-05-02 LAB — LIPID PANEL
Chol/HDL Ratio: 5.6 ratio — ABNORMAL HIGH (ref 0.0–4.4)
Cholesterol, Total: 208 mg/dL — ABNORMAL HIGH (ref 100–199)
HDL: 37 mg/dL — ABNORMAL LOW (ref >39)
LDL Chol Calc (NIH): 111 mg/dL — ABNORMAL HIGH (ref 0–99)
Triglycerides: 346 mg/dL — ABNORMAL HIGH (ref 0–149)
VLDL Cholesterol Cal: 60 mg/dL — ABNORMAL HIGH (ref 5–40)

## 2022-05-02 LAB — COMPREHENSIVE METABOLIC PANEL
ALT: 12 IU/L (ref 0–32)
AST: 17 IU/L (ref 0–40)
Albumin/Globulin Ratio: 1.7 (ref 1.2–2.2)
Albumin: 3.8 g/dL (ref 3.8–4.8)
Alkaline Phosphatase: 115 IU/L (ref 44–121)
BUN/Creatinine Ratio: 15 (ref 12–28)
BUN: 32 mg/dL — ABNORMAL HIGH (ref 8–27)
Bilirubin Total: 0.3 mg/dL (ref 0.0–1.2)
CO2: 17 mmol/L — ABNORMAL LOW (ref 20–29)
Calcium: 9.6 mg/dL (ref 8.7–10.3)
Chloride: 106 mmol/L (ref 96–106)
Creatinine, Ser: 2.16 mg/dL — ABNORMAL HIGH (ref 0.57–1.00)
Globulin, Total: 2.3 g/dL (ref 1.5–4.5)
Glucose: 128 mg/dL — ABNORMAL HIGH (ref 70–99)
Potassium: 5.3 mmol/L — ABNORMAL HIGH (ref 3.5–5.2)
Sodium: 139 mmol/L (ref 134–144)
Total Protein: 6.1 g/dL (ref 6.0–8.5)
eGFR: 23 mL/min/{1.73_m2} — ABNORMAL LOW (ref >59)

## 2022-05-02 LAB — CBC
Hematocrit: 34.3 % (ref 34.0–46.6)
Hemoglobin: 11.6 g/dL (ref 11.1–15.9)
MCH: 33.3 pg — ABNORMAL HIGH (ref 26.6–33.0)
MCHC: 33.8 g/dL (ref 31.5–35.7)
MCV: 99 fL — ABNORMAL HIGH (ref 79–97)
Platelets: 246 10*3/uL (ref 150–450)
RBC: 3.48 x10E6/uL — ABNORMAL LOW (ref 3.77–5.28)
RDW: 13.1 % (ref 11.7–15.4)
WBC: 8.2 10*3/uL (ref 3.4–10.8)

## 2022-05-02 LAB — HEMOGLOBIN A1C
Est. average glucose Bld gHb Est-mCnc: 128 mg/dL
Hgb A1c MFr Bld: 6.1 % — ABNORMAL HIGH (ref 4.8–5.6)

## 2022-05-02 LAB — TSH: TSH: 3.73 u[IU]/mL (ref 0.450–4.500)

## 2022-05-02 NOTE — Progress Notes (Signed)
Improved cell counts and anemia levels. Reassuring given acute appendicitis last month. Kidney function remains stable as well; eGFR 23. Borderline elevated potassium; be mindful of potassium intake in diet and ensure hydration with water. A1c continues to remain stable; 6.1%.

## 2022-05-07 DIAGNOSIS — H524 Presbyopia: Secondary | ICD-10-CM | POA: Diagnosis not present

## 2022-05-07 DIAGNOSIS — E113393 Type 2 diabetes mellitus with moderate nonproliferative diabetic retinopathy without macular edema, bilateral: Secondary | ICD-10-CM | POA: Diagnosis not present

## 2022-05-18 ENCOUNTER — Inpatient Hospital Stay: Payer: No Typology Code available for payment source | Attending: Oncology

## 2022-05-18 DIAGNOSIS — I129 Hypertensive chronic kidney disease with stage 1 through stage 4 chronic kidney disease, or unspecified chronic kidney disease: Secondary | ICD-10-CM | POA: Diagnosis not present

## 2022-05-18 DIAGNOSIS — Z90710 Acquired absence of both cervix and uterus: Secondary | ICD-10-CM | POA: Diagnosis not present

## 2022-05-18 DIAGNOSIS — D472 Monoclonal gammopathy: Secondary | ICD-10-CM

## 2022-05-18 DIAGNOSIS — C9000 Multiple myeloma not having achieved remission: Secondary | ICD-10-CM | POA: Insufficient documentation

## 2022-05-18 DIAGNOSIS — E1122 Type 2 diabetes mellitus with diabetic chronic kidney disease: Secondary | ICD-10-CM | POA: Diagnosis not present

## 2022-05-18 DIAGNOSIS — N184 Chronic kidney disease, stage 4 (severe): Secondary | ICD-10-CM | POA: Insufficient documentation

## 2022-05-18 LAB — CBC WITH DIFFERENTIAL/PLATELET
Abs Immature Granulocytes: 0.03 10*3/uL (ref 0.00–0.07)
Basophils Absolute: 0.1 10*3/uL (ref 0.0–0.1)
Basophils Relative: 1 %
Eosinophils Absolute: 0.4 10*3/uL (ref 0.0–0.5)
Eosinophils Relative: 5 %
HCT: 34.3 % — ABNORMAL LOW (ref 36.0–46.0)
Hemoglobin: 11.3 g/dL — ABNORMAL LOW (ref 12.0–15.0)
Immature Granulocytes: 0 %
Lymphocytes Relative: 33 %
Lymphs Abs: 2.6 10*3/uL (ref 0.7–4.0)
MCH: 33.7 pg (ref 26.0–34.0)
MCHC: 32.9 g/dL (ref 30.0–36.0)
MCV: 102.4 fL — ABNORMAL HIGH (ref 80.0–100.0)
Monocytes Absolute: 0.6 10*3/uL (ref 0.1–1.0)
Monocytes Relative: 7 %
Neutro Abs: 4.2 10*3/uL (ref 1.7–7.7)
Neutrophils Relative %: 54 %
Platelets: 214 10*3/uL (ref 150–400)
RBC: 3.35 MIL/uL — ABNORMAL LOW (ref 3.87–5.11)
RDW: 13.4 % (ref 11.5–15.5)
WBC: 7.7 10*3/uL (ref 4.0–10.5)
nRBC: 0 % (ref 0.0–0.2)

## 2022-05-18 LAB — COMPREHENSIVE METABOLIC PANEL
ALT: 15 U/L (ref 0–44)
AST: 20 U/L (ref 15–41)
Albumin: 3.7 g/dL (ref 3.5–5.0)
Alkaline Phosphatase: 97 U/L (ref 38–126)
Anion gap: 9 (ref 5–15)
BUN: 31 mg/dL — ABNORMAL HIGH (ref 8–23)
CO2: 21 mmol/L — ABNORMAL LOW (ref 22–32)
Calcium: 9.4 mg/dL (ref 8.9–10.3)
Chloride: 104 mmol/L (ref 98–111)
Creatinine, Ser: 2.22 mg/dL — ABNORMAL HIGH (ref 0.44–1.00)
GFR, Estimated: 22 mL/min — ABNORMAL LOW (ref >60)
Glucose, Bld: 84 mg/dL (ref 70–99)
Potassium: 5.1 mmol/L (ref 3.5–5.1)
Sodium: 134 mmol/L — ABNORMAL LOW (ref 135–145)
Total Bilirubin: 0.7 mg/dL (ref 0.3–1.2)
Total Protein: 6.6 g/dL (ref 6.5–8.1)

## 2022-05-21 LAB — IFE+PROTEIN ELECTRO, 24-HR UR
% BETA, Urine: 55.9 %
ALPHA 1 URINE: 6.2 %
Albumin, U: 25.1 %
Alpha 2, Urine: 8.6 %
GAMMA GLOBULIN URINE: 4.3 %
M-SPIKE %, Urine: 34 % — ABNORMAL HIGH
M-Spike, Mg/24 Hr: 83 mg/24 hr — ABNORMAL HIGH
Total Protein, Urine-Ur/day: 244 mg/24 hr — ABNORMAL HIGH (ref 30–150)
Total Protein, Urine: 10.4 mg/dL
Total Volume: 2350

## 2022-05-21 LAB — KAPPA/LAMBDA LIGHT CHAINS
Kappa free light chain: 153.7 mg/L — ABNORMAL HIGH (ref 3.3–19.4)
Kappa, lambda light chain ratio: 4.91 — ABNORMAL HIGH (ref 0.26–1.65)
Lambda free light chains: 31.3 mg/L — ABNORMAL HIGH (ref 5.7–26.3)

## 2022-05-24 LAB — MULTIPLE MYELOMA PANEL, SERUM
Albumin SerPl Elph-Mcnc: 3.5 g/dL (ref 2.9–4.4)
Albumin/Glob SerPl: 1.3 (ref 0.7–1.7)
Alpha 1: 0.2 g/dL (ref 0.0–0.4)
Alpha2 Glob SerPl Elph-Mcnc: 1.1 g/dL — ABNORMAL HIGH (ref 0.4–1.0)
B-Globulin SerPl Elph-Mcnc: 0.8 g/dL (ref 0.7–1.3)
Gamma Glob SerPl Elph-Mcnc: 0.6 g/dL (ref 0.4–1.8)
Globulin, Total: 2.8 g/dL (ref 2.2–3.9)
IgA: 541 mg/dL — ABNORMAL HIGH (ref 64–422)
IgG (Immunoglobin G), Serum: 554 mg/dL — ABNORMAL LOW (ref 586–1602)
IgM (Immunoglobulin M), Srm: 124 mg/dL (ref 26–217)
M Protein SerPl Elph-Mcnc: 0.5 g/dL — ABNORMAL HIGH
Total Protein ELP: 6.3 g/dL (ref 6.0–8.5)

## 2022-05-25 ENCOUNTER — Encounter: Payer: Self-pay | Admitting: Oncology

## 2022-05-25 ENCOUNTER — Inpatient Hospital Stay: Payer: No Typology Code available for payment source | Admitting: Oncology

## 2022-05-25 VITALS — BP 155/81 | HR 77 | Temp 96.8°F | Wt 195.1 lb

## 2022-05-25 DIAGNOSIS — C9000 Multiple myeloma not having achieved remission: Secondary | ICD-10-CM

## 2022-05-25 DIAGNOSIS — N184 Chronic kidney disease, stage 4 (severe): Secondary | ICD-10-CM

## 2022-05-25 DIAGNOSIS — D72820 Lymphocytosis (symptomatic): Secondary | ICD-10-CM | POA: Diagnosis not present

## 2022-05-25 NOTE — Assessment & Plan Note (Signed)
Encourage oral hydration avoid nephrotoxins.  Continue follow-up with nephrology.

## 2022-05-25 NOTE — Progress Notes (Signed)
Hematology/Oncology Progress note [phone] [phone]      CHIEF COMPLAINTS/REASON FOR VISIT:  Smoldering Multiple myeloma, monoclonal lymphocytosis.   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  M protein on UPEP has decreased.  Patient has no  hypercalcemia, Hb>10, no bone lesions on x-ray skeletal survey as well as PET scan.  Repeat skeletal survey  Patient's kidney function has been stable We discussed that it is difficult to distinguish if her renal function impairment is due to DM or light chain kidney disease.  Option of kidney biopsy was discussed and she is not interested.  Given that she is fairly symptomatic, I recommend continue observation.follow up in 3 months.   Monoclonal B-cell lymphocytosis of unknown significance Continue observation.  Stable and normal total WBC and lymphocyte count.  Stage 4 chronic kidney disease ([place]) Encourage oral hydration avoid nephrotoxins.  Continue follow-up with nephrology.  Orders Placed This Encounter  Procedures   DG Bone Survey Met    Standing Status:   Future    Standing Expiration Date:   05/26/2023    Order Specific Question:   Reason for exam:    Answer:   multiple myeloma    Order Specific Question:   Preferred imaging location?    Answer:   [facility]   CBC with Differential ([place] Only)    Standing Status:   Future    Standing Expiration Date:   05/26/2023   CMP ([place] only)    Standing Status:   Future    Standing  Expiration Date:   05/26/2023   IFE+PROTEIN ELECTRO, 24-HR UR    Standing Status:   Future    Standing Expiration Date:   05/26/2023   Kappa/lambda light chains    Standing Status:   Future    Standing Expiration Date:   05/25/2023   Multiple Myeloma Panel (SPEP&IFE w/QIG)    Standing Status:   Future    Standing Expiration Date:   05/25/2023   Follow-up in 4 months. All questions were answered. The patient knows to call the clinic with any problems, questions or concerns.  Earlie Server, MD, PhD [facility] Hematology Oncology 05/25/2022     HISTORY OF PRESENTING ILLNESS:  Donna Malone is a  77 y.o.  female with PMH listed below who was referred to me for evaluation of leukocytosis Reviewed patient' recent labs obtained by PCP.   10/22/2019 CBC showed elevated white count of 11.3, predominantly lymphocytosis.  Normal hemoglobin and platelet count. Previous lab records reviewed. Leukocytosis onset of chronic, duration is since January 2021.   No aggravating or elevated factors. Associated symptoms or signs:  Denies weight loss, fever, chills,night sweats.  Endorses fatigue Smoking history: Never smoker History of recent oral steroid use or steroid injection: No recent steroid injections. History of recent infection: Denies Autoimmune disease history.  Denies  Patient denies any chronic wound, prosthesis.  She has chronic knee arthritis.  #  flow cytometry showed CD5+ , CD23 + B-cell population.  CLL/SLL phenotype.  CD38 negative, <1% leukocytes,<5000 cells/ul # 06/29/2021, bone marrow biopsy showed  hyper cellular marrow with plasma cell neoplasm, 10 to 15% plasma cell infiltrates, predominant.  Cytogenetics is normal.  Multiple myeloma FISH panel normal.  Standard risk.  Patient takes Lasix  for lower extremity swelling. INTERVAL HISTORY Donna Malone is a 77 y.o. female who has above history reviewed by me today presents for follow up smothering multiple myeloma. + she has  no new complaints today.   Review of Systems  Constitutional:  Positive for fatigue. Negative for appetite change, chills and fever.  HENT:   Negative for hearing loss and voice change.   Eyes:  Negative for eye problems.  Respiratory:  Negative for chest tightness and cough.   Cardiovascular:  Negative for chest pain.  Gastrointestinal:  Negative for abdominal distention, abdominal pain and blood in stool.  Endocrine: Negative for hot flashes.  Genitourinary:  Negative for difficulty urinating and frequency.   Musculoskeletal:  Positive for arthralgias.  Skin:  Negative for itching and rash.  Neurological:  Negative for extremity weakness.  Hematological:  Negative for adenopathy.  Psychiatric/Behavioral:  Negative for confusion.      MEDICAL HISTORY:  Past Medical History:  Diagnosis Date   Bone spur    heel   Depression    Diabetes mellitus without complication ([place])    Gout    Hypertension     SURGICAL HISTORY: Past Surgical History:  Procedure Laterality Date   ABDOMINAL HYSTERECTOMY  04/1970   partial   CHOLECYSTECTOMY  late 1990's   COLONOSCOPY WITH PROPOFOL N/A 10/09/2021   Procedure: COLONOSCOPY WITH PROPOFOL;  Surgeon: Jonathon Bellows, MD;  Location: [facility] ENDOSCOPY;  Service: Gastroenterology;  Laterality: N/A;   LAPAROSCOPIC APPENDECTOMY N/A 03/14/2022   Procedure: APPENDECTOMY LAPAROSCOPIC;  Surgeon: Olean Ree, MD;  Location: [facility] ORS;  Service: General;  Laterality: N/A;    SOCIAL HISTORY: Social History   Socioeconomic History   Marital status: Widowed    Spouse name: [name]   Number of children: 3   Years of education: Not on file   Highest education level: 12th grade  Occupational History   Occupation: retired  Tobacco Use   Smoking status: Never   Smokeless tobacco: Never  Vaping Use   Vaping Use: Never used  Substance and Sexual Activity   Alcohol use: No   Drug use: No   Sexual activity: Not Currently  Other Topics Concern   Not on file   Social History Narrative   Not on file   Social Determinants of Health   Financial Resource Strain: Low Risk  (04/30/2022)   Overall Financial Resource Strain (CARDIA)    Difficulty of Paying Living Expenses: Not hard at all  Food Insecurity: No Food Insecurity (04/30/2022)   Hunger Vital Sign    Worried About Running Out of Food in the Last Year: Never true    [place] in the Last Year: Never true  Transportation Needs: No Transportation Needs (04/30/2022)   PRAPARE - Hydrologist (Medical): No    Lack of Transportation (Non-Medical): No  Physical Activity: Inactive (04/30/2022)   Exercise Vital Sign    Days of Exercise per Week: 0 days    Minutes of Exercise per Session: 0 min  Stress: No Stress Concern Present (04/30/2022)   [place]    Feeling of Stress : Not at all  Social Connections: Moderately Isolated (04/30/2022)   Social Connection and Isolation Panel [NHANES]    Frequency of Communication  with Friends and Family: More than three times a week    Frequency of Social Gatherings with Friends and Family: Twice a week    Attends Religious Services: More than 4 times per year    Active Member of [employer] or Organizations: No    Attends Archivist Meetings: Never    Marital Status: Widowed  Intimate Partner Violence: Not At Risk (04/30/2022)   Humiliation, Afraid, Rape, and Kick questionnaire    Fear of Current or Ex-Partner: No    Emotionally Abused: No    Physically Abused: No    Sexually Abused: No    FAMILY HISTORY: Family History  Problem Relation Age of Onset   CAD Mother    Heart attack Mother    Heart disease Father    Diabetes Cousin    Cancer Cousin     ALLERGIES:  is allergic to sulfa antibiotics.  MEDICATIONS:  Current Outpatient Medications  Medication Sig Dispense Refill   Accu-Chek Softclix Lancets lancets Use as instructed 100 each 12    acetaminophen (TYLENOL) 500 MG tablet Take 500 mg by mouth every 6 (six) hours as needed for mild pain or headache.     allopurinol (ZYLOPRIM) 100 MG tablet TAKE 1 TABLET BY MOUTH EVERY DAY (Patient taking differently: Take 100 mg by mouth daily.) 90 tablet 1   atorvastatin (LIPITOR) 40 MG tablet TAKE 1 TABLET BY MOUTH EVERY DAY 90 tablet 0   B Complex Vitamins (GNP VITAMIN B COMPLEX PO) Take 1 tablet by mouth daily.     blood glucose meter kit and supplies Dispense based on patient and insurance preference. Use once daily for FBG. (FOR ICD-10 E10.9, E11.9). 1 each 0   buPROPion (WELLBUTRIN XL) 150 MG 24 hr tablet TAKE 1 TABLET BY MOUTH EVERY DAY (Patient taking differently: Take 150 mg by mouth daily.) 90 tablet 1   escitalopram (LEXAPRO) 20 MG tablet TAKE 1 TABLET BY MOUTH EVERY DAY (Patient taking differently: Take 20 mg by mouth daily.) 90 tablet 0   famotidine (PEPCID) 40 MG tablet TAKE 1 TABLET BY MOUTH EVERY DAY (Patient taking differently: Take 40 mg by mouth daily.) 90 tablet 3   ferrous sulfate 325 (65 FE) MG EC tablet Take 325 mg by mouth daily.     glipiZIDE (GLUCOTROL XL) 10 MG 24 hr tablet Take 1 tablet (10 mg total) by mouth daily with breakfast. 90 tablet 0   glucose blood (ACCU-CHEK GUIDE) test strip Use as instructed 100 each 12   KRILL OIL PO Take by mouth.     levothyroxine (SYNTHROID) 75 MCG tablet TAKE 1 TABLET BY MOUTH EVERY DAY (Patient taking differently: Take 75 mcg by mouth daily before breakfast.) 90 tablet 3   MAGNESIUM-OXIDE PO Take by mouth.     meclizine (ANTIVERT) 25 MG tablet Take 1-2 tablets (25-50 mg total) by mouth 3 (three) times daily as needed for dizziness. 30 tablet 0   Multiple Vitamins-Minerals (PRESERVISION AREDS 2) CAPS Take 1 capsule by mouth in the morning and at bedtime.     Polyethyl Glycol-Propyl Glycol (SYSTANE HYDRATION PF OP) Place 1 drop into both eyes daily as needed (for dry eyes).     traZODone (DESYREL) 50 MG tablet TAKE 1 TABLET BY MOUTH  EVERYDAY AT BEDTIME 90 tablet 0   Vitamin D, Ergocalciferol, (DRISDOL) 1.25 MG (50000 UNIT) CAPS capsule TAKE 1 CAPSULE (50,000 UNITS TOTAL) BY MOUTH EVERY 7 (SEVEN) DAYS (Patient taking differently: Take 50,000 Units by mouth every 7 (seven) days.  Sunday) 12 capsule 0   No current facility-administered medications for this visit.     PHYSICAL EXAMINATION: ECOG PERFORMANCE STATUS: 1 - Symptomatic but completely ambulatory Vitals:   05/25/22 0939  BP: (!) 155/81  Pulse: 77  Temp: (!) 96.8 F (36 C)  SpO2: 99%   Filed Weights   05/25/22 0939  Weight: 195 lb 1.6 oz (88.5 kg)    Physical Exam Constitutional:      General: She is not in acute distress.    Comments: Patient walks independently  HENT:     Head: Normocephalic and atraumatic.  Eyes:     General: No scleral icterus. Cardiovascular:     Rate and Rhythm: Normal rate and regular rhythm.     Heart sounds: Normal heart sounds.  Pulmonary:     Effort: Pulmonary effort is normal. No respiratory distress.     Breath sounds: No wheezing.  Abdominal:     General: Bowel sounds are normal. There is no distension.     Palpations: Abdomen is soft.  Musculoskeletal:        General: No deformity. Normal range of motion.     Cervical back: Normal range of motion and neck supple.  Skin:    General: Skin is warm and dry.     Findings: No erythema or rash.  Neurological:     Mental Status: She is alert and oriented to person, place, and time. Mental status is at baseline.     Cranial Nerves: No cranial nerve deficit.     Coordination: Coordination normal.  Psychiatric:        Mood and Affect: Mood normal.        Latest Ref Rng & Units 05/18/2022   11:26 AM  CMP  Glucose 70 - 99 mg/dL 84   BUN 8 - 23 mg/dL 31   Creatinine 0.44 - 1.00 mg/dL 2.22   Sodium 135 - 145 mmol/L 134   Potassium 3.5 - 5.1 mmol/L 5.1   Chloride 98 - 111 mmol/L 104   CO2 22 - 32 mmol/L 21   Calcium 8.9 - 10.3 mg/dL 9.4   Total Protein 6.5 - 8.1  g/dL 6.6   Total Bilirubin 0.3 - 1.2 mg/dL 0.7   Alkaline Phos 38 - 126 U/L 97   AST 15 - 41 U/L 20   ALT 0 - 44 U/L 15       Latest Ref Rng & Units 05/18/2022   11:26 AM  CBC  WBC 4.0 - 10.5 K/uL 7.7   Hemoglobin 12.0 - 15.0 g/dL 11.3   Hematocrit 36.0 - 46.0 % 34.3   Platelets 150 - 400 K/uL 214      RADIOGRAPHIC STUDIES: I have personally reviewed the radiological images as listed and agreed with the findings in the report. No results found.  LABORATORY DATA:  I have reviewed the data as listed Lab Results  Component Value Date   WBC 7.7 05/18/2022   HGB 11.3 (L) 05/18/2022   HCT 34.3 (L) 05/18/2022   MCV 102.4 (H) 05/18/2022   PLT 214 05/18/2022   Recent Labs    03/09/22 1124 03/14/22 0253 05/01/22 1355 05/18/22 1126  NA 138 138 139 134*  K 4.0 4.4 5.3* 5.1  CL 115* 112* 106 104  CO2 17* 19* 17* 21*  GLUCOSE 156* 157* 128* 84  BUN 40* 36* 32* 31*  CREATININE 2.38* 2.36* 2.16* 2.22*  CALCIUM 9.6 9.7 9.6 9.4  GFRNONAA 20* 21*  --  22*  PROT  --  7.1 6.1 6.6  ALBUMIN 3.8 3.9 3.8 3.7  AST  --  '23 17 20  '$ ALT  --  '19 12 15  '$ ALKPHOS  --  100 115 97  BILITOT  --  0.8 0.3 0.7    Iron/TIBC/Ferritin/ %Sat    Component Value Date/Time   IRON 56 04/23/2017 1644   TIBC 362 04/23/2017 1644   FERRITIN 19 04/23/2017 1644   IRONPCTSAT 15 04/23/2017 1644

## 2022-05-25 NOTE — Assessment & Plan Note (Addendum)
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  M protein on UPEP has decreased.  Patient has no  hypercalcemia, Hb>10, no bone lesions on x-ray skeletal survey as well as PET scan.  Repeat skeletal survey  Patient's kidney function has been stable We discussed that it is difficult to distinguish if her renal function impairment is due to DM or light chain kidney disease.  Option of kidney biopsy was discussed and she is not interested.  Given that she is fairly symptomatic, I recommend continue observation.follow up in 3 months.

## 2022-05-25 NOTE — Assessment & Plan Note (Signed)
Continue observation.  Stable and normal total WBC and lymphocyte count.

## 2022-05-25 NOTE — Patient Instructions (Signed)
Skeletal survey (bone survey)- this scan is a walk- in meaning that you do not need appointment. You may go to the medical mall to have this scan done at your own convenience.

## 2022-06-10 ENCOUNTER — Other Ambulatory Visit: Payer: Self-pay | Admitting: Family Medicine

## 2022-06-10 DIAGNOSIS — K219 Gastro-esophageal reflux disease without esophagitis: Secondary | ICD-10-CM

## 2022-06-11 NOTE — Telephone Encounter (Signed)
Requested Prescriptions  Pending Prescriptions Disp Refills   famotidine (PEPCID) 40 MG tablet [Pharmacy Med Name: FAMOTIDINE 40 MG TABLET] 90 tablet 3    Sig: TAKE 1 TABLET BY MOUTH EVERY DAY     Gastroenterology:  H2 Antagonists Passed - 06/10/2022  8:36 AM      Passed - Valid encounter within last 12 months    Recent Outpatient Visits           1 month ago Annual physical exam   [facility] Tally Joe T, [name]   7 months ago Type 2 diabetes mellitus with stage 4 chronic kidney disease, with long-term current use of insulin [facility])   [place] Tally Joe T, [name]   10 months ago Type 2 diabetes mellitus with stage 3b chronic kidney disease, without long-term current use of insulin [facility])   [place] Tally Joe T, [name]   11 months ago Depression, recurrent [facility])   [place] Tally Joe T, [name]   1 year ago Hypertension associated with diabetes [facility])   [place] [name], [name]

## 2022-07-01 ENCOUNTER — Other Ambulatory Visit: Payer: Self-pay | Admitting: Family Medicine

## 2022-07-01 DIAGNOSIS — E038 Other specified hypothyroidism: Secondary | ICD-10-CM

## 2022-07-01 DIAGNOSIS — F5104 Psychophysiologic insomnia: Secondary | ICD-10-CM

## 2022-07-05 ENCOUNTER — Other Ambulatory Visit: Payer: Self-pay | Admitting: Family Medicine

## 2022-07-05 DIAGNOSIS — E559 Vitamin D deficiency, unspecified: Secondary | ICD-10-CM

## 2022-07-05 NOTE — Telephone Encounter (Signed)
Requested medication (s) are due for refill today: yes  Requested medication (s) are on the active medication list: yes  Last refill:  02/09/22  Future visit scheduled: yes  Notes to clinic:   Manual Review: Route requests for 50,000 IU strength to the provider.     Requested Prescriptions  Pending Prescriptions Disp Refills   Vitamin D, Ergocalciferol, (DRISDOL) 1.25 MG (50000 UNIT) CAPS capsule [Pharmacy Med Name: VITAMIN D2 1.25MG (50,000 UNIT)] 12 capsule 0    Sig: TAKE 1 CAPSULE (50,000 UNITS TOTAL) BY MOUTH EVERY 7 (SEVEN) DAYS     Endocrinology:  Vitamins - Vitamin D Supplementation 2 Failed - 07/05/2022  1:33 PM      Failed - Manual Review: Route requests for 50,000 IU strength to the provider      Failed - Vitamin D in normal range and within 360 days    Vit D, 25-Hydroxy  Date Value Ref Range Status  09/28/2020 81.9 30.0 - 100.0 ng/mL Final    Comment:    Vitamin D deficiency has been defined by the Institute of Medicine and an Endocrine Society practice guideline as a level of serum 25-OH vitamin D less than 20 ng/mL (1,2). The Endocrine Society went on to further define vitamin D insufficiency as a level between 21 and 29 ng/mL (2). 1. IOM (Institute of Medicine). 2010. Dietary reference    intakes for calcium and D. [place]: The    [employer]. 2. Holick MF, Binkley [place], Bischoff-Ferrari HA, et al.    Evaluation, treatment, and prevention of vitamin D    deficiency: an Endocrine Society clinical practice    guideline. JCEM. 2011 Jul; 96(7):1911-30.          Passed - Ca in normal range and within 360 days    Calcium  Date Value Ref Range Status  05/18/2022 9.4 8.9 - 10.3 mg/dL Final         Passed - Valid encounter within last 12 months    Recent Outpatient Visits           2 months ago Annual physical exam   [facility]ally Joe T, [name]   8 months ago Type 2 diabetes mellitus with stage 4 chronic kidney  disease, with long-term current use of insulin [facility])   [place] Tally Joe T, [name]   11 months ago Type 2 diabetes mellitus with stage 3b chronic kidney disease, without long-term current use of insulin [facility])   [place] Tally Joe T, [name]   1 year ago Depression, recurrent [facility])   [place] Tally Joe T, [name]   1 year ago Hypertension associated with diabetes [facility])   [place] [name], [name]

## 2022-07-10 DIAGNOSIS — H26493 Other secondary cataract, bilateral: Secondary | ICD-10-CM | POA: Diagnosis not present

## 2022-07-10 DIAGNOSIS — H18413 Arcus senilis, bilateral: Secondary | ICD-10-CM | POA: Diagnosis not present

## 2022-07-10 DIAGNOSIS — Z961 Presence of intraocular lens: Secondary | ICD-10-CM | POA: Diagnosis not present

## 2022-07-10 DIAGNOSIS — H26492 Other secondary cataract, left eye: Secondary | ICD-10-CM | POA: Diagnosis not present

## 2022-07-10 DIAGNOSIS — E119 Type 2 diabetes mellitus without complications: Secondary | ICD-10-CM | POA: Diagnosis not present

## 2022-07-23 ENCOUNTER — Ambulatory Visit
Admission: RE | Admit: 2022-07-23 | Discharge: 2022-07-23 | Disposition: A | Discharge status: Home / Self Care | Payer: No Typology Code available for payment source | Source: Ambulatory Visit | Attending: Oncology | Admitting: Oncology

## 2022-07-23 DIAGNOSIS — D72820 Lymphocytosis (symptomatic): Secondary | ICD-10-CM | POA: Insufficient documentation

## 2022-07-23 DIAGNOSIS — C9000 Multiple myeloma not having achieved remission: Secondary | ICD-10-CM | POA: Insufficient documentation

## 2022-07-25 ENCOUNTER — Other Ambulatory Visit: Payer: Self-pay | Admitting: Family Medicine

## 2022-07-25 DIAGNOSIS — M109 Gout, unspecified: Secondary | ICD-10-CM

## 2022-07-25 DIAGNOSIS — E1122 Type 2 diabetes mellitus with diabetic chronic kidney disease: Secondary | ICD-10-CM

## 2022-07-25 DIAGNOSIS — F3342 Major depressive disorder, recurrent, in full remission: Secondary | ICD-10-CM

## 2022-07-25 DIAGNOSIS — E7800 Pure hypercholesterolemia, unspecified: Secondary | ICD-10-CM

## 2022-07-25 DIAGNOSIS — F5104 Psychophysiologic insomnia: Secondary | ICD-10-CM

## 2022-07-25 DIAGNOSIS — E559 Vitamin D deficiency, unspecified: Secondary | ICD-10-CM

## 2022-07-25 NOTE — Telephone Encounter (Signed)
Requested medication (s) are due for refill today: see below  Requested medication (s) are on the active medication list: yes  Last refill:  01/17/22  Future visit scheduled: no  Notes to clinic:  Pharmacy comment: Alternative Requested:THESE TEST STRIPS ARE NO LONGER COVERED BY PATIENT'S INSURANCE. WOULD YOU PLEASE SEND IN NEW RX FOR ONES THAT ARE COVERED ALONG WITH METER AND LANCETS. THANKS.      Requested Prescriptions  Pending Prescriptions Disp Refills   ACCU-CHEK GUIDE test strip [Pharmacy Med Name: ACCU-CHEK GUIDE TEST STRIP] 100 strip 12    Sig: Use as instructed     Endocrinology: Diabetes - Testing Supplies Passed - 07/25/2022  3:11 PM      Passed - Valid encounter within last 12 months    Recent Outpatient Visits           2 months ago Annual physical exam   [facility] [name], [name]   8 months ago Type 2 diabetes mellitus with stage 4 chronic kidney disease, with long-term current use of insulin [facility])   [place] [facility] [name], [name]   11 months ago Type 2 diabetes mellitus with stage 3b chronic kidney disease, without long-term current use of insulin [facility])   [facility] [name], [name]   1 year ago Depression, recurrent [facility])   [place] [facility] [name], [name]   1 year ago Hypertension associated with diabetes [facility])   [facility] [name], [name]

## 2022-07-25 NOTE — Telephone Encounter (Signed)
Requested Prescriptions  Pending Prescriptions Disp Refills   allopurinol (ZYLOPRIM) 100 MG tablet [Pharmacy Med Name: ALLOPURINOL 100 MG TABLET] 90 tablet 1    Sig: TAKE 1 TABLET BY MOUTH EVERY DAY     Endocrinology:  Gout Agents - allopurinol Failed - 07/25/2022 12:18 PM      Failed - Uric Acid in normal range and within 360 days    Uric Acid, Serum  Date Value Ref Range Status  10/29/2015 11.3 (H) 2.3 - 6.6 mg/dL Final         Failed - Cr in normal range and within 360 days    Creatinine, Ser  Date Value Ref Range Status  05/18/2022 2.22 (H) 0.44 - 1.00 mg/dL Final   Creatinine, Urine  Date Value Ref Range Status  03/09/2022 130 mg/dL Final         Passed - Valid encounter within last 12 months    Recent Outpatient Visits           2 months ago Annual physical exam   [facility] [name], [name]   8 months ago Type 2 diabetes mellitus with stage 4 chronic kidney disease, with long-term current use of insulin (HCC)   [place] [facility] [name], [name]   11 months ago Type 2 diabetes mellitus with stage 3b chronic kidney disease, without long-term current use of insulin (HCC)   [place] [facility] [name], [name]   1 year ago Depression, recurrent [facility])   [place] [facility] [name], [name]   1 year ago Hypertension associated with diabetes [facility])   [place] [facility] [name], [name]              Passed - CBC within normal limits and completed in the last 12 months    WBC  Date Value Ref Range Status  05/18/2022 7.7 4.0 - 10.5 K/uL Final   RBC  Date Value Ref Range Status  05/18/2022 3.35 (L) 3.87 - 5.11 MIL/uL Final   Hemoglobin  Date Value Ref Range Status  05/18/2022 11.3 (L) 12.0 - 15.0 g/dL Final  16/01/9603 54.0 11.1 - 15.9 g/dL Final   HCT  Date Value Ref Range Status  05/18/2022 34.3 (L) 36.0 - 46.0 % Final   Hematocrit  Date  Value Ref Range Status  05/01/2022 34.3 34.0 - 46.6 % Final   MCHC  Date Value Ref Range Status  05/18/2022 32.9 30.0 - 36.0 g/dL Final   [facility]  Date Value Ref Range Status  05/18/2022 33.7 26.0 - 34.0 pg Final   MCV  Date Value Ref Range Status  05/18/2022 102.4 (H) 80.0 - 100.0 fL Final  05/01/2022 99 (H) 79 - 97 fL Final   No results found for: "PLTCOUNTKUC", "LABPLAT", "POCPLA" RDW  Date Value Ref Range Status  05/18/2022 13.4 11.5 - 15.5 % Final  05/01/2022 13.1 11.7 - 15.4 % Final          Vitamin D, Ergocalciferol, (DRISDOL) 1.25 MG (50000 UNIT) CAPS capsule [Pharmacy Med Name: VITAMIN D2 1.25MG (50,000 UNIT)] 13 capsule 0    Sig: TAKE 1 CAPSULE (50,000 UNITS TOTAL) BY MOUTH EVERY 7 (SEVEN) DAYS. SUNDAY     Endocrinology:  Vitamins - Vitamin D Supplementation 2 Failed - 07/25/2022 12:18 PM      Failed - Manual Review: Route requests for 50,000 IU strength to the provider      Failed -  Vitamin D in normal range and within 360 days    Vit D, 25-Hydroxy  Date Value Ref Range Status  09/28/2020 81.9 30.0 - 100.0 ng/mL Final    Comment:    Vitamin D deficiency has been defined by the Institute of Medicine and an Endocrine Society practice guideline as a level of serum 25-OH vitamin D less than 20 ng/mL (1,2). The Endocrine Society went on to further define vitamin D insufficiency as a level between 21 and 29 ng/mL (2). 1. IOM (Institute of Medicine). 2010. Dietary reference    intakes for calcium and D. [place]: The    [employer]. 2. Holick MF, Binkley [place], Bischoff-Ferrari HA, et al.    Evaluation, treatment, and prevention of vitamin D    deficiency: an Endocrine Society clinical practice    guideline. JCEM. 2011 Jul; 96(7):1911-30.          Passed - Ca in normal range and within 360 days    Calcium  Date Value Ref Range Status  05/18/2022 9.4 8.9 - 10.3 mg/dL Final         Passed - Valid encounter within last 12 months    Recent  Outpatient Visits           2 months ago Annual physical exam   [name], [name]   8 months ago Type 2 diabetes mellitus with stage 4 chronic kidney disease, with long-term current use of insulin [facility])   [place] [facility] [name], [name]   11 months ago Type 2 diabetes mellitus with stage 3b chronic kidney disease, without long-term current use of insulin (HCC)   [place] [facility] [name], [name]   1 year ago Depression, recurrent [facility])   [name], [name]   1 year ago Hypertension associated with diabetes [facility])   [facility] [name], [name]               traZODone (DESYREL) 50 MG tablet [Pharmacy Med Name: TRAZODONE 50 MG TABLET] 90 tablet 0    Sig: TAKE 1 TABLET BY MOUTH EVERYDAY AT BEDTIME     Psychiatry: Antidepressants - Serotonin Modulator Passed - 07/25/2022 12:18 PM      Passed - Completed PHQ-2 or PHQ-9 in the last 360 days      Passed - Valid encounter within last 6 months    Recent Outpatient Visits           2 months ago Annual physical exam   [facility] [name], [name]   8 months ago Type 2 diabetes mellitus with stage 4 chronic kidney disease, with long-term current use of insulin [facility])   [place] [name], [name]   11 months ago Type 2 diabetes mellitus with stage 3b chronic kidney disease, without long-term current use of insulin [facility])   [place] [facility] [name], [name]   1 year ago Depression, recurrent [facility])   [place] [facility] [name], [name]   1 year ago Hypertension associated with diabetes [facility])    [facility] [name], [place]              Signed Prescriptions Disp Refills   atorvastatin (LIPITOR) 40 MG tablet 90 tablet 1    Sig: TAKE 1 TABLET  BY MOUTH  EVERY DAY     Cardiovascular:  Antilipid - Statins Failed - 07/25/2022 12:18 PM      Failed - Lipid Panel in normal range within the last 12 months    Cholesterol, Total  Date Value Ref Range Status  05/01/2022 208 (H) 100 - 199 mg/dL Final   LDL Chol Calc (NIH)  Date Value Ref Range Status  05/01/2022 111 (H) 0 - 99 mg/dL Final   HDL  Date Value Ref Range Status  05/01/2022 37 (L) >39 mg/dL Final   Triglycerides  Date Value Ref Range Status  05/01/2022 346 (H) 0 - 149 mg/dL Final         Passed - Patient is not pregnant      Passed - Valid encounter within last 12 months    Recent Outpatient Visits           2 months ago Annual physical exam   [facility] [name], [name]   8 months ago Type 2 diabetes mellitus with stage 4 chronic kidney disease, with long-term current use of insulin [facility])   [place] [facility] [name], [name]   11 months ago Type 2 diabetes mellitus with stage 3b chronic kidney disease, without long-term current use of insulin (HCC)   [place] [facility] [name], [name]   1 year ago Depression, recurrent [facility])   [place] [facility] [name], [name]   1 year ago Hypertension associated with diabetes [facility])   [place] [facility] [name], [name]               escitalopram (LEXAPRO) 20 MG tablet 90 tablet 0    Sig: TAKE 1 TABLET BY MOUTH EVERY DAY     Psychiatry:  Antidepressants - SSRI Passed - 07/25/2022 12:18 PM      Passed - Completed PHQ-2 or PHQ-9 in the last 360 days      Passed - Valid encounter within last 6 months    Recent Outpatient Visits           2 months ago Annual physical exam   [facility] [name], [name]   8 months ago Type 2 diabetes mellitus with stage 4 chronic kidney disease, with long-term current use of insulin [facility])   [place] [facility]  [name], [name]   11 months ago Type 2 diabetes mellitus with stage 3b chronic kidney disease, without long-term current use of insulin (HCC)   [place] [facility] [name], [name]   1 year ago Depression, recurrent [facility])   [place] [facility] [name], [name]   1 year ago Hypertension associated with diabetes [facility])   [place] [facility] [name], [name]               glipiZIDE (GLUCOTROL XL) 10 MG 24 hr tablet 90 tablet 0    Sig: TAKE 1 TABLET (10 MG TOTAL) BY MOUTH DAILY WITH BREAKFAST.     Endocrinology:  Diabetes - Sulfonylureas Failed - 07/25/2022 12:18 PM      Failed - Cr in normal range and within 360 days    Creatinine, Ser  Date Value Ref Range Status  05/18/2022 2.22 (H) 0.44 - 1.00 mg/dL Final   Creatinine, Urine  Date Value Ref Range Status  03/09/2022 130 mg/dL Final  Passed - HBA1C is between 0 and 7.9 and within 180 days    Hgb A1c MFr Bld  Date Value Ref Range Status  05/01/2022 6.1 (H) 4.8 - 5.6 % Final    Comment:             Prediabetes: 5.7 - 6.4          Diabetes: >6.4          Glycemic control for adults with diabetes: <7.0          Passed - Valid encounter within last 6 months    Recent Outpatient Visits           2 months ago Annual physical exam   [facility] [name], [name]   8 months ago Type 2 diabetes mellitus with stage 4 chronic kidney disease, with long-term current use of insulin [facility])   [facility] [name], [name]   11 months ago Type 2 diabetes mellitus with stage 3b chronic kidney disease, without long-term current use of insulin [facility])   [place] [facility] [name], [name]   1 year ago Depression, recurrent [facility])   [place] [facility] [name], [name]   1 year ago Hypertension associated with diabetes [facility])   [facility]  [name], [name]

## 2022-07-25 NOTE — Telephone Encounter (Signed)
Requested Prescriptions  Pending Prescriptions Disp Refills   allopurinol (ZYLOPRIM) 100 MG tablet [Pharmacy Med Name: ALLOPURINOL 100 MG TABLET] 90 tablet 1    Sig: TAKE 1 TABLET BY MOUTH EVERY DAY     Endocrinology:  Gout Agents - allopurinol Failed - 07/25/2022 12:18 PM      Failed - Uric Acid in normal range and within 360 days    Uric Acid, Serum  Date Value Ref Range Status  10/29/2015 11.3 (H) 2.3 - 6.6 mg/dL Final         Failed - Cr in normal range and within 360 days    Creatinine, Ser  Date Value Ref Range Status  05/18/2022 2.22 (H) 0.44 - 1.00 mg/dL Final   Creatinine, Urine  Date Value Ref Range Status  03/09/2022 130 mg/dL Final         Passed - Valid encounter within last 12 months    Recent Outpatient Visits           2 months ago Annual physical exam   [facility] [name], [name]   8 months ago Type 2 diabetes mellitus with stage 4 chronic kidney disease, with long-term current use of insulin (HCC)   [facility] [name], [name]   11 months ago Type 2 diabetes mellitus with stage 3b chronic kidney disease, without long-term current use of insulin (HCC)   [name], [name]   1 year ago Depression, recurrent [facility])   [place] [facility] [name], [name]   1 year ago Hypertension associated with diabetes [facility])   [place] [facility] [name], [name]              Passed - CBC within normal limits and completed in the last 12 months    WBC  Date Value Ref Range Status  05/18/2022 7.7 4.0 - 10.5 K/uL Final   RBC  Date Value Ref Range Status  05/18/2022 3.35 (L) 3.87 - 5.11 MIL/uL Final   Hemoglobin  Date Value Ref Range Status  05/18/2022 11.3 (L) 12.0 - 15.0 g/dL Final  16/01/9603 54.0 11.1 - 15.9 g/dL Final   HCT  Date Value Ref Range Status  05/18/2022 34.3 (L) 36.0 - 46.0 % Final   Hematocrit  Date  Value Ref Range Status  05/01/2022 34.3 34.0 - 46.6 % Final   MCHC  Date Value Ref Range Status  05/18/2022 32.9 30.0 - 36.0 g/dL Final   [facility]  Date Value Ref Range Status  05/18/2022 33.7 26.0 - 34.0 pg Final   MCV  Date Value Ref Range Status  05/18/2022 102.4 (H) 80.0 - 100.0 fL Final  05/01/2022 99 (H) 79 - 97 fL Final   No results found for: "PLTCOUNTKUC", "LABPLAT", "POCPLA" RDW  Date Value Ref Range Status  05/18/2022 13.4 11.5 - 15.5 % Final  05/01/2022 13.1 11.7 - 15.4 % Final          Vitamin D, Ergocalciferol, (DRISDOL) 1.25 MG (50000 UNIT) CAPS capsule [Pharmacy Med Name: VITAMIN D2 1.25MG (50,000 UNIT)] 13 capsule 0    Sig: TAKE 1 CAPSULE (50,000 UNITS TOTAL) BY MOUTH EVERY 7 (SEVEN) DAYS. SUNDAY     Endocrinology:  Vitamins - Vitamin D Supplementation 2 Failed - 07/25/2022 12:18 PM      Failed - Manual Review: Route requests for 50,000 IU strength to the provider      Failed -  Vitamin D in normal range and within 360 days    Vit D, 25-Hydroxy  Date Value Ref Range Status  09/28/2020 81.9 30.0 - 100.0 ng/mL Final    Comment:    Vitamin D deficiency has been defined by the Institute of Medicine and an Endocrine Society practice guideline as a level of serum 25-OH vitamin D less than 20 ng/mL (1,2). The Endocrine Society went on to further define vitamin D insufficiency as a level between 21 and 29 ng/mL (2). 1. IOM (Institute of Medicine). 2010. Dietary reference    intakes for calcium and D. [place]: The    [employer]. 2. Holick MF, Binkley [place], Bischoff-Ferrari HA, et al.    Evaluation, treatment, and prevention of vitamin D    deficiency: an Endocrine Society clinical practice    guideline. JCEM. 2011 Jul; 96(7):1911-30.          Passed - Ca in normal range and within 360 days    Calcium  Date Value Ref Range Status  05/18/2022 9.4 8.9 - 10.3 mg/dL Final         Passed - Valid encounter within last 12 months    Recent  Outpatient Visits           2 months ago Annual physical exam   [name], [name]   8 months ago Type 2 diabetes mellitus with stage 4 chronic kidney disease, with long-term current use of insulin (HCC)   [facility] [name], [name]   11 months ago Type 2 diabetes mellitus with stage 3b chronic kidney disease, without long-term current use of insulin (HCC)   [place] [name], [name]   1 year ago Depression, recurrent [facility])   [place] [name], [name]   1 year ago Hypertension associated with diabetes [facility])   [facility] [name], [name]               atorvastatin (LIPITOR) 40 MG tablet [Pharmacy Med Name: ATORVASTATIN 40 MG TABLET] 90 tablet 0    Sig: TAKE 1 TABLET BY MOUTH EVERY DAY     Cardiovascular:  Antilipid - Statins Failed - 07/25/2022 12:18 PM      Failed - Lipid Panel in normal range within the last 12 months    Cholesterol, Total  Date Value Ref Range Status  05/01/2022 208 (H) 100 - 199 mg/dL Final   LDL Chol Calc (NIH)  Date Value Ref Range Status  05/01/2022 111 (H) 0 - 99 mg/dL Final   HDL  Date Value Ref Range Status  05/01/2022 37 (L) >39 mg/dL Final   Triglycerides  Date Value Ref Range Status  05/01/2022 346 (H) 0 - 149 mg/dL Final         Passed - Patient is not pregnant      Passed - Valid encounter within last 12 months    Recent Outpatient Visits           2 months ago Annual physical exam   [facility] [name], [name]   8 months ago Type 2 diabetes mellitus with stage 4 chronic kidney disease, with long-term current use of insulin [facility])   [facility] [name], [name]   11 months ago Type 2 diabetes mellitus with stage 3b chronic kidney disease, without long-term current use of insulin (  [facility])   [place]  [facility] [name], [name]   1 year ago Depression, recurrent [facility])   [place] [facility] [name], [name]   1 year ago Hypertension associated with diabetes [facility])   [facility] [name], [name]               escitalopram (LEXAPRO) 20 MG tablet [Pharmacy Med Name: ESCITALOPRAM 20 MG TABLET] 90 tablet 0    Sig: TAKE 1 TABLET BY MOUTH EVERY DAY     Psychiatry:  Antidepressants - SSRI Passed - 07/25/2022 12:18 PM      Passed - Completed PHQ-2 or PHQ-9 in the last 360 days      Passed - Valid encounter within last 6 months    Recent Outpatient Visits           2 months ago Annual physical exam   [facility] [name], [name]   8 months ago Type 2 diabetes mellitus with stage 4 chronic kidney disease, with long-term current use of insulin (HCC)   [place] [facility] [name], [name]   11 months ago Type 2 diabetes mellitus with stage 3b chronic kidney disease, without long-term current use of insulin (HCC)   [place] [facility] [name], [name]   1 year ago Depression, recurrent [facility])   [place] [facility] [name], [name]   1 year ago Hypertension associated with diabetes [facility])   [place] [name], [name]               traZODone (DESYREL) 50 MG tablet [Pharmacy Med Name: TRAZODONE 50 MG TABLET] 90 tablet 0    Sig: TAKE 1 TABLET BY MOUTH EVERYDAY AT BEDTIME     Psychiatry: Antidepressants - Serotonin Modulator Passed - 07/25/2022 12:18 PM      Passed - Completed PHQ-2 or PHQ-9 in the last 360 days      Passed - Valid encounter within last 6 months    Recent Outpatient Visits           2 months ago Annual physical exam   [facility] [name], [name]   8 months ago Type 2 diabetes mellitus with stage 4 chronic kidney disease, with long-term  current use of insulin [facility])   [place] [facility] [name], [name]   11 months ago Type 2 diabetes mellitus with stage 3b chronic kidney disease, without long-term current use of insulin (HCC)   [place] [name], [name]   1 year ago Depression, recurrent [facility])   [place] [name], [name]   1 year ago Hypertension associated with diabetes [facility])   [place] [facility] [name], [name]               glipiZIDE (GLUCOTROL XL) 10 MG 24 hr tablet [Pharmacy Med Name: GLIPIZIDE ER 10 MG TABLET] 90 tablet 0    Sig: TAKE 1 TABLET (10 MG TOTAL) BY MOUTH DAILY WITH BREAKFAST.     Endocrinology:  Diabetes - Sulfonylureas Failed - 07/25/2022 12:18 PM      Failed - Cr in normal range and within 360 days    Creatinine, Ser  Date Value Ref Range Status  05/18/2022 2.22 (H) 0.44 - 1.00 mg/dL Final   Creatinine, Urine  Date Value Ref Range Status  03/09/2022 130 mg/dL Final         Passed - HBA1C is between 0 and 7.9 and within 180 days    Hgb A1c MFr Bld  Date Value Ref Range Status  05/01/2022 6.1 (H) 4.8 - 5.6 % Final    Comment:             Prediabetes: 5.7 - 6.4          Diabetes: >6.4          Glycemic control for adults with diabetes: <7.0          Passed - Valid encounter within last 6 months    Recent Outpatient Visits           2 months ago Annual physical exam   [facility] [name], [name]   8 months ago Type 2 diabetes mellitus with stage 4 chronic kidney disease, with long-term current use of insulin [facility])   [place] [facility] [name], [name]   11 months ago Type 2 diabetes mellitus with stage 3b chronic kidney disease, without long-term current use of insulin [facility])   [facility] [name], [name]   1 year ago Depression, recurrent [facility])   [place] [facility]  [name], [name]   1 year ago Hypertension associated with diabetes [facility])   [facility] [name], [name]

## 2022-07-25 NOTE — Telephone Encounter (Signed)
Requested medication (s) are due for refill today: For review  Requested medication (s) are on the active medication list: yes    Last refill: Allopurinol  12/11/21  #90  1 refill          Vit D2  07/06/22  #13  0 refills  Future visit scheduled no  Notes to clinic:  Allopurinol failed due to labs   Vit D2 early request, cannot refuse non-delegated meds. Please review.  Requested Prescriptions  Pending Prescriptions Disp Refills   allopurinol (ZYLOPRIM) 100 MG tablet [Pharmacy Med Name: ALLOPURINOL 100 MG TABLET] 90 tablet 1    Sig: TAKE 1 TABLET BY MOUTH EVERY DAY     Endocrinology:  Gout Agents - allopurinol Failed - 07/25/2022 12:18 PM      Failed - Uric Acid in normal range and within 360 days    Uric Acid, Serum  Date Value Ref Range Status  10/29/2015 11.3 (H) 2.3 - 6.6 mg/dL Final         Failed - Cr in normal range and within 360 days    Creatinine, Ser  Date Value Ref Range Status  05/18/2022 2.22 (H) 0.44 - 1.00 mg/dL Final   Creatinine, Urine  Date Value Ref Range Status  03/09/2022 130 mg/dL Final         Passed - Valid encounter within last 12 months    Recent Outpatient Visits           2 months ago Annual physical exam   [place] [facility] [name], [name]   8 months ago Type 2 diabetes mellitus with stage 4 chronic kidney disease, with long-term current use of insulin (HCC)   [place] [facility] [name], [name]   11 months ago Type 2 diabetes mellitus with stage 3b chronic kidney disease, without long-term current use of insulin (HCC)   [place] [name], [name]   1 year ago Depression, recurrent [facility])   [place] [facility] [name], [name]   1 year ago Hypertension associated with diabetes [facility])   [place] [facility] [name], [name]              Passed - CBC within normal limits and completed in the last 12 months    WBC  Date  Value Ref Range Status  05/18/2022 7.7 4.0 - 10.5 K/uL Final   RBC  Date Value Ref Range Status  05/18/2022 3.35 (L) 3.87 - 5.11 MIL/uL Final   Hemoglobin  Date Value Ref Range Status  05/18/2022 11.3 (L) 12.0 - 15.0 g/dL Final  52/84/1324 40.1 11.1 - 15.9 g/dL Final   HCT  Date Value Ref Range Status  05/18/2022 34.3 (L) 36.0 - 46.0 % Final   Hematocrit  Date Value Ref Range Status  05/01/2022 34.3 34.0 - 46.6 % Final   MCHC  Date Value Ref Range Status  05/18/2022 32.9 30.0 - 36.0 g/dL Final   [facility]  Date Value Ref Range Status  05/18/2022 33.7 26.0 - 34.0 pg Final   MCV  Date Value Ref Range Status  05/18/2022 102.4 (H) 80.0 - 100.0 fL Final  05/01/2022 99 (H) 79 - 97 fL Final   No results found for: "PLTCOUNTKUC", "LABPLAT", "POCPLA" RDW  Date Value Ref Range Status  05/18/2022 13.4 11.5 - 15.5 % Final  05/01/2022 13.1 11.7 - 15.4 % Final  Vitamin D, Ergocalciferol, (DRISDOL) 1.25 MG (50000 UNIT) CAPS capsule [Pharmacy Med Name: VITAMIN D2 1.25MG (50,000 UNIT)] 13 capsule 0    Sig: TAKE 1 CAPSULE (50,000 UNITS TOTAL) BY MOUTH EVERY 7 (SEVEN) DAYS. SUNDAY     Endocrinology:  Vitamins - Vitamin D Supplementation 2 Failed - 07/25/2022 12:18 PM      Failed - Manual Review: Route requests for 50,000 IU strength to the provider      Failed - Vitamin D in normal range and within 360 days    Vit D, 25-Hydroxy  Date Value Ref Range Status  09/28/2020 81.9 30.0 - 100.0 ng/mL Final    Comment:    Vitamin D deficiency has been defined by the Institute of Medicine and an Endocrine Society practice guideline as a level of serum 25-OH vitamin D less than 20 ng/mL (1,2). The Endocrine Society went on to further define vitamin D insufficiency as a level between 21 and 29 ng/mL (2). 1. IOM (Institute of Medicine). 2010. Dietary reference    intakes for calcium and D. [place]: The    [employer]. 2. Holick MF, Bink[place], Bischoff-Ferrari HA, et  al.    Evaluation, treatment, and prevention of vitamin D    deficiency: an Endocrine Society clinical practice    guideline. JCEM. 2011 Jul; 96(7):1911-30.          Passed - Ca in normal range and within 360 days    Calcium  Date Value Ref Range Status  05/18/2022 9.4 8.9 - 10.3 mg/dL Final         Passed - Valid encounter within last 12 months    Recent Outpatient Visits           2 months ago Annual physical exam   [facility] [name], [name]   8 months ago Type 2 diabetes mellitus with stage 4 chronic kidney disease, with long-term current use of insulin [facility])   [facility] [name], [name]   11 months ago Type 2 diabetes mellitus with stage 3b chronic kidney disease, without long-term current use of insulin (HCC)   [place] [facility] [name], [name]   1 year ago Depression, recurrent [facility])   [place] [facility] [name], [name]   1 year ago Hypertension associated with diabetes [facility])   [place] [facility] [name], [name]              Signed Prescriptions Disp Refills   atorvastatin (LIPITOR) 40 MG tablet 90 tablet 1    Sig: TAKE 1 TABLET BY MOUTH EVERY DAY     Cardiovascular:  Antilipid - Statins Failed - 07/25/2022 12:18 PM      Failed - Lipid Panel in normal range within the last 12 months    Cholesterol, Total  Date Value Ref Range Status  05/01/2022 208 (H) 100 - 199 mg/dL Final   LDL Chol Calc (NIH)  Date Value Ref Range Status  05/01/2022 111 (H) 0 - 99 mg/dL Final   HDL  Date Value Ref Range Status  05/01/2022 37 (L) >39 mg/dL Final   Triglycerides  Date Value Ref Range Status  05/01/2022 346 (H) 0 - 149 mg/dL Final         Passed - Patient is not pregnant      Passed - Valid encounter within last 12 months    Recent Outpatient Visits  2 months ago Annual physical exam   [place] [name], [name]   8 months ago Type 2 diabetes mellitus with stage 4 chronic kidney disease, with long-term current use of insulin [facility])   [place] [facility] [name], [name]   11 months ago Type 2 diabetes mellitus with stage 3b chronic kidney disease, without long-term current use of insulin [facility])   [place] [facility] [name], [name]   1 year ago Depression, recurrent [facility])   [place] [facility] [name], [name]   1 year ago Hypertension associated with diabetes [facility])   [place] [facility] [name], [name]               escitalopram (LEXAPRO) 20 MG tablet 90 tablet 0    Sig: TAKE 1 TABLET BY MOUTH EVERY DAY     Psychiatry:  Antidepressants - SSRI Passed - 07/25/2022 12:18 PM      Passed - Completed PHQ-2 or PHQ-9 in the last 360 days      Passed - Valid encounter within last 6 months    Recent Outpatient Visits           2 months ago Annual physical exam   [name], [name]   8 months ago Type 2 diabetes mellitus with stage 4 chronic kidney disease, with long-term current use of insulin [facility])   [place] [facility] [name], [name]   11 months ago Type 2 diabetes mellitus with stage 3b chronic kidney disease, without long-term current use of insulin (HCC)   [place] [name], [name]   1 year ago Depression, recurrent [facility])   [place] [facility] [name], [name]   1 year ago Hypertension associated with diabetes [facility])   [place] [facility] [name], [name]               glipiZIDE (GLUCOTROL XL) 10 MG 24 hr tablet 90 tablet 0    Sig: TAKE 1 TABLET (10 MG TOTAL) BY MOUTH DAILY WITH BREAKFAST.     Endocrinology:  Diabetes - Sulfonylureas Failed - 07/25/2022 12:18 PM      Failed - Cr in normal range and within 360 days    Creatinine,  Ser  Date Value Ref Range Status  05/18/2022 2.22 (H) 0.44 - 1.00 mg/dL Final   Creatinine, Urine  Date Value Ref Range Status  03/09/2022 130 mg/dL Final         Passed - HBA1C is between 0 and 7.9 and within 180 days    Hgb A1c MFr Bld  Date Value Ref Range Status  05/01/2022 6.1 (H) 4.8 - 5.6 % Final    Comment:             Prediabetes: 5.7 - 6.4          Diabetes: >6.4          Glycemic control for adults with diabetes: <7.0          Passed - Valid encounter within last 6 months    Recent Outpatient Visits           2 months ago Annual physical exam   [facility] [name], [name]   8 months ago Type 2 diabetes mellitus with stage 4 chronic kidney disease, with long-term  current use of insulin [facility])   [place] [facility] [name], [name]   11 months ago Type 2 diabetes mellitus with stage 3b chronic kidney disease, without long-term current use of insulin [facility])   [place] [facility] [name], [name]   1 year ago Depression, recurrent [facility])   [place] [facility] [name], [name]   1 year ago Hypertension associated with diabetes [facility])   [place] [facility] [name], [name]              Refused Prescriptions Disp Refills   traZODone (DESYREL) 50 MG tablet [Pharmacy Med Name: TRAZODONE 50 MG TABLET] 90 tablet 0    Sig: TAKE 1 TABLET BY MOUTH EVERYDAY AT BEDTIME     Psychiatry: Antidepressants - Serotonin Modulator Passed - 07/25/2022 12:18 PM      Passed - Completed PHQ-2 or PHQ-9 in the last 360 days      Passed - Valid encounter within last 6 months    Recent Outpatient Visits           2 months ago Annual physical exam   [facility] [name], [name]   8 months ago Type 2 diabetes mellitus with stage 4 chronic kidney disease, with long-term current use of insulin [facility])   [place] [facility]  [name], [name]   11 months ago Type 2 diabetes mellitus with stage 3b chronic kidney disease, without long-term current use of insulin [facility]   [place] [facility] [name], [name]   1 year ago Depression, recurrent [facility])   [place] [facility] [name], [name]   1 year ago Hypertension associated with diabetes [facility])   [place] [facility] [name], [name]

## 2022-07-26 ENCOUNTER — Other Ambulatory Visit: Payer: Self-pay

## 2022-07-26 MED ORDER — BLOOD GLUCOSE TEST VI STRP: 1.0000 | ORAL_STRIP | Freq: Three times a day (TID) | 0 refills | Status: DC

## 2022-07-26 MED ORDER — LANCETS MISC. MISC: 1.0000 | Freq: Three times a day (TID) | 0 refills | Status: AC

## 2022-07-26 MED ORDER — BLOOD GLUCOSE MONITORING SUPPL DEVI: 1.0000 | Freq: Three times a day (TID) | 0 refills | Status: AC

## 2022-07-26 MED ORDER — LANCET DEVICE MISC: 1.0000 | Freq: Three times a day (TID) | 0 refills | Status: AC

## 2022-07-31 DIAGNOSIS — H26491 Other secondary cataract, right eye: Secondary | ICD-10-CM | POA: Diagnosis not present

## 2022-08-13 ENCOUNTER — Other Ambulatory Visit: Payer: Self-pay | Admitting: Family Medicine

## 2022-08-13 ENCOUNTER — Telehealth: Payer: Self-pay | Admitting: Family Medicine

## 2022-08-13 DIAGNOSIS — R803 Bence Jones proteinuria: Secondary | ICD-10-CM | POA: Diagnosis not present

## 2022-08-13 DIAGNOSIS — D472 Monoclonal gammopathy: Secondary | ICD-10-CM | POA: Diagnosis not present

## 2022-08-13 DIAGNOSIS — E1122 Type 2 diabetes mellitus with diabetic chronic kidney disease: Secondary | ICD-10-CM | POA: Diagnosis not present

## 2022-08-13 DIAGNOSIS — N184 Chronic kidney disease, stage 4 (severe): Secondary | ICD-10-CM | POA: Diagnosis not present

## 2022-08-15 NOTE — Telephone Encounter (Signed)
Opened in error

## 2022-08-16 DIAGNOSIS — M9905 Segmental and somatic dysfunction of pelvic region: Secondary | ICD-10-CM | POA: Diagnosis not present

## 2022-08-16 DIAGNOSIS — M5136 Other intervertebral disc degeneration, lumbar region: Secondary | ICD-10-CM | POA: Diagnosis not present

## 2022-08-16 DIAGNOSIS — M5416 Radiculopathy, lumbar region: Secondary | ICD-10-CM | POA: Diagnosis not present

## 2022-08-16 DIAGNOSIS — M9903 Segmental and somatic dysfunction of lumbar region: Secondary | ICD-10-CM | POA: Diagnosis not present

## 2022-08-21 ENCOUNTER — Other Ambulatory Visit: Payer: Self-pay | Admitting: Family Medicine

## 2022-08-21 NOTE — Telephone Encounter (Signed)
Requested Prescriptions  Pending Prescriptions Disp Refills   ONETOUCH VERIO test strip [Pharmacy Med Name: ONE TOUCH VERIO TEST STRIP] 100 strip 0    Sig: USE IN THE MORNING, AT NOON, AND AT BEDTIME.     Endocrinology: Diabetes - Testing Supplies Passed - 08/21/2022  2:30 AM      Passed - Valid encounter within last 12 months    Recent Outpatient Visits           3 months ago Annual physical exam   [name], [name]   9 months ago Type 2 diabetes mellitus with stage 4 chronic kidney disease, with long-term current use of insulin [facility])   [place] [facility] [name], [name]   1 year ago Type 2 diabetes mellitus with stage 3b chronic kidney disease, without long-term current use of insulin [facility])   [place] [facility] [name], [name]   1 year ago Depression, recurrent [facility])   [place] [facility] [name], [name]   1 year ago Hypertension associated with diabetes [facility])   [place] [facility] [name], [name]

## 2022-08-23 ENCOUNTER — Ambulatory Visit: Payer: Self-pay | Admitting: *Deleted

## 2022-08-23 ENCOUNTER — Other Ambulatory Visit: Payer: Self-pay

## 2022-08-23 ENCOUNTER — Emergency Department: Payer: No Typology Code available for payment source

## 2022-08-23 ENCOUNTER — Emergency Department
Admission: EM | Admit: 2022-08-23 | Discharge: 2022-08-23 | Disposition: A | Discharge status: Home / Self Care | Payer: No Typology Code available for payment source | Attending: Emergency Medicine | Admitting: Emergency Medicine

## 2022-08-23 ENCOUNTER — Inpatient Hospital Stay: Payer: No Typology Code available for payment source | Attending: Oncology

## 2022-08-23 DIAGNOSIS — I700 Atherosclerosis of aorta: Secondary | ICD-10-CM | POA: Diagnosis not present

## 2022-08-23 DIAGNOSIS — E119 Type 2 diabetes mellitus without complications: Secondary | ICD-10-CM | POA: Insufficient documentation

## 2022-08-23 DIAGNOSIS — R109 Unspecified abdominal pain: Secondary | ICD-10-CM | POA: Diagnosis not present

## 2022-08-23 DIAGNOSIS — C9000 Multiple myeloma not having achieved remission: Secondary | ICD-10-CM | POA: Diagnosis not present

## 2022-08-23 DIAGNOSIS — I10 Essential (primary) hypertension: Secondary | ICD-10-CM | POA: Insufficient documentation

## 2022-08-23 DIAGNOSIS — N184 Chronic kidney disease, stage 4 (severe): Secondary | ICD-10-CM | POA: Insufficient documentation

## 2022-08-23 LAB — URINALYSIS, W/ REFLEX TO CULTURE (INFECTION SUSPECTED)
Bilirubin Urine: NEGATIVE
Glucose, UA: NEGATIVE mg/dL
Hgb urine dipstick: NEGATIVE
Ketones, ur: NEGATIVE mg/dL
Nitrite: NEGATIVE
Protein, ur: NEGATIVE mg/dL
Specific Gravity, Urine: 1.014 (ref 1.005–1.030)
pH: 7 (ref 5.0–8.0)

## 2022-08-23 LAB — CMP (CANCER CENTER ONLY)
ALT: 24 U/L (ref 0–44)
AST: 26 U/L (ref 15–41)
Albumin: 3.7 g/dL (ref 3.5–5.0)
Alkaline Phosphatase: 107 U/L (ref 38–126)
Anion gap: 10 (ref 5–15)
BUN: 33 mg/dL — ABNORMAL HIGH (ref 8–23)
CO2: 24 mmol/L (ref 22–32)
Calcium: 9.1 mg/dL (ref 8.9–10.3)
Chloride: 102 mmol/L (ref 98–111)
Creatinine: 2.72 mg/dL — ABNORMAL HIGH (ref 0.44–1.00)
GFR, Estimated: 17 mL/min — ABNORMAL LOW (ref >60)
Glucose, Bld: 145 mg/dL — ABNORMAL HIGH (ref 70–99)
Potassium: 5.3 mmol/L — ABNORMAL HIGH (ref 3.5–5.1)
Sodium: 136 mmol/L (ref 135–145)
Total Bilirubin: 0.4 mg/dL (ref 0.3–1.2)
Total Protein: 6.8 g/dL (ref 6.5–8.1)

## 2022-08-23 LAB — CBC WITH DIFFERENTIAL (CANCER CENTER ONLY)
Abs Immature Granulocytes: 0.02 10*3/uL (ref 0.00–0.07)
Basophils Absolute: 0.1 10*3/uL (ref 0.0–0.1)
Basophils Relative: 1 %
Eosinophils Absolute: 0.4 10*3/uL (ref 0.0–0.5)
Eosinophils Relative: 4 %
HCT: 35.8 % — ABNORMAL LOW (ref 36.0–46.0)
Hemoglobin: 11.8 g/dL — ABNORMAL LOW (ref 12.0–15.0)
Immature Granulocytes: 0 %
Lymphocytes Relative: 36 %
Lymphs Abs: 3 10*3/uL (ref 0.7–4.0)
MCH: 33.9 pg (ref 26.0–34.0)
MCHC: 33 g/dL (ref 30.0–36.0)
MCV: 102.9 fL — ABNORMAL HIGH (ref 80.0–100.0)
Monocytes Absolute: 0.7 10*3/uL (ref 0.1–1.0)
Monocytes Relative: 8 %
Neutro Abs: 4.3 10*3/uL (ref 1.7–7.7)
Neutrophils Relative %: 51 %
Platelet Count: 209 10*3/uL (ref 150–400)
RBC: 3.48 MIL/uL — ABNORMAL LOW (ref 3.87–5.11)
RDW: 13.1 % (ref 11.5–15.5)
WBC Count: 8.5 10*3/uL (ref 4.0–10.5)
nRBC: 0 % (ref 0.0–0.2)

## 2022-08-23 NOTE — Discharge Instructions (Addendum)
It is unclear what is causing her pain but your testing today did not show evidence of emergency conditions requiring hospitalization or surgeries.  It is important that you follow-up with your doctor this week for recheck.  As we discussed, keep a close eye on your symptoms if you experience any new, worsening, or unexpected symptoms call your doctor right away or return to emergency  department.

## 2022-08-23 NOTE — ED Provider Notes (Signed)
[facility] Provider Note    Event Date/Time   First MD Initiated Contact with Patient 08/23/22 1338     (approximate)   History   Abdominal Pain   HPI  Donna Malone is a 77 y.o. female   Past medical history of diabetes, hypertension, appendectomy last year, cholecystectomy, hysterectomy here with right-sided abdominal pain.  Week of constant pain, unchanged in quality severity or location but comes emergency department today because it has not gone away as she expected to do.  She has had no nausea or vomiting, no change in her bowel movements diarrhea or blood, and no GU symptoms.  She reports no fever or chills, has no chest pain shortness of breath or any other acute medical complaints.   External Medical Documents Reviewed: December 2023 discharge summary when she was admitted for appendicitis and got a lap appendectomy.      Physical Exam   Triage Vital Signs: ED Triage Vitals  Enc Vitals Group     BP 08/23/22 1304 137/79     Pulse Rate 08/23/22 1304 70     Resp 08/23/22 1304 18     Temp 08/23/22 1304 98.2 F (36.8 C)     Temp Source 08/23/22 1304 Oral     SpO2 08/23/22 1304 93 %     Weight 08/23/22 1305 193 lb (87.5 kg)     Height --      Head Circumference --      Peak Flow --      Pain Score 08/23/22 1305 10     Pain Loc --      Pain Edu? --      Excl. in GC? --     Most recent vital signs: Vitals:   08/23/22 1304  BP: 137/79  Pulse: 70  Resp: 18  Temp: 98.2 F (36.8 C)  SpO2: 93%    General: Awake, no distress.  CV:  Good peripheral perfusion.  Resp:  Normal effort.  Abd:  No distention.  Other:  Nondistended abdomen, soft, mild tenderness to the right middle area.  Well-appearing normal vital signs afebrile nontoxic   ED Results / Procedures / Treatments   Labs (all labs ordered are listed, but only abnormal results are displayed) Labs Reviewed  URINALYSIS, W/ REFLEX TO CULTURE (INFECTION SUSPECTED) -  Abnormal; Notable for the following components:      Result Value   Color, Urine YELLOW (*)    APPearance HAZY (*)    Leukocytes,Ua LARGE (*)    Bacteria, UA RARE (*)    All other components within normal limits     I ordered and reviewed the above labs they are notable for rare bacteria but epithelial cells in the urine    RADIOLOGY I independently reviewed and interpreted CT scan of the abdomen pelvis see no obvious obstructive or inflammatory changes   PROCEDURES:  Critical Care performed: No  Procedures   MEDICATIONS ORDERED IN ED: Medications - No data to display  IMPRESSION / MDM / ASSESSMENT AND PLAN / ED COURSE  I reviewed the triage vital signs and the nursing notes.                                Patient's presentation is most consistent with acute presentation with potential threat to life or bodily function.  Differential diagnosis includes, but is not limited to, obstruction, constipation, intra-abdominal infection, urinary tract infection,  pyelonephritis, kidney stone, musculoskeletal pain, aaa   The patient is on the cardiac monitor to evaluate for evidence of arrhythmia and/or significant heart rate changes.  MDM: This patient with right-sided constant abdominal pain for 1 week.  She has had no distention or obstructive symptoms GU symptoms or other infectious symptoms along with her pain.  No obvious inciting event.  Will obtain a CT scan without contrast given her poor kidney function to assess for obstruction or other intra-abdominal surgical pathologies.  Urinalysis to check for urinary tract infection.  Will medicate with GI cocktail.  If the above workup is unremarkable, she will be discharged and have close PMD follow-up. --  Patient remained stable, workup above is unremarkable shows diverticula within the GI tract but no diverticulitis, infection or obstruction.  Urinalysis is contaminated and patient does not have urinary symptoms so I doubt  UTI, defer treatment.  What is causing her pain but given the unremarkable workup and stability in the emergency department I think outpatient follow-up with PMD is appropriate at this time and she knows to return with any new or worsening symptoms.        FINAL CLINICAL IMPRESSION(S) / ED DIAGNOSES   Final diagnoses:  Right lateral abdominal pain     Rx / DC Orders   ED Discharge Orders     None        Note:  This document was prepared using Dragon voice recognition software and may include unintentional dictation errors.    Pilar Jarvis, MD 08/23/22 [phone]

## 2022-08-23 NOTE — ED Triage Notes (Signed)
Pt presents to the ED due to abdominal pain that started about a week ago. Pt states its on the R side but has a hx of appendectomy Jan 13 2022. Pt denies NVD. Pt A&Ox4

## 2022-08-23 NOTE — Telephone Encounter (Signed)
Pt. Reports she went to ED "and they couldn't find anything wrong. They said to call my PCP and see if she wants to send me to a specialist." Please advise pt.

## 2022-08-23 NOTE — Telephone Encounter (Signed)
  Chief Complaint: abdominal pain right side x 1 week Symptoms: right side abdominal pain upper and lower. Constant, sharp pain. Started sudden x 1 week ago and worsening. Hx appendectomy in Oct per patient.  Frequency: 1 week Pertinent Negatives: Patient denies fever, no radiating pain  no N/V no diarrhea.  Disposition: [x] ED /[] Urgent Care (no appt availability in office) / [] Appointment(In office/virtual)/ []  [place] [facility]/ [] Home Care/ [] Refused Recommended Disposition /[] [place] [facility]/ []  Follow-up with PCP Additional Notes:   Recommended ED. Please advise if patient can be seen in office . Moderate constant pain .    Reason for Disposition  Patient sounds very sick or weak to the triager  Answer Assessment - Initial Assessment Questions 1. LOCATION: "Where does it hurt?"      Upper and lower abdominal pain right side  2. RADIATION: "Does the pain shoot anywhere else?" (e.g., chest, back)     no 3. ONSET: "When did the pain begin?" (e.g., minutes, hours or days ago)      1 week  4. SUDDEN: "Gradual or sudden onset?"     Sudden and now constant  5. PATTERN "Does the pain come and go, or is it constant?"    - If it comes and goes: "How long does it last?" "Do you have pain now?"     (Note: Comes and goes means the pain is intermittent. It goes away completely between bouts.)    - If constant: "Is it getting better, staying the same, or getting worse?"      (Note: Constant means the pain never goes away completely; most serious pain is constant and gets worse.)      Constant  6. SEVERITY: "How bad is the pain?"  (e.g., Scale 1-10; mild, moderate, or severe)    - MILD (1-3): Doesn't interfere with normal activities, abdomen soft and not tender to touch.     - MODERATE (4-7): Interferes with normal activities or awakens from sleep, abdomen tender to touch.     - SEVERE (8-10): Excruciating pain, doubled over, unable to do any normal activities.       Bothers at  night but does sleep.  7. RECURRENT SYMPTOM: "Have you ever had this type of stomach pain before?" If Yes, ask: "When was the last time?" and "What happened that time?"      When had emergency surgery appendix in Oct.  8. CAUSE: "What do you think is causing the stomach pain?"     Not sure  9. RELIEVING/AGGRAVATING FACTORS: "What makes it better or worse?" (e.g., antacids, bending or twisting motion, bowel movement)     When lay on back and right side better  10. OTHER SYMPTOMS: "Do you have any other symptoms?" (e.g., back pain, diarrhea, fever, urination pain, vomiting)       Hx diabetes . 11. PREGNANCY: "Is there any chance you are pregnant?" "When was your last menstrual period?"       na  Protocols used: Abdominal Pain - [facility]

## 2022-08-24 DIAGNOSIS — M5136 Other intervertebral disc degeneration, lumbar region: Secondary | ICD-10-CM | POA: Diagnosis not present

## 2022-08-24 DIAGNOSIS — M5416 Radiculopathy, lumbar region: Secondary | ICD-10-CM | POA: Diagnosis not present

## 2022-08-24 DIAGNOSIS — M9905 Segmental and somatic dysfunction of pelvic region: Secondary | ICD-10-CM | POA: Diagnosis not present

## 2022-08-24 DIAGNOSIS — M9903 Segmental and somatic dysfunction of lumbar region: Secondary | ICD-10-CM | POA: Diagnosis not present

## 2022-08-24 LAB — KAPPA/LAMBDA LIGHT CHAINS
Kappa free light chain: 223 mg/L — ABNORMAL HIGH (ref 3.3–19.4)
Kappa, lambda light chain ratio: 6.52 — ABNORMAL HIGH (ref 0.26–1.65)
Lambda free light chains: 34.2 mg/L — ABNORMAL HIGH (ref 5.7–26.3)

## 2022-08-28 ENCOUNTER — Encounter: Payer: Self-pay | Admitting: Physician Assistant

## 2022-08-28 ENCOUNTER — Ambulatory Visit (INDEPENDENT_AMBULATORY_CARE_PROVIDER_SITE_OTHER): Payer: No Typology Code available for payment source | Admitting: Physician Assistant

## 2022-08-28 VITALS — BP 127/76 | HR 69 | Wt 197.6 lb

## 2022-08-28 DIAGNOSIS — R1031 Right lower quadrant pain: Secondary | ICD-10-CM

## 2022-08-28 LAB — POCT URINALYSIS DIPSTICK
Bilirubin, UA: NEGATIVE
Blood, UA: NEGATIVE
Glucose, UA: NEGATIVE
Ketones, UA: NEGATIVE
Nitrite, UA: NEGATIVE
Protein, UA: POSITIVE — AB
Spec Grav, UA: 1.01 (ref 1.010–1.025)
Urobilinogen, UA: 0.2 E.U./dL
pH, UA: 8 (ref 5.0–8.0)

## 2022-08-28 NOTE — Progress Notes (Signed)
I,Sha'taria Tyson,acting as a Neurosurgeon for [employer], PA-C.,have documented all relevant documentation on the behalf of [name], PA-C,as directed by  [name], PA-C while in the presence of [name], PA-C.   Established patient visit   Patient: Donna Malone   DOB: 05-20-1945   77 y.o. Female  MRN: [id number] Visit Date: 08/28/2022  Today's healthcare provider: [name], PA-C   Cc. Abdominal pain x 3-4 weeks  Subjective    Abdominal Pain This is a new problem. The current episode started 1 to 4 weeks ago. The onset quality is sudden. The problem occurs constantly. The pain is located in the RLQ. The pain is severe. The abdominal pain radiates to the back. The pain is aggravated by certain positions (sitting). The pain is relieved by Nothing. She has tried nothing for the symptoms. Kidney Disease   History of appendectomy, cholecystectomy, hysterectomy w/ retained ovaries. Pt has multiple myeloma.  Pt was seen in ED for this pain 08/23/22, CT scan revealed:  IMPRESSION: 1. A specific cause for the patient's right-sided abdominal pain is not identified. 2. Other imaging findings of potential clinical significance: Coronary atherosclerosis. Gastric and duodenal diverticula. Descending and sigmoid colon diverticulosis without active diverticulitis. Pelvic floor laxity with low position of the anorectal junction and urinary bladder below the pubococcygeal line. Lumbar spondylosis and degenerative disc disease with suspected impingement at L4-5 and L5-S1. Small umbilical hernia contains adipose tissue.  Overall pain is improved, sitting is at the worst; Denies worsening with walking.  Medications: Outpatient Medications Prior to Visit  Medication Sig   Accu-Chek Softclix Lancets lancets Use as instructed   acetaminophen (TYLENOL) 500 MG tablet Take 500 mg by mouth every 6 (six) hours as needed for mild pain or headache.   allopurinol (ZYLOPRIM) 100  MG tablet TAKE 1 TABLET BY MOUTH EVERY DAY   atorvastatin (LIPITOR) 40 MG tablet TAKE 1 TABLET BY MOUTH EVERY DAY   B Complex Vitamins (GNP VITAMIN B COMPLEX PO) Take 1 tablet by mouth daily.   blood glucose meter kit and supplies Dispense based on patient and insurance preference. Use once daily for FBG. (FOR ICD-10 E10.9, E11.9).   Blood Glucose Monitoring Suppl DEVI 1 each by Does not apply route in the morning, at noon, and at bedtime. May substitute to any manufacturer covered by patient's insurance.   escitalopram (LEXAPRO) 20 MG tablet TAKE 1 TABLET BY MOUTH EVERY DAY   famotidine (PEPCID) 40 MG tablet TAKE 1 TABLET BY MOUTH EVERY DAY   ferrous sulfate 325 (65 FE) MG EC tablet Take 325 mg by mouth daily.   glipiZIDE (GLUCOTROL XL) 10 MG 24 hr tablet TAKE 1 TABLET (10 MG TOTAL) BY MOUTH DAILY WITH BREAKFAST.   KRILL OIL PO Take by mouth.   levothyroxine (SYNTHROID) 75 MCG tablet Take 1 tablet (75 mcg total) by mouth daily before breakfast.   MAGNESIUM-OXIDE PO Take by mouth.   meclizine (ANTIVERT) 25 MG tablet Take 1-2 tablets (25-50 mg total) by mouth 3 (three) times daily as needed for dizziness.   Multiple Vitamins-Minerals (PRESERVISION AREDS 2) CAPS Take 1 capsule by mouth in the morning and at bedtime.   ONETOUCH VERIO test strip USE IN THE MORNING, AT NOON, AND AT BEDTIME.   Polyethyl Glycol-Propyl Glycol (SYSTANE HYDRATION PF OP) Place 1 drop into both eyes daily as needed (for dry eyes).   traZODone (DESYREL) 50 MG tablet TAKE 1 TABLET BY MOUTH EVERYDAY AT BEDTIME   Vitamin D,  Ergocalciferol, (DRISDOL) 1.25 MG (50000 UNIT) CAPS capsule TAKE 1 CAPSULE (50,000 UNITS TOTAL) BY MOUTH EVERY 7 (SEVEN) DAYS. SUNDAY   buPROPion (WELLBUTRIN XL) 150 MG 24 hr tablet TAKE 1 TABLET BY MOUTH EVERY DAY (Patient not taking: Reported on 08/28/2022)   glucose blood (ACCU-CHEK GUIDE) test strip Use as instructed (Patient not taking: Reported on 08/28/2022)   No facility-administered medications prior  to visit.    Review of Systems  Gastrointestinal:  Positive for abdominal pain.     Objective    BP 127/76 (BP Location: Left Arm, Patient Position: Sitting, Cuff Size: Normal)   Pulse 69   Wt 197 lb 9.6 oz (89.6 kg)   SpO2 97%   BMI 35.00 kg/m   Physical Exam Vitals reviewed.  Constitutional:      Appearance: She is not ill-appearing.  HENT:     Head: Normocephalic.  Eyes:     Conjunctiva/sclera: Conjunctivae normal.  Cardiovascular:     Rate and Rhythm: Normal rate.  Pulmonary:     Effort: Pulmonary effort is normal. No respiratory distress.  Abdominal:     Comments: Focal tenderness RLQ. No skin changes felt. No pain with ambulation.   Neurological:     General: No focal deficit present.     Mental Status: She is alert and oriented to person, place, and time.  Psychiatric:        Mood and Affect: Mood normal.        Behavior: Behavior normal.      No results found for any visits on 08/28/22.  Assessment & Plan     1. Right lower quadrant abdominal pain Will check urine culture Reviewed CT , no abnormality on ovaries, no indication for pelvic US  Advised pt try gas-x otc.   If no improvement with approach from MSK angle and do a Rt hip xray. Advised pt to contact office if no improvement  - POCT Urinalysis Dipstick ; culture ordered  Return if symptoms worsen or fail to improve.      I, [name], PA-C have reviewed all documentation for this visit. The documentation on  08/28/22   for the exam, diagnosis, procedures, and orders are all accurate and complete.  [name], PA-C [facility] [address] [place], [place], [address] Office: [phone] Fax: [phone]   [facility]

## 2022-08-29 LAB — IFE+PROTEIN ELECTRO, 24-HR UR
% BETA, Urine: 59.5 %
ALPHA 1 URINE: 2.6 %
Albumin, U: 26.9 %
Alpha 2, Urine: 5.5 %
GAMMA GLOBULIN URINE: 5.6 %
M-SPIKE %, Urine: 38.4 % — ABNORMAL HIGH
M-Spike, Mg/24 Hr: 106 mg/24 hr — ABNORMAL HIGH
Total Protein, Urine-Ur/day: 277 mg/24 hr — ABNORMAL HIGH (ref 30–150)
Total Protein, Urine: 15.4 mg/dL
Total Volume: 1800

## 2022-08-30 ENCOUNTER — Other Ambulatory Visit: Payer: Self-pay | Admitting: Physician Assistant

## 2022-08-30 DIAGNOSIS — N3000 Acute cystitis without hematuria: Secondary | ICD-10-CM

## 2022-08-30 DIAGNOSIS — M5136 Other intervertebral disc degeneration, lumbar region: Secondary | ICD-10-CM | POA: Diagnosis not present

## 2022-08-30 DIAGNOSIS — M9905 Segmental and somatic dysfunction of pelvic region: Secondary | ICD-10-CM | POA: Diagnosis not present

## 2022-08-30 DIAGNOSIS — M5416 Radiculopathy, lumbar region: Secondary | ICD-10-CM | POA: Diagnosis not present

## 2022-08-30 DIAGNOSIS — M9903 Segmental and somatic dysfunction of lumbar region: Secondary | ICD-10-CM | POA: Diagnosis not present

## 2022-08-30 LAB — URINE CULTURE

## 2022-08-30 LAB — SPECIMEN STATUS REPORT

## 2022-08-30 MED ORDER — CEPHALEXIN 250 MG PO CAPS
250.0000 mg | ORAL_CAPSULE | Freq: Two times a day (BID) | ORAL | 0 refills | Status: AC
Start: 2022-08-30 — End: 2022-09-06

## 2022-09-03 ENCOUNTER — Encounter: Payer: Self-pay | Admitting: Oncology

## 2022-09-03 ENCOUNTER — Inpatient Hospital Stay: Payer: No Typology Code available for payment source | Attending: Oncology | Admitting: Oncology

## 2022-09-03 ENCOUNTER — Telehealth: Payer: Self-pay | Admitting: *Deleted

## 2022-09-03 VITALS — BP 122/61 | HR 64 | Temp 96.5°F | Resp 18 | Wt 199.9 lb

## 2022-09-03 DIAGNOSIS — D72820 Lymphocytosis (symptomatic): Secondary | ICD-10-CM

## 2022-09-03 DIAGNOSIS — Z90710 Acquired absence of both cervix and uterus: Secondary | ICD-10-CM | POA: Diagnosis not present

## 2022-09-03 DIAGNOSIS — C9000 Multiple myeloma not having achieved remission: Secondary | ICD-10-CM | POA: Diagnosis not present

## 2022-09-03 DIAGNOSIS — E1122 Type 2 diabetes mellitus with diabetic chronic kidney disease: Secondary | ICD-10-CM | POA: Diagnosis not present

## 2022-09-03 DIAGNOSIS — I129 Hypertensive chronic kidney disease with stage 1 through stage 4 chronic kidney disease, or unspecified chronic kidney disease: Secondary | ICD-10-CM | POA: Insufficient documentation

## 2022-09-03 DIAGNOSIS — N184 Chronic kidney disease, stage 4 (severe): Secondary | ICD-10-CM | POA: Insufficient documentation

## 2022-09-03 NOTE — Progress Notes (Signed)
Hematology/Oncology Progress note [phone] [phone]      CHIEF COMPLAINTS/REASON FOR VISIT:  Smoldering Multiple myeloma, monoclonal lymphocytosis.   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  M protein on UPEP has decreased.  Patient has no  hypercalcemia, Hb>10, no bone lesions on x-ray skeletal survey as well as PET scan.  Repeat skeletal survey  Patient's kidney function is worse. It is difficult to distinguish if her renal function impairment is due to DM or light chain kidney disease. Kidney biopsy will help to clarify and she declined.  Recommend her to follow up with nephrology. If renal function is progressively worse, can consider empirically try MM treatment to see if that helps to improve renal function,   I recommend continue observation.follow up in 3 months.   Stage 4 chronic kidney disease (HCC) Encourage oral hydration avoid nephrotoxins.   Worse renal function, could be due to UTI/antibiotics, or disease progression.  Continue follow-up with nephrology.  Monoclonal B-cell lymphocytosis of unknown significance Minute clone of B cells.  Continue observation.  Stable and normal total WBC and lymphocyte count. Repeat flowcytometry at next visit.   Orders Placed This Encounter  Procedures   CBC with Differential (Cancer Center Only)    Standing Status:   Future    Standing Expiration Date:   09/03/2023   CMP (Cancer Center only)    Standing Status:   Future    Standing Expiration Date:    09/03/2023   Multiple Myeloma Panel (SPEP&IFE w/QIG)    Standing Status:   Future    Standing Expiration Date:   09/03/2023   Kappa/lambda light chains    Standing Status:   Future    Standing Expiration Date:   09/03/2023   IFE+PROTEIN ELECTRO, 24-HR UR    Standing Status:   Future    Standing Expiration Date:   09/03/2023   Flow cytometry panel-leukemia/lymphoma work-up    Standing Status:   Future    Standing Expiration Date:   09/03/2023   Follow-up in 3 months. All questions were answered. The patient knows to call the clinic with any problems, questions or concerns.  Rickard Patience, MD, PhD [facility] Hematology Oncology 09/03/2022     HISTORY OF PRESENTING ILLNESS:  Donna Malone is a  77 y.o.  female with PMH listed below who was referred to me for evaluation of leukocytosis Reviewed patient' recent labs obtained by PCP.   10/22/2019 CBC showed elevated white count of 11.3, predominantly lymphocytosis.  Normal hemoglobin and platelet count. Previous lab records reviewed. Leukocytosis onset of chronic, duration is since January 2021.   No aggravating or elevated factors. Associated symptoms or signs:  Denies weight loss, fever, chills,night sweats.  Endorses fatigue Smoking history: Never smoker History of recent oral steroid use or steroid injection: No recent steroid injections. History of recent infection: Denies Autoimmune disease history.  Denies  Patient denies any chronic wound, prosthesis.  She has chronic knee arthritis.  #  flow cytometry showed CD5+ , CD23 + B-cell population.  CLL/SLL phenotype.  CD38 negative, <1% leukocytes,<5000 cells/ul # 06/29/2021,  bone marrow biopsy showed hyper cellular marrow with plasma cell neoplasm, 10 to 15% plasma cell infiltrates, predominant.  Cytogenetics is normal.  Multiple myeloma FISH panel normal.  Standard risk.  Patient takes Lasix  for lower extremity swelling. INTERVAL HISTORY Donna Malone is a 77 y.o. female who has  above history reviewed by me today presents for follow up smothering multiple myeloma. + she does not feel well today, mild right lower abdomen pain.  She was diagnosed with UTI and her PCP has prescribed a course antibiotics. She has not felt any improvement.   Review of Systems  Constitutional:  Positive for fatigue. Negative for appetite change, chills and fever.  HENT:   Negative for hearing loss and voice change.   Eyes:  Negative for eye problems.  Respiratory:  Negative for chest tightness and cough.   Cardiovascular:  Negative for chest pain.  Gastrointestinal:  Negative for abdominal distention, abdominal pain and blood in stool.  Endocrine: Negative for hot flashes.  Genitourinary:  Negative for difficulty urinating and frequency.   Musculoskeletal:  Positive for arthralgias.  Skin:  Negative for itching and rash.  Neurological:  Negative for extremity weakness.  Hematological:  Negative for adenopathy.  Psychiatric/Behavioral:  Negative for confusion.      MEDICAL HISTORY:  Past Medical History:  Diagnosis Date   Bone spur    heel   Depression    Diabetes mellitus without complication (HCC)    Gout    Hypertension     SURGICAL HISTORY: Past Surgical History:  Procedure Laterality Date   ABDOMINAL HYSTERECTOMY  04/1970   partial   CHOLECYSTECTOMY  late 1990's   COLONOSCOPY WITH PROPOFOL N/A 10/09/2021   Procedure: COLONOSCOPY WITH PROPOFOL;  Surgeon: Wyline Mood, MD;  Location: [facility] END[facility]COPY;  Service: Gastroenterology;  Laterality: N/A;   LAPAROSCOPIC APPENDECTOMY N/A 03/14/2022   Procedure: APPENDECTOMY LAPAROSCOPIC;  Surgeon: Henrene Dodge, MD;  Location: [facility] ORS;  Service: General;  Laterality: N/A;    SOCIAL HISTORY: Social History   Socioeconomic History   Marital status: Widowed    Spouse name: Danne Harbor   Number of children: 3   Years of education: Not on file   Highest education level: 12th grade  Occupational History   Occupation: retired   Tobacco Use   Smoking status: Never   Smokeless tobacco: Never  Vaping Use   Vaping Use: Never used  Substance and Sexual Activity   Alcohol use: No   Drug use: No   Sexual activity: Not Currently  Other Topics Concern   Not on file  Social History Narrative   Not on file   Social Determinants of Health   Financial Resource Strain: Low Risk  (04/30/2022)   Overall Financial Resource Strain (CARDIA)    Difficulty of Paying Living Expenses: Not hard at all  Food Insecurity: No Food Insecurity (04/30/2022)   Hunger Vital Sign    Worried About Running Out of Food in the Last Year: Never true    Ran Out of Food in the Last Year: Never true  Transportation Needs: No Transportation Needs (04/30/2022)   PRAPARE - Administrator, Civil Service (Medical): No    Lack of Transportation (Non-Medical): No  Physical Activity: Inactive (04/30/2022)   Exercise Vital Sign    Days of Exercise per Week: 0 days    Minutes of Exercise per Session: 0 min  Stress: No Stress Concern Present (04/30/2022)   [employer] of Occupational Health - Occupational Stress Questionnaire  Feeling of Stress : Not at all  Social Connections: Moderately Isolated (04/30/2022)   Social Connection and Isolation Panel [NHANES]    Frequency of Communication with Friends and Family: More than three times a week    Frequency of Social Gatherings with Friends and Family: Twice a week    Attends Religious Services: More than 4 times per year    Active Member of [employer] or Organizations: No    Attends Banker Meetings: Never    Marital Status: Widowed  Intimate Partner Violence: Not At Risk (04/30/2022)   Humiliation, Afraid, Rape, and Kick questionnaire    Fear of Current or Ex-Partner: No    Emotionally Abused: No    Physically Abused: No    Sexually Abused: No    FAMILY HISTORY: Family History  Problem Relation Age of Onset   CAD Mother    Heart attack Mother    Heart disease Father     Diabetes Cousin    Cancer Cousin     ALLERGIES:  is allergic to sulfa antibiotics.  MEDICATIONS:  Current Outpatient Medications  Medication Sig Dispense Refill   Accu-Chek Softclix Lancets lancets Use as instructed 100 each 12   acetaminophen (TYLENOL) 500 MG tablet Take 500 mg by mouth every 6 (six) hours as needed for mild pain or headache.     allopurinol (ZYLOPRIM) 100 MG tablet TAKE 1 TABLET BY MOUTH EVERY DAY 90 tablet 1   atorvastatin (LIPITOR) 40 MG tablet TAKE 1 TABLET BY MOUTH EVERY DAY 90 tablet 1   B Complex Vitamins (GNP VITAMIN B COMPLEX PO) Take 1 tablet by mouth daily.     blood glucose meter kit and supplies Dispense based on patient and insurance preference. Use once daily for FBG. (FOR ICD-10 E10.9, E11.9). 1 each 0   Blood Glucose Monitoring Suppl DEVI 1 each by Does not apply route in the morning, at noon, and at bedtime. May substitute to any manufacturer covered by patient's insurance. 1 each 0   cephALEXin (KEFLEX) 250 MG capsule Take 1 capsule (250 mg total) by mouth 2 (two) times daily for 7 days. 14 capsule 0   escitalopram (LEXAPRO) 20 MG tablet TAKE 1 TABLET BY MOUTH EVERY DAY 90 tablet 0   famotidine (PEPCID) 40 MG tablet TAKE 1 TABLET BY MOUTH EVERY DAY 90 tablet 3   ferrous sulfate 325 (65 FE) MG EC tablet Take 325 mg by mouth daily.     glipiZIDE (GLUCOTROL XL) 10 MG 24 hr tablet TAKE 1 TABLET (10 MG TOTAL) BY MOUTH DAILY WITH BREAKFAST. 90 tablet 0   KRILL OIL PO Take by mouth.     levothyroxine (SYNTHROID) 75 MCG tablet Take 1 tablet (75 mcg total) by mouth daily before breakfast. 90 tablet 1   MAGNESIUM-OXIDE PO Take by mouth.     meclizine (ANTIVERT) 25 MG tablet Take 1-2 tablets (25-50 mg total) by mouth 3 (three) times daily as needed for dizziness. 30 tablet 0   Multiple Vitamins-Minerals (PRESERVISION AREDS 2) CAPS Take 1 capsule by mouth in the morning and at bedtime.     ONETOUCH VERIO test strip USE IN THE MORNING, AT NOON, AND AT BEDTIME. 100  strip 0   Polyethyl Glycol-Propyl Glycol (SYSTANE HYDRATION PF OP) Place 1 drop into both eyes daily as needed (for dry eyes).     traZODone (DESYREL) 50 MG tablet TAKE 1 TABLET BY MOUTH EVERYDAY AT BEDTIME 90 tablet 0   Vitamin D, Ergocalciferol, (DRISDOL) 1.25 MG (50000  UNIT) CAPS capsule TAKE 1 CAPSULE (50,000 UNITS TOTAL) BY MOUTH EVERY 7 (SEVEN) DAYS. SUNDAY 13 capsule 0   glucose blood (ACCU-CHEK GUIDE) test strip Use as instructed (Patient not taking: Reported on 08/28/2022) 100 each 12   No current facility-administered medications for this visit.     PHYSICAL EXAMINATION: ECOG PERFORMANCE STATUS: 1 - Symptomatic but completely ambulatory Vitals:   09/03/22 1017  BP: 122/61  Pulse: 64  Resp: 18  Temp: (!) 96.5 F (35.8 C)   Filed Weights   09/03/22 1017  Weight: 199 lb 14.4 oz (90.7 kg)    Physical Exam Constitutional:      General: She is not in acute distress.    Comments: Patient walks independently  HENT:     Head: Normocephalic and atraumatic.  Eyes:     General: No scleral icterus. Cardiovascular:     Rate and Rhythm: Normal rate and regular rhythm.     Heart sounds: Normal heart sounds.  Pulmonary:     Effort: Pulmonary effort is normal. No respiratory distress.     Breath sounds: No wheezing.  Abdominal:     General: Bowel sounds are normal. There is no distension.     Palpations: Abdomen is soft.  Musculoskeletal:        General: No deformity. Normal range of motion.     Cervical back: Normal range of motion and neck supple.  Skin:    General: Skin is warm and dry.     Findings: No erythema or rash.  Neurological:     Mental Status: She is alert and oriented to person, place, and time. Mental status is at baseline.     Cranial Nerves: No cranial nerve deficit.     Coordination: Coordination normal.  Psychiatric:        Mood and Affect: Mood normal.        Latest Ref Rng & Units 08/23/2022   10:57 AM  CMP  Glucose 70 - 99 mg/dL 621   BUN 8 -  23 mg/dL 33   Creatinine 3.08 - 1.00 mg/dL 6.57   Sodium 846 - 962 mmol/L 136   Potassium 3.5 - 5.1 mmol/L 5.3   Chloride 98 - 111 mmol/L 102   CO2 22 - 32 mmol/L 24   Calcium 8.9 - 10.3 mg/dL 9.1   Total Protein 6.5 - 8.1 g/dL 6.8   Total Bilirubin 0.3 - 1.2 mg/dL 0.4   Alkaline Phos 38 - 126 U/L 107   AST 15 - 41 U/L 26   ALT 0 - 44 U/L 24       Latest Ref Rng & Units 08/23/2022   10:58 AM  CBC  WBC 4.0 - 10.5 K/uL 8.5   Hemoglobin 12.0 - 15.0 g/dL 95.2   Hematocrit 84.1 - 46.0 % 35.8   Platelets 150 - 400 K/uL 209      RADIOGRAPHIC STUDIES: I have personally reviewed the radiological images as listed and agreed with the findings in the report. CT ABDOMEN PELVIS WO CONTRAST  Result Date: 08/23/2022 CLINICAL DATA:  Right-sided abdominal pain starting proximally 1 week ago. EXAM: CT ABDOMEN AND PELVIS WITHOUT CONTRAST TECHNIQUE: Multidetector CT imaging of the abdomen and pelvis was performed following the standard protocol without IV contrast. RADIATION DOSE REDUCTION: This exam was performed according to the departmental dose-optimization program which includes automated exposure control, adjustment of the mA and/or kV according to patient size and/or use of iterative reconstruction technique. COMPARISON:  03/14/2022 FINDINGS: Lower chest: Descending  thoracic aortic and right coronary artery atherosclerotic vascular calcification. Hepatobiliary: Cholecystectomy.  Otherwise unremarkable. Pancreas: Unremarkable Spleen: Unremarkable Adrenals/Urinary Tract: Benign peripelvic cysts, left greater than right. No further imaging workup of these lesions is indicated. Adrenal glands unremarkable. The urinary bladder extends 6 mm below the pubococcygeal line, compatible with pelvic floor laxity. Stomach/Bowel: Gastric diverticulum shown on image 19 series 2. Periampullary duodenal diverticulum without signs of inflammation. Prior appendectomy. Descending and sigmoid colon diverticulosis without  findings of active diverticulitis. Low position of the anorectal junction compatible with pelvic floor laxity. Vascular/Lymphatic: Atherosclerosis is present, including aortoiliac atherosclerotic disease. Reproductive: Uterus absent.  Adnexa unremarkable. Other: No supplemental non-categorized findings. Musculoskeletal: Small umbilical hernia contains adipose tissue. Lumbar spondylosis and degenerative disc disease with suspected impingement at L4-5 and L5-S1. IMPRESSION: 1. A specific cause for the patient's right-sided abdominal pain is not identified. 2. Other imaging findings of potential clinical significance: Coronary atherosclerosis. Gastric and duodenal diverticula. Descending and sigmoid colon diverticulosis without active diverticulitis. Pelvic floor laxity with low position of the anorectal junction and urinary bladder below the pubococcygeal line. Lumbar spondylosis and degenerative disc disease with suspected impingement at L4-5 and L5-S1. Small umbilical hernia contains adipose tissue. Aortic Atherosclerosis (ICD10-I70.0). Electronically Signed   By: Gaylyn Rong M.D.   On: 08/23/2022 14:15    LABORATORY DATA:  I have reviewed the data as listed Lab Results  Component Value Date   WBC 8.5 08/23/2022   HGB 11.8 (L) 08/23/2022   HCT 35.8 (L) 08/23/2022   MCV 102.9 (H) 08/23/2022   PLT 209 08/23/2022   Recent Labs    03/14/22 0253 05/01/22 1355 05/18/22 1126 08/23/22 1057  NA 138 139 134* 136  K 4.4 5.3* 5.1 5.3*  CL 112* 106 104 102  CO2 19* 17* 21* 24  GLUCOSE 157* 128* 84 145*  BUN 36* 32* 31* 33*  CREATININE 2.36* 2.16* 2.22* 2.72*  CALCIUM 9.7 9.6 9.4 9.1  GFRNONAA 21*  --  22* 17*  PROT 7.1 6.1 6.6 6.8  ALBUMIN 3.9 3.8 3.7 3.7  AST 23 17 20 26   ALT 19 12 15 24   ALKPHOS 100 115 97 107  BILITOT 0.8 0.3 0.7 0.4    Iron/TIBC/Ferritin/ %Sat    Component Value Date/Time   IRON 56 04/23/2017 1644   TIBC 362 04/23/2017 1644   FERRITIN 19 04/23/2017 1644    IRONPCTSAT 15 04/23/2017 1644

## 2022-09-03 NOTE — Progress Notes (Signed)
Pt here for follow up. Reports pain to right lower abdomina area, that has been going on for a couple of weeks.

## 2022-09-03 NOTE — Assessment & Plan Note (Addendum)
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  M protein on UPEP has decreased.  Patient has no  hypercalcemia, Hb>10, no bone lesions on x-ray skeletal survey as well as PET scan.  Repeat skeletal survey  Patient's kidney function is worse. It is difficult to distinguish if her renal function impairment is due to DM or light chain kidney disease. Kidney biopsy will help to clarify and she declined.  Recommend her to follow up with nephrology. If renal function is progressively worse, can consider empirically try MM treatment to see if that helps to improve renal function,   I recommend continue observation.follow up in 3 months.

## 2022-09-03 NOTE — Telephone Encounter (Signed)
Pt given lab results per notes of L.Drubel, PA from 08/30/22 on 09/03/22. Pt verbalized understanding and has started keflex but concerned because she has not received a call regarding positive for bladder infection.

## 2022-09-03 NOTE — Assessment & Plan Note (Addendum)
Encourage oral hydration avoid nephrotoxins.   Worse renal function, could be due to UTI/antibiotics, or disease progression.  Continue follow-up with nephrology.

## 2022-09-03 NOTE — Assessment & Plan Note (Addendum)
Minute clone of B cells.  Continue observation.  Stable and normal total WBC and lymphocyte count. Repeat flowcytometry at next visit.

## 2022-09-04 DIAGNOSIS — M9905 Segmental and somatic dysfunction of pelvic region: Secondary | ICD-10-CM | POA: Diagnosis not present

## 2022-09-04 DIAGNOSIS — M5416 Radiculopathy, lumbar region: Secondary | ICD-10-CM | POA: Diagnosis not present

## 2022-09-04 DIAGNOSIS — M5136 Other intervertebral disc degeneration, lumbar region: Secondary | ICD-10-CM | POA: Diagnosis not present

## 2022-09-04 DIAGNOSIS — M9903 Segmental and somatic dysfunction of lumbar region: Secondary | ICD-10-CM | POA: Diagnosis not present

## 2022-09-04 LAB — MULTIPLE MYELOMA PANEL, SERUM
Albumin SerPl Elph-Mcnc: 3.6 g/dL (ref 2.9–4.4)
Albumin/Glob SerPl: 1.3 (ref 0.7–1.7)
Alpha 1: 0.2 g/dL (ref 0.0–0.4)
Alpha2 Glob SerPl Elph-Mcnc: 1.3 g/dL — ABNORMAL HIGH (ref 0.4–1.0)
B-Globulin SerPl Elph-Mcnc: 0.9 g/dL (ref 0.7–1.3)
Gamma Glob SerPl Elph-Mcnc: 0.6 g/dL (ref 0.4–1.8)
Globulin, Total: 2.9 g/dL (ref 2.2–3.9)
IgA: 677 mg/dL — ABNORMAL HIGH (ref 64–422)
IgG (Immunoglobin G), Serum: 562 mg/dL — ABNORMAL LOW (ref 586–1602)
IgM (Immunoglobulin M), Srm: 137 mg/dL (ref 26–217)
M Protein SerPl Elph-Mcnc: 0.6 g/dL — ABNORMAL HIGH
Total Protein ELP: 6.5 g/dL (ref 6.0–8.5)

## 2022-09-05 NOTE — Telephone Encounter (Signed)
Left detailed message on vm to call back to schedule an appt.

## 2022-09-05 NOTE — Telephone Encounter (Signed)
Patient called on both numbers listed, left VM to return the call to the office. 

## 2022-09-06 ENCOUNTER — Ambulatory Visit: Payer: No Typology Code available for payment source | Admitting: Physician Assistant

## 2022-09-06 ENCOUNTER — Encounter: Payer: Self-pay | Admitting: Physician Assistant

## 2022-09-06 VITALS — BP 134/72 | HR 64 | Temp 98.3°F | Resp 12 | Wt 197.0 lb

## 2022-09-06 DIAGNOSIS — R1031 Right lower quadrant pain: Secondary | ICD-10-CM | POA: Diagnosis not present

## 2022-09-06 DIAGNOSIS — M9905 Segmental and somatic dysfunction of pelvic region: Secondary | ICD-10-CM | POA: Diagnosis not present

## 2022-09-06 DIAGNOSIS — M9903 Segmental and somatic dysfunction of lumbar region: Secondary | ICD-10-CM | POA: Diagnosis not present

## 2022-09-06 DIAGNOSIS — M5416 Radiculopathy, lumbar region: Secondary | ICD-10-CM | POA: Diagnosis not present

## 2022-09-06 DIAGNOSIS — R8271 Bacteriuria: Secondary | ICD-10-CM | POA: Diagnosis not present

## 2022-09-06 DIAGNOSIS — M5136 Other intervertebral disc degeneration, lumbar region: Secondary | ICD-10-CM | POA: Diagnosis not present

## 2022-09-06 LAB — POCT URINALYSIS DIPSTICK
Bilirubin, UA: NEGATIVE
Blood, UA: NEGATIVE
Glucose, UA: NEGATIVE
Ketones, UA: NEGATIVE
Nitrite, UA: NEGATIVE
Protein, UA: NEGATIVE
Spec Grav, UA: 1.01 (ref 1.010–1.025)
Urobilinogen, UA: 0.2 E.U./dL
pH, UA: 7 (ref 5.0–8.0)

## 2022-09-06 NOTE — Progress Notes (Incomplete)
I,Sulibeya S Dimas,acting as a Neurosurgeon for [employer], PA-C.,have documented all relevant documentation on the behalf of [name], PA-C,as directed by  [name], PA-C while in the presence of [name], PA-C.     Established patient visit   Patient: Donna Malone   DOB: 1945/04/17   77 y.o. Female  MRN: [id number] Visit Date: 09/06/2022  Today's healthcare provider: [name], PA-C   Chief Complaint  Patient presents with  . Abdominal Pain   Subjective    HPI  Follow up for abdominal pain Rlq pain no radiation, radiates to back; some groin pain sep when moving rit leg The patient was last seen for this 1 weeks ago. Changes made at last visit include start Keflex.  She reports excellent compliance with treatment. She feels that condition is Worse. She reports pain only when moving form sitting to standing.  She denies any painful urination or burning.  She is not having side effects.   ----------------------------------------------------------------------------------------- Medications: Outpatient Medications Prior to Visit  Medication Sig  . Accu-Chek Softclix Lancets lancets Use as instructed  . acetaminophen (TYLENOL) 500 MG tablet Take 500 mg by mouth every 6 (six) hours as needed for mild pain or headache.  . allopurinol (ZYLOPRIM) 100 MG tablet TAKE 1 TABLET BY MOUTH EVERY DAY  . atorvastatin (LIPITOR) 40 MG tablet TAKE 1 TABLET BY MOUTH EVERY DAY  . B Complex Vitamins (GNP VITAMIN B COMPLEX PO) Take 1 tablet by mouth daily.  . blood glucose meter kit and supplies Dispense based on patient and insurance preference. Use once daily for FBG. (FOR ICD-10 E10.9, E11.9).  Marland Kitchen Blood Glucose Monitoring Suppl DEVI 1 each by Does not apply route in the morning, at noon, and at bedtime. May substitute to any manufacturer covered by patient's insurance.  . cephALEXin (KEFLEX) 250 MG capsule Take 1 capsule (250 mg total) by mouth 2 (two) times daily for 7  days.  Marland Kitchen escitalopram (LEXAPRO) 20 MG tablet TAKE 1 TABLET BY MOUTH EVERY DAY  . famotidine (PEPCID) 40 MG tablet TAKE 1 TABLET BY MOUTH EVERY DAY  . ferrous sulfate 325 (65 FE) MG EC tablet Take 325 mg by mouth daily.  Marland Kitchen glipiZIDE (GLUCOTROL XL) 10 MG 24 hr tablet TAKE 1 TABLET (10 MG TOTAL) BY MOUTH DAILY WITH BREAKFAST.  Marland Kitchen glucose blood (ACCU-CHEK GUIDE) test strip Use as instructed  . KRILL OIL PO Take by mouth.  . levothyroxine (SYNTHROID) 75 MCG tablet Take 1 tablet (75 mcg total) by mouth daily before breakfast.  . MAGNESIUM-OXIDE PO Take by mouth.  . meclizine (ANTIVERT) 25 MG tablet Take 1-2 tablets (25-50 mg total) by mouth 3 (three) times daily as needed for dizziness.  . Multiple Vitamins-Minerals (PRESERVISION AREDS 2) CAPS Take 1 capsule by mouth in the morning and at bedtime.  Letta Pate VERIO test strip USE IN THE MORNING, AT NOON, AND AT BEDTIME.  Bertram Gala Glycol-Propyl Glycol (SYSTANE HYDRATION PF OP) Place 1 drop into both eyes daily as needed (for dry eyes).  . traZODone (DESYREL) 50 MG tablet TAKE 1 TABLET BY MOUTH EVERYDAY AT BEDTIME  . Vitamin D, Ergocalciferol, (DRISDOL) 1.25 MG (50000 UNIT) CAPS capsule TAKE 1 CAPSULE (50,000 UNITS TOTAL) BY MOUTH EVERY 7 (SEVEN) DAYS. SUNDAY   No facility-administered medications prior to visit.    Review of Systems  Constitutional:  Negative for chills and fatigue.  Gastrointestinal:  Positive for abdominal pain. Negative for nausea and vomiting.  Musculoskeletal:  Positive for back  pain and myalgias.    {Labs  Heme  Chem  Endocrine  Serology  Results Review (optional):23779}   Objective    BP 134/72 (BP Location: Left Arm, Patient Position: Sitting, Cuff Size: Large)   Pulse 64   Temp 98.3 F (36.8 C) (Temporal)   Resp 12   Wt 197 lb (89.4 kg)   SpO2 100%   BMI 34.90 kg/m  BP Readings from Last 3 Encounters:  09/06/22 134/72  09/03/22 122/61  08/28/22 127/76   Wt Readings from Last 3 Encounters:  09/06/22  197 lb (89.4 kg)  09/03/22 199 lb 14.4 oz (90.7 kg)  08/28/22 197 lb 9.6 oz (89.6 kg)      Physical Exam  ***  No results found for any visits on 09/06/22.  Assessment & Plan     ***  No follow-ups on file.      {provider attestation***:1}   [name], PA-C  [facility] [phone] (phone) [phone] (fax)  [facility]

## 2022-09-06 NOTE — Patient Instructions (Signed)
Please contact us at [facility] at [facility]: [phone] to schedule this visit.

## 2022-09-06 NOTE — Progress Notes (Signed)
I,Sulibeya S Dimas,acting as a Neurosurgeon for [employer], PA-C.,have documented all relevant documentation on the behalf of [name], PA-C,as directed by  [name], PA-C while in the presence of [name], PA-C.     Established patient visit   Patient: Donna Malone   DOB: Feb 21, 1946   77 y.o. Female  MRN: [id number] Visit Date: 09/06/2022  Today's healthcare provider: [name], PA-C   Chief Complaint  Patient presents with   Abdominal Pain   Subjective    HPI  Follow up for abdominal pain  The patient was last seen for this 1 weeks ago. Changes made at last visit include start Keflex.  She reports excellent compliance with treatment. She feels that condition is Worse. She reports pain only when moving form sitting to standing.  She denies any painful urination or burning.  She is not having side effects.   Pt reports her RLQ abdominal pain did not improved with UTI therapy and is persistent. Radiates to RUQ and to back some. Denies radiation to groin.   When moving right hip some separate groin pain.  ----------------------------------------------------------------------------------------- Medications: Outpatient Medications Prior to Visit  Medication Sig   Accu-Chek Softclix Lancets lancets Use as instructed   acetaminophen (TYLENOL) 500 MG tablet Take 500 mg by mouth every 6 (six) hours as needed for mild pain or headache.   allopurinol (ZYLOPRIM) 100 MG tablet TAKE 1 TABLET BY MOUTH EVERY DAY   atorvastatin (LIPITOR) 40 MG tablet TAKE 1 TABLET BY MOUTH EVERY DAY   B Complex Vitamins (GNP VITAMIN B COMPLEX PO) Take 1 tablet by mouth daily.   blood glucose meter kit and supplies Dispense based on patient and insurance preference. Use once daily for FBG. (FOR ICD-10 E10.9, E11.9).   Blood Glucose Monitoring Suppl DEVI 1 each by Does not apply route in the morning, at noon, and at bedtime. May substitute to any manufacturer covered by patient's  insurance.   cephALEXin (KEFLEX) 250 MG capsule Take 1 capsule (250 mg total) by mouth 2 (two) times daily for 7 days.   escitalopram (LEXAPRO) 20 MG tablet TAKE 1 TABLET BY MOUTH EVERY DAY   famotidine (PEPCID) 40 MG tablet TAKE 1 TABLET BY MOUTH EVERY DAY   ferrous sulfate 325 (65 FE) MG EC tablet Take 325 mg by mouth daily.   glipiZIDE (GLUCOTROL XL) 10 MG 24 hr tablet TAKE 1 TABLET (10 MG TOTAL) BY MOUTH DAILY WITH BREAKFAST.   glucose blood (ACCU-CHEK GUIDE) test strip Use as instructed   KRILL OIL PO Take by mouth.   levothyroxine (SYNTHROID) 75 MCG tablet Take 1 tablet (75 mcg total) by mouth daily before breakfast.   MAGNESIUM-OXIDE PO Take by mouth.   meclizine (ANTIVERT) 25 MG tablet Take 1-2 tablets (25-50 mg total) by mouth 3 (three) times daily as needed for dizziness.   Multiple Vitamins-Minerals (PRESERVISION AREDS 2) CAPS Take 1 capsule by mouth in the morning and at bedtime.   ONETOUCH VERIO test strip USE IN THE MORNING, AT NOON, AND AT BEDTIME.   Polyethyl Glycol-Propyl Glycol (SYSTANE HYDRATION PF OP) Place 1 drop into both eyes daily as needed (for dry eyes).   traZODone (DESYREL) 50 MG tablet TAKE 1 TABLET BY MOUTH EVERYDAY AT BEDTIME   Vitamin D, Ergocalciferol, (DRISDOL) 1.25 MG (50000 UNIT) CAPS capsule TAKE 1 CAPSULE (50,000 UNITS TOTAL) BY MOUTH EVERY 7 (SEVEN) DAYS. SUNDAY   No facility-administered medications prior to visit.    Review of Systems  Constitutional:  Negative for chills and fatigue.  Gastrointestinal:  Positive for abdominal pain. Negative for nausea and vomiting.  Musculoskeletal:  Positive for back pain and myalgias.      Objective    BP 134/72 (BP Location: Left Arm, Patient Position: Sitting, Cuff Size: Large)   Pulse 64   Temp 98.3 F (36.8 C) (Temporal)   Resp 12   Wt 197 lb (89.4 kg)   SpO2 100%   BMI 34.90 kg/m  BP Readings from Last 3 Encounters:  09/06/22 134/72  09/03/22 122/61  08/28/22 127/76   Wt Readings from Last 3  Encounters:  09/06/22 197 lb (89.4 kg)  09/03/22 199 lb 14.4 oz (90.7 kg)  08/28/22 197 lb 9.6 oz (89.6 kg)      Physical Exam Constitutional:      Appearance: She is well-developed.  Cardiovascular:     Rate and Rhythm: Normal rate and regular rhythm.  Pulmonary:     Effort: Pulmonary effort is normal.     Breath sounds: Normal breath sounds.  Abdominal:     General: Abdomen is protuberant. There is no distension.     Palpations: Abdomen is soft.     Tenderness: There is abdominal tenderness in the right lower quadrant. There is no guarding or rebound.  Neurological:     Mental Status: She is alert.      Results for orders placed or performed in visit on 09/06/22  POCT urinalysis dipstick  Result Value Ref Range   Color, UA yellow    Clarity, UA clear    Glucose, UA Negative Negative   Bilirubin, UA Negative    Ketones, UA Negative    Spec Grav, UA 1.010 1.010 - 1.025   Blood, UA Negative    pH, UA 7.0 5.0 - 8.0   Protein, UA Negative Negative   Urobilinogen, UA 0.2 0.2 or 1.0 E.U./dL   Nitrite, UA Negative    Leukocytes, UA Moderate (2+) (A) Negative    Assessment & Plan     1. Right lower quadrant abdominal pain 2. Bacteria in urine Reviewed CT again, nothing indicative of pain  Will repeat UA and culture Will do US abdomen   Referring to GI  - POCT urinalysis dipstick - US Abdomen Complete; Future - Ambulatory referral to Gastroenterology - Urine Culture   No follow-ups on file.      I, [name], PA-C have reviewed all documentation for this visit. The documentation on  09/07/22   for the exam, diagnosis, procedures, and orders are all accurate and complete.  [name], PA-C [facility] [address] [place], [place], [address] Office: [phone] Fax: [phone]   [facility]

## 2022-09-06 NOTE — Progress Notes (Deleted)
I,Sulibeya S Dimas,acting as a Neurosurgeon for [employer], PA-C.,have documented all relevant documentation on the behalf of [name], PA-C,as directed by  [name], PA-C while in the presence of [name], PA-C.     Established patient visit   Patient: Donna Malone   DOB: 01/01/1946   77 y.o. Female  MRN: [id number] Visit Date: 09/06/2022  Today's healthcare provider: [name], PA-C   Chief Complaint  Patient presents with   Abdominal Pain   Subjective    HPI  Follow up for abdominal pain  The patient was last seen for this 1 weeks ago. Changes made at last visit include start Keflex.  She reports excellent compliance with treatment. She feels that condition is Worse. She reports pain only when moving form sitting to standing.  She denies any painful urination or burning.  She is not having side effects.   -----------------------------------------------------------------------------------------   Medications: Outpatient Medications Prior to Visit  Medication Sig   Accu-Chek Softclix Lancets lancets Use as instructed   acetaminophen (TYLENOL) 500 MG tablet Take 500 mg by mouth every 6 (six) hours as needed for mild pain or headache.   allopurinol (ZYLOPRIM) 100 MG tablet TAKE 1 TABLET BY MOUTH EVERY DAY   atorvastatin (LIPITOR) 40 MG tablet TAKE 1 TABLET BY MOUTH EVERY DAY   B Complex Vitamins (GNP VITAMIN B COMPLEX PO) Take 1 tablet by mouth daily.   blood glucose meter kit and supplies Dispense based on patient and insurance preference. Use once daily for FBG. (FOR ICD-10 E10.9, E11.9).   Blood Glucose Monitoring Suppl DEVI 1 each by Does not apply route in the morning, at noon, and at bedtime. May substitute to any manufacturer covered by patient's insurance.   cephALEXin (KEFLEX) 250 MG capsule Take 1 capsule (250 mg total) by mouth 2 (two) times daily for 7 days.   escitalopram (LEXAPRO) 20 MG tablet TAKE 1 TABLET BY MOUTH EVERY DAY    famotidine (PEPCID) 40 MG tablet TAKE 1 TABLET BY MOUTH EVERY DAY   ferrous sulfate 325 (65 FE) MG EC tablet Take 325 mg by mouth daily.   glipiZIDE (GLUCOTROL XL) 10 MG 24 hr tablet TAKE 1 TABLET (10 MG TOTAL) BY MOUTH DAILY WITH BREAKFAST.   glucose blood (ACCU-CHEK GUIDE) test strip Use as instructed   KRILL OIL PO Take by mouth.   levothyroxine (SYNTHROID) 75 MCG tablet Take 1 tablet (75 mcg total) by mouth daily before breakfast.   MAGNESIUM-OXIDE PO Take by mouth.   meclizine (ANTIVERT) 25 MG tablet Take 1-2 tablets (25-50 mg total) by mouth 3 (three) times daily as needed for dizziness.   Multiple Vitamins-Minerals (PRESERVISION AREDS 2) CAPS Take 1 capsule by mouth in the morning and at bedtime.   ONETOUCH VERIO test strip USE IN THE MORNING, AT NOON, AND AT BEDTIME.   Polyethyl Glycol-Propyl Glycol (SYSTANE HYDRATION PF OP) Place 1 drop into both eyes daily as needed (for dry eyes).   traZODone (DESYREL) 50 MG tablet TAKE 1 TABLET BY MOUTH EVERYDAY AT BEDTIME   Vitamin D, Ergocalciferol, (DRISDOL) 1.25 MG (50000 UNIT) CAPS capsule TAKE 1 CAPSULE (50,000 UNITS TOTAL) BY MOUTH EVERY 7 (SEVEN) DAYS. SUNDAY   No facility-administered medications prior to visit.    Review of Systems  Constitutional:  Negative for chills and fatigue.  Gastrointestinal:  Positive for abdominal pain. Negative for nausea and vomiting.  Musculoskeletal:  Positive for back pain and myalgias.    {Labs  Heme  Chem  Endocrine  Serology  Results Review (optional):23779}   Objective    BP 134/72 (BP Location: Left Arm, Patient Position: Sitting, Cuff Size: Large)   Pulse 64   Temp 98.3 F (36.8 C) (Temporal)   Resp 12   Wt 197 lb (89.4 kg)   SpO2 100%   BMI 34.90 kg/m  BP Readings from Last 3 Encounters:  09/06/22 134/72  09/03/22 122/61  08/28/22 127/76   Wt Readings from Last 3 Encounters:  09/06/22 197 lb (89.4 kg)  09/03/22 199 lb 14.4 oz (90.7 kg)  08/28/22 197 lb 9.6 oz (89.6 kg)       Physical Exam  ***  No results found for any visits on 09/06/22.  Assessment & Plan     ***  No follow-ups on file.      {provider attestation***:1}   [name], PA-C  [facility] [phone] (phone) [phone] (fax)  [facility]

## 2022-09-06 NOTE — Telephone Encounter (Signed)
Called, spoke with patient. Scheduled an OV to discuss sharp bladder pains.

## 2022-09-07 ENCOUNTER — Encounter: Payer: Self-pay | Admitting: Physician Assistant

## 2022-09-07 ENCOUNTER — Ambulatory Visit
Admission: RE | Admit: 2022-09-07 | Discharge: 2022-09-07 | Disposition: A | Discharge status: Home / Self Care | Payer: No Typology Code available for payment source | Source: Ambulatory Visit | Attending: Physician Assistant

## 2022-09-07 DIAGNOSIS — Z9049 Acquired absence of other specified parts of digestive tract: Secondary | ICD-10-CM | POA: Diagnosis not present

## 2022-09-07 DIAGNOSIS — R109 Unspecified abdominal pain: Secondary | ICD-10-CM | POA: Diagnosis not present

## 2022-09-07 DIAGNOSIS — R1031 Right lower quadrant pain: Secondary | ICD-10-CM | POA: Insufficient documentation

## 2022-09-09 LAB — URINE CULTURE: Organism ID, Bacteria: NO GROWTH

## 2022-09-11 ENCOUNTER — Telehealth: Payer: Self-pay | Admitting: Family Medicine

## 2022-09-11 DIAGNOSIS — M9903 Segmental and somatic dysfunction of lumbar region: Secondary | ICD-10-CM | POA: Diagnosis not present

## 2022-09-11 DIAGNOSIS — M5416 Radiculopathy, lumbar region: Secondary | ICD-10-CM | POA: Diagnosis not present

## 2022-09-11 DIAGNOSIS — M9905 Segmental and somatic dysfunction of pelvic region: Secondary | ICD-10-CM | POA: Diagnosis not present

## 2022-09-11 DIAGNOSIS — M5136 Other intervertebral disc degeneration, lumbar region: Secondary | ICD-10-CM | POA: Diagnosis not present

## 2022-09-11 NOTE — Telephone Encounter (Signed)
Pt. Given imaging results, verbalizes understanding. States she is still having abdominal pain.  Also states she continues to have urinary symptoms. Would like to change providers to "a female doctor if possible." Please advise pt.

## 2022-09-13 DIAGNOSIS — M5136 Other intervertebral disc degeneration, lumbar region: Secondary | ICD-10-CM | POA: Diagnosis not present

## 2022-09-13 DIAGNOSIS — M5416 Radiculopathy, lumbar region: Secondary | ICD-10-CM | POA: Diagnosis not present

## 2022-09-13 DIAGNOSIS — M9903 Segmental and somatic dysfunction of lumbar region: Secondary | ICD-10-CM | POA: Diagnosis not present

## 2022-09-13 DIAGNOSIS — M9905 Segmental and somatic dysfunction of pelvic region: Secondary | ICD-10-CM | POA: Diagnosis not present

## 2022-09-18 DIAGNOSIS — N184 Chronic kidney disease, stage 4 (severe): Secondary | ICD-10-CM | POA: Diagnosis not present

## 2022-09-18 DIAGNOSIS — D472 Monoclonal gammopathy: Secondary | ICD-10-CM | POA: Diagnosis not present

## 2022-09-18 DIAGNOSIS — E875 Hyperkalemia: Secondary | ICD-10-CM | POA: Diagnosis not present

## 2022-09-18 DIAGNOSIS — E1122 Type 2 diabetes mellitus with diabetic chronic kidney disease: Secondary | ICD-10-CM | POA: Diagnosis not present

## 2022-09-19 ENCOUNTER — Other Ambulatory Visit: Payer: Self-pay

## 2022-09-19 ENCOUNTER — Encounter: Payer: Self-pay | Admitting: Gastroenterology

## 2022-09-19 ENCOUNTER — Telehealth: Payer: Self-pay | Admitting: Family Medicine

## 2022-09-19 ENCOUNTER — Ambulatory Visit: Payer: Self-pay

## 2022-09-19 ENCOUNTER — Ambulatory Visit (INDEPENDENT_AMBULATORY_CARE_PROVIDER_SITE_OTHER): Payer: No Typology Code available for payment source | Admitting: Gastroenterology

## 2022-09-19 VITALS — BP 122/83 | HR 69 | Temp 98.1°F | Wt 198.6 lb

## 2022-09-19 DIAGNOSIS — Z86010 Personal history of colonic polyps: Secondary | ICD-10-CM | POA: Diagnosis not present

## 2022-09-19 DIAGNOSIS — M9905 Segmental and somatic dysfunction of pelvic region: Secondary | ICD-10-CM | POA: Diagnosis not present

## 2022-09-19 DIAGNOSIS — M7918 Myalgia, other site: Secondary | ICD-10-CM

## 2022-09-19 DIAGNOSIS — M9903 Segmental and somatic dysfunction of lumbar region: Secondary | ICD-10-CM | POA: Diagnosis not present

## 2022-09-19 DIAGNOSIS — M5136 Other intervertebral disc degeneration, lumbar region: Secondary | ICD-10-CM | POA: Diagnosis not present

## 2022-09-19 DIAGNOSIS — M5416 Radiculopathy, lumbar region: Secondary | ICD-10-CM | POA: Diagnosis not present

## 2022-09-19 NOTE — Telephone Encounter (Signed)
  Chief Complaint: back pain  Symptoms: back pain that radiates into RLQ abdomen, comes and goes but when present 10/10 Frequency: since May Pertinent Negatives: Patient denies n/v/d  Disposition: [] ED /[] Urgent Care (no appt availability in office) / [x] Appointment(In office/virtual)/ []  [place] [facility]/ [] Home Care/ [] Refused Recommended Disposition /[] [place] [facility]/ []  Follow-up with PCP Additional Notes: pt had OV on 09/06/22 with Lillia Abed, PA and had CT and US done for abdomen and back pain, nothing resulted so had GI referral appt today and they told her FU with PCP d/t needing referral for ortho. No appts with Dr. Payton Mccallum so scheduled pt with Lillia Abed, [place] for 09/21/22. Pt has been taking Tylenol for pain but doesn't get relief.   Reason for Disposition  [1] MODERATE back pain (e.g., interferes with normal activities) AND [2] present > 3 days  Answer Assessment - Initial Assessment Questions 1. ONSET: "When did the pain begin?"      Since May  2. LOCATION: "Where does it hurt?" (upper, mid or lower back)     Lower back  3. SEVERITY: "How bad is the pain?"  (e.g., Scale 1-10; mild, moderate, or severe)   - MILD (1-3): Doesn't interfere with normal activities.    - MODERATE (4-7): Interferes with normal activities or awakens from sleep.    - SEVERE (8-10): Excruciating pain, unable to do any normal activities.      10/10 when present  4. PATTERN: "Is the pain constant?" (e.g., yes, no; constant, intermittent)      Comes and goes  5. RADIATION: "Does the pain shoot into your legs or somewhere else?"     Radiates around into R abdomen  8. MEDICINES: "What have you taken so far for the pain?" (e.g., nothing, acetaminophen, NSAIDS)     Tylenol  10. OTHER SYMPTOMS: "Do you have any other symptoms?" (e.g., fever, abdomen pain, burning with urination, blood in urine)       No, LBM 09/18/22  Protocols used: Back Pain-A-AH

## 2022-09-19 NOTE — Telephone Encounter (Signed)
Referral Request - Has patient seen PCP for this complaint? No , saw a different Dr *If NO, is insurance requiring patient see PCP for this issue before PCP can refer them? Referral for which specialty: ortho Preferred provider/office: ? Reason for referral: back pain

## 2022-09-19 NOTE — Progress Notes (Signed)
Wyline Mood MD, MRCP(U.K) [address]  [address]  [place], [place] [address]  Main: [phone]  Fax: [phone]   Gastroenterology Consultation  Referring Provider:     [name], PA-C Primary Care Physician:  Sherlyn Hay, DO Primary Gastroenterologist:  Dr. Wyline Mood  Reason for Consultation: Right lower quadrant pain        HPI:   Donna Malone is a 77 y.o. y/o female referred for consultation & management  by Dr. Sherlyn Hay, DO.     She was last seen at the office in February 2019 by Dr. Allegra Lai for B12 and iron deficiency anemia.  At that point of time her hemoglobin was 10.9 g.  She was on B12 replacement. Follows with hematology for smoldering myeloma In January 2024 had acute appendicitis and had laparoscopic appendectomy. 10/09/2021 colonoscopy for positive Cologuard showed 2 sessile polyps in the ascending colon 8 sessile polyps in the colon and cecum all the polyps were less than 10 mm 30 mm polyp in the sigmoid colon pedunculated repeat colonoscopy is recommended in 1 year.  Likely Cologuard failure.  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 08/23/2022 CT abdomen pelvis with contrast showed gastric and duodenal diverticula colonic diverticulosis no diverticulitis. 09/07/2022 ultrasound abdomen complete showed status postcholecystectomy no other abnormalities.  08/23/2022 hemoglobin 11.8 g with MCV of 102.9   08/23/2022 seen in the ER right sided abdominal pain 1 week duration workup was negative.Urine analysis showed moderate leukocytosis.  She states that she has had pain in her right side of her abdomen mainly in the back radiating to the front worse with movement since May.  She has chronic issues with her back.  No change in bowel habits.  No abdominal pain. Past Medical History:  Diagnosis Date   Bone spur    heel    Depression    Diabetes mellitus without complication (HCC)    Gout    Hypertension     Past Surgical History:  Procedure Laterality Date   ABDOMINAL HYSTERECTOMY  04/1970   partial   CHOLECYSTECTOMY  late 1990's   COLONOSCOPY WITH PROPOFOL N/A 10/09/2021   Procedure: COLONOSCOPY WITH PROPOFOL;  Surgeon: Wyline Mood, MD;  Location: [facility] ENDOSCOPY;  Service: Gastroenterology;  Laterality: N/A;   LAPAROSCOPIC APPENDECTOMY N/A 03/14/2022   Procedure: APPENDECTOMY LAPAROSCOPIC;  Surgeon: Henrene Dodge, MD;  Location: [facility] ORS;  Service: General;  Laterality: N/A;    Prior to Admission medications   Medication Sig Start Date End Date Taking? Authorizing Provider  Accu-Chek Softclix Lancets lancets Use as instructed 01/17/22   [name], [name]  acetaminophen (TYLENOL) 500 MG tablet Take 500 mg by mouth every 6 (six) hours as needed for mild pain or headache.    [provider]  allopurinol (ZYLOPRIM) 100 MG tablet TAKE 1 TABLET BY MOUTH EVERY DAY 07/25/22   [name], [name]  atorvastatin (LIPITOR) 40 MG tablet TAKE 1 TABLET BY MOUTH EVERY DAY 07/25/22   [name], [name]  B Complex Vitamins (GNP VITAMIN B COMPLEX PO) Take 1 tablet by mouth daily.    [provider]  blood glucose meter kit and supplies Dispense based on patient and insurance preference. Use once daily for FBG. (FOR ICD-10 E10.9, E11.9). 11/01/21   [name], [name]  Blood Glucose Monitoring Suppl DEVI 1 each by Does not apply route in  the morning, at noon, and at bedtime. May substitute to any manufacturer covered by patient's insurance. 07/26/22   [name], [name]  escitalopram (LEXAPRO) 20 MG tablet TAKE 1 TABLET BY MOUTH EVERY DAY 07/25/22   [name], [name]  famotidine (PEPCID) 40 MG tablet TAKE 1 TABLET BY MOUTH EVERY DAY 06/11/22   [name], [name]  ferrous sulfate 325 (65 FE) MG EC tablet Take 325 mg by mouth daily.    [provider]  glipiZIDE (GLUCOTROL XL) 10 MG 24 hr tablet TAKE  1 TABLET (10 MG TOTAL) BY MOUTH DAILY WITH BREAKFAST. 07/25/22   [name], [name]  glucose blood (ACCU-CHEK GUIDE) test strip Use as instructed 01/17/22   [name], [name]  KRILL OIL PO Take by mouth.    [provider]  levothyroxine (SYNTHROID) 75 MCG tablet Take 1 tablet (75 mcg total) by mouth daily before breakfast. 07/02/22   [name], [name]  MAGNESIUM-OXIDE PO Take by mouth.    [provider]  meclizine (ANTIVERT) 25 MG tablet Take 1-2 tablets (25-50 mg total) by mouth 3 (three) times daily as needed for dizziness. 11/29/21   [name], [name]  Multiple Vitamins-Minerals (PRESERVISION AREDS 2) CAPS Take 1 capsule by mouth in the morning and at bedtime.    [provider]  [facility] VERIO test strip USE IN THE MORNING, AT NOON, AND AT BEDTIME. 08/21/22   [name], [name]  Polyethyl Glycol-Propyl Glycol (SYSTANE HYDRATION PF OP) Place 1 drop into both eyes daily as needed (for dry eyes).    [provider]  traZODone (DESYREL) 50 MG tablet TAKE 1 TABLET BY MOUTH EVERYDAY AT BEDTIME 07/02/22   [name], [name]  Vitamin D, Ergocalciferol, (DRISDOL) 1.25 MG (50000 UNIT) CAPS capsule TAKE 1 CAPSULE (50,000 UNITS TOTAL) BY MOUTH EVERY 7 (SEVEN) DAYS. SUNDAY 07/25/22   [name], [name]    Family History  Problem Relation Age of Onset   CAD Mother    Heart attack Mother    Heart disease Father    Diabetes Cousin    Cancer Cousin      Social History   Tobacco Use   Smoking status: Never   Smokeless tobacco: Never  Vaping Use   Vaping Use: Never used  Substance Use Topics   Alcohol use: No   Drug use: No    Allergies as of 09/19/2022 - Review Complete 09/19/2022  Allergen Reaction Noted   Other Other (See Comments) 09/19/2022   Sulfa antibiotics Other (See Comments) 09/21/2014    Review of Systems:    All systems reviewed and negative except where noted in HPI.   Physical Exam:  BP (!) 140/85   Pulse 78   Temp 98.1 F (36.7  C) (Oral)   Wt 198 lb 9.6 oz (90.1 kg)   BMI 35.18 kg/m  No LMP recorded. Patient has had a hysterectomy. Psych:  Alert and cooperative. Normal mood and affect. General:   Alert,  Well-developed, well-nourished, pleasant and cooperative in NAD Head:  Normocephalic and atraumatic. Eyes:  Sclera clear, no icterus.   Conjunctiva pink. Ears:  Normal auditory acuity. She is tender on palpation in the right lower ribs in the infrascapular area radiating to the mid axillary line and also tender over the ribs in the midaxillary line. Abdomen:  Normal bowel sounds.  No bruits.  Soft, non-tender and non-distended without masses, hepatosplenomegaly or hernias noted.  No guarding or rebound tenderness.  Neurologic:  Alert and oriented x3;  grossly normal neurologically. Psych:  Alert and cooperative. Normal mood and affect.  Imaging Studies: US Abdomen Complete  Result Date: 09/07/2022 CLINICAL DATA:  Acute generalized abdominal pain. EXAM: ABDOMEN ULTRASOUND COMPLETE COMPARISON:  Aug 23, 2022.  July 03, 2021. FINDINGS: Gallbladder: Status post cholecystectomy. Common bile duct: Diameter: 3 mm which is within normal limits. Liver: No focal lesion identified. Within normal limits in parenchymal echogenicity. Portal vein is patent on color Doppler imaging with normal direction of blood flow towards the liver. IVC: No abnormality visualized. Pancreas: Visualized portion unremarkable. Spleen: Size and appearance within normal limits. Right Kidney: Length: 8.7 cm. Minimally increased echogenicity of renal parenchyma is noted. No mass or hydronephrosis visualized. Left Kidney: Length: 8.6 cm. Minimally increased echogenicity of renal parenchyma is noted. No mass or hydronephrosis visualized. Abdominal aorta: No aneurysm visualized. Other findings: None. IMPRESSION: Status post cholecystectomy. Mild bilateral renal atrophy as well as increased echogenicity of renal parenchyma is noted suggesting medical renal  disease. Electronically Signed   By: Lupita Raider M.D.   On: 09/07/2022 09:26   CT ABDOMEN PELVIS WO CONTRAST  Result Date: 08/23/2022 CLINICAL DATA:  Right-sided abdominal pain starting proximally 1 week ago. EXAM: CT ABDOMEN AND PELVIS WITHOUT CONTRAST TECHNIQUE: Multidetector CT imaging of the abdomen and pelvis was performed following the standard protocol without IV contrast. RADIATION DOSE REDUCTION: This exam was performed according to the departmental dose-optimization program which includes automated exposure control, adjustment of the mA and/or kV according to patient size and/or use of iterative reconstruction technique. COMPARISON:  03/14/2022 FINDINGS: Lower chest: Descending thoracic aortic and right coronary artery atherosclerotic vascular calcification. Hepatobiliary: Cholecystectomy.  Otherwise unremarkable. Pancreas: Unremarkable Spleen: Unremarkable Adrenals/Urinary Tract: Benign peripelvic cysts, left greater than right. No further imaging workup of these lesions is indicated. Adrenal glands unremarkable. The urinary bladder extends 6 mm below the pubococcygeal line, compatible with pelvic floor laxity. Stomach/Bowel: Gastric diverticulum shown on image 19 series 2. Periampullary duodenal diverticulum without signs of inflammation. Prior appendectomy. Descending and sigmoid colon diverticulosis without findings of active diverticulitis. Low position of the anorectal junction compatible with pelvic floor laxity. Vascular/Lymphatic: Atherosclerosis is present, including aortoiliac atherosclerotic disease. Reproductive: Uterus absent.  Adnexa unremarkable. Other: No supplemental non-categorized findings. Musculoskeletal: Small umbilical hernia contains adipose tissue. Lumbar spondylosis and degenerative disc disease with suspected impingement at L4-5 and L5-S1. IMPRESSION: 1. A specific cause for the patient's right-sided abdominal pain is not identified. 2. Other imaging findings of  potential clinical significance: Coronary atherosclerosis. Gastric and duodenal diverticula. Descending and sigmoid colon diverticulosis without active diverticulitis. Pelvic floor laxity with low position of the anorectal junction and urinary bladder below the pubococcygeal line. Lumbar spondylosis and degenerative disc disease with suspected impingement at L4-5 and L5-S1. Small umbilical hernia contains adipose tissue. Aortic Atherosclerosis (ICD10-I70.0). Electronically Signed   By: Gaylyn Rong M.D.   On: 08/23/2022 14:15    Assessment and Plan:   TALMA LIVELY is a 77 y.o. y/o female has been referred for right lower quadrant pain seen in the ER in May 2024.  She has a history of numerous tubular adenomas on her first colonoscopy in July 2023 after a positive Cologuard.  Likely had a Cologuard failure.  The present pain that she has is musculoskeletal based on my examination this is not GI related.  She has chronic back issues  Plan 1.  Surveillance colonoscopy due to personal history of colon polyps: She should not have future  Cologuard's 2.  Musculoskeletal pain follow-up [name], Sarah N, DO consider further imaging of the back to rule out no compression may benefit from physical therapy.  I have discussed alternative options, risks & benefits,  which include, but are not limited to, bleeding, infection, perforation,respiratory complication & drug reaction.  The patient agrees with this plan & written consent will be obtained.      Follow up in as needed  Dr Wyline Mood MD,MRCP(U.K)

## 2022-09-20 ENCOUNTER — Other Ambulatory Visit: Payer: Self-pay | Admitting: Family Medicine

## 2022-09-20 NOTE — Telephone Encounter (Signed)
Requested Prescriptions  Pending Prescriptions Disp Refills   ONETOUCH VERIO test strip [Pharmacy Med Name: ONE TOUCH VERIO TEST STRIP] 100 strip 0    Sig: USE IN THE MORNING, AT NOON, AND AT BEDTIME.     Endocrinology: Diabetes - Testing Supplies Passed - 09/20/2022  2:34 AM      Passed - Valid encounter within last 12 months    Recent Outpatient Visits           2 weeks ago Right lower quadrant abdominal pain   [place] [place] [facility] [name], PA-C   3 weeks ago Right lower quadrant abdominal pain   [facility] [name], PA-C   4 months ago Annual physical exam   [facility] [name], [name]   10 months ago Type 2 diabetes mellitus with stage 4 chronic kidney disease, with long-term current use of insulin [facility])   [facility] [name], [name]   1 year ago Type 2 diabetes mellitus with stage 3b chronic kidney disease, without long-term current use of insulin [facility])   [facility] [name], [name]       Future Appointments             Tomorrow [name], PA-C [facility], [facility]

## 2022-09-21 ENCOUNTER — Ambulatory Visit (INDEPENDENT_AMBULATORY_CARE_PROVIDER_SITE_OTHER): Payer: No Typology Code available for payment source | Admitting: Physician Assistant

## 2022-09-21 ENCOUNTER — Encounter: Payer: Self-pay | Admitting: Physician Assistant

## 2022-09-21 VITALS — BP 132/69 | HR 66 | Temp 98.3°F | Resp 13 | Wt 198.9 lb

## 2022-09-21 DIAGNOSIS — G8929 Other chronic pain: Secondary | ICD-10-CM

## 2022-09-21 DIAGNOSIS — M25551 Pain in right hip: Secondary | ICD-10-CM

## 2022-09-21 DIAGNOSIS — M545 Low back pain, unspecified: Secondary | ICD-10-CM | POA: Diagnosis not present

## 2022-09-21 NOTE — Progress Notes (Signed)
I,Donna  Malone,acting as a Neurosurgeon for [employer], PA-C.,have documented all relevant documentation on the behalf of Donna Ferguson, PA-C,as directed by  Donna Ferguson, PA-C while in the presence of Donna Ferguson, PA-C.   Established patient visit   Patient: Donna Malone   DOB: 15-Nov-1945   77 y.o. Female  MRN: [id number] Visit Date: 09/21/2022  Today's healthcare provider: [name], PA-C   Cc. Persistent RLQ abdominal and back pain  Subjective    HPI   Discussed the use of AI scribe software for clinical note transcription with the patient, who gave verbal consent to proceed.  History of Present Illness   The patient presents with persistent back pain/ RLQ abdominal pain, previously evaluated by a gastroenterologist and determined to be musculoskeletal. The patient reports the pain is severe enough to hinder movement, including turning over and walking. The patient has been seeing a chiropractor twice a week for pain management, which includes the use of a TENS unit. The patient also reports chronic shoulder pain, which is constant and never subsides. The patient has a history of receiving cortisone shots for pain management in other areas, including the hip and feet. The patient is resistant to the idea of physical therapy due to previous negative experiences and the current severity of the pain.       Medications: Outpatient Medications Prior to Visit  Medication Sig   Accu-Chek Softclix Lancets lancets Use as instructed   acetaminophen (TYLENOL) 500 MG tablet Take 500 mg by mouth every 6 (six) hours as needed for mild pain or headache.   allopurinol (ZYLOPRIM) 100 MG tablet TAKE 1 TABLET BY MOUTH EVERY DAY   atorvastatin (LIPITOR) 40 MG tablet TAKE 1 TABLET BY MOUTH EVERY DAY   B Complex Vitamins (GNP VITAMIN B COMPLEX PO) Take 1 tablet by mouth daily.   blood glucose meter kit and supplies Dispense based on patient and insurance preference. Use once daily  for FBG. (FOR ICD-10 E10.9, E11.9).   Blood Glucose Monitoring Suppl DEVI 1 each by Does not apply route in the morning, at noon, and at bedtime. May substitute to any manufacturer covered by patient's insurance.   escitalopram (LEXAPRO) 20 MG tablet TAKE 1 TABLET BY MOUTH EVERY DAY   famotidine (PEPCID) 40 MG tablet TAKE 1 TABLET BY MOUTH EVERY DAY   ferrous sulfate 325 (65 FE) MG EC tablet Take 325 mg by mouth daily.   glipiZIDE (GLUCOTROL XL) 10 MG 24 hr tablet TAKE 1 TABLET (10 MG TOTAL) BY MOUTH DAILY WITH BREAKFAST.   glucose blood (ACCU-CHEK GUIDE) test strip Use as instructed   KRILL OIL PO Take by mouth.   levothyroxine (SYNTHROID) 75 MCG tablet Take 1 tablet (75 mcg total) by mouth daily before breakfast.   MAGNESIUM-OXIDE PO Take by mouth.   meclizine (ANTIVERT) 25 MG tablet Take 1-2 tablets (25-50 mg total) by mouth 3 (three) times daily as needed for dizziness.   Multiple Vitamins-Minerals (PRESERVISION AREDS 2) CAPS Take 1 capsule by mouth in the morning and at bedtime.   ONETOUCH VERIO test strip USE IN THE MORNING, AT NOON, AND AT BEDTIME.   Polyethyl Glycol-Propyl Glycol (SYSTANE HYDRATION PF OP) Place 1 drop into both eyes daily as needed (for dry eyes).   traZODone (DESYREL) 50 MG tablet TAKE 1 TABLET BY MOUTH EVERYDAY AT BEDTIME   Vitamin D, Ergocalciferol, (DRISDOL) 1.25 MG (50000 UNIT) CAPS capsule TAKE 1 CAPSULE (50,000 UNITS TOTAL) BY MOUTH EVERY 7 (SEVEN) DAYS.  SUNDAY   No facility-administered medications prior to visit.   Review of Systems  Gastrointestinal:  Positive for abdominal pain.  Neurological:  Positive for numbness.  All other systems reviewed and are negative.    Objective    BP 132/69 (BP Location: Left Arm, Patient Position: Sitting, Cuff Size: Normal)   Pulse 66   Temp 98.3 F (36.8 C) (Oral)   Resp 13   Wt 198 lb 14.4 oz (90.2 kg)   SpO2 96%   BMI 35.23 kg/m   Physical Exam Vitals reviewed.  Constitutional:      Appearance: She is not  ill-appearing.  HENT:     Head: Normocephalic.  Eyes:     Conjunctiva/sclera: Conjunctivae normal.  Cardiovascular:     Rate and Rhythm: Normal rate.  Pulmonary:     Effort: Pulmonary effort is normal. No respiratory distress.  Neurological:     General: No focal deficit present.     Mental Status: She is alert and oriented to person, place, and time.  Psychiatric:        Mood and Affect: Mood normal.        Behavior: Behavior normal.      No results found for any visits on 09/21/22.  Assessment & Plan     Assessment and Plan    Back Pain/abdominal pain: Likely musculoskeletal in origin. Recent imaging 07/2022 showed no compression fractures, only degenerative changes. Patient has been seeing a chiropractor and is reluctant to pursue physical therapy. -Refer to orthopedic specialist for further evaluation and management. Still have suspicion for Rt hip involvement.  Shoulder Pain: Chronic pain in the shoulder blades. History of shoulder fracture. -Address with orthopedic specialist during consultation for back pain.       I, Donna Ferguson, PA-C have reviewed all documentation for this visit. The documentation on  09/21/22   for the exam, diagnosis, procedures, and orders are all accurate and complete.  Donna Ferguson, PA-C [facility] [address] [place], [place], [address] Office: [phone] Fax: [phone]   [facility]

## 2022-09-25 ENCOUNTER — Ambulatory Visit: Payer: Self-pay

## 2022-09-25 NOTE — Patient Outreach (Signed)
  Care Coordination   Initial Visit Note   09/25/2022 Name: Donna Malone MRN: [id number] DOB: [birth year]  Donna Malone is a 77 y.o. year old female who sees [name], [name], DO for primary care. I spoke with  Donna Malone by phone today.  What matters to the patients health and wellness today?  CM educated patient on [facility] services.  Patient declines services and feels that she is able to manage her medical needs.  Patient agreed to contact her provider in the future if she needs [facility] services.    Goals Addressed   None     SDOH assessments and interventions completed:  No     Care Coordination Interventions:  No, not indicated   Follow up plan: No further intervention required.   Encounter Outcome:  Pt. Refused

## 2022-09-26 DIAGNOSIS — M5416 Radiculopathy, lumbar region: Secondary | ICD-10-CM | POA: Diagnosis not present

## 2022-09-26 DIAGNOSIS — M5136 Other intervertebral disc degeneration, lumbar region: Secondary | ICD-10-CM | POA: Diagnosis not present

## 2022-09-26 DIAGNOSIS — M9903 Segmental and somatic dysfunction of lumbar region: Secondary | ICD-10-CM | POA: Diagnosis not present

## 2022-09-26 DIAGNOSIS — M9905 Segmental and somatic dysfunction of pelvic region: Secondary | ICD-10-CM | POA: Diagnosis not present

## 2022-10-01 DIAGNOSIS — M9905 Segmental and somatic dysfunction of pelvic region: Secondary | ICD-10-CM | POA: Diagnosis not present

## 2022-10-01 DIAGNOSIS — M5416 Radiculopathy, lumbar region: Secondary | ICD-10-CM | POA: Diagnosis not present

## 2022-10-01 DIAGNOSIS — M9903 Segmental and somatic dysfunction of lumbar region: Secondary | ICD-10-CM | POA: Diagnosis not present

## 2022-10-01 DIAGNOSIS — M5136 Other intervertebral disc degeneration, lumbar region: Secondary | ICD-10-CM | POA: Diagnosis not present

## 2022-10-03 DIAGNOSIS — M25551 Pain in right hip: Secondary | ICD-10-CM | POA: Diagnosis not present

## 2022-10-08 DIAGNOSIS — M9905 Segmental and somatic dysfunction of pelvic region: Secondary | ICD-10-CM | POA: Diagnosis not present

## 2022-10-08 DIAGNOSIS — M9903 Segmental and somatic dysfunction of lumbar region: Secondary | ICD-10-CM | POA: Diagnosis not present

## 2022-10-08 DIAGNOSIS — M5136 Other intervertebral disc degeneration, lumbar region: Secondary | ICD-10-CM | POA: Diagnosis not present

## 2022-10-08 DIAGNOSIS — M5416 Radiculopathy, lumbar region: Secondary | ICD-10-CM | POA: Diagnosis not present

## 2022-10-10 DIAGNOSIS — M5136 Other intervertebral disc degeneration, lumbar region: Secondary | ICD-10-CM | POA: Diagnosis not present

## 2022-10-10 DIAGNOSIS — M9903 Segmental and somatic dysfunction of lumbar region: Secondary | ICD-10-CM | POA: Diagnosis not present

## 2022-10-10 DIAGNOSIS — M5416 Radiculopathy, lumbar region: Secondary | ICD-10-CM | POA: Diagnosis not present

## 2022-10-10 DIAGNOSIS — M9905 Segmental and somatic dysfunction of pelvic region: Secondary | ICD-10-CM | POA: Diagnosis not present

## 2022-10-20 ENCOUNTER — Other Ambulatory Visit: Payer: Self-pay | Admitting: Family Medicine

## 2022-10-20 DIAGNOSIS — F3342 Major depressive disorder, recurrent, in full remission: Secondary | ICD-10-CM

## 2022-10-22 ENCOUNTER — Other Ambulatory Visit: Payer: Self-pay | Admitting: Family Medicine

## 2022-10-23 NOTE — Telephone Encounter (Signed)
Requested Prescriptions  Pending Prescriptions Disp Refills   ONETOUCH VERIO test strip [Pharmacy Med Name: ONE TOUCH VERIO TEST STRIP] 100 strip 0    Sig: USE IN THE MORNING, AT NOON, AND AT BEDTIME.     Endocrinology: Diabetes - Testing Supplies Passed - 10/22/2022  2:33 AM      Passed - Valid encounter within last 12 months    Recent Outpatient Visits           1 month ago Right hip pain   [place] [facility] [name], PA-C   1 month ago Right lower quadrant abdominal pain   [place] [facility] [name], PA-C   1 month ago Right lower quadrant abdominal pain   [facility] [name], PA-C   5 months ago Annual physical exam   [facility] [name], [name]   11 months ago Type 2 diabetes mellitus with stage 4 chronic kidney disease, with long-term current use of insulin [facility])   [facility] [name], [name]       Future Appointments             In 1 week [name], [name], DO [place] [facility], [facility]

## 2022-10-30 ENCOUNTER — Other Ambulatory Visit: Payer: Self-pay | Admitting: Family Medicine

## 2022-10-30 DIAGNOSIS — E1122 Type 2 diabetes mellitus with diabetic chronic kidney disease: Secondary | ICD-10-CM

## 2022-10-31 NOTE — Telephone Encounter (Signed)
Requested Prescriptions  Pending Prescriptions Disp Refills   glipiZIDE (GLUCOTROL XL) 10 MG 24 hr tablet [Pharmacy Med Name: GLIPIZIDE ER 10 MG TABLET] 90 tablet 0    Sig: TAKE 1 TABLET (10 MG TOTAL) BY MOUTH DAILY WITH BREAKFAST.     Endocrinology:  Diabetes - Sulfonylureas Failed - 10/30/2022  2:52 PM      Failed - HBA1C is between 0 and 7.9 and within 180 days    Hgb A1c MFr Bld  Date Value Ref Range Status  05/01/2022 6.1 (H) 4.8 - 5.6 % Final    Comment:             Prediabetes: 5.7 - 6.4          Diabetes: >6.4          Glycemic control for adults with diabetes: <7.0          Failed - Cr in normal range and within 360 days    Creatinine  Date Value Ref Range Status  08/23/2022 2.72 (H) 0.44 - 1.00 mg/dL Final   Creatinine, Urine  Date Value Ref Range Status  03/09/2022 130 mg/dL Final         Passed - Valid encounter within last 6 months    Recent Outpatient Visits           1 month ago Right hip pain   [place] [facility] [name], PA-C   1 month ago Right lower quadrant abdominal pain   [facility] [name], PA-C   2 months ago Right lower quadrant abdominal pain   [facility] [name], PA-C   6 months ago Annual physical exam   [facility] [name], [name]   12 months ago Type 2 diabetes mellitus with stage 4 chronic kidney disease, with long-term current use of insulin [facility])   [name] [name], [name]       Future Appointments             Tomorrow [name], [name], DO [facility], [facility]

## 2022-11-01 ENCOUNTER — Encounter: Payer: Self-pay | Admitting: Family Medicine

## 2022-11-01 ENCOUNTER — Other Ambulatory Visit: Payer: Self-pay | Admitting: Family Medicine

## 2022-11-01 ENCOUNTER — Ambulatory Visit (INDEPENDENT_AMBULATORY_CARE_PROVIDER_SITE_OTHER): Payer: No Typology Code available for payment source | Admitting: Family Medicine

## 2022-11-01 VITALS — BP 114/72 | HR 67 | Temp 98.1°F | Ht 63.0 in | Wt 196.0 lb

## 2022-11-01 DIAGNOSIS — M5137 Other intervertebral disc degeneration, lumbosacral region: Secondary | ICD-10-CM

## 2022-11-01 DIAGNOSIS — K5750 Diverticulosis of both small and large intestine without perforation or abscess without bleeding: Secondary | ICD-10-CM | POA: Diagnosis not present

## 2022-11-01 DIAGNOSIS — E034 Atrophy of thyroid (acquired): Secondary | ICD-10-CM

## 2022-11-01 DIAGNOSIS — N184 Chronic kidney disease, stage 4 (severe): Secondary | ICD-10-CM | POA: Diagnosis not present

## 2022-11-01 DIAGNOSIS — E785 Hyperlipidemia, unspecified: Secondary | ICD-10-CM

## 2022-11-01 DIAGNOSIS — E0822 Diabetes mellitus due to underlying condition with diabetic chronic kidney disease: Secondary | ICD-10-CM

## 2022-11-01 DIAGNOSIS — D649 Anemia, unspecified: Secondary | ICD-10-CM | POA: Diagnosis not present

## 2022-11-01 DIAGNOSIS — Z794 Long term (current) use of insulin: Secondary | ICD-10-CM | POA: Diagnosis not present

## 2022-11-01 DIAGNOSIS — E559 Vitamin D deficiency, unspecified: Secondary | ICD-10-CM

## 2022-11-01 DIAGNOSIS — M25551 Pain in right hip: Secondary | ICD-10-CM | POA: Diagnosis not present

## 2022-11-01 DIAGNOSIS — E1169 Type 2 diabetes mellitus with other specified complication: Secondary | ICD-10-CM | POA: Diagnosis not present

## 2022-11-01 DIAGNOSIS — I2510 Atherosclerotic heart disease of native coronary artery without angina pectoris: Secondary | ICD-10-CM | POA: Diagnosis not present

## 2022-11-01 MED ORDER — TIRZEPATIDE 2.5 MG/0.5ML ~~LOC~~ SOAJ
2.5000 mg | SUBCUTANEOUS | 0 refills | Status: DC
Start: 2022-11-01 — End: 2022-11-05

## 2022-11-01 NOTE — Progress Notes (Signed)
Established patient visit   Patient: Donna Malone   DOB: [birth year]   77 y.o. Female  MRN: [id number] Visit Date: 11/01/2022  Today's healthcare provider: Sherlyn Hay, DO   Chief Complaint  Patient presents with   Establish Care    Here to meet new provider   Diabetes    Patient states that her glucose has been doing well staying about 100-140.   Subjective    HPI HPI     Establish Care    Additional comments: Here to meet new provider        Diabetes    Additional comments: Patient states that her glucose has been doing well staying about 100-140.      Last edited by Adline Peals, CMA on 11/01/2022  9:20 AM.    Patient presents today for follow-up.  She is new to me today, as her previous provider has transitioned to a different clinic.  Right-sided lower abdominal pain/hip:  - Dr. Odis Luster wanted to do an MRI of her pelvis if she didn't have improvement in 4-6 weeks.  - Muscle relaxer is not helping patient's pain  - Pain is intermittent and not currently present  - She notes that it is predominantly all along her right side under her belt line, starting anteriorly and wrapping around posteriorly.  Multiple myeloma:  - Dr Bethanne Ginger note: "Ideally a kidney biopsy will clarify this to help to determine whether patient's case is smoldering multiple myeloma versus active multiple myeloma."   - Patient did decline a kidney biopsy at several visits and states today that she is still not interested in one unless absolutely necessary.     Medications: Outpatient Medications Prior to Visit  Medication Sig   Accu-Chek Softclix Lancets lancets Use as instructed   acetaminophen (TYLENOL) 500 MG tablet Take 500 mg by mouth every 6 (six) hours as needed for mild pain or headache.   allopurinol (ZYLOPRIM) 100 MG tablet TAKE 1 TABLET BY MOUTH EVERY DAY   atorvastatin (LIPITOR) 40 MG tablet TAKE 1 TABLET BY MOUTH EVERY DAY   B Complex Vitamins (GNP VITAMIN B  COMPLEX PO) Take 1 tablet by mouth daily.   blood glucose meter kit and supplies Dispense based on patient and insurance preference. Use once daily for FBG. (FOR ICD-10 E10.9, E11.9).   Blood Glucose Monitoring Suppl DEVI 1 each by Does not apply route in the morning, at noon, and at bedtime. May substitute to any manufacturer covered by patient's insurance.   escitalopram (LEXAPRO) 20 MG tablet TAKE 1 TABLET BY MOUTH EVERY DAY   famotidine (PEPCID) 40 MG tablet TAKE 1 TABLET BY MOUTH EVERY DAY   ferrous sulfate 325 (65 FE) MG EC tablet Take 325 mg by mouth daily.   glipiZIDE (GLUCOTROL XL) 10 MG 24 hr tablet TAKE 1 TABLET (10 MG TOTAL) BY MOUTH DAILY WITH BREAKFAST.   glucose blood (ACCU-CHEK GUIDE) test strip Use as instructed   KRILL OIL PO Take by mouth.   levothyroxine (SYNTHROID) 75 MCG tablet Take 1 tablet (75 mcg total) by mouth daily before breakfast.   MAGNESIUM-OXIDE PO Take by mouth.   meclizine (ANTIVERT) 25 MG tablet Take 1-2 tablets (25-50 mg total) by mouth 3 (three) times daily as needed for dizziness.   methocarbamol (ROBAXIN) 500 MG tablet Take 500 mg by mouth 4 (four) times daily.   Multiple Vitamins-Minerals (PRESERVISION AREDS 2) CAPS Take 1 capsule by mouth in the morning and at bedtime.  ONETOUCH VERIO test strip USE IN THE MORNING, AT NOON, AND AT BEDTIME.   Polyethyl Glycol-Propyl Glycol (SYSTANE HYDRATION PF OP) Place 1 drop into both eyes daily as needed (for dry eyes).   traZODone (DESYREL) 50 MG tablet TAKE 1 TABLET BY MOUTH EVERYDAY AT BEDTIME   Vitamin D, Ergocalciferol, (DRISDOL) 1.25 MG (50000 UNIT) CAPS capsule TAKE 1 CAPSULE (50,000 UNITS TOTAL) BY MOUTH EVERY 7 (SEVEN) DAYS. SUNDAY   No facility-administered medications prior to visit.    Review of Systems  Constitutional:  Negative for appetite change, chills, fatigue and fever.  Respiratory:  Negative for chest tightness and shortness of breath.   Cardiovascular:  Negative for chest pain and  palpitations.  Gastrointestinal:  Negative for abdominal pain, nausea and vomiting.  Musculoskeletal:        + describes pain under beltline on right side, which occurs intermittently.  Skin:  Negative for color change, rash and wound.  Neurological:  Negative for dizziness and weakness.  Hematological:  Negative for adenopathy.         Objective    BP 114/72 (BP Location: Right Arm, Patient Position: Sitting, Cuff Size: Normal)   Pulse 67   Temp 98.1 F (36.7 C) (Oral)   Ht 5\' 3"  (1.6 m)   Wt 196 lb (88.9 kg)   SpO2 98%   BMI 34.72 kg/m      Physical Exam Vitals and nursing note reviewed.  Constitutional:      General: She is not in acute distress.    Appearance: Normal appearance.  HENT:     Head: Normocephalic and atraumatic.  Eyes:     General: No scleral icterus.    Conjunctiva/sclera: Conjunctivae normal.  Cardiovascular:     Rate and Rhythm: Normal rate.  Pulmonary:     Effort: Pulmonary effort is normal.  Abdominal:     Tenderness: There is no abdominal tenderness.  Skin:    General: Skin is warm and dry.     Findings: No bruising, lesion or rash.  Neurological:     Mental Status: She is alert and oriented to person, place, and time. Mental status is at baseline.  Psychiatric:        Mood and Affect: Mood normal.        Behavior: Behavior normal.        Assessment & Plan    Right hip pain Assessment & Plan: Patient continues to have hip pain that is unrelieved by the muscle relaxer she was given (methocarbamol).  I discussed with her that her orthopedist, Dr. Odis Luster, wanted her to follow-up with them if she continued to have pain, with the plan to do an MRI at that point.  She originally was planning to not do the MRI he was recommending, thinking that the PET scan she will eventually get again through her oncologist was better.  However, I discussed with her and showed her images demonstrating that the MRI will give very different information than  the PET scan and I recommended that she should go ahead and follow-up to do it.   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  We will recheck her A1c today.  I suspect it will be well within desired range.  She believes she has had some episodes of hypoglycemia.  I recommended we go ahead and try a GLP-1 for her, as we  will have several additional benefits as well.  Prescribed Mounjaro as noted below.  Patient follows with nephrology for her chronic kidney disease stage 4.  Orders: -     Comprehensive metabolic panel -     Hemoglobin A1c -     Lipid Panel With LDL/HDL Ratio -     Tirzepatide; Inject 2.5 mg into the skin once a week for 28 days.  Dispense: 2 mL; Refill: 0  Hyperlipidemia associated with type 2 diabetes mellitus (HCC) Assessment & Plan: Will recheck her cholesterol panel today and continue her atorvastatin 40 mg daily.  Orders: -     Comprehensive metabolic panel -     Lipid Panel With LDL/HDL Ratio  Vitamin D deficiency, unspecified Assessment & Plan: Patient is taking high-dose vitamin D supplement currently for vitamin D deficiency.  Will recheck her level today.  Orders: -     VITAMIN D 25 Hydroxy (Vit-D Deficiency, Fractures)  Hypothyroidism due to acquired atrophy of thyroid Assessment & Plan: Will check patient's TSH level today and adjust patient's levothyroxine dose if indicated.  Orders: -     TSH  Anemia, unspecified type -     CBC with Differential/Platelet  Atherosclerosis of coronary artery of native heart, unspecified vessel or lesion type, unspecified whether angina present -     Tirzepatide; Inject 2.5 mg into the skin once a week for 28 days.  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  Not bothering patient at this time.  Will continue to monitor.     Return in about 3 months (around 02/01/2023) for f/u.     There are other unrelated non-urgent complaints, but due to the busy schedule and the amount of time I've already spent with her, time does not permit me to address these routine issues at today's visit. I've requested another appointment to review these additional issues.  I discussed the assessment and treatment plan with the patient  The patient was provided an opportunity to ask questions and all were answered. The patient agreed with the plan and demonstrated an understanding of the instructions.   The patient was advised to call back or seek an in-person evaluation if the symptoms worsen or if the condition fails to improve as anticipated.  Total time was 40  minutes. That includes chart review before the visit, the actual patient visit, and time spent on documentation after the visit.    Sherlyn Hay, DO  [facility] [phone] (phone) [phone] (fax)  [facility]

## 2022-11-02 ENCOUNTER — Encounter: Payer: Self-pay | Admitting: Family Medicine

## 2022-11-02 ENCOUNTER — Other Ambulatory Visit: Payer: Self-pay

## 2022-11-02 ENCOUNTER — Telehealth: Payer: Self-pay | Admitting: Family Medicine

## 2022-11-02 DIAGNOSIS — K5750 Diverticulosis of both small and large intestine without perforation or abscess without bleeding: Secondary | ICD-10-CM | POA: Insufficient documentation

## 2022-11-02 DIAGNOSIS — K429 Umbilical hernia without obstruction or gangrene: Secondary | ICD-10-CM | POA: Insufficient documentation

## 2022-11-02 DIAGNOSIS — M51379 Other intervertebral disc degeneration, lumbosacral region without mention of lumbar back pain or lower extremity pain: Secondary | ICD-10-CM | POA: Insufficient documentation

## 2022-11-02 DIAGNOSIS — D649 Anemia, unspecified: Secondary | ICD-10-CM | POA: Insufficient documentation

## 2022-11-02 DIAGNOSIS — N8189 Other female genital prolapse: Secondary | ICD-10-CM | POA: Insufficient documentation

## 2022-11-02 DIAGNOSIS — M4306 Spondylolysis, lumbar region: Secondary | ICD-10-CM | POA: Insufficient documentation

## 2022-11-02 DIAGNOSIS — M5136 Other intervertebral disc degeneration, lumbar region: Secondary | ICD-10-CM | POA: Insufficient documentation

## 2022-11-02 DIAGNOSIS — M51369 Other intervertebral disc degeneration, lumbar region without mention of lumbar back pain or lower extremity pain: Secondary | ICD-10-CM | POA: Insufficient documentation

## 2022-11-02 DIAGNOSIS — M25551 Pain in right hip: Secondary | ICD-10-CM | POA: Insufficient documentation

## 2022-11-02 DIAGNOSIS — M5137 Other intervertebral disc degeneration, lumbosacral region: Secondary | ICD-10-CM | POA: Insufficient documentation

## 2022-11-02 DIAGNOSIS — I2510 Atherosclerotic heart disease of native coronary artery without angina pectoris: Secondary | ICD-10-CM | POA: Insufficient documentation

## 2022-11-02 NOTE — Assessment & Plan Note (Signed)
Will check patient's TSH level today and adjust patient's levothyroxine dose if indicated.

## 2022-11-02 NOTE — Assessment & Plan Note (Signed)
Patient continues to have hip pain that is unrelieved by the muscle relaxer she was given (methocarbamol).  I discussed with her that her orthopedist, Dr. Odis Luster, wanted her to follow-up with them if she continued to have pain, with the plan to do an MRI at that point.  She originally was planning to not do the MRI he was recommending, thinking that the PET scan she will eventually get again through her oncologist was better.  However, I discussed with her and showed her images demonstrating that the MRI will give very different information than the PET scan and I recommended that she should go ahead and follow-up to do it.

## 2022-11-02 NOTE — Assessment & Plan Note (Signed)
Noted on CT scan in May 2024.  Not bothering patient at this time.  Will continue to monitor.

## 2022-11-02 NOTE — Assessment & Plan Note (Deleted)
CT  08/23/22:  Lumbar spondylosis and degenerative disc disease with suspected impingement at L4-5 and L5-S1.

## 2022-11-02 NOTE — Assessment & Plan Note (Signed)
Will recheck her cholesterol panel today and continue her atorvastatin 40 mg daily.

## 2022-11-02 NOTE — Assessment & Plan Note (Addendum)
Patient reports her blood sugars is being 100s-140s.  We will recheck her A1c today.  I suspect it will be well within desired range.  She believes she has had some episodes of hypoglycemia.  I recommended we go ahead and try a GLP-1 for her, as we will have several additional benefits as well.  Prescribed Mounjaro as noted below.  Patient follows with nephrology for her chronic kidney disease stage 4.

## 2022-11-02 NOTE — Telephone Encounter (Signed)
Pt called  back asking if the [facility] [place] [facility]  She said she checked and [facility].

## 2022-11-02 NOTE — Assessment & Plan Note (Signed)
Patient is taking high-dose vitamin D supplement currently for vitamin D deficiency.  Will recheck her level today.

## 2022-11-05 ENCOUNTER — Other Ambulatory Visit: Payer: Self-pay | Admitting: Family Medicine

## 2022-11-05 ENCOUNTER — Other Ambulatory Visit: Payer: Self-pay

## 2022-11-05 DIAGNOSIS — I2510 Atherosclerotic heart disease of native coronary artery without angina pectoris: Secondary | ICD-10-CM

## 2022-11-05 DIAGNOSIS — M9903 Segmental and somatic dysfunction of lumbar region: Secondary | ICD-10-CM | POA: Diagnosis not present

## 2022-11-05 DIAGNOSIS — N184 Chronic kidney disease, stage 4 (severe): Secondary | ICD-10-CM

## 2022-11-05 DIAGNOSIS — M5136 Other intervertebral disc degeneration, lumbar region: Secondary | ICD-10-CM | POA: Diagnosis not present

## 2022-11-05 DIAGNOSIS — M9905 Segmental and somatic dysfunction of pelvic region: Secondary | ICD-10-CM | POA: Diagnosis not present

## 2022-11-05 DIAGNOSIS — M5416 Radiculopathy, lumbar region: Secondary | ICD-10-CM | POA: Diagnosis not present

## 2022-11-05 MED ORDER — TIRZEPATIDE 2.5 MG/0.5ML ~~LOC~~ SOAJ: 2.5000 mg | SUBCUTANEOUS | 0 refills | Status: DC

## 2022-11-05 NOTE — Telephone Encounter (Signed)
Medication Refill - Medication: tirzepatide Greggory Keen) 2.5 MG/0.5ML Pen    ([facility] and [facility] was no longer available)  Has the patient contacted their pharmacy? Yes.    Preferred Pharmacy (with phone number or street name):    [facility] [address] [facility] [place],  [place]  [address]     Has the patient been seen for an appointment in the last year OR does the patient have an upcoming appointment? Yes.    Agent: Please be advised that RX refills may take up to 3 business days. We ask that you follow-up with your pharmacy.

## 2022-11-05 NOTE — Telephone Encounter (Signed)
Rx was sent in at 11 am today.  Message left for patient that she should be able to get it

## 2022-11-05 NOTE — Telephone Encounter (Signed)
Requested medication (s) are due for refill today: Yes  Requested medication (s) are on the active medication list: Yes  Last refill:  11/05/22  Future visit scheduled: Yes  Notes to clinic:  Unable to refill due to no refill protocol for this medication. [facility]end to a different [facility]     Requested Prescriptions  Pending Prescriptions Disp Refills   tirzepatide (MOUNJARO) 2.5 MG/0.5ML Pen 2 mL 0    Sig: Inject 2.5 mg into the skin once a week for 28 days.     Off-Protocol Failed - 11/05/2022  5:15 PM      Failed - Medication not assigned to a protocol, review manually.      Passed - Valid encounter within last 12 months    Recent Outpatient Visits           4 days ago Right hip pain   [facility] Misenheimer, [name], DO   1 month ago Right hip pain   [facility] [name], PA-C   2 months ago Right lower quadrant abdominal pain   [facility] [name], PA-C   2 months ago Right lower quadrant abdominal pain   [facility] [name], PA-C   6 months ago Annual physical exam   [facility] [name], [name]       Future Appointments             In 6 months [name], [name], DO [facility]

## 2022-11-05 NOTE — Telephone Encounter (Signed)
Pt calling back, still waiting on [facility] of [facility]  [address], [place], [place] [address]. [phone]. Pt would like to get this today if possible., message last sent 08/01

## 2022-11-06 ENCOUNTER — Other Ambulatory Visit: Payer: Self-pay

## 2022-11-06 MED ORDER — TIRZEPATIDE 2.5 MG/0.5ML ~~LOC~~ SOAJ
2.5000 mg | SUBCUTANEOUS | 0 refills | Status: DC
  Filled 2022-11-06: qty 2, 28d supply, fill #0

## 2022-11-07 ENCOUNTER — Other Ambulatory Visit: Payer: Self-pay

## 2022-11-12 DIAGNOSIS — M5136 Other intervertebral disc degeneration, lumbar region: Secondary | ICD-10-CM | POA: Diagnosis not present

## 2022-11-12 DIAGNOSIS — M5416 Radiculopathy, lumbar region: Secondary | ICD-10-CM | POA: Diagnosis not present

## 2022-11-12 DIAGNOSIS — M9905 Segmental and somatic dysfunction of pelvic region: Secondary | ICD-10-CM | POA: Diagnosis not present

## 2022-11-12 DIAGNOSIS — M9903 Segmental and somatic dysfunction of lumbar region: Secondary | ICD-10-CM | POA: Diagnosis not present

## 2022-11-12 DIAGNOSIS — E113393 Type 2 diabetes mellitus with moderate nonproliferative diabetic retinopathy without macular edema, bilateral: Secondary | ICD-10-CM | POA: Diagnosis not present

## 2022-11-13 ENCOUNTER — Telehealth: Payer: Self-pay

## 2022-11-13 NOTE — Telephone Encounter (Signed)
Pt called to let us know that she stopped glipizide and has now started [facility]... Pt is aware that she will need to hold the [facility] at least 7 days prior to procedure... Pt advised to hold from today until after procedure as she takes medication on Mondays...   Med list updated

## 2022-11-14 ENCOUNTER — Telehealth: Payer: Self-pay

## 2022-11-14 NOTE — Telephone Encounter (Signed)
Copied from CRM [id number]. Topic: General - Call Back - No Documentation >> Nov 14, 2022 10:13 AM Payton Doughty wrote: Reason for CRM: pt would like results of her labs done 08/01.  She would appreciate a cb.

## 2022-11-16 ENCOUNTER — Encounter: Payer: Self-pay | Admitting: Gastroenterology

## 2022-11-19 DIAGNOSIS — M5136 Other intervertebral disc degeneration, lumbar region: Secondary | ICD-10-CM | POA: Diagnosis not present

## 2022-11-19 DIAGNOSIS — M9905 Segmental and somatic dysfunction of pelvic region: Secondary | ICD-10-CM | POA: Diagnosis not present

## 2022-11-19 DIAGNOSIS — M9903 Segmental and somatic dysfunction of lumbar region: Secondary | ICD-10-CM | POA: Diagnosis not present

## 2022-11-19 DIAGNOSIS — N184 Chronic kidney disease, stage 4 (severe): Secondary | ICD-10-CM | POA: Diagnosis not present

## 2022-11-19 DIAGNOSIS — M5416 Radiculopathy, lumbar region: Secondary | ICD-10-CM | POA: Diagnosis not present

## 2022-11-20 DIAGNOSIS — N184 Chronic kidney disease, stage 4 (severe): Secondary | ICD-10-CM | POA: Diagnosis not present

## 2022-11-20 DIAGNOSIS — N2581 Secondary hyperparathyroidism of renal origin: Secondary | ICD-10-CM | POA: Diagnosis not present

## 2022-11-20 DIAGNOSIS — E875 Hyperkalemia: Secondary | ICD-10-CM | POA: Diagnosis not present

## 2022-11-20 DIAGNOSIS — E1122 Type 2 diabetes mellitus with diabetic chronic kidney disease: Secondary | ICD-10-CM | POA: Diagnosis not present

## 2022-11-20 DIAGNOSIS — D472 Monoclonal gammopathy: Secondary | ICD-10-CM | POA: Diagnosis not present

## 2022-11-23 ENCOUNTER — Ambulatory Visit: Payer: No Typology Code available for payment source | Admitting: Certified Registered"

## 2022-11-23 ENCOUNTER — Encounter
Admission: RE | Disposition: A | Discharge status: Home / Self Care | Payer: Self-pay | Source: Home / Self Care | Attending: Gastroenterology

## 2022-11-23 ENCOUNTER — Ambulatory Visit
Admission: RE | Admit: 2022-11-23 | Discharge: 2022-11-23 | Disposition: A | Discharge status: Home / Self Care | Payer: No Typology Code available for payment source | Attending: Gastroenterology | Admitting: Gastroenterology

## 2022-11-23 DIAGNOSIS — Z794 Long term (current) use of insulin: Secondary | ICD-10-CM | POA: Diagnosis not present

## 2022-11-23 DIAGNOSIS — Z860100 Personal history of colon polyps, unspecified: Secondary | ICD-10-CM

## 2022-11-23 DIAGNOSIS — D123 Benign neoplasm of transverse colon: Secondary | ICD-10-CM | POA: Diagnosis not present

## 2022-11-23 DIAGNOSIS — Z1211 Encounter for screening for malignant neoplasm of colon: Secondary | ICD-10-CM | POA: Insufficient documentation

## 2022-11-23 DIAGNOSIS — E1122 Type 2 diabetes mellitus with diabetic chronic kidney disease: Secondary | ICD-10-CM | POA: Diagnosis not present

## 2022-11-23 DIAGNOSIS — K5730 Diverticulosis of large intestine without perforation or abscess without bleeding: Secondary | ICD-10-CM | POA: Insufficient documentation

## 2022-11-23 DIAGNOSIS — I2510 Atherosclerotic heart disease of native coronary artery without angina pectoris: Secondary | ICD-10-CM | POA: Diagnosis not present

## 2022-11-23 DIAGNOSIS — K635 Polyp of colon: Secondary | ICD-10-CM | POA: Diagnosis not present

## 2022-11-23 DIAGNOSIS — D631 Anemia in chronic kidney disease: Secondary | ICD-10-CM | POA: Diagnosis not present

## 2022-11-23 DIAGNOSIS — D126 Benign neoplasm of colon, unspecified: Secondary | ICD-10-CM

## 2022-11-23 DIAGNOSIS — N184 Chronic kidney disease, stage 4 (severe): Secondary | ICD-10-CM | POA: Diagnosis not present

## 2022-11-23 DIAGNOSIS — I129 Hypertensive chronic kidney disease with stage 1 through stage 4 chronic kidney disease, or unspecified chronic kidney disease: Secondary | ICD-10-CM | POA: Diagnosis not present

## 2022-11-23 HISTORY — PX: POLYPECTOMY: SHX5525

## 2022-11-23 HISTORY — PX: COLONOSCOPY WITH PROPOFOL: SHX5780

## 2022-11-23 LAB — GLUCOSE, CAPILLARY: Glucose-Capillary: 117 mg/dL — ABNORMAL HIGH (ref 70–99)

## 2022-11-23 SURGERY — COLONOSCOPY WITH PROPOFOL
Anesthesia: General

## 2022-11-23 MED ORDER — PROPOFOL 500 MG/50ML IV EMUL
INTRAVENOUS | Status: DC | PRN
  Administered 2022-11-23: 125 ug/kg/min via INTRAVENOUS

## 2022-11-23 MED ORDER — STERILE WATER FOR IRRIGATION IR SOLN
Status: DC | PRN
  Administered 2022-11-23: 60 mL

## 2022-11-23 MED ORDER — PROPOFOL 10 MG/ML IV BOLUS
INTRAVENOUS | Status: DC | PRN
  Administered 2022-11-23 (×2): 30 mg via INTRAVENOUS

## 2022-11-23 MED ORDER — SODIUM CHLORIDE 0.9 % IV SOLN
INTRAVENOUS | Status: DC
  Administered 2022-11-23: 20 mL/h via INTRAVENOUS

## 2022-11-23 NOTE — Anesthesia Postprocedure Evaluation (Signed)
Anesthesia Post Note  Patient: Arionne T Capano  Procedure(s) Performed: COLONOSCOPY WITH PROPOFOL POLYPECTOMY  Patient location during evaluation: Endoscopy Anesthesia Type: General Level of consciousness: awake and alert Pain management: pain level controlled Vital Signs Assessment: post-procedure vital signs reviewed and stable Respiratory status: spontaneous breathing, nonlabored ventilation, respiratory function stable and patient connected to nasal cannula oxygen Cardiovascular status: blood pressure returned to baseline and stable Postop Assessment: no apparent nausea or vomiting Anesthetic complications: no   No notable events documented.   Last Vitals:  Vitals:   11/23/22 0932 11/23/22 0942  BP: 116/66 119/86  Pulse:    Resp:    Temp:    SpO2:  100%    Last Pain:  Vitals:   11/23/22 0942  TempSrc:   PainSc: 0-No pain                 Louie Boston

## 2022-11-23 NOTE — Anesthesia Preprocedure Evaluation (Signed)
Anesthesia Evaluation  Patient identified by MRN, date of birth, ID band Patient awake    Reviewed: Allergy & Precautions, NPO status , Patient's Chart, lab work & pertinent test results  History of Anesthesia Complications Negative for: history of anesthetic complications  Airway Mallampati: III  TM Distance: >3 FB Neck ROM: full    Dental  (+) Upper Dentures   Pulmonary neg pulmonary ROS   Pulmonary exam normal        Cardiovascular hypertension, On Medications + CAD  Normal cardiovascular exam     Neuro/Psych  PSYCHIATRIC DISORDERS  Depression     Neuromuscular disease    GI/Hepatic Neg liver ROS,GERD  Controlled,,  Endo/Other  diabetesHypothyroidism    Renal/GU Renal disease  negative genitourinary   Musculoskeletal   Abdominal   Peds  Hematology  (+) Blood dyscrasia, anemia   Anesthesia Other Findings Past Medical History: No date: Bone spur     Comment:  heel 09/21/2014: Chronic kidney disease, stage III (moderate) (HCC) No date: Depression No date: Diabetes mellitus without complication (HCC) No date: Gout 06/21/2021: 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) 05/04/2021: Need for tetanus booster 07/13/2021: Stage 3b chronic kidney disease (HCC)  Past Surgical History: 04/1970: ABDOMINAL HYSTERECTOMY     Comment:  partial late 1990's: CHOLECYSTECTOMY 10/09/2021: COLONOSCOPY WITH PROPOFOL; N/A     Comment:  Procedure: COLONOSCOPY WITH PROPOFOL;  Surgeon: Wyline Mood, MD;  Location: [facility] ENDOSCOPY;  Service:               Gastroenterology;  Laterality: N/A; 03/14/2022: LAPAROSCOPIC APPENDECTOMY; N/A     Comment:  Procedure: APPENDECTOMY LAPAROSCOPIC;  Surgeon: Henrene Dodge, MD;  Location: [facility] ORS;  Service: General;                Laterality: N/A;  BMI    Body Mass Index: 34.37 kg/m       Reproductive/Obstetrics negative OB ROS                             Anesthesia Physical Anesthesia Plan  ASA: 3  Anesthesia Plan: General   Post-op Pain Management: Minimal or no pain anticipated   Induction: Intravenous  PONV Risk Score and Plan: 2 and Propofol infusion and TIVA  Airway Management Planned: Natural Airway and Nasal Cannula  Additional Equipment:   Intra-op Plan:   Post-operative Plan:   Informed Consent: I have reviewed the patients History and Physical, chart, labs and discussed the procedure including the risks, benefits and alternatives for the proposed anesthesia with the patient or authorized representative who has indicated his/her understanding and acceptance.     Dental Advisory Given  Plan Discussed with: Anesthesiologist, CRNA and Surgeon  Anesthesia Plan Comments: (Patient consented for risks of anesthesia including but not limited to:  - adverse reactions to medications - risk of airway placement if required - damage to eyes, teeth, lips or other oral mucosa - nerve damage due to positioning  - sore throat or hoarseness - Damage to heart, brain, nerves, lungs, other parts of body or loss of life  Patient voiced understanding.)       Anesthesia Quick Evaluation

## 2022-11-23 NOTE — Anesthesia Procedure Notes (Signed)
Procedure Name: MAC Date/Time: 11/23/2022 8:51 AM  Performed by: Nelle Don, CRNAPre-anesthesia Checklist: Patient identified, Emergency Drugs available, Suction available and Patient being monitored Oxygen Delivery Method: Simple face mask

## 2022-11-23 NOTE — Transfer of Care (Signed)
Immediate Anesthesia Transfer of Care Note  Patient: Donna Malone  Procedure(s) Performed: COLONOSCOPY WITH PROPOFOL POLYPECTOMY  Patient Location: PACU  Anesthesia Type:General  Level of Consciousness: awake, alert , and oriented  Airway & Oxygen Therapy: Patient Spontanous Breathing  Post-op Assessment: Report given to RN and Post -op Vital signs reviewed and stable  Post vital signs: Reviewed and stable  Last Vitals:  Vitals Value Taken Time  BP 97/63 11/23/22 0922  Temp 36 C 11/23/22 0922  Pulse 62 11/23/22 0924  Resp 15 11/23/22 0924  SpO2 94 % 11/23/22 0924  Vitals shown include unfiled device data.  Last Pain:  Vitals:   11/23/22 0922  TempSrc: Temporal  PainSc:          Complications: No notable events documented.

## 2022-11-23 NOTE — H&P (Signed)
Wyline Mood, MD [address], [address], [place], [place], [address] [address], [place], [place], [address] Phone: [phone]  Fax: [phone]  Primary Care Physician:  Sherlyn Hay, DO   Pre-Procedure History & Physical: HPI:  Donna Malone is a 77 y.o. female is here for an colonoscopy.   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   Need for tetanus booster 05/04/2021   Stage 3b chronic kidney disease (HCC) 07/13/2021    Past Surgical History:  Procedure Laterality Date   ABDOMINAL HYSTERECTOMY  04/1970   partial   CHOLECYSTECTOMY  late 1990's   COLONOSCOPY WITH PROPOFOL N/A 10/09/2021   Procedure: COLONOSCOPY WITH PROPOFOL;  Surgeon: Wyline Mood, MD;  Location: [facility] ENDOSCOPY;  Service: Gastroenterology;  Laterality: N/A;   LAPAROSCOPIC APPENDECTOMY N/A 03/14/2022   Procedure: APPENDECTOMY LAPAROSCOPIC;  Surgeon: Henrene Dodge, MD;  Location: [facility] ORS;  Service: General;  Laterality: N/A;    Prior to Admission medications   Medication Sig Start Date End Date Taking? Authorizing Provider  Accu-Chek Softclix Lancets lancets Use as instructed 01/17/22  Yes [name], [name]  acetaminophen (TYLENOL) 500 MG tablet Take 500 mg by mouth every 6 (six) hours as needed for mild pain or headache.   Yes [provider]  allopurinol (ZYLOPRIM) 100 MG tablet TAKE 1 TABLET BY MOUTH EVERY DAY 07/25/22  Yes [name], [name]  atorvastatin (LIPITOR) 40 MG tablet TAKE 1 TABLET BY MOUTH EVERY DAY 07/25/22  Yes [name], [name]  B Complex Vitamins (GNP VITAMIN B COMPLEX PO) Take 1 tablet by mouth daily.   Yes [provider]  blood glucose meter kit and supplies Dispense based on patient  and insurance preference. Use once daily for FBG. (FOR ICD-10 E10.9, E11.9). 11/01/21  Yes [name], [name]  Blood Glucose Monitoring Suppl DEVI 1 each by Does not apply route in the morning, at noon, and at bedtime. May substitute to any manufacturer covered by patient's insurance. 07/26/22  Yes [name], [name]  ferrous sulfate 325 (65 FE) MG EC tablet Take 325 mg by mouth daily.   Yes [provider]  glucose blood (ACCU-CHEK GUIDE) test strip Use as instructed 01/17/22  Yes [name], [name]  KRILL OIL PO Take by mouth.   Yes [provider]  levothyroxine (SYNTHROID) 75 MCG tablet Take 1 tablet (75 mcg total) by mouth daily before breakfast. 07/02/22  Yes [name], [name]  MAGNESIUM-OXIDE PO Take by mouth.   Yes [provider]  meclizine (ANTIVERT) 25 MG tablet Take 1-2 tablets (25-50 mg total) by mouth 3 (three) times daily as needed for dizziness. 11/29/21  Yes [name], [name]  Multiple Vitamins-Minerals (PRESERVISION AREDS 2) CAPS Take 1 capsule by mouth in the morning and at bedtime.   Yes [provider]  ONETOUCH VERIO test strip USE IN THE MORNING, AT NOON, AND AT BEDTIME. 10/23/22  Yes [name], [name]  Polyethyl Glycol-Propyl Glycol (SYSTANE HYDRATION PF OP) Place 1 drop into both eyes daily as needed (for dry eyes).   Yes [provider]  tirzepatide Greggory Keen) 2.5 MG/0.5ML Pen Inject 2.5 mg into the skin once a week  for 28 days. 11/06/22 12/05/22 Yes Bacigalupo, Marzella Schlein, MD  traZODone (DESYREL) 50 MG tablet TAKE 1 TABLET BY MOUTH EVERYDAY AT BEDTIME 07/02/22  Yes [name], [name]  Vitamin D, Ergocalciferol, (DRISDOL) 1.25 MG (50000 UNIT) CAPS capsule TAKE 1 CAPSULE (50,000 UNITS TOTAL) BY MOUTH EVERY 7 (SEVEN) DAYS. SUNDAY 07/25/22  Yes [name], [name]  escitalopram (LEXAPRO) 20 MG tablet TAKE 1 TABLET BY MOUTH EVERY DAY Patient not taking: Reported on 11/16/2022 10/22/22   [name], [name]  famotidine (PEPCID) 40 MG tablet  TAKE 1 TABLET BY MOUTH EVERY DAY Patient not taking: Reported on 11/23/2022 06/11/22   [name], [name]  methocarbamol (ROBAXIN) 500 MG tablet Take 500 mg by mouth 4 (four) times daily. Patient not taking: Reported on 11/16/2022    [provider]    Allergies as of 09/19/2022 - Review Complete 09/19/2022  Allergen Reaction Noted   Other Other (See Comments) 09/19/2022   Sulfa antibiotics Other (See Comments) 09/21/2014    Family History  Problem Relation Age of Onset   CAD Mother    Heart attack Mother    Heart disease Father    Diabetes Cousin    Cancer Cousin     Social History   Socioeconomic History   Marital status: Widowed    Spouse name: Danne Harbor   Number of children: 3   Years of education: Not on file   Highest education level: 12th grade  Occupational History   Occupation: retired  Tobacco Use   Smoking status: Never   Smokeless tobacco: Never  Vaping Use   Vaping status: Never Used  Substance and Sexual Activity   Alcohol use: No   Drug use: No   Sexual activity: Not Currently  Other Topics Concern   Not on file  Social History Narrative   Not on file   Social Determinants of Health   Financial Resource Strain: Low Risk  (04/30/2022)   Overall Financial Resource Strain (CARDIA)    Difficulty of Paying Living Expenses: Not hard at all  Food Insecurity: No Food Insecurity (04/30/2022)   Hunger Vital Sign    Worried About Running Out of Food in the Last Year: Never true    Ran Out of Food in the Last Year: Never true  Transportation Needs: No Transportation Needs (04/30/2022)   PRAPARE - Administrator, Civil Service (Medical): No    Lack of Transportation (Non-Medical): No  Physical Activity: Inactive (04/30/2022)   Exercise Vital Sign    Days of Exercise per Week: 0 days    Minutes of Exercise per Session: 0 min  Stress: No Stress Concern Present (04/30/2022)   [employer] of Occupational Health - Occupational Stress  Questionnaire    Feeling of Stress : Not at all  Social Connections: Moderately Isolated (04/30/2022)   Social Connection and Isolation Panel [NHANES]    Frequency of Communication with Friends and Family: More than three times a week    Frequency of Social Gatherings with Friends and Family: Twice a week    Attends Religious Services: More than 4 times per year    Active Member of [employer] or Organizations: No    Attends Banker Meetings: Never    Marital Status: Widowed  Intimate Partner Violence: Not At Risk (04/30/2022)   Humiliation, Afraid, Rape, and Kick questionnaire    Fear of Current or Ex-Partner: No    Emotionally Abused: No    Physically Abused: No  Sexually Abused: No    Review of Systems: See HPI, otherwise negative ROS  Physical Exam: BP 133/76   Pulse 89   Temp (!) 96.4 F (35.8 C) (Temporal)   Resp 18   Ht 5\' 3"  (1.6 m)   Wt 88 kg   SpO2 100%   BMI 34.37 kg/m  General:   Alert,  pleasant and cooperative in NAD Head:  Normocephalic and atraumatic. Neck:  Supple; no masses or thyromegaly. Lungs:  Clear throughout to auscultation, normal respiratory effort.    Heart:  +S1, +S2, Regular rate and rhythm, No edema. Abdomen:  Soft, nontender and nondistended. Normal bowel sounds, without guarding, and without rebound.   Neurologic:  Alert and  oriented x4;  grossly normal neurologically.  Impression/Plan: Donna Malone is here for an colonoscopy to be performed for Screening colonoscopy average risk   Risks, benefits, limitations, and alternatives regarding  colonoscopy have been reviewed with the patient.  Questions have been answered.  All parties agreeable.   Wyline Mood, MD  11/23/2022, 8:17 AM

## 2022-11-23 NOTE — Op Note (Signed)
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 Procedure:             Pre-Anesthesia Assessment:                        - Prior to the procedure, a History and Physical was                         performed, and patient medications, allergies and                         sensitivities were reviewed. The patient's tolerance                         of previous anesthesia was reviewed.                        - The risks and benefits of the procedure and the                         sedation options and risks were discussed with the                         patient. All questions were answered and informed                         consent was obtained.                        - ASA Grade Assessment: II - A patient with mild                         systemic disease.                        After obtaining informed consent, the colonoscope was                         passed under direct vision. Throughout the procedure,                         the patient's blood pressure, pulse, and oxygen  saturations were monitored continuously. The                         Colonoscope was introduced through the anus and                         advanced to the the cecum, identified by the                         appendiceal orifice. The colonoscopy was performed                          with ease. The patient tolerated the procedure well.                         The quality of the bowel preparation was excellent.                         The ileocecal valve, appendiceal orifice, and rectum                         were photographed. Findings:      The perianal and digital rectal examinations were normal.      A 5 mm polyp was found in the transverse colon. The polyp was sessile.       The polyp was removed with a cold snare. Resection and retrieval were       complete.      Multiple medium-mouthed diverticula were found in the sigmoid colon.      The exam was otherwise without abnormality on direct and retroflexion       views. Impression:            - One 5 mm polyp in the transverse colon, removed with                         a cold snare. Resected and retrieved.                        - Diverticulosis in the sigmoid colon.                        - The examination was otherwise normal on direct and                         retroflexion views. Recommendation:        - Discharge patient to home (with escort).                        - Resume previous diet.                        - Continue present medications.                        - Await pathology results.                        - Repeat colonoscopy is not recommended due to current  age (76 years or older) for surveillance. Procedure Code(s):     --- Professional ---                        [phone], Colonoscopy, flexible; with removal of                         tumor(s), polyp(s), or other lesion(s) by snare                         technique Diagnosis Code(s):     --- Professional ---                        Z86.010, Personal history of colonic polyps                        D12.3, Benign neoplasm of transverse colon (hepatic                         flexure or splenic flexure)                        K57.30, Diverticulosis of large intestine without                         perforation or  abscess without bleeding CPT copyright 2022 American Medical Association. All rights reserved. The codes documented in this report are preliminary and upon coder review may  be revised to meet current compliance requirements. Wyline Mood, MD Wyline Mood MD, MD 11/23/2022 9:24:10 AM This report has been signed electronically. Number of Addenda: 0 Note Initiated On: 11/23/2022 8:45 AM Scope Withdrawal Time: 0 hours 15 minutes 0 seconds  Total Procedure Duration: 0 hours 21 minutes 44 seconds  Estimated Blood Loss:  Estimated blood loss: none.      [facility]

## 2022-11-24 ENCOUNTER — Other Ambulatory Visit: Payer: Self-pay | Admitting: Family Medicine

## 2022-11-24 DIAGNOSIS — E038 Other specified hypothyroidism: Secondary | ICD-10-CM

## 2022-11-24 DIAGNOSIS — E7800 Pure hypercholesterolemia, unspecified: Secondary | ICD-10-CM

## 2022-11-24 DIAGNOSIS — M109 Gout, unspecified: Secondary | ICD-10-CM

## 2022-11-26 ENCOUNTER — Encounter: Payer: Self-pay | Admitting: Gastroenterology

## 2022-11-26 DIAGNOSIS — M9905 Segmental and somatic dysfunction of pelvic region: Secondary | ICD-10-CM | POA: Diagnosis not present

## 2022-11-26 DIAGNOSIS — M5136 Other intervertebral disc degeneration, lumbar region: Secondary | ICD-10-CM | POA: Diagnosis not present

## 2022-11-26 DIAGNOSIS — M9903 Segmental and somatic dysfunction of lumbar region: Secondary | ICD-10-CM | POA: Diagnosis not present

## 2022-11-26 DIAGNOSIS — M5416 Radiculopathy, lumbar region: Secondary | ICD-10-CM | POA: Diagnosis not present

## 2022-11-26 NOTE — Telephone Encounter (Signed)
Requests are too soon, last refill 07/02/22 for 90 and 1 refill.  Requested Prescriptions  Pending Prescriptions Disp Refills   atorvastatin (LIPITOR) 40 MG tablet [Pharmacy Med Name: ATORVASTATIN 40 MG TABLET] 90 tablet 1    Sig: TAKE 1 TABLET BY MOUTH EVERY DAY     Cardiovascular:  Antilipid - Statins Failed - 11/24/2022  6:47 PM      Failed - Lipid Panel in normal range within the last 12 months    Cholesterol, Total  Date Value Ref Range Status  11/01/2022 191 100 - 199 mg/dL Final   LDL Chol Calc (NIH)  Date Value Ref Range Status  11/01/2022 89 0 - 99 mg/dL Final   HDL  Date Value Ref Range Status  11/01/2022 33 (L) >39 mg/dL Final   Triglycerides  Date Value Ref Range Status  11/01/2022 417 (H) 0 - 149 mg/dL Final         Passed - Patient is not pregnant      Passed - Valid encounter within last 12 months    Recent Outpatient Visits           3 weeks ago Right hip pain   [facility] [place], [name], DO   2 months ago Right hip pain   [place] [facility] [name], PA-C   2 months ago Right lower quadrant abdominal pain   [place] [facility] [name], PA-C   3 months ago Right lower quadrant abdominal pain   [place] [facility] [name], PA-C   6 months ago Annual physical exam   [facility] [name], [name]       Future Appointments             In 5 months [name], [name], DO [facility]             allopurinol (ZYLOPRIM) 100 MG tablet [Pharmacy Med Name: ALLOPURINOL 100 MG TABLET] 90 tablet 1    Sig: TAKE 1 TABLET BY MOUTH EVERY DAY     Endocrinology:  Gout Agents - allopurinol Failed - 11/24/2022  6:47 PM      Failed - Uric Acid in normal range and within 360 days    Uric Acid, Serum  Date Value Ref Range Status  10/29/2015 11.3 (H) 2.3 - 6.6 mg/dL Final         Failed - Cr in  normal range and within 360 days    Creatinine  Date Value Ref Range Status  08/23/2022 2.72 (H) 0.44 - 1.00 mg/dL Final   Creatinine, Ser  Date Value Ref Range Status  11/01/2022 2.32 (H) 0.57 - 1.00 mg/dL Final   Creatinine, Urine  Date Value Ref Range Status  03/09/2022 130 mg/dL Final         Passed - Valid encounter within last 12 months    Recent Outpatient Visits           3 weeks ago Right hip pain   [facility] Sherlyn Hay, DO   2 months ago Right hip pain   [facility] [name], PA-C   2 months ago Right lower quadrant abdominal pain   [facility] [name], PA-C   3 months ago Right lower quadrant abdominal pain    [facility] [name], PA-C   6 months ago Annual physical  exam   [facility] [name], [name]       Future Appointments             In 5 months [name], [name], DO [place] [facility]            Passed - CBC within normal limits and completed in the last 12 months    WBC  Date Value Ref Range Status  11/01/2022 11.9 (H) 3.4 - 10.8 x10E3/uL Final  05/18/2022 7.7 4.0 - 10.5 K/uL Final   WBC Count  Date Value Ref Range Status  08/23/2022 8.5 4.0 - 10.5 K/uL Final   RBC  Date Value Ref Range Status  11/01/2022 3.51 (L) 3.77 - 5.28 x10E6/uL Final  08/23/2022 3.48 (L) 3.87 - 5.11 MIL/uL Final   Hemoglobin  Date Value Ref Range Status  11/01/2022 12.1 11.1 - 15.9 g/dL Final   Hematocrit  Date Value Ref Range Status  11/01/2022 34.9 34.0 - 46.6 % Final   MCHC  Date Value Ref Range Status  11/01/2022 34.7 31.5 - 35.7 g/dL Final  16/01/9603 54.0 30.0 - 36.0 g/dL Final   [facility]  Date Value Ref Range Status  11/01/2022 34.5 (H) 26.6 - 33.0 pg Final  08/23/2022 33.9 26.0 - 34.0 pg Final   MCV  Date Value Ref Range Status  11/01/2022 99 (H) 79 - 97 fL  Final   No results found for: "PLTCOUNTKUC", "LABPLAT", "POCPLA" RDW  Date Value Ref Range Status  11/01/2022 13.1 11.7 - 15.4 % Final          levothyroxine (SYNTHROID) 75 MCG tablet [Pharmacy Med Name: LEVOTHYROXINE 75 MCG TABLET] 90 tablet 1    Sig: TAKE 1 TABLET BY MOUTH DAILY BEFORE BREAKFAST.     Endocrinology:  Hypothyroid Agents Passed - 11/24/2022  6:47 PM      Passed - TSH in normal range and within 360 days    TSH  Date Value Ref Range Status  11/01/2022 2.130 0.450 - 4.500 uIU/mL Final         Passed - Valid encounter within last 12 months    Recent Outpatient Visits           3 weeks ago Right hip pain   [facility] [place], [name], DO   2 months ago Right hip pain   [place] [facility] [name], PA-C   2 months ago Right lower quadrant abdominal pain   [place] [facility] [name], PA-C   3 months ago Right lower quadrant abdominal pain   [place] [facility] [name], PA-C   6 months ago Annual physical exam   [facility] [name], [name]       Future Appointments             In 5 months [name], [name], DO [facility]

## 2022-12-09 ENCOUNTER — Other Ambulatory Visit: Payer: Self-pay | Admitting: Family Medicine

## 2022-12-09 DIAGNOSIS — E559 Vitamin D deficiency, unspecified: Secondary | ICD-10-CM

## 2022-12-10 ENCOUNTER — Other Ambulatory Visit: Payer: Self-pay | Admitting: Family Medicine

## 2022-12-10 DIAGNOSIS — N184 Chronic kidney disease, stage 4 (severe): Secondary | ICD-10-CM | POA: Diagnosis not present

## 2022-12-10 DIAGNOSIS — C9000 Multiple myeloma not having achieved remission: Secondary | ICD-10-CM | POA: Diagnosis not present

## 2022-12-10 DIAGNOSIS — M5416 Radiculopathy, lumbar region: Secondary | ICD-10-CM | POA: Diagnosis not present

## 2022-12-10 DIAGNOSIS — M9903 Segmental and somatic dysfunction of lumbar region: Secondary | ICD-10-CM | POA: Diagnosis not present

## 2022-12-10 DIAGNOSIS — M9905 Segmental and somatic dysfunction of pelvic region: Secondary | ICD-10-CM | POA: Diagnosis not present

## 2022-12-10 DIAGNOSIS — M5136 Other intervertebral disc degeneration, lumbar region: Secondary | ICD-10-CM | POA: Diagnosis not present

## 2022-12-10 DIAGNOSIS — I2510 Atherosclerotic heart disease of native coronary artery without angina pectoris: Secondary | ICD-10-CM

## 2022-12-10 DIAGNOSIS — E0822 Diabetes mellitus due to underlying condition with diabetic chronic kidney disease: Secondary | ICD-10-CM

## 2022-12-10 NOTE — Telephone Encounter (Signed)
Medication Refill - Medication: tirzepatide Greggory Keen) 2.5 MG/0.5ML Pen   Has the patient contacted their pharmacy? No/pt thought she had to call in. I let her know she can do this in the future  Preferred Pharmacy (with phone number or street name): [facility] Phone: [phone]  Fax: [phone]   Has the patient been seen for an appointment in the last year OR does the patient have an upcoming appointment? yes  Agent: Please be advised that RX refills may take up to 3 business days. We ask that you follow-up with your pharmacy.

## 2022-12-11 ENCOUNTER — Inpatient Hospital Stay: Payer: No Typology Code available for payment source | Attending: Oncology

## 2022-12-11 ENCOUNTER — Other Ambulatory Visit: Payer: Self-pay

## 2022-12-11 ENCOUNTER — Other Ambulatory Visit: Payer: Self-pay | Admitting: Family Medicine

## 2022-12-11 DIAGNOSIS — I2510 Atherosclerotic heart disease of native coronary artery without angina pectoris: Secondary | ICD-10-CM

## 2022-12-11 DIAGNOSIS — C9000 Multiple myeloma not having achieved remission: Secondary | ICD-10-CM | POA: Diagnosis not present

## 2022-12-11 DIAGNOSIS — E0822 Diabetes mellitus due to underlying condition with diabetic chronic kidney disease: Secondary | ICD-10-CM

## 2022-12-11 DIAGNOSIS — D72820 Lymphocytosis (symptomatic): Secondary | ICD-10-CM

## 2022-12-11 LAB — CBC WITH DIFFERENTIAL (CANCER CENTER ONLY)
Abs Immature Granulocytes: 0.03 10*3/uL (ref 0.00–0.07)
Basophils Absolute: 0.1 10*3/uL (ref 0.0–0.1)
Basophils Relative: 1 %
Eosinophils Absolute: 0.4 10*3/uL (ref 0.0–0.5)
Eosinophils Relative: 4 %
HCT: 35.6 % — ABNORMAL LOW (ref 36.0–46.0)
Hemoglobin: 11.8 g/dL — ABNORMAL LOW (ref 12.0–15.0)
Immature Granulocytes: 0 %
Lymphocytes Relative: 30 %
Lymphs Abs: 2.8 10*3/uL (ref 0.7–4.0)
MCH: 33.8 pg (ref 26.0–34.0)
MCHC: 33.1 g/dL (ref 30.0–36.0)
MCV: 102 fL — ABNORMAL HIGH (ref 80.0–100.0)
Monocytes Absolute: 0.7 10*3/uL (ref 0.1–1.0)
Monocytes Relative: 8 %
Neutro Abs: 5.3 10*3/uL (ref 1.7–7.7)
Neutrophils Relative %: 57 %
Platelet Count: 244 10*3/uL (ref 150–400)
RBC: 3.49 MIL/uL — ABNORMAL LOW (ref 3.87–5.11)
RDW: 12.7 % (ref 11.5–15.5)
WBC Count: 9.3 10*3/uL (ref 4.0–10.5)
nRBC: 0 % (ref 0.0–0.2)

## 2022-12-11 LAB — CMP (CANCER CENTER ONLY)
ALT: 22 U/L (ref 0–44)
AST: 22 U/L (ref 15–41)
Albumin: 3.6 g/dL (ref 3.5–5.0)
Alkaline Phosphatase: 100 U/L (ref 38–126)
Anion gap: 10 (ref 5–15)
BUN: 34 mg/dL — ABNORMAL HIGH (ref 8–23)
CO2: 21 mmol/L — ABNORMAL LOW (ref 22–32)
Calcium: 9.2 mg/dL (ref 8.9–10.3)
Chloride: 106 mmol/L (ref 98–111)
Creatinine: 1.94 mg/dL — ABNORMAL HIGH (ref 0.44–1.00)
GFR, Estimated: 26 mL/min — ABNORMAL LOW (ref >60)
Glucose, Bld: 135 mg/dL — ABNORMAL HIGH (ref 70–99)
Potassium: 4.2 mmol/L (ref 3.5–5.1)
Sodium: 137 mmol/L (ref 135–145)
Total Bilirubin: 0.4 mg/dL (ref 0.3–1.2)
Total Protein: 6.8 g/dL (ref 6.5–8.1)

## 2022-12-11 NOTE — Telephone Encounter (Signed)
Requested medications are due for refill today.  unsure  Requested medications are on the active medications list.  yes  Last refill. 11/06/2022 2mL 0 rf  Future visit scheduled.   yes  Notes to clinic.  Rx written to expire 12/05/2022 Rx is expired. Please review for refill.    Requested Prescriptions  Pending Prescriptions Disp Refills   tirzepatide (MOUNJARO) 2.5 MG/0.5ML Pen 2 mL 0    Sig: Inject 2.5 mg into the skin once a week for 28 days.     Off-Protocol Failed - 12/10/2022  1:36 PM      Failed - Medication not assigned to a protocol, review manually.      Passed - Valid encounter within last 12 months    Recent Outpatient Visits           1 month ago Right hip pain   [facility] [place], [name], DO   2 months ago Right hip pain   [place] [facility] [name], PA-C   3 months ago Right lower quadrant abdominal pain   [place] [facility] [name], PA-C   3 months ago Right lower quadrant abdominal pain   [facility] [name], PA-C   7 months ago Annual physical exam   [facility] [name], [name]       Future Appointments             In 4 months [name], [name], DO [place] [facility]

## 2022-12-12 ENCOUNTER — Other Ambulatory Visit: Payer: Self-pay

## 2022-12-12 DIAGNOSIS — Z008 Encounter for other general examination: Secondary | ICD-10-CM | POA: Diagnosis not present

## 2022-12-12 DIAGNOSIS — I700 Atherosclerosis of aorta: Secondary | ICD-10-CM | POA: Diagnosis not present

## 2022-12-12 DIAGNOSIS — Z6834 Body mass index (BMI) 34.0-34.9, adult: Secondary | ICD-10-CM | POA: Diagnosis not present

## 2022-12-12 DIAGNOSIS — E669 Obesity, unspecified: Secondary | ICD-10-CM | POA: Diagnosis not present

## 2022-12-12 DIAGNOSIS — F3341 Major depressive disorder, recurrent, in partial remission: Secondary | ICD-10-CM | POA: Diagnosis not present

## 2022-12-12 LAB — KAPPA/LAMBDA LIGHT CHAINS
Kappa free light chain: 190.2 mg/L — ABNORMAL HIGH (ref 3.3–19.4)
Kappa, lambda light chain ratio: 6.38 — ABNORMAL HIGH (ref 0.26–1.65)
Lambda free light chains: 29.8 mg/L — ABNORMAL HIGH (ref 5.7–26.3)

## 2022-12-12 MED ORDER — TIRZEPATIDE 2.5 MG/0.5ML ~~LOC~~ SOAJ
2.5000 mg | SUBCUTANEOUS | 0 refills | Status: DC
  Filled 2022-12-12: qty 2, 28d supply, fill #0

## 2022-12-13 ENCOUNTER — Other Ambulatory Visit: Payer: Self-pay

## 2022-12-13 LAB — COMP PANEL: LEUKEMIA/LYMPHOMA: Immunophenotypic Profile: 0

## 2022-12-14 LAB — MULTIPLE MYELOMA PANEL, SERUM
Albumin SerPl Elph-Mcnc: 3.2 g/dL (ref 2.9–4.4)
Albumin/Glob SerPl: 1.1 (ref 0.7–1.7)
Alpha 1: 0.2 g/dL (ref 0.0–0.4)
Alpha2 Glob SerPl Elph-Mcnc: 1.2 g/dL — ABNORMAL HIGH (ref 0.4–1.0)
B-Globulin SerPl Elph-Mcnc: 1.1 g/dL (ref 0.7–1.3)
Gamma Glob SerPl Elph-Mcnc: 0.5 g/dL (ref 0.4–1.8)
Globulin, Total: 3 g/dL (ref 2.2–3.9)
IgA: 635 mg/dL — ABNORMAL HIGH (ref 64–422)
IgG (Immunoglobin G), Serum: 483 mg/dL — ABNORMAL LOW (ref 586–1602)
IgM (Immunoglobulin M), Srm: 118 mg/dL (ref 26–217)
Total Protein ELP: 6.2 g/dL (ref 6.0–8.5)

## 2022-12-14 LAB — IFE+PROTEIN ELECTRO, 24-HR UR
% BETA, Urine: 49.4 %
ALPHA 1 URINE: 3.8 %
Albumin, U: 31.4 %
Alpha 2, Urine: 4.5 %
GAMMA GLOBULIN URINE: 10.9 %
M-SPIKE %, Urine: 25.9 % — ABNORMAL HIGH
M-Spike, Mg/24 Hr: 107 mg/(24.h) — ABNORMAL HIGH
Total Protein, Urine-Ur/day: 414 mg/(24.h) — ABNORMAL HIGH (ref 30–150)
Total Protein, Urine: 17.6 mg/dL
Total Volume: 2350

## 2022-12-21 ENCOUNTER — Encounter: Payer: Self-pay | Admitting: Oncology

## 2022-12-21 ENCOUNTER — Inpatient Hospital Stay (HOSPITAL_BASED_OUTPATIENT_CLINIC_OR_DEPARTMENT_OTHER): Payer: No Typology Code available for payment source | Admitting: Oncology

## 2022-12-21 VITALS — BP 130/78 | HR 90 | Temp 96.7°F | Resp 18 | Wt 195.8 lb

## 2022-12-21 DIAGNOSIS — N184 Chronic kidney disease, stage 4 (severe): Secondary | ICD-10-CM | POA: Diagnosis not present

## 2022-12-21 DIAGNOSIS — C9000 Multiple myeloma not having achieved remission: Secondary | ICD-10-CM | POA: Diagnosis not present

## 2022-12-21 DIAGNOSIS — D72820 Lymphocytosis (symptomatic): Secondary | ICD-10-CM | POA: Diagnosis not present

## 2022-12-21 NOTE — Assessment & Plan Note (Signed)
Minute clone of B cells. 1 % cells <5000 per ul. Continue observation.  Stable and normal total WBC and lymphocyte count.

## 2022-12-21 NOTE — Progress Notes (Signed)
Pt here for follow up. Reports that energy level is low.

## 2022-12-21 NOTE — Assessment & Plan Note (Signed)
Encourage oral hydration avoid nephrotoxins.  Continue follow-up with nephrology.

## 2022-12-21 NOTE — Progress Notes (Signed)
Hematology/Oncology Progress note [phone] [phone]      CHIEF COMPLAINTS/REASON FOR VISIT:  Smoldering Multiple myeloma, monoclonal lymphocytosis.   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  M protein on UPEP has decreased.  Patient has no  hypercalcemia, Hb>10, no bone lesions on x-ray skeletal survey as well as PET scan.  Repeat skeletal survey, Patient's kidney function is worse. It is difficult to distinguish if her renal function impairment is due to DM or light chain kidney disease. Kidney biopsy will help to clarify and she declined.  Recommend her to follow up with nephrology. If renal function is progressively worse, can consider empirically try MM treatment to see if that helps to improve renal function,   Labs reviewed and discussed with patient, stable kidney function.  I recommend continue observation.follow up in 3 months.   Stage 4 chronic kidney disease (HCC) Encourage oral hydration avoid nephrotoxins.   Continue follow-up with nephrology.  Monoclonal B-cell lymphocytosis of unknown significance Minute clone of B cells. 1 % cells <5000 per ul. Continue observation.  Stable and normal total WBC and lymphocyte count.    Orders Placed This Encounter  Procedures   CMP (Cancer Center only)    Standing Status:   Future    Standing Expiration Date:   12/21/2023   CBC with Differential (Cancer Center Only)    Standing Status:   Future    Standing Expiration Date:   12/21/2023   Kappa/lambda light chains    Standing Status:    Future    Standing Expiration Date:   12/21/2023   Multiple Myeloma Panel (SPEP&IFE w/QIG)    Standing Status:   Future    Standing Expiration Date:   12/21/2023   IFE+PROTEIN ELECTRO, 24-HR UR    Standing Status:   Future    Standing Expiration Date:   12/21/2023   Follow-up in 3 months. All questions were answered. The patient knows to call the clinic with any problems, questions or concerns.  Rickard Patience, Donna Malone, Donna [facility] Hematology Oncology 12/21/2022     HISTORY OF PRESENTING ILLNESS:  Donna SURGEON is a  77 y.o.  female with PMH listed below who was referred to me for evaluation of leukocytosis Reviewed patient' recent labs obtained by PCP.   10/22/2019 CBC showed elevated white count of 11.3, predominantly lymphocytosis.  Normal hemoglobin and platelet count. Previous lab records reviewed. Leukocytosis onset of chronic, duration is since January 2021.   No aggravating or elevated factors. Associated symptoms or signs:  Denies weight loss, fever, chills,night sweats.  Endorses fatigue Smoking history: Never smoker History of recent oral steroid use or steroid injection: No recent steroid injections. History of recent infection: Denies Autoimmune disease history.  Denies  Patient denies any chronic wound, prosthesis.  She has chronic knee arthritis.  #  flow cytometry showed CD5+ , CD23 + B-cell population.  CLL/SLL phenotype.  CD38 negative, <1% leukocytes,<5000 cells/ul # 06/29/2021, bone marrow biopsy showed hyper cellular marrow with plasma cell neoplasm, 10 to 15% plasma cell infiltrates, predominant.  Cytogenetics is normal.  Multiple myeloma FISH panel normal.  Standard risk.  Patient takes Lasix  for lower extremity swelling. INTERVAL HISTORY Chaselyn LATISSA KLINGENSMITH is a 77 y.o. female who has above history reviewed by me today presents for follow up smothering multiple myeloma. She feels well today. No new complains.  09/07/2022 US renal ultrasound previously showed  Mild bilateral renal atrophy as well as increased echogenicity of renal parenchyma  Review of Systems  Constitutional:  Positive for fatigue. Negative for appetite change, chills and fever.  HENT:   Negative for hearing loss and voice change.   Eyes:  Negative for eye problems.  Respiratory:  Negative for chest tightness and cough.   Cardiovascular:  Negative for chest pain.  Gastrointestinal:  Negative for abdominal distention, abdominal pain and blood in stool.  Endocrine: Negative for hot flashes.  Genitourinary:  Negative for difficulty urinating and frequency.   Musculoskeletal:  Positive for arthralgias.  Skin:  Negative for itching and rash.  Neurological:  Negative for extremity weakness.  Hematological:  Negative for adenopathy.  Psychiatric/Behavioral:  Negative for confusion.      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    SURGICAL HISTORY: Past Surgical History:  Procedure Laterality Date   ABDOMINAL HYSTERECTOMY  04/1970   partial   CHOLECYSTECTOMY  late 1990's   COLONOSCOPY WITH PROPOFOL N/A 10/09/2021   Procedure: COLONOSCOPY WITH PROPOFOL;  Surgeon: Wyline Mood, Donna Malone;  Location: [facility] ENDOSCOPY;  Service: Gastroenterology;  Laterality: N/A;   COLONOSCOPY WITH PROPOFOL N/A 11/23/2022   Procedure: COLONOSCOPY WITH PROPOFOL;  Surgeon: Wyline Mood, Donna Malone;  Location: [facility] ENDOSCOPY;  Service: Gastroenterology;  Laterality: N/A;   LAPAROSCOPIC APPENDECTOMY N/A 03/14/2022   Procedure: APPENDECTOMY LAPAROSCOPIC;  Surgeon: Henrene Dodge, Donna Malone;  Location: [facility] ORS;  Service: General;  Laterality: N/A;   POLYPECTOMY   11/23/2022   Procedure: POLYPECTOMY;  Surgeon: Wyline Mood, Donna Malone;  Location: [facility] ENDOSCOPY;  Service: Gastroenterology;;    SOCIAL HISTORY: Social History   Socioeconomic History   Marital status: Widowed    Malone name: Danne Harbor   Number of children: 3   Years of education: Donna on file   Highest education level: 12th grade  Occupational History   Occupation: retired  Tobacco Use   Smoking status: Never   Smokeless tobacco: Never  Vaping Use   Vaping status: Never Used  Substance and Sexual Activity   Alcohol use: No   Drug use: No   Sexual activity: Donna Currently  Other Topics Concern   Donna on file  Social History Narrative   Donna on file   Social Determinants of Health   Financial Resource Strain: Low Risk  (04/30/2022)   Overall Financial Resource Strain (CARDIA)    Difficulty of Paying Living Expenses: Donna hard at all  Food Insecurity: No Food Insecurity (04/30/2022)   Hunger Vital Sign    Worried About Running Out of Food in the Last Year: Never true    Ran Out of Food in the Last Year: Never true  Transportation Needs: No Transportation Needs (04/30/2022)   PRAPARE - Administrator, Civil Service (Medical): No    Lack of Transportation (Non-Medical): No  Physical Activity: Inactive (  04/30/2022)   Exercise Vital Sign    Days of Exercise per Week: 0 days    Minutes of Exercise per Session: 0 min  Stress: No Stress Concern Present (04/30/2022)   [employer] of Occupational Health - Occupational Stress Questionnaire    Feeling of Stress : Donna at all  Social Connections: Moderately Isolated (04/30/2022)   Social Connection and Isolation Panel [NHANES]    Frequency of Communication with Friends and Family: More than three times a week    Frequency of Social Gatherings with Friends and Family: Twice a week    Attends Religious Services: More than 4 times per year    Active Member of [employer] or Organizations: No    Attends Banker Meetings: Never     Marital Status: Widowed  Intimate Partner Violence: Donna At Risk (04/30/2022)   Humiliation, Afraid, Rape, and Kick questionnaire    Fear of Current or Ex-Partner: No    Emotionally Abused: No    Physically Abused: No    Sexually Abused: No    FAMILY HISTORY: Family History  Problem Relation Age of Onset   CAD Mother    Heart attack Mother    Heart disease Father    Diabetes Cousin    Cancer Cousin     ALLERGIES:  is allergic to other and sulfa antibiotics.  MEDICATIONS:  Current Outpatient Medications  Medication Sig Dispense Refill   Accu-Chek Softclix Lancets lancets Use as instructed 100 each 12   acetaminophen (TYLENOL) 500 MG tablet Take 500 mg by mouth every 6 (six) hours as needed for mild pain or headache.     allopurinol (ZYLOPRIM) 100 MG tablet TAKE 1 TABLET BY MOUTH EVERY DAY 90 tablet 1   atorvastatin (LIPITOR) 40 MG tablet TAKE 1 TABLET BY MOUTH EVERY DAY 90 tablet 1   B Complex Vitamins (GNP VITAMIN B COMPLEX PO) Take 1 tablet by mouth daily.     blood glucose meter kit and supplies Dispense based on patient and insurance preference. Use Malone daily for FBG. (FOR ICD-10 E10.9, E11.9). 1 each 0   Blood Glucose Monitoring Suppl DEVI 1 each by Does Donna apply route in the morning, at noon, and at bedtime. May substitute to any manufacturer covered by patient's insurance. 1 each 0   escitalopram (LEXAPRO) 20 MG tablet TAKE 1 TABLET BY MOUTH EVERY DAY 90 tablet 1   famotidine (PEPCID) 40 MG tablet TAKE 1 TABLET BY MOUTH EVERY DAY 90 tablet 3   glucose blood (ACCU-CHEK GUIDE) test strip Use as instructed 100 each 12   KRILL OIL PO Take by mouth.     levothyroxine (SYNTHROID) 75 MCG tablet Take 1 tablet (75 mcg total) by mouth daily before breakfast. 90 tablet 1   MAGNESIUM-OXIDE PO Take by mouth.     meclizine (ANTIVERT) 25 MG tablet Take 1-2 tablets (25-50 mg total) by mouth 3 (three) times daily as needed for dizziness. 30 tablet 0   Multiple Vitamins-Minerals  (PRESERVISION AREDS 2) CAPS Take 1 capsule by mouth in the morning and at bedtime.     ONETOUCH VERIO test strip USE IN THE MORNING, AT NOON, AND AT BEDTIME. 100 strip 0   Polyethyl Glycol-Propyl Glycol (SYSTANE HYDRATION PF OP) Place 1 drop into both eyes daily as needed (for dry eyes).     tirzepatide [facility]) 2.5 MG/0.5ML Pen Inject 2.5 mg into the skin Malone a week for 28 days. 2 mL 0   traZODone (DESYREL) 50 MG tablet TAKE 1  TABLET BY MOUTH EVERYDAY AT BEDTIME 90 tablet 0   Vitamin D, Ergocalciferol, (DRISDOL) 1.25 MG (50000 UNIT) CAPS capsule TAKE 1 CAPSULE (50,000 UNITS TOTAL) BY MOUTH EVERY 7 (SEVEN) DAYS. SUNDAY 13 capsule 0   No current facility-administered medications for this visit.     PHYSICAL EXAMINATION: ECOG PERFORMANCE STATUS: 1 - Symptomatic Donna completely ambulatory Vitals:   12/21/22 1002  BP: 130/78  Pulse: 90  Resp: 18  Temp: (!) 96.7 F (35.9 C)  SpO2: 97%   Filed Weights   12/21/22 1002  Weight: 195 lb 12.8 oz (88.8 kg)    Physical Exam Constitutional:      General: She is Donna in acute distress.    Comments: Patient walks independently  HENT:     Head: Normocephalic and atraumatic.  Eyes:     General: No scleral icterus. Cardiovascular:     Rate and Rhythm: Normal rate and regular rhythm.     Heart sounds: Normal heart sounds.  Pulmonary:     Effort: Pulmonary effort is normal. No respiratory distress.     Breath sounds: No wheezing.  Abdominal:     General: Bowel sounds are normal. There is no distension.     Palpations: Abdomen is soft.  Musculoskeletal:        General: No deformity. Normal range of motion.     Cervical back: Normal range of motion and neck supple.  Skin:    General: Skin is warm and dry.     Findings: No erythema or rash.  Neurological:     Mental Status: She is alert and oriented to person, place, and time. Mental status is at baseline.     Cranial Nerves: No cranial nerve deficit.     Coordination: Coordination  normal.  Psychiatric:        Mood and Affect: Mood normal.        Latest Ref Rng & Units 12/11/2022   10:01 AM  CMP  Glucose 70 - 99 mg/dL 756   BUN 8 - 23 mg/dL 34   Creatinine 4.33 - 1.00 mg/dL 2.95   Sodium 188 - 416 mmol/L 137   Potassium 3.5 - 5.1 mmol/L 4.2   Chloride 98 - 111 mmol/L 106   CO2 22 - 32 mmol/L 21   Calcium 8.9 - 10.3 mg/dL 9.2   Total Protein 6.5 - 8.1 g/dL 6.8   Total Bilirubin 0.3 - 1.2 mg/dL 0.4   Alkaline Phos 38 - 126 U/L 100   AST 15 - 41 U/L 22   ALT 0 - 44 U/L 22       Latest Ref Rng & Units 12/11/2022   10:01 AM  CBC  WBC 4.0 - 10.5 K/uL 9.3   Hemoglobin 12.0 - 15.0 g/dL 60.6   Hematocrit 30.1 - 46.0 % 35.6   Platelets 150 - 400 K/uL 244      RADIOGRAPHIC STUDIES: I have personally reviewed the radiological images as listed and agreed with the findings in the report. No results found.  LABORATORY DATA:  I have reviewed the data as listed Lab Results  Component Value Date   WBC 9.3 12/11/2022   HGB 11.8 (L) 12/11/2022   HCT 35.6 (L) 12/11/2022   MCV 102.0 (H) 12/11/2022   PLT 244 12/11/2022   Recent Labs    05/18/22 1126 08/23/22 1057 11/01/22 1048 12/11/22 1001  NA 134* 136 137 137  K 5.1 5.3* 5.2 4.2  CL 104 102 101 106  CO2 21* 24  19* 21*  GLUCOSE 84 145* 133* 135*  BUN 31* 33* 42* 34*  CREATININE 2.22* 2.72* 2.32* 1.94*  CALCIUM 9.4 9.1 9.8 9.2  GFRNONAA 22* 17*  --  26*  PROT 6.6 6.8 6.8 6.8  ALBUMIN 3.7 3.7 4.1 3.6  AST 20 26 13 22   ALT 15 24 13 22   ALKPHOS 97 107 119 100  BILITOT 0.7 0.4 0.4 0.4   Iron/TIBC/Ferritin/ %Sat    Component Value Date/Time   IRON 56 04/23/2017 1644   TIBC 362 04/23/2017 1644   FERRITIN 19 04/23/2017 1644   IRONPCTSAT 15 04/23/2017 1644

## 2022-12-21 NOTE — Assessment & Plan Note (Addendum)
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  M protein on UPEP has decreased.  Patient has no  hypercalcemia, Hb>10, no bone lesions on x-ray skeletal survey as well as PET scan.  Repeat skeletal survey, Patient's kidney function is worse. It is difficult to distinguish if her renal function impairment is due to DM or light chain kidney disease. Kidney biopsy will help to clarify and she declined.  Recommend her to follow up with nephrology. If renal function is progressively worse, can consider empirically try MM treatment to see if that helps to improve renal function,   Labs reviewed and discussed with patient, stable kidney function.  I recommend continue observation.follow up in 3 months.

## 2022-12-25 DIAGNOSIS — M9903 Segmental and somatic dysfunction of lumbar region: Secondary | ICD-10-CM | POA: Diagnosis not present

## 2022-12-25 DIAGNOSIS — M5416 Radiculopathy, lumbar region: Secondary | ICD-10-CM | POA: Diagnosis not present

## 2022-12-25 DIAGNOSIS — M9905 Segmental and somatic dysfunction of pelvic region: Secondary | ICD-10-CM | POA: Diagnosis not present

## 2022-12-25 DIAGNOSIS — M5136 Other intervertebral disc degeneration, lumbar region: Secondary | ICD-10-CM | POA: Diagnosis not present

## 2022-12-27 ENCOUNTER — Other Ambulatory Visit: Payer: Self-pay | Admitting: Family Medicine

## 2022-12-27 DIAGNOSIS — N184 Chronic kidney disease, stage 4 (severe): Secondary | ICD-10-CM

## 2022-12-27 DIAGNOSIS — F3342 Major depressive disorder, recurrent, in full remission: Secondary | ICD-10-CM

## 2022-12-27 MED ORDER — OZEMPIC (0.25 OR 0.5 MG/DOSE) 2 MG/3ML ~~LOC~~ SOPN
PEN_INJECTOR | SUBCUTANEOUS | 1 refills | Status: DC
Start: 2022-12-27 — End: 2023-01-08

## 2022-12-27 NOTE — Telephone Encounter (Signed)
Medication Refill - Medication: escitalopram (LEXAPRO) 20 MG tablet   Has the patient contacted their pharmacy? No.  Preferred Pharmacy (with phone number or street name):  [id number] [place], [place] - [address] Phone: [phone]  Fax: [phone]     Has the patient been seen for an appointment in the last year OR does the patient have an upcoming appointment? Yes.    Agent: Please be advised that RX refills may take up to 3 business days. We ask that you follow-up with your pharmacy.

## 2022-12-28 NOTE — Telephone Encounter (Signed)
Unable to refill per protocol, Rx request is too soon for refill. Last refill 10/22/22 for 90 and 1 refill. Patient needs to contact pharmacy.  Requested Prescriptions  Pending Prescriptions Disp Refills   escitalopram (LEXAPRO) 20 MG tablet 90 tablet 1    Sig: Take 1 tablet (20 mg total) by mouth daily.     Psychiatry:  Antidepressants - SSRI Passed - 12/27/2022  4:00 PM      Passed - Completed PHQ-2 or PHQ-9 in the last 360 days      Passed - Valid encounter within last 6 months    Recent Outpatient Visits           1 month ago Right hip pain   [facility], [name], DO   3 months ago Right hip pain   [facility] [name], PA-C   3 months ago Right lower quadrant abdominal pain   [facility] [name], PA-C   4 months ago Right lower quadrant abdominal pain   [facility] [name], PA-C   8 months ago Annual physical exam   [facility] [name], [name]       Future Appointments             In 4 months [name], [name], DO [facility], [facility]

## 2022-12-28 NOTE — Telephone Encounter (Signed)
Pt should have a refill available. Called pharmacy - not open yet.

## 2023-01-02 ENCOUNTER — Telehealth: Payer: Self-pay | Admitting: Family Medicine

## 2023-01-02 ENCOUNTER — Other Ambulatory Visit: Payer: Self-pay | Admitting: Family Medicine

## 2023-01-02 DIAGNOSIS — N184 Chronic kidney disease, stage 4 (severe): Secondary | ICD-10-CM

## 2023-01-02 NOTE — Telephone Encounter (Signed)
Requested medication (s) are due for refill today: Yes  Requested medication (s) are on the active medication list: Yes  Last refill:  12/27/22  Future visit scheduled: Yes  Notes to clinic:  Unable to refill per protocol due to tapering dose. New Rx with dose currently taking.      Requested Prescriptions  Pending Prescriptions Disp Refills   Semaglutide,0.25 or 0.5MG /DOS, (OZEMPIC, 0.25 OR 0.5 MG/DOSE,) 2 MG/3ML SOPN 3 mL 1    Sig: Start 0.25mg  once a week for 4 weeks, then increase to 0.5mg  once a week     Endocrinology:  Diabetes - GLP-1 Receptor Agonists - semaglutide Failed - 01/02/2023  4:40 PM      Failed - HBA1C in normal range and within 180 days    Hgb A1c MFr Bld  Date Value Ref Range Status  11/01/2022 6.1 (H) 4.8 - 5.6 % Final    Comment:             Prediabetes: 5.7 - 6.4          Diabetes: >6.4          Glycemic control for adults with diabetes: <7.0          Failed - Cr in normal range and within 360 days    Creatinine  Date Value Ref Range Status  12/11/2022 1.94 (H) 0.44 - 1.00 mg/dL Final   Creatinine, Urine  Date Value Ref Range Status  03/09/2022 130 mg/dL Final         Passed - Valid encounter within last 6 months    Recent Outpatient Visits           2 months ago Right hip pain   [facility] Sherlyn Hay, DO   3 months ago Right hip pain   [facility] [name], PA-C   3 months ago Right lower quadrant abdominal pain   [place] [facility] [name], PA-C   4 months ago Right lower quadrant abdominal pain   [facility] [name], PA-C   8 months ago Annual physical exam   [facility] [name], [name]       Future Appointments             In 6 days [name], [name], DO [facility], [facility]   In 4 months [name], [name], DO [facility],  [facility]

## 2023-01-02 NOTE — Telephone Encounter (Signed)
Opened in Error.

## 2023-01-02 NOTE — Telephone Encounter (Signed)
Medication Refill - Medication:  MOUNJARO  *Completely out   Patient states she thinks it is the 1.5MG  dose, patient states she has been on this for 2 months and needs a refill  Has the patient contacted their pharmacy? Yes, advised to contact PCP  Preferred Pharmacy (with phone number or street name):  [facility]  Phone: [phone] Fax: [phone]  Has the patient been seen for an appointment in the last year OR does the patient have an upcoming appointment? Last seen 8.1.2024

## 2023-01-04 ENCOUNTER — Other Ambulatory Visit: Payer: Self-pay

## 2023-01-04 ENCOUNTER — Other Ambulatory Visit: Payer: Self-pay | Admitting: Family Medicine

## 2023-01-04 DIAGNOSIS — N184 Chronic kidney disease, stage 4 (severe): Secondary | ICD-10-CM

## 2023-01-04 DIAGNOSIS — I2510 Atherosclerotic heart disease of native coronary artery without angina pectoris: Secondary | ICD-10-CM

## 2023-01-07 ENCOUNTER — Other Ambulatory Visit: Payer: Self-pay

## 2023-01-07 ENCOUNTER — Other Ambulatory Visit: Payer: Self-pay | Admitting: Family Medicine

## 2023-01-07 DIAGNOSIS — M9905 Segmental and somatic dysfunction of pelvic region: Secondary | ICD-10-CM | POA: Diagnosis not present

## 2023-01-07 DIAGNOSIS — M5136 Other intervertebral disc degeneration, lumbar region with discogenic back pain only: Secondary | ICD-10-CM | POA: Diagnosis not present

## 2023-01-07 DIAGNOSIS — Z794 Long term (current) use of insulin: Secondary | ICD-10-CM

## 2023-01-07 DIAGNOSIS — E0822 Diabetes mellitus due to underlying condition with diabetic chronic kidney disease: Secondary | ICD-10-CM

## 2023-01-07 DIAGNOSIS — I2510 Atherosclerotic heart disease of native coronary artery without angina pectoris: Secondary | ICD-10-CM

## 2023-01-07 DIAGNOSIS — M9903 Segmental and somatic dysfunction of lumbar region: Secondary | ICD-10-CM | POA: Diagnosis not present

## 2023-01-07 DIAGNOSIS — M5416 Radiculopathy, lumbar region: Secondary | ICD-10-CM | POA: Diagnosis not present

## 2023-01-08 ENCOUNTER — Ambulatory Visit: Payer: No Typology Code available for payment source | Admitting: Family Medicine

## 2023-01-08 ENCOUNTER — Other Ambulatory Visit: Payer: Self-pay

## 2023-01-08 ENCOUNTER — Other Ambulatory Visit: Payer: Self-pay | Admitting: Family Medicine

## 2023-01-08 ENCOUNTER — Ambulatory Visit (INDEPENDENT_AMBULATORY_CARE_PROVIDER_SITE_OTHER): Payer: No Typology Code available for payment source | Admitting: Physician Assistant

## 2023-01-08 ENCOUNTER — Encounter: Payer: Self-pay | Admitting: Physician Assistant

## 2023-01-08 VITALS — BP 118/73 | HR 73 | Ht 63.0 in | Wt 193.7 lb

## 2023-01-08 DIAGNOSIS — E559 Vitamin D deficiency, unspecified: Secondary | ICD-10-CM

## 2023-01-08 DIAGNOSIS — N184 Chronic kidney disease, stage 4 (severe): Secondary | ICD-10-CM | POA: Diagnosis not present

## 2023-01-08 DIAGNOSIS — F339 Major depressive disorder, recurrent, unspecified: Secondary | ICD-10-CM

## 2023-01-08 DIAGNOSIS — Z794 Long term (current) use of insulin: Secondary | ICD-10-CM | POA: Diagnosis not present

## 2023-01-08 DIAGNOSIS — R1031 Right lower quadrant pain: Secondary | ICD-10-CM

## 2023-01-08 DIAGNOSIS — E0822 Diabetes mellitus due to underlying condition with diabetic chronic kidney disease: Secondary | ICD-10-CM | POA: Diagnosis not present

## 2023-01-08 MED ORDER — TIRZEPATIDE 2.5 MG/0.5ML ~~LOC~~ SOAJ
2.5000 mg | SUBCUTANEOUS | 0 refills | Status: DC
Start: 2023-01-08 — End: 2023-05-27

## 2023-01-08 NOTE — Telephone Encounter (Signed)
D/C by PCP 12/27/22. Requested Prescriptions  Refused Prescriptions Disp Refills   tirzepatide (MOUNJARO) 2.5 MG/0.5ML Pen 2 mL 0    Sig: Inject 2.5 mg into the skin once a week for 28 days.     Off-Protocol Failed - 01/07/2023 11:15 AM      Failed - Medication not assigned to a protocol, review manually.      Passed - Valid encounter within last 12 months    Recent Outpatient Visits           2 months ago Right hip pain   [facility] [place], [name], DO   3 months ago Right hip pain   [facility] [name], PA-C   4 months ago Right lower quadrant abdominal pain   [facility] [name], PA-C   4 months ago Right lower quadrant abdominal pain   [place] [name], PA-C   8 months ago Annual physical exam   [facility] [name], [name]       Future Appointments             Today Cherlynn Polo [facility], [facility]   In 3 months [name], [name], DO [facility], [facility]

## 2023-01-08 NOTE — Progress Notes (Unsigned)
Established patient visit  Patient: Donna Malone   DOB: Jul 07, 1945   77 y.o. Female  MRN: [id number] Visit Date: 01/08/2023  Today's healthcare provider: Debera Lat, PA-C   Chief Complaint  Patient presents with   Right lower abdominal pain       Medication Refill    Lexapro 20 mg   Subjective     Right lower quadrant abdominal pain   Stomach issues that pt is requesting MRI, has been going on for awhile, pain located in the right lower abdomen and radiates around to the lower back, prior treatment was a muscle relaxer that was ineffective, was given 1-2 weeks worth  Medication Refill   Lexapro 20 mg  Per chart review, was seen by Ortho on 10/2022 for insertional rectus abdominal pain. Was Rxed muscle relaxant and rest Was advised to proceed with MRI of pelvis for evaluation.     01/08/2023    4:16 PM 11/01/2022    9:23 AM 08/28/2022    3:05 PM  Depression screen PHQ 2/9  Decreased Interest 0 2 0  Down, Depressed, Hopeless 1 2 2   PHQ - 2 Score 1 4 2   Altered sleeping 1 2 3   Tired, decreased energy 3 3 3   Change in appetite 1 1 3   Feeling bad or failure about yourself  0 0 0  Trouble concentrating 0 0 0  Moving slowly or fidgety/restless 1 0 0  Suicidal thoughts 0 0 0  PHQ-9 Score 7 10 11   Difficult doing work/chores  Somewhat difficult Somewhat difficult      01/08/2023    4:16 PM 06/21/2021    1:23 PM  GAD 7 : Generalized Anxiety Score  Nervous, Anxious, on Edge 0 1  Control/stop worrying 0 0  Worry too much - different things 0 0  Trouble relaxing 0 0  Restless 0 0  Easily annoyed or irritable 0 1  Afraid - awful might happen 0 0  Total GAD 7 Score 0 2    Medications: Outpatient Medications Prior to Visit  Medication Sig   Accu-Chek Softclix Lancets lancets Use as instructed   acetaminophen (TYLENOL) 500 MG tablet Take 500 mg by mouth every 6 (six) hours as needed for mild pain or headache.   allopurinol (ZYLOPRIM) 100 MG tablet TAKE 1 TABLET BY  MOUTH EVERY DAY   atorvastatin (LIPITOR) 40 MG tablet TAKE 1 TABLET BY MOUTH EVERY DAY   B Complex Vitamins (GNP VITAMIN B COMPLEX PO) Take 1 tablet by mouth daily.   blood glucose meter kit and supplies Dispense based on patient and insurance preference. Use once daily for FBG. (FOR ICD-10 E10.9, E11.9).   Blood Glucose Monitoring Suppl DEVI 1 each by Does not apply route in the morning, at noon, and at bedtime. May substitute to any manufacturer covered by patient's insurance.   escitalopram (LEXAPRO) 20 MG tablet TAKE 1 TABLET BY MOUTH EVERY DAY   famotidine (PEPCID) 40 MG tablet TAKE 1 TABLET BY MOUTH EVERY DAY   glucose blood (ACCU-CHEK GUIDE) test strip Use as instructed   KRILL OIL PO Take by mouth.   levothyroxine (SYNTHROID) 75 MCG tablet Take 1 tablet (75 mcg total) by mouth daily before breakfast.   MAGNESIUM-OXIDE PO Take by mouth.   meclizine (ANTIVERT) 25 MG tablet Take 1-2 tablets (25-50 mg total) by mouth 3 (three) times daily as needed for dizziness.   Multiple Vitamins-Minerals (PRESERVISION AREDS 2) CAPS Take 1 capsule by mouth in the morning and at bedtime.  ONETOUCH VERIO test strip USE IN THE MORNING, AT NOON, AND AT BEDTIME.   Polyethyl Glycol-Propyl Glycol (SYSTANE HYDRATION PF OP) Place 1 drop into both eyes daily as needed (for dry eyes).   traZODone (DESYREL) 50 MG tablet TAKE 1 TABLET BY MOUTH EVERYDAY AT BEDTIME   [DISCONTINUED] Semaglutide,0.25 or 0.5MG /DOS, (OZEMPIC, 0.25 OR 0.5 MG/DOSE,) 2 MG/3ML SOPN Start 0.25mg  once a week for 4 weeks, then increase to 0.5mg  once a week   [DISCONTINUED] Vitamin D, Ergocalciferol, (DRISDOL) 1.25 MG (50000 UNIT) CAPS capsule TAKE 1 CAPSULE (50,000 UNITS TOTAL) BY MOUTH EVERY 7 (SEVEN) DAYS. SUNDAY   No facility-administered medications prior to visit.    Review of Systems  All other systems reviewed and are negative.  Except see HPI       Objective    BP 118/73 (BP Location: Right Arm, Patient Position: Sitting, Cuff  Size: Large)   Pulse 73   Ht 5\' 3"  (1.6 m)   Wt 193 lb 11.2 oz (87.9 kg)   SpO2 99%   BMI 34.31 kg/m     Physical Exam Vitals reviewed.  Constitutional:      General: She is not in acute distress.    Appearance: Normal appearance. She is well-developed. She is not diaphoretic.  HENT:     Head: Normocephalic and atraumatic.  Eyes:     General: No scleral icterus.    Conjunctiva/sclera: Conjunctivae normal.  Neck:     Thyroid: No thyromegaly.  Cardiovascular:     Rate and Rhythm: Normal rate and regular rhythm.     Pulses: Normal pulses.     Heart sounds: Normal heart sounds. No murmur heard. Pulmonary:     Effort: Pulmonary effort is normal.     Breath sounds: Normal breath sounds.  Abdominal:     Tenderness: There is abdominal tenderness (RLQ). There is no right CVA tenderness, left CVA tenderness, guarding or rebound.  Musculoskeletal:     Cervical back: Neck supple.     Right lower leg: No edema.     Left lower leg: No edema.  Lymphadenopathy:     Cervical: No cervical adenopathy.  Skin:    General: Skin is warm and dry.     Findings: No rash.  Neurological:     Mental Status: She is alert and oriented to person, place, and time. Mental status is at baseline.  Psychiatric:        Mood and Affect: Mood normal.        Behavior: Behavior normal.      No results found for any visits on 01/08/23.  Assessment & Plan    1. Right lower quadrant abdominal pain Chronic Normal vitals Patient reports pain in the stomach area radiating to the back. Pain is more in the lower portion and upper abdomen. -Order MRI of abdomen and pelvis without contrast due to renal function. -Consider referral to gastroenterology if symptoms persist. Hx of abdominal hysterectomy, cholecystectomy, appendectomy, polypectomy Unclear reason - MR PELVIS WO CONTRAST to rule out postsurgical adhesion, abscess  2. 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 Pt 's GFR was 26. - tirzepatide (MOUNJARO) 2.5 MG/0.5ML Pen; Inject 2.5 mg into the skin once a week.  Dispense: 2 mL; Refill: 0 Advised low carb diet and daily exercise Will Follow-up  Depression Chronic and stable    01/08/2023    4:16  PM 11/01/2022    9:23 AM 08/28/2022    3:05 PM  PHQ9 SCORE ONLY  PHQ-9 Total Score 7 10 11    Has been taking Lexapro 20mg  Per review, there is 1 refill left Will Follow-up  No follow-ups on file.     The patient was advised to call back or seek an in-person evaluation if the symptoms worsen or if the condition fails to improve as anticipated.  I discussed the assessment and treatment plan with the patient. The patient was provided an opportunity to ask questions and all were answered. The patient agreed with the plan and demonstrated an understanding of the instructions.  I, Debera Lat, PA-C have reviewed all documentation for this visit. The documentation on  01/08/23 for the exam, diagnosis, procedures, and orders are all accurate and complete.  Debera Lat, [facility], MMS [facility] [phone] (phone) [phone] (fax)  [facility]

## 2023-01-08 NOTE — Telephone Encounter (Signed)
Requested medication (s) are due for refill today: no  Requested medication (s) are on the active medication list: yes  Last refill:  12/10/22 #13 caps  Future visit scheduled: yes  Notes to clinic:  NT not delegated to NT to RF   Requested Prescriptions  Pending Prescriptions Disp Refills   Vitamin D, Ergocalciferol, (DRISDOL) 1.25 MG (50000 UNIT) CAPS capsule [Pharmacy Med Name: VITAMIN D2 1.25MG (50,000 UNIT)] 13 capsule 0    Sig: TAKE 1 CAPSULE (50,000 UNITS TOTAL) BY MOUTH EVERY 7 (SEVEN) DAYS. SUNDAY     Endocrinology:  Vitamins - Vitamin D Supplementation 2 Failed - 01/08/2023 11:25 AM      Failed - Manual Review: Route requests for 50,000 IU strength to the provider      Passed - Ca in normal range and within 360 days    Calcium  Date Value Ref Range Status  12/11/2022 9.2 8.9 - 10.3 mg/dL Final         Passed - Vitamin D in normal range and within 360 days    Vit D, 25-Hydroxy  Date Value Ref Range Status  11/01/2022 78.1 30.0 - 100.0 ng/mL Final    Comment:    Vitamin D deficiency has been defined by the Institute of Medicine and an Endocrine Society practice guideline as a level of serum 25-OH vitamin D less than 20 ng/mL (1,2). The Endocrine Society went on to further define vitamin D insufficiency as a level between 21 and 29 ng/mL (2). 1. IOM (Institute of Medicine). 2010. Dietary reference    intakes for calcium and D. [place]: The    [employer]. 2. Holick MF, Binkley [place], Bischoff-Ferrari HA, et al.    Evaluation, treatment, and prevention of vitamin D    deficiency: an Endocrine Society clinical practice    guideline. JCEM. 2011 Jul; 96(7):1911-30.          Passed - Valid encounter within last 12 months    Recent Outpatient Visits           2 months ago Right hip pain   [facility] [place], [name], DO   3 months ago Right hip pain   [place] [facility] [name], PA-C   4 months  ago Right lower quadrant abdominal pain   [place] [facility] [name], PA-C   4 months ago Right lower quadrant abdominal pain   [facility] [name], PA-C   8 months ago Annual physical exam   [facility] [name], [name]       Future Appointments             Today Cherlynn Polo [facility], [facility]   In 3 months [name], [name], DO [facility], [facility]

## 2023-01-09 ENCOUNTER — Encounter: Payer: Self-pay | Admitting: Family Medicine

## 2023-01-09 ENCOUNTER — Telehealth: Payer: Self-pay

## 2023-01-09 NOTE — Telephone Encounter (Signed)
Copied from CRM [id number]. Topic: General - Inquiry >> Jan 09, 2023 10:24 AM Patsy Lager T wrote: Reason for CRM: Patient needs to know if per her last visit if Dr Mercy Moore will be making an appt for her to get an MRI or if she needs to call and make the appt herself. Please f/u with patient

## 2023-01-10 ENCOUNTER — Ambulatory Visit: Payer: Self-pay | Admitting: *Deleted

## 2023-01-10 NOTE — Telephone Encounter (Signed)
  Chief Complaint: Pt for an MRI but has not heard from radiology yet.   Seen on Tues. By Debera Lat, PA-C but wants message sent to her PCP Dr. Payton Mccallum. Symptoms: right lower abd pain continues Frequency: constant pain Pertinent Negatives: Patient denies N/A Disposition: [] ED /[] Urgent Care (no appt availability in office) / [] Appointment(In office/virtual)/ []  [place] [facility]/ [] Home Care/ [] Refused Recommended Disposition /[] [place] [facility]/ [x]  Follow-up with PCP Additional Notes: Message sent to Dr. Payton Mccallum letting her know pt has not heard back from radiology regarding MRI.

## 2023-01-10 NOTE — Telephone Encounter (Signed)
Patient advised. Reports they actually just called and scheduled her for tomorrow evening

## 2023-01-10 NOTE — Telephone Encounter (Signed)
Reason for Disposition  [1] MODERATE pain (e.g., interferes with normal activities) AND [2] pain comes and goes (cramps) AND [3] present > 24 hours  (Exception: Pain with Vomiting or Diarrhea - see that Guideline.)    For MRI but has not heard from Radiology.  Answer Assessment - Initial Assessment Questions 1. LOCATION: "Where does it hurt?"      I saw [employer], PA-C Tues.    I'm having right lower quadrant abd pain.  I'm for an MRI but radiology has not called yet.   I was calling to see when I could have the MRI done.   I'm still in a lot of pain.  2. RADIATION: "Does the pain shoot anywhere else?" (e.g., chest, back)     No triage done since she was seen on Tues. For this.    3. ONSET: "When did the pain begin?" (e.g., minutes, hours or days ago)       4. SUDDEN: "Gradual or sudden onset?"      5. PATTERN "Does the pain come and go, or is it constant?"    - If it comes and goes: "How long does it last?" "Do you have pain now?"     (Note: Comes and goes means the pain is intermittent. It goes away completely between bouts.)    - If constant: "Is it getting better, staying the same, or getting worse?"      (Note: Constant means the pain never goes away completely; most serious pain is constant and gets worse.)      Constant pain 6. SEVERITY: "How bad is the pain?"  (e.g., Scale 1-10; mild, moderate, or severe)    - MILD (1-3): Doesn't interfere with normal activities, abdomen soft and not tender to touch.     - MODERATE (4-7): Interferes with normal activities or awakens from sleep, abdomen tender to touch.     - SEVERE (8-10): Excruciating pain, doubled over, unable to do any normal activities.       Moderate.   I would really like to get this MRI done. 7. RECURRENT SYMPTOM: "Have you ever had this type of stomach pain before?" If Yes, ask: "When was the last time?" and "What happened that time?"       8. CAUSE: "What do you think is causing the stomach pain?"     I don't know  which is why I want to get this MRI done as soon as possible but I haven't heard from radiology yet 9. RELIEVING/AGGRAVATING FACTORS: "What makes it better or worse?" (e.g., antacids, bending or twisting motion, bowel movement)      10. OTHER SYMPTOMS: "Do you have any other symptoms?" (e.g., back pain, diarrhea, fever, urination pain, vomiting)        11. PREGNANCY: "Is there any chance you are pregnant?" "When was your last menstrual period?"  Protocols used: Abdominal Pain - [facility]

## 2023-01-11 ENCOUNTER — Ambulatory Visit: Admission: RE | Admit: 2023-01-11 | Payer: No Typology Code available for payment source | Source: Ambulatory Visit

## 2023-01-14 ENCOUNTER — Ambulatory Visit
Admission: RE | Admit: 2023-01-14 | Discharge: 2023-01-14 | Disposition: A | Discharge status: Home / Self Care | Payer: No Typology Code available for payment source | Source: Ambulatory Visit | Attending: Physician Assistant | Admitting: Physician Assistant

## 2023-01-14 DIAGNOSIS — R1031 Right lower quadrant pain: Secondary | ICD-10-CM | POA: Insufficient documentation

## 2023-01-17 ENCOUNTER — Telehealth: Payer: Self-pay | Admitting: Family Medicine

## 2023-01-17 NOTE — Telephone Encounter (Signed)
Patient has called to check on the results of an MRI she had done on 10.14.2024. Please contact patient back @ # [phone].

## 2023-01-21 DIAGNOSIS — N184 Chronic kidney disease, stage 4 (severe): Secondary | ICD-10-CM | POA: Diagnosis not present

## 2023-01-21 NOTE — Telephone Encounter (Signed)
Results of MRI are still pending. We will contact radiology regarding the results.

## 2023-01-22 ENCOUNTER — Other Ambulatory Visit: Payer: Self-pay | Admitting: Family Medicine

## 2023-01-22 ENCOUNTER — Telehealth: Payer: Self-pay

## 2023-01-22 DIAGNOSIS — E038 Other specified hypothyroidism: Secondary | ICD-10-CM

## 2023-01-22 DIAGNOSIS — F5104 Psychophysiologic insomnia: Secondary | ICD-10-CM

## 2023-01-22 NOTE — Telephone Encounter (Signed)
Copied from CRM [id number]. Topic: General - Other >> Jan 22, 2023 10:09 AM Phill Myron wrote: Reason for CRM:  pt  calling for the results of her MRI

## 2023-01-22 NOTE — Telephone Encounter (Signed)
Medication Refill - Medication: MAGNESIUM-OXIDE PO [[id number]]  traZODone (DESYREL) 50 MG tablet [[id number]]  levothyroxine (SYNTHROID) 75 MCG tablet [[id number]]   Has the patient contacted their pharmacy? No.  Preferred Pharmacy (with phone number or street name):  [facility] [address] [place] - [address]  [address] [place] [address]  Phone: [phone] Fax: [phone]  Hours: Not open 24 hours       Has the patient been seen for an appointment in the last year OR does the patient have an upcoming appointment? Yes.    Agent: Please be advised that RX refills may take up to 3 business days. We ask that you follow-up with your pharmacy.

## 2023-01-23 MED ORDER — LEVOTHYROXINE SODIUM 75 MCG PO TABS: 75.0000 ug | ORAL_TABLET | Freq: Every day | ORAL | 1 refills | Status: DC

## 2023-01-23 MED ORDER — TRAZODONE HCL 50 MG PO TABS
50.0000 mg | ORAL_TABLET | Freq: Every day | ORAL | 0 refills | Status: DC
Start: 2023-01-23 — End: 2023-02-21

## 2023-01-23 NOTE — Telephone Encounter (Signed)
Requested medication (s) are due for refill today:   Requested medication (s) are on the active medication list: Yes  Last refill:  06/20/21  Future visit scheduled: Yes  Notes to clinic:  Historical provider.    Requested Prescriptions  Pending Prescriptions Disp Refills   MAGnesium-Oxide 400 (240 Mg) MG tablet      Sig: Take by mouth.     Endocrinology:  Minerals - Magnesium Supplementation Failed - 01/22/2023 10:10 AM      Failed - Mg Level in normal range and within 360 days    Magnesium  Date Value Ref Range Status  02/07/2018 1.2 (L) 1.6 - 2.3 mg/dL Final         Failed - Cr in normal range and within 360 days    Creatinine  Date Value Ref Range Status  12/11/2022 1.94 (H) 0.44 - 1.00 mg/dL Final   Creatinine, Urine  Date Value Ref Range Status  03/09/2022 130 mg/dL Final         Passed - Valid encounter within last 12 months    Recent Outpatient Visits           2 weeks ago Right lower quadrant abdominal pain   [place] [facility] [place], [place], PA-C   2 months ago Right hip pain   [facility] Sherlyn Hay, DO   4 months ago Right hip pain   [place] [facility] [name], PA-C   4 months ago Right lower quadrant abdominal pain   [place] [facility] [name], PA-C   4 months ago Right lower quadrant abdominal pain   [place] [facility] [name], PA-C       Future Appointments             In 3 months [name], [name], DO [place] [facility], [facility]            Signed Prescriptions Disp Refills   traZODone (DESYREL) 50 MG tablet 90 tablet 0    Sig: Take 1 tablet (50 mg total) by mouth at bedtime.     Psychiatry: Antidepressants - Serotonin Modulator Passed - 01/22/2023 10:10 AM      Passed - Completed PHQ-2 or PHQ-9 in the last 360 days      Passed - Valid encounter within last 6 months    Recent  Outpatient Visits           2 weeks ago Right lower quadrant abdominal pain   [place] [facility] [place], [place], PA-C   2 months ago Right hip pain   [facility] [place], [name], DO   4 months ago Right hip pain   [place] [facility] [name], PA-C   4 months ago Right lower quadrant abdominal pain   [place] [facility] [name], PA-C   4 months ago Right lower quadrant abdominal pain   [place] [facility] [name], PA-C       Future Appointments             In 3 months [name], [name], DO  [facility], [facility]             levothyroxine (SYNTHROID) 75 MCG tablet 90 tablet 1    Sig: Take 1 tablet (75 mcg total) by mouth daily before breakfast.     Endocrinology:  Hypothyroid Agents Passed - 01/22/2023 10:10  AM      Passed - TSH in normal range and within 360 days    TSH  Date Value Ref Range Status  11/01/2022 2.130 0.450 - 4.500 uIU/mL Final         Passed - Valid encounter within last 12 months    Recent Outpatient Visits           2 weeks ago Right lower quadrant abdominal pain   [place] [facility] [place], [place], PA-C   2 months ago Right hip pain   [facility] [place], [name], DO   4 months ago Right hip pain   [place] [facility] [name], PA-C   4 months ago Right lower quadrant abdominal pain   [place] [facility] [name], PA-C   4 months ago Right lower quadrant abdominal pain   [place] [facility] [name], PA-C       Future Appointments             In 3 months [name], [name], DO [place] [facility], [facility]

## 2023-01-23 NOTE — Telephone Encounter (Signed)
Requested Prescriptions  Pending Prescriptions Disp Refills   MAGnesium-Oxide 400 (240 Mg) MG tablet      Sig: Take by mouth.     Endocrinology:  Minerals - Magnesium Supplementation Failed - 01/22/2023 10:10 AM      Failed - Mg Level in normal range and within 360 days    Magnesium  Date Value Ref Range Status  02/07/2018 1.2 (L) 1.6 - 2.3 mg/dL Final         Failed - Cr in normal range and within 360 days    Creatinine  Date Value Ref Range Status  12/11/2022 1.94 (H) 0.44 - 1.00 mg/dL Final   Creatinine, Urine  Date Value Ref Range Status  03/09/2022 130 mg/dL Final         Passed - Valid encounter within last 12 months    Recent Outpatient Visits           2 weeks ago Right lower quadrant abdominal pain   [place] [facility] [place], [place], PA-C   2 months ago Right hip pain   [facility] Sherlyn Hay, DO   4 months ago Right hip pain   [place] [facility] [name], PA-C   4 months ago Right lower quadrant abdominal pain   [place] [facility] [name], PA-C   4 months ago Right lower quadrant abdominal pain   [place] [facility] [name], PA-C       Future Appointments             In 3 months [name], [name], DO [place] [facility], [facility]             traZODone (DESYREL) 50 MG tablet 90 tablet 0     Psychiatry: Antidepressants - Serotonin Modulator Passed - 01/22/2023 10:10 AM      Passed - Completed PHQ-2 or PHQ-9 in the last 360 days      Passed - Valid encounter within last 6 months    Recent Outpatient Visits           2 weeks ago Right lower quadrant abdominal pain   [place] [facility] [place], [place], PA-C   2 months ago Right hip pain   [facility] [place], [name], DO   4 months ago Right hip pain   [place] [facility] [name],  PA-C   4 months ago Right lower quadrant abdominal pain   [place] [name], PA-C   4 months ago Right lower quadrant abdominal pain   [place] [facility] [name], PA-C       Future Appointments             In 3 months [name], [name], DO  [facility], [facility]             levothyroxine (SYNTHROID) 75 MCG tablet 90 tablet 1    Sig: Take 1 tablet (75 mcg total) by mouth daily before breakfast.     Endocrinology:  Hypothyroid Agents Passed - 01/22/2023 10:10 AM      Passed - TSH in normal range and within 360 days    TSH  Date Value Ref Range Status  11/01/2022 2.130 0.450 - 4.500 uIU/mL Final         Passed - Valid encounter within last 12 months    Recent Outpatient Visits  2 weeks ago Right lower quadrant abdominal pain   [place] [facility] [place], [place], [place]   2 months ago Right hip pain   [facility] [place], [name], DO   4 months ago Right hip pain   [place] [facility] [name], PA-C   4 months ago Right lower quadrant abdominal pain   [facility] [name], PA-C   4 months ago Right lower quadrant abdominal pain   [place] [facility] [name], PA-C       Future Appointments             In 3 months [name], [name], DO [place] [facility], [facility]

## 2023-01-25 ENCOUNTER — Telehealth: Payer: Self-pay

## 2023-01-25 DIAGNOSIS — D2272 Melanocytic nevi of left lower limb, including hip: Secondary | ICD-10-CM | POA: Diagnosis not present

## 2023-01-25 DIAGNOSIS — D2271 Melanocytic nevi of right lower limb, including hip: Secondary | ICD-10-CM | POA: Diagnosis not present

## 2023-01-25 DIAGNOSIS — D225 Melanocytic nevi of trunk: Secondary | ICD-10-CM | POA: Diagnosis not present

## 2023-01-25 DIAGNOSIS — D2262 Melanocytic nevi of left upper limb, including shoulder: Secondary | ICD-10-CM | POA: Diagnosis not present

## 2023-01-25 DIAGNOSIS — L821 Other seborrheic keratosis: Secondary | ICD-10-CM | POA: Diagnosis not present

## 2023-01-25 DIAGNOSIS — L728 Other follicular cysts of the skin and subcutaneous tissue: Secondary | ICD-10-CM | POA: Diagnosis not present

## 2023-01-25 DIAGNOSIS — D2261 Melanocytic nevi of right upper limb, including shoulder: Secondary | ICD-10-CM | POA: Diagnosis not present

## 2023-01-25 NOTE — Telephone Encounter (Signed)
Copied from CRM [id number]. Topic: General - Other >> Jan 25, 2023  2:54 PM [place] B wrote: Reason for CRM: pt called in states she is gonna get Ozempic for free for 4 months from the company, and she says she has 2 weeks left of Mounjaro and she will be switching to [employer]

## 2023-01-25 NOTE — Telephone Encounter (Signed)
Patient has seen results of MRI.

## 2023-01-27 ENCOUNTER — Other Ambulatory Visit: Payer: Self-pay | Admitting: Family Medicine

## 2023-01-27 DIAGNOSIS — E1122 Type 2 diabetes mellitus with diabetic chronic kidney disease: Secondary | ICD-10-CM

## 2023-01-27 MED ORDER — MAGNESIUM OXIDE -MG SUPPLEMENT 400 (240 MG) MG PO TABS: 400.0000 mg | ORAL_TABLET | Freq: Every day | ORAL | 1 refills | Status: AC

## 2023-01-29 ENCOUNTER — Telehealth: Payer: Self-pay | Admitting: Family Medicine

## 2023-01-29 NOTE — Telephone Encounter (Signed)
Informed patient that her Ozempic has arrived and was ready for pick up.

## 2023-01-30 ENCOUNTER — Encounter: Payer: Self-pay | Admitting: Family Medicine

## 2023-02-01 ENCOUNTER — Other Ambulatory Visit: Payer: Self-pay | Admitting: Physician Assistant

## 2023-02-01 ENCOUNTER — Telehealth: Payer: Self-pay | Admitting: Family Medicine

## 2023-02-01 DIAGNOSIS — N184 Chronic kidney disease, stage 4 (severe): Secondary | ICD-10-CM

## 2023-02-01 NOTE — Telephone Encounter (Signed)
Morrie Sheldon, from [employer] [facility] [employer] has called in regards to stating that patient has a form of cancer and [employer] wants to know if she had a previous diagnoses of cancer before and also wanted to verify the first date that the patient was seen at the practice. Please advise.  [employer] callback # [phone] REF # [id number]

## 2023-02-01 NOTE — Telephone Encounter (Signed)
Requested medication (s) are due for refill today:   Yes  Requested medication (s) are on the active medication list:   Yes  Future visit scheduled:   Yes with Dr. Payton Mccallum 05/06/2023   Last ordered: 01/08/2023 2 ml, 0 refills  Returned because no protocol assigned to this medication   Requested Prescriptions  Pending Prescriptions Disp Refills   MOUNJARO 2.5 MG/0.5ML Pen [Pharmacy Med Name: MOUNJARO 2.5 MG/0.5 ML PEN]      Sig: INJECT 2.5 MG SUBCUTANEOUSLY WEEKLY     Off-Protocol Failed - 02/01/2023  2:42 AM      Failed - Medication not assigned to a protocol, review manually.      Passed - Valid encounter within last 12 months    Recent Outpatient Visits           3 weeks ago Right lower quadrant abdominal pain   [place] [facility] [place], [place], PA-C   3 months ago Right hip pain   [facility] [place], [name], DO   4 months ago Right hip pain   [place] [facility] [name], PA-C   4 months ago Right lower quadrant abdominal pain   [place] [facility] [name], PA-C   5 months ago Right lower quadrant abdominal pain   [place] [facility] [name], PA-C       Future Appointments             In 3 months [name], [name], DO [place] [facility], [facility]

## 2023-02-05 DIAGNOSIS — M5136 Other intervertebral disc degeneration, lumbar region with discogenic back pain only: Secondary | ICD-10-CM | POA: Diagnosis not present

## 2023-02-05 DIAGNOSIS — M9903 Segmental and somatic dysfunction of lumbar region: Secondary | ICD-10-CM | POA: Diagnosis not present

## 2023-02-05 DIAGNOSIS — M5416 Radiculopathy, lumbar region: Secondary | ICD-10-CM | POA: Diagnosis not present

## 2023-02-05 DIAGNOSIS — M9905 Segmental and somatic dysfunction of pelvic region: Secondary | ICD-10-CM | POA: Diagnosis not present

## 2023-02-06 ENCOUNTER — Telehealth: Payer: Self-pay

## 2023-02-06 NOTE — Telephone Encounter (Signed)
Copied from CRM [id number]. Topic: General - Other >> Feb 06, 2023  9:54 AM Tiffany B wrote: Patient will be transitioning to [facility] but unsure when she is moving in. Patient is requesting her most recent physical notes- 05/01/2022, please fax to [facility], Attention: [employer] # [phone]. Patient would like a follow up call when completed.

## 2023-02-11 DIAGNOSIS — E1122 Type 2 diabetes mellitus with diabetic chronic kidney disease: Secondary | ICD-10-CM | POA: Diagnosis not present

## 2023-02-11 DIAGNOSIS — D472 Monoclonal gammopathy: Secondary | ICD-10-CM | POA: Diagnosis not present

## 2023-02-11 DIAGNOSIS — N184 Chronic kidney disease, stage 4 (severe): Secondary | ICD-10-CM | POA: Diagnosis not present

## 2023-02-11 DIAGNOSIS — R803 Bence Jones proteinuria: Secondary | ICD-10-CM | POA: Diagnosis not present

## 2023-02-11 DIAGNOSIS — N2581 Secondary hyperparathyroidism of renal origin: Secondary | ICD-10-CM | POA: Diagnosis not present

## 2023-02-11 DIAGNOSIS — E875 Hyperkalemia: Secondary | ICD-10-CM | POA: Diagnosis not present

## 2023-02-12 NOTE — Telephone Encounter (Addendum)
Called [employer] spoke with Donna Malone. He verified about the pathology report date. Also asked about ICD 10 and CPT code used for the biopsy. I told him best department to reach out for that was the patient's Oncologist. They are the best one's to answer all the questions about patient's Dx's.

## 2023-02-19 ENCOUNTER — Other Ambulatory Visit: Payer: Self-pay | Admitting: Family Medicine

## 2023-02-19 DIAGNOSIS — F5104 Psychophysiologic insomnia: Secondary | ICD-10-CM

## 2023-02-19 DIAGNOSIS — E1122 Type 2 diabetes mellitus with diabetic chronic kidney disease: Secondary | ICD-10-CM | POA: Diagnosis not present

## 2023-02-19 DIAGNOSIS — M109 Gout, unspecified: Secondary | ICD-10-CM

## 2023-02-19 DIAGNOSIS — M9905 Segmental and somatic dysfunction of pelvic region: Secondary | ICD-10-CM | POA: Diagnosis not present

## 2023-02-19 DIAGNOSIS — M5136 Other intervertebral disc degeneration, lumbar region with discogenic back pain only: Secondary | ICD-10-CM | POA: Diagnosis not present

## 2023-02-19 DIAGNOSIS — N2581 Secondary hyperparathyroidism of renal origin: Secondary | ICD-10-CM | POA: Diagnosis not present

## 2023-02-19 DIAGNOSIS — M9903 Segmental and somatic dysfunction of lumbar region: Secondary | ICD-10-CM | POA: Diagnosis not present

## 2023-02-19 DIAGNOSIS — M5416 Radiculopathy, lumbar region: Secondary | ICD-10-CM | POA: Diagnosis not present

## 2023-02-19 DIAGNOSIS — D472 Monoclonal gammopathy: Secondary | ICD-10-CM | POA: Diagnosis not present

## 2023-02-19 DIAGNOSIS — N184 Chronic kidney disease, stage 4 (severe): Secondary | ICD-10-CM | POA: Diagnosis not present

## 2023-02-19 NOTE — Telephone Encounter (Signed)
Donna Malone from [place] [place] given messages and she stated she reached out to oncologist and has all the information she needs.Stated she reached out to oncologist and was seen Dec 2022 and patho report 3/23

## 2023-02-19 NOTE — Telephone Encounter (Signed)
This encounter was created in error - please disregard.

## 2023-03-04 DIAGNOSIS — M5136 Other intervertebral disc degeneration, lumbar region with discogenic back pain only: Secondary | ICD-10-CM | POA: Diagnosis not present

## 2023-03-04 DIAGNOSIS — M5416 Radiculopathy, lumbar region: Secondary | ICD-10-CM | POA: Diagnosis not present

## 2023-03-04 DIAGNOSIS — M9903 Segmental and somatic dysfunction of lumbar region: Secondary | ICD-10-CM | POA: Diagnosis not present

## 2023-03-04 DIAGNOSIS — M9905 Segmental and somatic dysfunction of pelvic region: Secondary | ICD-10-CM | POA: Diagnosis not present

## 2023-03-08 DIAGNOSIS — R208 Other disturbances of skin sensation: Secondary | ICD-10-CM | POA: Diagnosis not present

## 2023-03-08 DIAGNOSIS — L720 Epidermal cyst: Secondary | ICD-10-CM | POA: Diagnosis not present

## 2023-03-08 DIAGNOSIS — L728 Other follicular cysts of the skin and subcutaneous tissue: Secondary | ICD-10-CM | POA: Diagnosis not present

## 2023-03-21 ENCOUNTER — Other Ambulatory Visit: Payer: Self-pay | Admitting: Family Medicine

## 2023-03-21 DIAGNOSIS — E7800 Pure hypercholesterolemia, unspecified: Secondary | ICD-10-CM

## 2023-03-21 NOTE — Telephone Encounter (Signed)
Requested Prescriptions  Pending Prescriptions Disp Refills   atorvastatin (LIPITOR) 40 MG tablet [Pharmacy Med Name: ATORVASTATIN 40 MG TABLET] 90 tablet 1    Sig: TAKE 1 TABLET BY MOUTH EVERY DAY     Cardiovascular:  Antilipid - Statins Failed - 03/21/2023 11:59 AM      Failed - Lipid Panel in normal range within the last 12 months    Cholesterol, Total  Date Value Ref Range Status  11/01/2022 191 100 - 199 mg/dL Final   LDL Chol Calc (NIH)  Date Value Ref Range Status  11/01/2022 89 0 - 99 mg/dL Final   HDL  Date Value Ref Range Status  11/01/2022 33 (L) >39 mg/dL Final   Triglycerides  Date Value Ref Range Status  11/01/2022 417 (H) 0 - 149 mg/dL Final         Passed - Patient is not pregnant      Passed - Valid encounter within last 12 months    Recent Outpatient Visits           2 months ago Right lower quadrant abdominal pain   [place] [facility] [place], [place], PA-C   4 months ago Right hip pain   [facility] [place], [name], DO   6 months ago Right hip pain   [place] [name], PA-C   6 months ago Right lower quadrant abdominal pain   [place] [name], PA-C   6 months ago Right lower quadrant abdominal pain   [place] [facility] [name], PA-C       Future Appointments             In 1 month [name], [name], DO [facility], [facility]

## 2023-04-02 ENCOUNTER — Inpatient Hospital Stay: Payer: No Typology Code available for payment source | Attending: Oncology

## 2023-04-02 DIAGNOSIS — C9000 Multiple myeloma not having achieved remission: Secondary | ICD-10-CM | POA: Diagnosis not present

## 2023-04-02 LAB — CMP (CANCER CENTER ONLY)
ALT: 22 U/L (ref 0–44)
AST: 25 U/L (ref 15–41)
Albumin: 3.6 g/dL (ref 3.5–5.0)
Alkaline Phosphatase: 99 U/L (ref 38–126)
Anion gap: 11 (ref 5–15)
BUN: 21 mg/dL (ref 8–23)
CO2: 22 mmol/L (ref 22–32)
Calcium: 9.3 mg/dL (ref 8.9–10.3)
Chloride: 103 mmol/L (ref 98–111)
Creatinine: 1.9 mg/dL — ABNORMAL HIGH (ref 0.44–1.00)
GFR, Estimated: 27 mL/min — ABNORMAL LOW (ref >60)
Glucose, Bld: 127 mg/dL — ABNORMAL HIGH (ref 70–99)
Potassium: 4.4 mmol/L (ref 3.5–5.1)
Sodium: 136 mmol/L (ref 135–145)
Total Bilirubin: 0.4 mg/dL (ref 0.0–1.2)
Total Protein: 6.6 g/dL (ref 6.5–8.1)

## 2023-04-02 LAB — CBC WITH DIFFERENTIAL (CANCER CENTER ONLY)
Abs Immature Granulocytes: 0.02 10*3/uL (ref 0.00–0.07)
Basophils Absolute: 0.1 10*3/uL (ref 0.0–0.1)
Basophils Relative: 1 %
Eosinophils Absolute: 0.3 10*3/uL (ref 0.0–0.5)
Eosinophils Relative: 4 %
HCT: 35.2 % — ABNORMAL LOW (ref 36.0–46.0)
Hemoglobin: 11.8 g/dL — ABNORMAL LOW (ref 12.0–15.0)
Immature Granulocytes: 0 %
Lymphocytes Relative: 34 %
Lymphs Abs: 2.6 10*3/uL (ref 0.7–4.0)
MCH: 34.2 pg — ABNORMAL HIGH (ref 26.0–34.0)
MCHC: 33.5 g/dL (ref 30.0–36.0)
MCV: 102 fL — ABNORMAL HIGH (ref 80.0–100.0)
Monocytes Absolute: 0.7 10*3/uL (ref 0.1–1.0)
Monocytes Relative: 9 %
Neutro Abs: 4 10*3/uL (ref 1.7–7.7)
Neutrophils Relative %: 52 %
Platelet Count: 242 10*3/uL (ref 150–400)
RBC: 3.45 MIL/uL — ABNORMAL LOW (ref 3.87–5.11)
RDW: 13.2 % (ref 11.5–15.5)
WBC Count: 7.7 10*3/uL (ref 4.0–10.5)
nRBC: 0 % (ref 0.0–0.2)

## 2023-04-04 LAB — KAPPA/LAMBDA LIGHT CHAINS
Kappa free light chain: 172.9 mg/L — ABNORMAL HIGH (ref 3.3–19.4)
Kappa, lambda light chain ratio: 6.57 — ABNORMAL HIGH (ref 0.26–1.65)
Lambda free light chains: 26.3 mg/L (ref 5.7–26.3)

## 2023-04-05 LAB — IFE+PROTEIN ELECTRO, 24-HR UR
% BETA, Urine: 5.1 %
ALPHA 1 URINE: 2.4 %
Albumin, U: 29.4 %
Alpha 2, Urine: 4 %
GAMMA GLOBULIN URINE: 59.1 %
M-SPIKE %, Urine: 35.8 % — ABNORMAL HIGH
M-Spike, Mg/24 Hr: 125 mg/(24.h) — ABNORMAL HIGH
Total Protein, Urine-Ur/day: 348 mg/(24.h) — ABNORMAL HIGH (ref 30–150)
Total Protein, Urine: 12.9 mg/dL
Total Volume: 2700

## 2023-04-12 ENCOUNTER — Inpatient Hospital Stay: Payer: PPO | Attending: Oncology | Admitting: Oncology

## 2023-04-12 ENCOUNTER — Encounter: Payer: Self-pay | Admitting: Oncology

## 2023-04-12 VITALS — BP 130/78 | HR 92 | Temp 97.5°F | Wt 187.0 lb

## 2023-04-12 DIAGNOSIS — N184 Chronic kidney disease, stage 4 (severe): Secondary | ICD-10-CM

## 2023-04-12 DIAGNOSIS — C9000 Multiple myeloma not having achieved remission: Secondary | ICD-10-CM | POA: Insufficient documentation

## 2023-04-12 DIAGNOSIS — D72820 Lymphocytosis (symptomatic): Secondary | ICD-10-CM | POA: Diagnosis not present

## 2023-04-12 LAB — MULTIPLE MYELOMA PANEL, SERUM
Albumin SerPl Elph-Mcnc: 3.1 g/dL (ref 2.9–4.4)
Albumin/Glob SerPl: 1.1 (ref 0.7–1.7)
Alpha 1: 0.3 g/dL (ref 0.0–0.4)
Alpha2 Glob SerPl Elph-Mcnc: 1.2 g/dL — ABNORMAL HIGH (ref 0.4–1.0)
B-Globulin SerPl Elph-Mcnc: 1.1 g/dL (ref 0.7–1.3)
Gamma Glob SerPl Elph-Mcnc: 0.5 g/dL (ref 0.4–1.8)
Globulin, Total: 3.1 g/dL (ref 2.2–3.9)
IgA: 693 mg/dL — ABNORMAL HIGH (ref 64–422)
IgG (Immunoglobin G), Serum: 473 mg/dL — ABNORMAL LOW (ref 586–1602)
IgM (Immunoglobulin M), Srm: 111 mg/dL (ref 26–217)
Total Protein ELP: 6.2 g/dL (ref 6.0–8.5)

## 2023-04-12 NOTE — Progress Notes (Signed)
 Hematology/Oncology Progress note [phone] [phone]      CHIEF COMPLAINTS/REASON FOR VISIT:  Smoldering Multiple myeloma, monoclonal lymphocytosis.   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  M protein on UPEP has decreased.  Patient has no  hypercalcemia, Hb>10, no bone lesions on x-ray skeletal survey as well as PET scan.  Repeat skeletal survey, Patient's kidney function is worse. It is difficult to distinguish if her renal function impairment is due to DM or light chain kidney disease. Kidney biopsy will help to clarify and she declined.  Recommend her to follow up with nephrology. If renal function is progressively worse, can consider empirically try MM treatment to see if that helps to improve renal function,    Lab Results  Component Value Date   MPROTEIN Comment (A) 12/11/2022   KPAFRELGTCHN 172.9 (H) 04/02/2023   LAMBDASER 26.3 04/02/2023   KAPLAMBRATIO 6.57 (H) 04/02/2023      Labs reviewed and discussed with patient, stable kidney function.  I recommend continue observation.follow up in 3 months.   Monoclonal B-cell lymphocytosis of unknown significance Minute clone of B cells. 1 % cells <5000 per ul. Continue observation.  Stable and normal total WBC and lymphocyte count.    Orders Placed This Encounter  Procedures   DG Bone Survey Met    Standing Status:   Future    Expected Date:   06/10/2023    Expiration Date:   04/11/2024    Reason for Exam (SYMPTOM  OR DIAGNOSIS REQUIRED):   multiple myeloma    Preferred imaging  location?:   [facility]   CBC with Differential (Cancer Center Only)    Standing Status:   Future    Expected Date:   07/11/2023    Expiration Date:   04/11/2024   CMP (Cancer Center only)    Standing Status:   Future    Expected Date:   07/11/2023    Expiration Date:   04/11/2024   Multiple Myeloma Panel (SPEP&IFE w/QIG)    Standing Status:   Future    Expected Date:   07/11/2023    Expiration Date:   04/11/2024   Kappa/lambda light chains    Standing Status:   Future    Expected Date:   07/11/2023    Expiration Date:   04/11/2024   IFE+PROTEIN ELECTRO, 24-HR UR    Standing Status:   Future    Expected Date:   07/11/2023    Expiration Date:   04/11/2024   Follow-up in 3 months. All questions were answered. The patient knows to call the clinic with any problems, questions or concerns.  Call Babara, MD, PhD [facility] Hematology Oncology 04/12/2023     HISTORY OF PRESENTING ILLNESS:  Donna Malone is a  78 y.o.  female with PMH listed below who was referred to me for evaluation of leukocytosis Reviewed patient' recent labs obtained by PCP.   10/22/2019 CBC showed elevated white count of 11.3, predominantly lymphocytosis.  Normal hemoglobin and platelet count. Previous lab records reviewed. Leukocytosis onset of chronic, duration is since January 2021.   No aggravating or elevated factors. Associated  symptoms or signs:  Denies weight loss, fever, chills,night sweats.  Endorses fatigue Smoking history: Never smoker History of recent oral steroid use or steroid injection: No recent steroid injections. History of recent infection: Denies Autoimmune disease history.  Denies  Patient denies any chronic wound, prosthesis.  She has chronic knee arthritis.  #  flow cytometry showed CD5+ , CD23 + B-cell population.  CLL/SLL phenotype.  CD38 negative, <1% leukocytes,<5000 cells/ul # 06/29/2021, bone marrow biopsy showed hyper cellular marrow with plasma cell neoplasm, 10 to 15%  plasma cell infiltrates, predominant.  Cytogenetics is normal.  Multiple myeloma FISH panel normal.  Standard risk.  Patient takes Lasix   for lower extremity swelling.  09/07/2022 US  renal ultrasound previously showed Mild bilateral renal atrophy as well as increased echogenicity of renal parenchyma INTERVAL HISTORY Donna Malone is a 78 y.o. female who has above history reviewed by me today presents for follow up smothering multiple myeloma. She feels well today. No new complains.    Review of Systems  Constitutional:  Positive for fatigue. Negative for appetite change, chills and fever.  HENT:   Negative for hearing loss and voice change.   Eyes:  Negative for eye problems.  Respiratory:  Negative for chest tightness and cough.   Cardiovascular:  Negative for chest pain.  Gastrointestinal:  Negative for abdominal distention, abdominal pain and blood in stool.  Endocrine: Negative for hot flashes.  Genitourinary:  Negative for difficulty urinating and frequency.   Musculoskeletal:  Positive for arthralgias.  Skin:  Negative for itching and rash.  Neurological:  Negative for extremity weakness.  Hematological:  Negative for adenopathy.  Psychiatric/Behavioral:  Negative for confusion.      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    SURGICAL HISTORY: Past Surgical History:  Procedure Laterality Date   ABDOMINAL HYSTERECTOMY  04/1970   partial   CHOLECYSTECTOMY  late 1990's   COLONOSCOPY WITH PROPOFOL  N/A 10/09/2021   Procedure: COLONOSCOPY WITH PROPOFOL ;  Surgeon: Therisa Bi, MD;  [facility];   Service: Gastroenterology;  Laterality: N/A;   COLONOSCOPY WITH PROPOFOL  N/A 11/23/2022   Procedure: COLONOSCOPY WITH PROPOFOL ;  Surgeon: Therisa Bi, MD;  Location: [facility] ENDOSCOPY;  Service: Gastroenterology;  Laterality: N/A;   LAPAROSCOPIC APPENDECTOMY N/A 03/14/2022   Procedure: APPENDECTOMY LAPAROSCOPIC;  Surgeon: Desiderio Schanz, MD;  Location: [facility] ORS;  Service: General;  Laterality: N/A;   POLYPECTOMY  11/23/2022   Procedure: POLYPECTOMY;  Surgeon: Therisa Bi, MD;  Location: [facility] END[facility]COPY;  Service: Gastroenterology;;    SOCIAL HISTORY: Social History   Socioeconomic History   Marital status: Widowed    Spouse name: Cooper   Number of children: 3   Years of education: Not on file   Highest education level: 12th grade  Occupational History   Occupation: retired  Tobacco Use   Smoking status: Never   Smokeless tobacco: Never  Vaping Use   Vaping status: Never Used  Substance and Sexual Activity   Alcohol use: No   Drug use: No   Sexual activity: Not Currently  Other Topics Concern   Not on file  Social History Narrative  Not on file   Social Drivers of Health   Financial Resource Strain: Low Risk  (04/30/2022)   Overall Financial Resource Strain (CARDIA)    Difficulty of Paying Living Expenses: Not hard at all  Food Insecurity: No Food Insecurity (04/30/2022)   Hunger Vital Sign    Worried About Running Out of Food in the Last Year: Never true    Ran Out of Food in the Last Year: Never true  Transportation Needs: No Transportation Needs (04/30/2022)   PRAPARE - Administrator, Civil Service (Medical): No    Lack of Transportation (Non-Medical): No  Physical Activity: Inactive (04/30/2022)   Exercise Vital Sign    Days of Exercise per Week: 0 days    Minutes of Exercise per Session: 0 min  Stress: No Stress Concern Present (04/30/2022)   [employer] of Occupational Health - Occupational Stress Questionnaire    Feeling of Stress : Not at all   Social Connections: Moderately Isolated (04/30/2022)   Social Connection and Isolation Panel [NHANES]    Frequency of Communication with Friends and Family: More than three times a week    Frequency of Social Gatherings with Friends and Family: Twice a week    Attends Religious Services: More than 4 times per year    Active Member of [employer] or Organizations: No    Attends Banker Meetings: Never    Marital Status: Widowed  Intimate Partner Violence: Not At Risk (04/30/2022)   Humiliation, Afraid, Rape, and Kick questionnaire    Fear of Current or Ex-Partner: No    Emotionally Abused: No    Physically Abused: No    Sexually Abused: No    FAMILY HISTORY: Family History  Problem Relation Age of Onset   CAD Mother    Heart attack Mother    Heart disease Father    Diabetes Cousin    Cancer Cousin     ALLERGIES:  is allergic to other and sulfa antibiotics.  MEDICATIONS:  Current Outpatient Medications  Medication Sig Dispense Refill   Accu-Chek Softclix Lancets lancets Use as instructed 100 each 12   acetaminophen  (TYLENOL ) 500 MG tablet Take 500 mg by mouth every 6 (six) hours as needed for mild pain or headache.     allopurinol  (ZYLOPRIM ) 100 MG tablet TAKE 1 TABLET BY MOUTH EVERY DAY 90 tablet 1   atorvastatin  (LIPITOR) 40 MG tablet TAKE 1 TABLET BY MOUTH EVERY DAY 90 tablet 1   B Complex Vitamins (GNP VITAMIN B COMPLEX PO) Take 1 tablet by mouth daily.     blood glucose meter kit and supplies Dispense based on patient and insurance preference. Use once daily for FBG. (FOR ICD-10 E10.9, E11.9). 1 each 0   Blood Glucose Monitoring Suppl DEVI 1 each by Does not apply route in the morning, at noon, and at bedtime. May substitute to any manufacturer covered by patient's insurance. 1 each 0   escitalopram  (LEXAPRO ) 20 MG tablet TAKE 1 TABLET BY MOUTH EVERY DAY 90 tablet 1   famotidine  (PEPCID ) 40 MG tablet TAKE 1 TABLET BY MOUTH EVERY DAY 90 tablet 3   glucose blood  (ACCU-CHEK GUIDE) test strip Use as instructed 100 each 12   KRILL OIL PO Take by mouth.     levothyroxine  (SYNTHROID ) 75 MCG tablet Take 1 tablet (75 mcg total) by mouth daily before breakfast. 90 tablet 1   MAGnesium -Oxide 400 (240 Mg) MG tablet Take 1 tablet (400 mg total) by mouth daily. 30 tablet  1   meclizine  (ANTIVERT ) 25 MG tablet Take 1-2 tablets (25-50 mg total) by mouth 3 (three) times daily as needed for dizziness. 30 tablet 0   Multiple Vitamins-Minerals (PRESERVISION AREDS 2) CAPS Take 1 capsule by mouth in the morning and at bedtime.     ONETOUCH VERIO test strip USE IN THE MORNING, AT NOON, AND AT BEDTIME. 100 strip 0   Polyethyl Glycol-Propyl Glycol (SYSTANE HYDRATION PF OP) Place 1 drop into both eyes daily as needed (for dry eyes).     Semaglutide ,0.25 or 0.5MG /DOS, (OZEMPIC , 0.25 OR 0.5 MG/DOSE,) 2 MG/3ML SOPN Inject 0.5 mg as directed per week     traZODone  (DESYREL ) 50 MG tablet TAKE 1 TABLET BY MOUTH EVERYDAY AT BEDTIME 90 tablet 0   Vitamin D , Ergocalciferol , (DRISDOL ) 1.25 MG (50000 UNIT) CAPS capsule TAKE 1 CAPSULE (50,000 UNITS TOTAL) BY MOUTH EVERY 7 (SEVEN) DAYS. SUNDAY 13 capsule 0   tirzepatide  (MOUNJARO ) 2.5 MG/0.5ML Pen Inject 2.5 mg into the skin once a week. (Patient not taking: Reported on 04/12/2023) 2 mL 0   No current facility-administered medications for this visit.     PHYSICAL EXAMINATION: ECOG PERFORMANCE STATUS: 1 - Symptomatic but completely ambulatory Vitals:   04/12/23 1042  BP: 130/78  Pulse: 92  Temp: (!) 97.5 F (36.4 C)   Filed Weights   04/12/23 1042  Weight: 187 lb (84.8 kg)    Physical Exam Constitutional:      General: She is not in acute distress.    Comments: Patient walks independently  HENT:     Head: Normocephalic and atraumatic.  Eyes:     General: No scleral icterus. Cardiovascular:     Rate and Rhythm: Normal rate and regular rhythm.     Heart sounds: Normal heart sounds.  Pulmonary:     Effort: Pulmonary effort  is normal. No respiratory distress.     Breath sounds: No wheezing.  Abdominal:     General: Bowel sounds are normal. There is no distension.     Palpations: Abdomen is soft.  Musculoskeletal:        General: No deformity. Normal range of motion.     Cervical back: Normal range of motion and neck supple.  Skin:    General: Skin is warm and dry.     Findings: No erythema or rash.  Neurological:     Mental Status: She is alert and oriented to person, place, and time. Mental status is at baseline.     Cranial Nerves: No cranial nerve deficit.     Coordination: Coordination normal.  Psychiatric:        Mood and Affect: Mood normal.        Latest Ref Rng & Units 04/02/2023   10:23 AM  CMP  Glucose 70 - 99 mg/dL 872   BUN 8 - 23 mg/dL 21   Creatinine 9.55 - 1.00 mg/dL 8.09   Sodium 864 - 854 mmol/L 136   Potassium 3.5 - 5.1 mmol/L 4.4   Chloride 98 - 111 mmol/L 103   CO2 22 - 32 mmol/L 22   Calcium  8.9 - 10.3 mg/dL 9.3   Total Protein 6.5 - 8.1 g/dL 6.6   Total Bilirubin 0.0 - 1.2 mg/dL 0.4   Alkaline Phos 38 - 126 U/L 99   AST 15 - 41 U/L 25   ALT 0 - 44 U/L 22       Latest Ref Rng & Units 04/02/2023   10:23 AM  CBC  WBC 4.0 -  10.5 K/uL 7.7   Hemoglobin 12.0 - 15.0 g/dL 88.1   Hematocrit 63.9 - 46.0 % 35.2   Platelets 150 - 400 K/uL 242      RADIOGRAPHIC STUDIES: I have personally reviewed the radiological images as listed and agreed with the findings in the report. No results found.  LABORATORY DATA:  I have reviewed the data as listed Lab Results  Component Value Date   WBC 7.7 04/02/2023   HGB 11.8 (L) 04/02/2023   HCT 35.2 (L) 04/02/2023   MCV 102.0 (H) 04/02/2023   PLT 242 04/02/2023   Recent Labs    08/23/22 1057 11/01/22 1048 12/11/22 1001 04/02/23 1023  NA 136 137 137 136  K 5.3* 5.2 4.2 4.4  CL 102 101 106 103  CO2 24 19* 21* 22  GLUCOSE 145* 133* 135* 127*  BUN 33* 42* 34* 21  CREATININE 2.72* 2.32* 1.94* 1.90*  CALCIUM  9.1 9.8 9.2 9.3   GFRNONAA 17*  --  26* 27*  PROT 6.8 6.8 6.8 6.6  ALBUMIN 3.7 4.1 3.6 3.6  AST 26 13 22 25   ALT 24 13 22 22   ALKPHOS 107 119 100 99  BILITOT 0.4 0.4 0.4 0.4   Iron/TIBC/Ferritin/ %Sat    Component Value Date/Time   IRON 56 04/23/2017 1644   TIBC 362 04/23/2017 1644   FERRITIN 19 04/23/2017 1644   IRONPCTSAT 15 04/23/2017 1644

## 2023-04-12 NOTE — Assessment & Plan Note (Signed)
Minute clone of B cells. 1 % cells <5000 per ul. Continue observation.  Stable and normal total WBC and lymphocyte count.

## 2023-04-12 NOTE — Assessment & Plan Note (Signed)
Encourage oral hydration avoid nephrotoxins.  Continue follow-up with nephrology.

## 2023-04-12 NOTE — Assessment & Plan Note (Addendum)
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  M protein on UPEP has decreased.  Patient has no  hypercalcemia, Hb>10, no bone lesions on x-ray skeletal survey as well as PET scan.  Repeat skeletal survey, Patient's kidney function is worse. It is difficult to distinguish if her renal function impairment is due to DM or light chain kidney disease. Kidney biopsy will help to clarify and she declined.  Recommend her to follow up with nephrology. If renal function is progressively worse, can consider empirically try MM treatment to see if that helps to improve renal function,    Lab Results  Component Value Date   MPROTEIN Comment (A) 12/11/2022   KPAFRELGTCHN 172.9 (H) 04/02/2023   LAMBDASER 26.3 04/02/2023   KAPLAMBRATIO 6.57 (H) 04/02/2023      Labs reviewed and discussed with patient, stable kidney function.  I recommend continue observation.follow up in 3 months.

## 2023-05-06 ENCOUNTER — Encounter: Payer: Self-pay | Admitting: Family Medicine

## 2023-05-20 ENCOUNTER — Telehealth: Payer: Self-pay | Admitting: Family Medicine

## 2023-05-20 DIAGNOSIS — F3342 Major depressive disorder, recurrent, in full remission: Secondary | ICD-10-CM

## 2023-05-20 DIAGNOSIS — K219 Gastro-esophageal reflux disease without esophagitis: Secondary | ICD-10-CM

## 2023-05-20 NOTE — Telephone Encounter (Addendum)
[facility] faxed refill request for the following medications:   famotidine (PEPCID) 40 MG tablet     escitalopram (LEXAPRO) 20 MG tablet   Please advise.

## 2023-05-21 MED ORDER — ESCITALOPRAM OXALATE 20 MG PO TABS: 20.0000 mg | ORAL_TABLET | Freq: Every day | ORAL | 0 refills | Status: DC

## 2023-05-21 MED ORDER — FAMOTIDINE 40 MG PO TABS: 40.0000 mg | ORAL_TABLET | Freq: Every day | ORAL | 0 refills | Status: DC

## 2023-05-22 ENCOUNTER — Other Ambulatory Visit: Payer: Self-pay | Admitting: Family Medicine

## 2023-05-22 DIAGNOSIS — E559 Vitamin D deficiency, unspecified: Secondary | ICD-10-CM

## 2023-05-23 ENCOUNTER — Other Ambulatory Visit: Payer: Self-pay | Admitting: Family Medicine

## 2023-05-23 DIAGNOSIS — F5104 Psychophysiologic insomnia: Secondary | ICD-10-CM

## 2023-05-23 NOTE — Telephone Encounter (Signed)
Requested medication (s) are due for refill today: yes  Requested medication (s) are on the active medication list: yes  Last refill:  01/09/23  Future visit scheduled: yes  Notes to clinic:  Manual Review: Route requests for 50,000 IU strength to the provider      Requested Prescriptions  Pending Prescriptions Disp Refills   Vitamin D, Ergocalciferol, (DRISDOL) 1.25 MG (50000 UNIT) CAPS capsule [Pharmacy Med Name: VITAMIN D2 1.25MG (50,000 UNIT)] 13 capsule 0    Sig: TAKE 1 CAPSULE (50,000 UNITS TOTAL) BY MOUTH EVERY 7 (SEVEN) DAYS. SUNDAY     Endocrinology:  Vitamins - Vitamin D Supplementation 2 Failed - 05/23/2023 11:51 AM      Failed - Manual Review: Route requests for 50,000 IU strength to the provider      Passed - Ca in normal range and within 360 days    Calcium  Date Value Ref Range Status  04/02/2023 9.3 8.9 - 10.3 mg/dL Final         Passed - Vitamin D in normal range and within 360 days    Vit D, 25-Hydroxy  Date Value Ref Range Status  11/01/2022 78.1 30.0 - 100.0 ng/mL Final    Comment:    Vitamin D deficiency has been defined by the Institute of Medicine and an Endocrine Society practice guideline as a level of serum 25-OH vitamin D less than 20 ng/mL (1,2). The Endocrine Society went on to further define vitamin D insufficiency as a level between 21 and 29 ng/mL (2). 1. IOM (Institute of Medicine). 2010. Dietary reference    intakes for calcium and D. [place]: The    [employer]. 2. Holick MF, Binkley [place], Bischoff-Ferrari HA, et al.    Evaluation, treatment, and prevention of vitamin D    deficiency: an Endocrine Society clinical practice    guideline. JCEM. 2011 Jul; 96(7):1911-30.          Passed - Valid encounter within last 12 months    Recent Outpatient Visits           4 months ago Right lower quadrant abdominal pain   [place] [facility], [place], PA-C   6 months ago Right hip pain   [facility] Sherlyn Hay, DO   8 months ago Right hip pain   [place] [facility] [name], PA-C   8 months ago Right lower quadrant abdominal pain   [facility] [name], PA-C   8 months ago Right lower quadrant abdominal pain   [place] [facility] [name], PA-C       Future Appointments             In 4 days [name], [name], DO [facility], [facility]

## 2023-05-23 NOTE — Telephone Encounter (Signed)
Requested Prescriptions  Pending Prescriptions Disp Refills   traZODone (DESYREL) 50 MG tablet [Pharmacy Med Name: TRAZODONE 50 MG TABLET] 90 tablet 0    Sig: TAKE 1 TABLET BY MOUTH EVERYDAY AT BEDTIME     Psychiatry: Antidepressants - Serotonin Modulator Passed - 05/23/2023  4:16 PM      Passed - Completed PHQ-2 or PHQ-9 in the last 360 days      Passed - Valid encounter within last 6 months    Recent Outpatient Visits           4 months ago Right lower quadrant abdominal pain   [place] [facility], [place], PA-C   6 months ago Right hip pain   [facility] Sherlyn Hay, DO   8 months ago Right hip pain   [place] [facility] [name], PA-C   8 months ago Right lower quadrant abdominal pain   [place] [facility] [name], PA-C   8 months ago Right lower quadrant abdominal pain   [place] [facility] [name], PA-C       Future Appointments             In 4 days [name], [name], DO  [facility]

## 2023-05-27 ENCOUNTER — Encounter: Payer: Self-pay | Admitting: Family Medicine

## 2023-05-27 ENCOUNTER — Ambulatory Visit (INDEPENDENT_AMBULATORY_CARE_PROVIDER_SITE_OTHER): Payer: PPO | Admitting: Family Medicine

## 2023-05-27 VITALS — BP 109/68 | HR 76 | Resp 18 | Ht 63.0 in | Wt 183.6 lb

## 2023-05-27 DIAGNOSIS — Z0001 Encounter for general adult medical examination with abnormal findings: Secondary | ICD-10-CM

## 2023-05-27 DIAGNOSIS — R0609 Other forms of dyspnea: Secondary | ICD-10-CM

## 2023-05-27 DIAGNOSIS — Z794 Long term (current) use of insulin: Secondary | ICD-10-CM

## 2023-05-27 DIAGNOSIS — Z0000 Encounter for general adult medical examination without abnormal findings: Secondary | ICD-10-CM

## 2023-05-27 DIAGNOSIS — E034 Atrophy of thyroid (acquired): Secondary | ICD-10-CM | POA: Diagnosis not present

## 2023-05-27 DIAGNOSIS — E559 Vitamin D deficiency, unspecified: Secondary | ICD-10-CM | POA: Diagnosis not present

## 2023-05-27 DIAGNOSIS — F339 Major depressive disorder, recurrent, unspecified: Secondary | ICD-10-CM

## 2023-05-27 DIAGNOSIS — N184 Chronic kidney disease, stage 4 (severe): Secondary | ICD-10-CM

## 2023-05-27 DIAGNOSIS — E0822 Diabetes mellitus due to underlying condition with diabetic chronic kidney disease: Secondary | ICD-10-CM

## 2023-05-27 NOTE — Progress Notes (Unsigned)
 Complete physical exam   Patient: Donna Malone   DOB: 1945/10/04   78 y.o. Female  MRN: [id number] Visit Date: 05/27/2023  Today's healthcare provider: Sherlyn Hay, DO   Chief Complaint  Patient presents with  . Annual Exam  . Medicare Annual Wellness   Subjective    Donna Malone is a 78 y.o. female who presents today for a complete physical exam.  She reports consuming a general diet. The patient does not participate in regular exercise at present. She generally feels fairly well. She reports sleeping well but thinks she is sleeping too much. She does not have additional problems to discuss today.  HPI  Eye exam scheduled for 06/03/2023  Donna Malone is an 78 year old female who presents for a physical exam.  She experiences persistent abdominal pain and fatigue, with a notable lack of energy and motivation to engage in activities she previously enjoyed, such as visiting friends. She feels 'tired all the time' and only attends church on Sundays. She is taking escitalopram 20 mg for mood, which she believes has been effective in the past, but she continues to feel down and prefers to stay at home.  She experiences shortness of breath with minimal exertion, such as walking a block or climbing stairs, which leaves her 'out of breath.' She avoids stairs unless necessary and does not engage in regular exercise due to breathlessness. No chest pain, wheezing, or frequent leg swelling, but she reports being short of breath with exertion and feeling hoarse.  She is currently taking Ozempic for diabetes management and prefers it over glipizide, despite experiencing some pain. She is awaiting her A1c results to assess her diabetes control.  She takes famotidine for reflux and a stool softener to manage constipation, which she attributes to Ozempic use. She reports occasional constipation but manages it with medication. She experiences shoulder discomfort and occasional  constipation.  She has been on a high-dose vitamin D regimen for years, with her levels consistently in the 70s and 80s. She recently refilled her prescription and is compliant with her medication.  She has a history of multiple myeloma and has undergone regular mammograms, which have been normal. She had a biopsy years ago that revealed calcium deposits but no malignancy.  She has received three COVID-19 vaccinations but is hesitant to receive further doses. She has also received a flu shot and possibly a pneumonia vaccine at [facility]. She believes she sleeps too much and goes to bed late.  OZEMPIC ***** 0.5 mg dose  [facility] program - comes here; otherwise through hospital [facility] ***  Past Medical History:  Diagnosis Date  . Bone spur    heel  . Chronic kidney disease, stage III (moderate) (HCC) 09/21/2014  . Depression   . Diabetes mellitus without complication (HCC)   . Gout   . 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 Need for tetanus booster 05/04/2021  . Stage 3b chronic kidney disease (HCC) 07/13/2021   Past Surgical History:  Procedure Laterality Date  . ABDOMINAL HYSTERECTOMY  04/1970   partial  . CHOLECYSTECTOMY  late 1990's  . COLONOSCOPY WITH PROPOFOL N/A 10/09/2021   Procedure: COLONOSCOPY WITH PROPOFOL;  Surgeon: Wyline Mood, MD;  Location: [facility] ENDOSCOPY;  Service: Gastroenterology;  Laterality: N/A;  . COLONOSCOPY WITH PROPOFOL N/A 11/23/2022   Procedure: COLONOSCOPY WITH PROPOFOL;  Surgeon: Wyline Mood, MD;  Location: [facility] ENDOSCOPY;  Service: Gastroenterology;  Laterality: N/A;  .  LAPAROSCOPIC APPENDECTOMY N/A 03/14/2022   Procedure: APPENDECTOMY LAPAROSCOPIC;  Surgeon: Henrene Dodge, MD;  Location: [facility] ORS;  Service: General;  Laterality: N/A;  . POLYPECTOMY  11/23/2022   Procedure: POLYPECTOMY;  Surgeon: Wyline Mood, MD;  Location: [facility] ENDOSCOPY;  Service: Gastroenterology;;   Social History    Socioeconomic History  . Marital status: Widowed    Spouse name: Danne Harbor  . Number of children: 3  . Years of education: Not on file  . Highest education level: 12th grade  Occupational History  . Occupation: retired  Tobacco Use  . Smoking status: Never  . Smokeless tobacco: Never  Vaping Use  . Vaping status: Never Used  Substance and Sexual Activity  . Alcohol use: No  . Drug use: No  . Sexual activity: Not Currently  Other Topics Concern  . Not on file  Social History Narrative  . Not on file   Social Drivers of Health   Financial Resource Strain: Low Risk  (04/30/2022)   Overall Financial Resource Strain (CARDIA)   . Difficulty of Paying Living Expenses: Not hard at all  Food Insecurity: No Food Insecurity (04/30/2022)   Hunger Vital Sign   . Worried About Programme researcher, broadcasting/film/video in the Last Year: Never true   . Ran Out of Food in the Last Year: Never true  Transportation Needs: No Transportation Needs (04/30/2022)   PRAPARE - Transportation   . Lack of Transportation (Medical): No   . Lack of Transportation (Non-Medical): No  Physical Activity: Inactive (04/30/2022)   Exercise Vital Sign   . Days of Exercise per Week: 0 days   . Minutes of Exercise per Session: 0 min  Stress: No Stress Concern Present (04/30/2022)   [employer] of Occupational Health - Occupational Stress Questionnaire   . Feeling of Stress : Not at all  Social Connections: Moderately Isolated (04/30/2022)   Social Connection and Isolation Panel [NHANES]   . Frequency of Communication with Friends and Family: More than three times a week   . Frequency of Social Gatherings with Friends and Family: Twice a week   . Attends Religious Services: More than 4 times per year   . Active Member of Clubs or Organizations: No   . Attends Banker Meetings: Never   . Marital Status: Widowed  Intimate Partner Violence: Not At Risk (04/30/2022)   Humiliation, Afraid, Rape, and Kick questionnaire    . Fear of Current or Ex-Partner: No   . Emotionally Abused: No   . Physically Abused: No   . Sexually Abused: No   Family Status  Relation Name Status  . Mother  Deceased at age 38  . Father  Deceased at age 83  . Brother  Deceased at age 6       due to gunshot wound  . Cousin  (Not Specified)  No partnership data on file   Family History  Problem Relation Age of Onset  . CAD Mother   . Heart attack Mother   . Heart disease Father   . Diabetes Cousin   . Cancer Cousin    Allergies  Allergen Reactions  . Sulfa Antibiotics Other (See Comments)    Patient Care Team: Felica Chargois, [name], DO as PCP - General (Family Medicine) Irene Limbo., MD as Consulting Physician (Ophthalmology) Lyndle Herrlich, MD as Consulting Physician (Orthopedic Surgery) Mosetta Pigeon, MD (Nephrology) Rickard Patience, MD as Consulting Physician (Oncology) Gaspar Cola, [facility] (Inactive) (Pharmacist)   Medications: Outpatient Medications Prior to  Visit  Medication Sig  . Accu-Chek Softclix Lancets lancets Use as instructed  . acetaminophen (TYLENOL) 500 MG tablet Take 500 mg by mouth every 6 (six) hours as needed for mild pain or headache.  . allopurinol (ZYLOPRIM) 100 MG tablet TAKE 1 TABLET BY MOUTH EVERY DAY  . atorvastatin (LIPITOR) 40 MG tablet TAKE 1 TABLET BY MOUTH EVERY DAY  . B Complex Vitamins (GNP VITAMIN B COMPLEX PO) Take 1 tablet by mouth daily.  . blood glucose meter kit and supplies Dispense based on patient and insurance preference. Use once daily for FBG. (FOR ICD-10 E10.9, E11.9).  Marland Kitchen Blood Glucose Monitoring Suppl DEVI 1 each by Does not apply route in the morning, at noon, and at bedtime. May substitute to any manufacturer covered by patient's insurance.  . escitalopram (LEXAPRO) 20 MG tablet Take 1 tablet (20 mg total) by mouth daily.  . famotidine (PEPCID) 40 MG tablet Take 1 tablet (40 mg total) by mouth daily.  Marland Kitchen glucose blood (ACCU-CHEK GUIDE) test strip Use as instructed  .  KRILL OIL PO Take by mouth.  . levothyroxine (SYNTHROID) 75 MCG tablet Take 1 tablet (75 mcg total) by mouth daily before breakfast.  . MAGnesium-Oxide 400 (240 Mg) MG tablet Take 1 tablet (400 mg total) by mouth daily.  . meclizine (ANTIVERT) 25 MG tablet Take 1-2 tablets (25-50 mg total) by mouth 3 (three) times daily as needed for dizziness.  . Multiple Vitamins-Minerals (PRESERVISION AREDS 2) CAPS Take 1 capsule by mouth in the morning and at bedtime.  Letta Pate VERIO test strip USE IN THE MORNING, AT NOON, AND AT BEDTIME.  Bertram Gala Glycol-Propyl Glycol (SYSTANE HYDRATION PF OP) Place 1 drop into both eyes daily as needed (for dry eyes).  . Semaglutide,0.25 or 0.5MG /DOS, (OZEMPIC, 0.25 OR 0.5 MG/DOSE,) 2 MG/3ML SOPN Inject 0.5 mg as directed per week  . traZODone (DESYREL) 50 MG tablet TAKE 1 TABLET BY MOUTH EVERYDAY AT BEDTIME  . Vitamin D, Ergocalciferol, (DRISDOL) 1.25 MG (50000 UNIT) CAPS capsule TAKE 1 CAPSULE (50,000 UNITS TOTAL) BY MOUTH EVERY 7 (SEVEN) DAYS. SUNDAY  . [DISCONTINUED] tirzepatide [facility]) 2.5 MG/0.5ML Pen Inject 2.5 mg into the skin once a week. (Patient not taking: Reported on 04/12/2023)   No facility-administered medications prior to visit.    Review of Systems {Insert previous labs (optional):23779} {See past labs  Heme  Chem  Endocrine  Serology  Results Review (optional):1}  Objective    BP 109/68 (BP Location: Left Arm, Cuff Size: Normal)   Pulse 76   Resp 18   Ht 5\' 3"  (1.6 m)   Wt 183 lb 9.6 oz (83.3 kg)   SpO2 98%   BMI 32.52 kg/m  {Insert last BP/Wt (optional):23777}{See vitals history (optional):1}  Physical Exam    Last depression screening scores    05/27/2023    2:30 PM 01/08/2023    4:16 PM 11/01/2022    9:23 AM  PHQ 2/9 Scores  PHQ - 2 Score 5 1 4   PHQ- 9 Score 11 7 10    Last fall risk screening    05/27/2023    2:25 PM  Fall Risk   Falls in the past year? 0  Number falls in past yr: 0  Injury with Fall? 0   Last  Audit-C alcohol use screening    11/01/2022    9:23 AM  Alcohol Use Disorder Test (AUDIT)  1. How often do you have a drink containing alcohol? 0  2. How many drinks containing  alcohol do you have on a typical day when you are drinking? 0  3. How often do you have six or more drinks on one occasion? 0  AUDIT-C Score 0   A score of 3 or more in women, and 4 or more in men indicates increased risk for alcohol abuse, EXCEPT if all of the points are from question 1   No results found for any visits on 05/27/23.  Assessment & Plan    Routine Health Maintenance and Physical Exam  Exercise Activities and Dietary recommendations  Goals     . DIET - EAT MORE FRUITS AND VEGETABLES    . DIET - INCREASE WATER INTAKE    . Exercise 150 minutes per week (moderate activity)     Recommend to start back with water aerobics for 3 days a week for 55 minutes.      . Monitor and Manage My Blood Sugar-Diabetes Type 2     Timeframe:  Long-Range Goal Priority:  High Start Date: 06/01/2021                            Expected End Date: 06/02/2022                      Follow Up within 30 days   - check blood sugar at prescribed times - check blood sugar if I feel it is too high or too low - enter blood sugar readings and medication or insulin into daily log - take the blood sugar log to all doctor visits    Why is this important?   Checking your blood sugar at home helps to keep it from getting very high or very low.  Writing the results in a diary or log helps the doctor know how to care for you.  Your blood sugar log should have the time, date and the results.  Also, write down the amount of insulin or other medicine that you take.  Other information, like what you ate, exercise done and how you were feeling, will also be helpful.     Notes:         Immunization History  Administered Date(s) Administered  . Fluad Quad(high Dose 65+) 04/20/2019, 05/04/2021  . Influenza Split 12/16/2009,  03/21/2011, 01/30/2012  . Influenza, High Dose Seasonal PF 02/09/2015, 03/20/2016, 12/24/2016, 02/07/2018, 01/28/2023  . Influenza,inj,Quad PF,6+ Mos 02/06/2013, 12/03/2013  . PFIZER(Purple Top)SARS-COV-2 Vaccination 06/19/2019, 07/14/2019, 03/18/2020  . Pneumococcal Conjugate-13 12/03/2013  . Pneumococcal Polysaccharide-23 03/21/2011  . Tdap 04/09/2008, 05/04/2021  . Zoster Recombinant(Shingrix) 05/04/2021    Health Maintenance  Topic Date Due  . MAMMOGRAM  04/23/2019  . OPHTHALMOLOGY EXAM  11/08/2020  . Zoster Vaccines- Shingrix (2 of 2) 06/29/2021  . FOOT EXAM  09/27/2021  . COVID-19 Vaccine (4 - 2024-25 season) 12/02/2022  . Diabetic kidney evaluation - Urine ACR  03/10/2023  . Medicare Annual Wellness (AWV)  05/01/2023  . HEMOGLOBIN A1C  05/04/2023  . Diabetic kidney evaluation - eGFR measurement  04/01/2024  . DTaP/Tdap/Td (3 - Td or Tdap) 05/05/2031  . Pneumonia Vaccine 78+ Years old  Completed  . INFLUENZA VACCINE  Completed  . DEXA SCAN  Completed  . HPV VACCINES  Aged Out  . Hepatitis C Screening  Discontinued  . Fecal DNA (Cologuard)  Discontinued    Discussed health benefits of physical activity, and encouraged her to engage in regular exercise appropriate for her age and condition.  There are no diagnoses linked to this encounter.  Shortness of Breath Reports dyspnea with minimal exertion, such as walking a block or climbing stairs. No wheezing or significant peripheral edema noted. Differential includes asthma, COPD, or other pulmonary issues. Discussed pulmonary function test to evaluate for asthma or COPD. - Order pulmonary function test at [facility] - Consider pulmonology referral based on test results  Type 2 Diabetes Mellitus Currently on Ozempic and prefers it over glipizide. Experiences some pain but finds Ozempic more effective for diabetes management. Awaiting HbA1c results to assess control. - Continue Ozempic 0.5 mg - Check HbA1c  levels  Depression Reports fatigue, lack of energy, and apathy. On escitalopram 20 mg, which has been somewhat effective. Discussed adding aripiprazole (2 mg) as an adjunct, but she prefers to continue current medication due to renal concerns. Encouraged outdoor activities and chair exercises to improve mood and energy levels. - Continue escitalopram 20 mg daily - Encourage outdoor activities and chair exercises  Gastroesophageal Reflux Disease (GERD) Taking famotidine for reflux. No new symptoms reported. - Continue famotidine as needed  Vitamin D Deficiency On high-dose vitamin D for years with consistent levels in the 70s and 80s. Recent level was 78. Compliant with regimen. - Continue high-dose vitamin D supplementation  General Health Maintenance 78 years old with normal mammograms for years. Had a flu shot but unsure about the shingles vaccine. Had three COVID-19 vaccinations but does not want any more. Discussed mammogram, but she is hesitant due to age and history of normal results. - Order mammogram if she consents - Discuss shingles vaccine - No further COVID-19 vaccinations per her preference  Follow-up - Send blood work results to Dr. Thedore Mins - Schedule follow-up in three months.  On high-dose vitamin D for years  ***  No follow-ups on file.     I discussed the assessment and treatment plan with the patient  The patient was provided an opportunity to ask questions and all were answered. The patient agreed with the plan and demonstrated an understanding of the instructions.   The patient was advised to call back or seek an in-person evaluation if the symptoms worsen or if the condition fails to improve as anticipated.    Sherlyn Hay, DO  [facility] [phone] (phone) [phone] (fax)  [facility]

## 2023-05-27 NOTE — Patient Instructions (Signed)
 Recommend shingles vaccine (shingrix)

## 2023-05-28 DIAGNOSIS — E1122 Type 2 diabetes mellitus with diabetic chronic kidney disease: Secondary | ICD-10-CM | POA: Diagnosis not present

## 2023-05-28 DIAGNOSIS — N2581 Secondary hyperparathyroidism of renal origin: Secondary | ICD-10-CM | POA: Diagnosis not present

## 2023-05-28 DIAGNOSIS — D472 Monoclonal gammopathy: Secondary | ICD-10-CM | POA: Diagnosis not present

## 2023-05-28 DIAGNOSIS — N184 Chronic kidney disease, stage 4 (severe): Secondary | ICD-10-CM | POA: Diagnosis not present

## 2023-05-28 LAB — CBC WITH DIFFERENTIAL/PLATELET
Basophils Absolute: 0.1 10*3/uL (ref 0.0–0.2)
Basos: 1 %
EOS (ABSOLUTE): 0.3 10*3/uL (ref 0.0–0.4)
Eos: 3 %
Hematocrit: 34.5 % (ref 34.0–46.6)
Hemoglobin: 12.1 g/dL (ref 11.1–15.9)
Immature Grans (Abs): 0 10*3/uL (ref 0.0–0.1)
Immature Granulocytes: 0 %
Lymphocytes Absolute: 3.9 10*3/uL — ABNORMAL HIGH (ref 0.7–3.1)
Lymphs: 42 %
MCH: 35.5 pg — ABNORMAL HIGH (ref 26.6–33.0)
MCHC: 35.1 g/dL (ref 31.5–35.7)
MCV: 101 fL — ABNORMAL HIGH (ref 79–97)
Monocytes Absolute: 0.6 10*3/uL (ref 0.1–0.9)
Monocytes: 7 %
Neutrophils Absolute: 4.4 10*3/uL (ref 1.4–7.0)
Neutrophils: 47 %
Platelets: 250 10*3/uL (ref 150–450)
RBC: 3.41 x10E6/uL — ABNORMAL LOW (ref 3.77–5.28)
RDW: 13.1 % (ref 11.7–15.4)
WBC: 9.3 10*3/uL (ref 3.4–10.8)

## 2023-05-28 LAB — COMPREHENSIVE METABOLIC PANEL
ALT: 11 [IU]/L (ref 0–32)
AST: 16 [IU]/L (ref 0–40)
Albumin: 4 g/dL (ref 3.8–4.8)
Alkaline Phosphatase: 140 [IU]/L — ABNORMAL HIGH (ref 44–121)
BUN/Creatinine Ratio: 12 (ref 12–28)
BUN: 22 mg/dL (ref 8–27)
Bilirubin Total: 0.3 mg/dL (ref 0.0–1.2)
CO2: 22 mmol/L (ref 20–29)
Calcium: 9.7 mg/dL (ref 8.7–10.3)
Chloride: 102 mmol/L (ref 96–106)
Creatinine, Ser: 1.77 mg/dL — ABNORMAL HIGH (ref 0.57–1.00)
Globulin, Total: 2.5 g/dL (ref 1.5–4.5)
Glucose: 108 mg/dL — ABNORMAL HIGH (ref 70–99)
Potassium: 5.2 mmol/L (ref 3.5–5.2)
Sodium: 138 mmol/L (ref 134–144)
Total Protein: 6.5 g/dL (ref 6.0–8.5)
eGFR: 29 mL/min/{1.73_m2} — ABNORMAL LOW (ref >59)

## 2023-05-28 LAB — MAGNESIUM: Magnesium: 1.8 mg/dL (ref 1.6–2.3)

## 2023-05-28 LAB — VITAMIN D 25 HYDROXY (VIT D DEFICIENCY, FRACTURES): Vit D, 25-Hydroxy: 73.6 ng/mL (ref 30.0–100.0)

## 2023-05-28 LAB — URIC ACID: Uric Acid: 4.9 mg/dL (ref 3.1–7.9)

## 2023-05-28 LAB — LIPID PANEL
Chol/HDL Ratio: 4.8 {ratio} — ABNORMAL HIGH (ref 0.0–4.4)
Cholesterol, Total: 177 mg/dL (ref 100–199)
HDL: 37 mg/dL — ABNORMAL LOW (ref >39)
LDL Chol Calc (NIH): 82 mg/dL (ref 0–99)
Triglycerides: 354 mg/dL — ABNORMAL HIGH (ref 0–149)
VLDL Cholesterol Cal: 58 mg/dL — ABNORMAL HIGH (ref 5–40)

## 2023-05-28 LAB — TSH RFX ON ABNORMAL TO FREE T4: TSH: 0.551 u[IU]/mL (ref 0.450–4.500)

## 2023-05-28 LAB — MICROALBUMIN / CREATININE URINE RATIO
Creatinine, Urine: 173.4 mg/dL
Microalb/Creat Ratio: 47 mg/g{creat} — ABNORMAL HIGH (ref 0–29)
Microalbumin, Urine: 81.6 ug/mL

## 2023-05-28 LAB — PHOSPHORUS: Phosphorus: 3.8 mg/dL (ref 3.0–4.3)

## 2023-05-28 LAB — HEMOGLOBIN A1C
Est. average glucose Bld gHb Est-mCnc: 120 mg/dL
Hgb A1c MFr Bld: 5.8 % — ABNORMAL HIGH (ref 4.8–5.6)

## 2023-05-28 LAB — PARATHYROID HORMONE, INTACT (NO CA): PTH: 82 pg/mL — ABNORMAL HIGH (ref 15–65)

## 2023-05-29 DIAGNOSIS — M5136 Other intervertebral disc degeneration, lumbar region with discogenic back pain only: Secondary | ICD-10-CM | POA: Diagnosis not present

## 2023-05-29 DIAGNOSIS — M5416 Radiculopathy, lumbar region: Secondary | ICD-10-CM | POA: Diagnosis not present

## 2023-05-29 DIAGNOSIS — M9905 Segmental and somatic dysfunction of pelvic region: Secondary | ICD-10-CM | POA: Diagnosis not present

## 2023-05-29 DIAGNOSIS — M9903 Segmental and somatic dysfunction of lumbar region: Secondary | ICD-10-CM | POA: Diagnosis not present

## 2023-05-29 NOTE — Assessment & Plan Note (Signed)
 Currently on Ozempic and prefers it over glipizide. Experiences some pain but finds Ozempic more effective for diabetes management. Awaiting HbA1c results to assess control. - Continue Ozempic 0.5 mg - Check HbA1c levels

## 2023-05-30 ENCOUNTER — Encounter: Payer: Self-pay | Admitting: Family Medicine

## 2023-05-30 DIAGNOSIS — R0609 Other forms of dyspnea: Secondary | ICD-10-CM | POA: Insufficient documentation

## 2023-05-30 NOTE — Assessment & Plan Note (Signed)
 Reports dyspnea with minimal exertion, such as walking a block or climbing stairs. No wheezing or significant peripheral edema noted. Differential includes asthma, COPD, or other pulmonary issues. Discussed pulmonary function test to evaluate for asthma or COPD. - Order pulmonary function test at [facility] - Consider pulmonology referral based on test results

## 2023-05-30 NOTE — Assessment & Plan Note (Signed)
 On high-dose vitamin D for years with consistent levels in the 70s and 80s. Recent level was 78. Compliant with regimen. - Continue high-dose vitamin D supplementation

## 2023-05-30 NOTE — Assessment & Plan Note (Signed)
 78 years old with normal mammograms for years. Had a flu shot but unsure about the shingles vaccine. Had three COVID-19 vaccinations but does not want any more. Discussed mammogram, but she is reluctant due to age and history of normal results.  Physical exam overall unremarkable except as noted above. Routine lab work ordered as noted.  - Patient declined mammogram - Discuss shingles vaccine - No further COVID-19 vaccinations per her preference

## 2023-05-30 NOTE — Assessment & Plan Note (Signed)
 Reports fatigue, lack of energy, and apathy. On escitalopram 20 mg, which has been somewhat effective. Discussed adding aripiprazole (2 mg) as an adjunct, but she prefers to continue current medication due to renal concerns. Encouraged outdoor activities and chair exercises to improve mood and energy levels. - Continue escitalopram 20 mg daily - Encourage outdoor activities and chair exercises

## 2023-05-31 ENCOUNTER — Telehealth: Payer: Self-pay

## 2023-05-31 DIAGNOSIS — R0609 Other forms of dyspnea: Secondary | ICD-10-CM

## 2023-05-31 NOTE — Telephone Encounter (Signed)
 Copied from CRM [id number]. Topic: Referral - Request for Referral >> May 31, 2023  3:43 PM Dyann Kief wrote: Did the patient discuss referral with their provider in the last year? Yes (If No - schedule appointment) (If Yes - send message)  Appointment offered? No  Type of order/referral and detailed reason for visit: Pt stated she would like to get a referral for a lung specialist as discussed during her last visit with Dr. Payton Mccallum.  Preference of office, provider, location: first available, in network  If referral order, have you been seen by this specialty before? No (If Yes, this issue or another issue? When? Where?  Can we respond through MyChart? No, Pt would like a call [phone]

## 2023-06-10 DIAGNOSIS — E113393 Type 2 diabetes mellitus with moderate nonproliferative diabetic retinopathy without macular edema, bilateral: Secondary | ICD-10-CM | POA: Diagnosis not present

## 2023-06-10 LAB — HM DIABETES EYE EXAM

## 2023-06-18 ENCOUNTER — Telehealth: Payer: Self-pay

## 2023-06-18 NOTE — Telephone Encounter (Signed)
 Copied from CRM [id number]. Topic: Clinical - Prescription Issue >> Jun 18, 2023  4:55 PM Albin Felling L wrote: Reason for CRM: Patient following up on request for assistance in getting the Ozempic covered.  Patient states it was covered last year and was approved through the company.   Patient's next dose will be 06/24/2023 and patient will be out by then.   Please assist patient further   If needing to be called in patient requesting rx be sent to [facility]

## 2023-06-19 ENCOUNTER — Telehealth: Payer: Self-pay | Admitting: Family Medicine

## 2023-06-19 NOTE — Telephone Encounter (Signed)
 Copied from CRM [id number]. Topic: Clinical - Medication Question >> Jun 19, 2023  1:26 PM Everette C wrote: Reason for CRM: The patient has called to share that they have reached out to their insurance representative and been affirmed that their prescription for Semaglutide,0.25 or 0.5MG /DOS, (OZEMPIC, 0.25 OR 0.5 MG/DOSE,) 2 MG/3ML SOPN [[id number]] will be covered this year and they will not need to utilize their patient assistance   Please contact the patient furthe if needed

## 2023-06-19 NOTE — Telephone Encounter (Signed)
 I do apologize. Forwarding to correct practice.

## 2023-06-24 NOTE — Telephone Encounter (Signed)
 Mailbox full unable to leave voicemail Ok for E2C2 to verify what the patients current does is and if she needs prescription sent per Dr.[name]

## 2023-06-25 ENCOUNTER — Encounter: Payer: Self-pay | Admitting: Pulmonary Disease

## 2023-06-25 ENCOUNTER — Ambulatory Visit: Admitting: Pulmonary Disease

## 2023-06-25 VITALS — BP 124/80 | HR 78 | Temp 97.6°F | Ht 63.0 in | Wt 186.0 lb

## 2023-06-25 DIAGNOSIS — N184 Chronic kidney disease, stage 4 (severe): Secondary | ICD-10-CM

## 2023-06-25 DIAGNOSIS — Z794 Long term (current) use of insulin: Secondary | ICD-10-CM | POA: Diagnosis not present

## 2023-06-25 DIAGNOSIS — E1122 Type 2 diabetes mellitus with diabetic chronic kidney disease: Secondary | ICD-10-CM

## 2023-06-25 DIAGNOSIS — E213 Hyperparathyroidism, unspecified: Secondary | ICD-10-CM

## 2023-06-25 DIAGNOSIS — R0602 Shortness of breath: Secondary | ICD-10-CM | POA: Diagnosis not present

## 2023-06-25 DIAGNOSIS — C9000 Multiple myeloma not having achieved remission: Secondary | ICD-10-CM

## 2023-06-25 NOTE — Patient Instructions (Signed)
 VISIT SUMMARY:  Today, we discussed your ongoing fatigue and shortness of breath, which you have been experiencing for about a year. We reviewed your history of diabetes, kidney problems, multiple myeloma, and elevated parathyroid hormone levels. We also talked about your recent gym activities and current medications.  YOUR PLAN:  -DYSPNEA AND FATIGUE: Dyspnea means difficulty breathing, and fatigue means feeling very tired. These symptoms may be related to your chronic kidney disease and multiple myeloma. We will do a chest X-ray, pulmonary function tests, and an echocardiogram to check your lungs and heart.  -MULTIPLE MYELOMA: Multiple myeloma is a type of cancer that affects your bone marrow and can cause fatigue and other symptoms. Your condition is being managed, but it may be making your symptoms worse.  -CHRONIC KIDNEY DISEASE: Chronic kidney disease means your kidneys are not working as well as they should. This is often caused by diabetes. Your kidney function has improved from 17% to 29%, and Dr. Thedore Mins is managing this condition.  -HYPERPARATHYROIDISM: Hyperparathyroidism means you have high levels of parathyroid hormone, which can cause fatigue and difficulty breathing. Dr. Thedore Mins will evaluate this further, and treatment may include medication or surgery.  -HYPOTHYROIDISM: Hypothyroidism means your thyroid gland is not making enough thyroid hormone. You are taking levothyroxine to manage this condition.  INSTRUCTIONS:  Please complete the chest X-ray, pulmonary function tests, and echocardiogram as scheduled. After these tests are done, schedule a follow-up appointment in 4-6 weeks to discuss the results and next steps.

## 2023-06-25 NOTE — Progress Notes (Signed)
 Subjective:    Patient ID: Donna Malone, female    DOB: [birth year], 78 y.o.   MRN: [id number]  Patient Care Team: Sherlyn Hay, DO as PCP - General (Family Medicine) Irene Limbo., MD as Consulting Physician (Ophthalmology) Lyndle Herrlich, MD as Consulting Physician (Orthopedic Surgery) Mosetta Pigeon, MD (Nephrology) Rickard Patience, MD as Consulting Physician (Oncology)  Chief Complaint  Patient presents with   Consult    Shortness of breath on exertion x several months. No cough or wheezing.     BACKGROUND: Ms. Donna Malone is a 78 year old lifelong never smoker who presents for evaluation of shortness of breath.  She also has fatigue.  She is kindly referred by Dr. Jacquenette Shone.   HPI Discussed the use of AI scribe software for clinical note transcription with the patient, who gave verbal consent to proceed.  History of Present Illness   Donna Malone is a 78 year old female with diabetes and kidney problems who presents with fatigue and shortness of breath of over a years duration but worse over the last several months.  She experiences significant fatigue and shortness of breath, particularly during activities such as getting out of bed or sweeping the floor. These symptoms have persisted for approximately one year.  She has a long-standing history of diabetes, diagnosed over thirty to forty years ago, and kidney problems with fluctuating kidney function. Her kidney function once decreased to seventeen but has since improved to twenty-nine (GFR).  She has been diagnosed with multiple myeloma and has elevated parathyroid hormone (PTH) levels, which have not been further investigated.  She recently started going to the gym twice a week, engaging in activities such as riding a bicycle for a mile and performing various exercises. While her legs become weak, her breathing remains relatively stable during these activities. She takes levothyroxine for thyroid management and  trazodone for sleep. No history of smoking or childhood asthma, and no current issues with snoring.  No significant occupational history in the past.  She does not endorse any fevers, chills or sweats.  No cough or sputum production.  No wheezing.  No orthopnea or paroxysmal nocturnal dyspnea.  She does not endorse any chest pain.     Review of Systems A 10 point review of systems was performed and it is as noted above otherwise negative.   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   Need for tetanus booster 05/04/2021   Stage 3b chronic kidney disease (HCC) 07/13/2021    Past Surgical History:  Procedure Laterality Date   ABDOMINAL HYSTERECTOMY  04/1970   partial   CHOLECYSTECTOMY  late 1990's   COLONOSCOPY WITH PROPOFOL N/A 10/09/2021   Procedure: COLONOSCOPY WITH PROPOFOL;  Surgeon: Wyline Mood, MD;  Location: [facility] ENDOSCOPY;  Service: Gastroenterology;  Laterality: N/A;   COLONOSCOPY WITH PROPOFOL N/A 11/23/2022   Procedure: COLONOSCOPY WITH PROPOFOL;  Surgeon: Wyline Mood, MD;  Location: [facility] ENDOSCOPY;  Service: Gastroenterology;  Laterality: N/A;   LAPAROSCOPIC APPENDECTOMY N/A 03/14/2022   Procedure: APPENDECTOMY LAPAROSCOPIC;  Surgeon: Henrene Dodge, MD;  Location: [facility] ORS;  Service: General;  Laterality: N/A;   POLYPECTOMY  11/23/2022   Procedure: POLYPECTOMY;  Surgeon: Wyline Mood, MD;  Location: [facility] ENDOSCOPY;  Service: Gastroenterology;;    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    Pelvic floor relaxation 11/02/2022   Lumbar spondylolysis 11/02/2022   Degenerative disc disease at L5-S1 level 11/02/2022   Degeneration of L4-L5 intervertebral disc 11/02/2022   Umbilical hernia without obstruction and without gangrene 11/02/2022   Right hip pain 11/02/2022   Anemia 11/02/2022   Annual physical exam 05/01/2022   Appendicitis 03/14/2022   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   Positive colorectal cancer screening using Cologuard test 08/29/2021   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   Yeast infection of the skin 08/02/2021   Panniculitis 08/02/2021   Hypotension 08/02/2021   Stage 4 chronic kidney disease (HCC) 07/28/2021   Goals of care, counseling/discussion 07/13/2021   Multiple myeloma (HCC) 07/13/2021   Bone lesion 07/13/2021   Type 2 diabetes mellitus with stage 4 chronic kidney disease, with long-term current use of insulin (HCC) 05/05/2021   Positive screening for depression on 2-item Patient Health Questionnaire (PHQ-2) 05/04/2021   Psychophysiological insomnia 05/04/2021   Diabetes mellitus due to underlying condition with stage 4 chronic kidney disease, with long-term current use of insulin (HCC) 05/04/2021   Hyperlipidemia associated with type 2 diabetes mellitus (HCC) 05/04/2021   Need for influenza vaccination 05/04/2021   Need for shingles vaccine 05/04/2021   Right arm pain 10/22/2019   Degenerative arthritis of knee, bilateral 05/13/2018   Hypertension associated with diabetes (HCC) 04/01/2015   Leukocytosis 12/22/2014   Abnormal finding on mammography 09/21/2014   Obesity 09/21/2014   Accumulation of fluid in tissues 09/21/2014   Esophageal reflux 09/21/2014   Gout 09/21/2014   Hypercholesteremia 09/21/2014   Insomnia 09/21/2014   Vitamin D deficiency, unspecified 09/21/2014   Hypothyroidism 01/15/2014   Low back pain 01/15/2014   Other chest pain 01/15/2014   Depression,  recurrent (HCC) 01/15/2014    Family History  Problem Relation Age of Onset   CAD Mother    Heart attack Mother    Heart disease Father    Diabetes Cousin    Cancer Cousin     Social History   Tobacco Use   Smoking status: Never   Smokeless tobacco: Never  Substance Use Topics   Alcohol use: No    Allergies  Allergen Reactions   Sulfa Antibiotics Other (See Comments)    Current Meds  Medication Sig   Accu-Chek Softclix Lancets lancets Use as instructed   acetaminophen (TYLENOL) 500 MG tablet Take 500 mg by mouth every 6 (six) hours as needed for mild pain or headache.   allopurinol (ZYLOPRIM) 100 MG tablet TAKE 1 TABLET BY MOUTH EVERY DAY   atorvastatin (LIPITOR) 40 MG tablet TAKE 1 TABLET BY MOUTH EVERY DAY   B Complex Vitamins (GNP VITAMIN B COMPLEX PO) Take 1 tablet by mouth daily.   blood glucose meter kit and supplies Dispense based on patient and insurance preference. Use once daily for FBG. (FOR ICD-10 E10.9, E11.9).   Blood Glucose Monitoring Suppl DEVI 1 each by Does not apply route in the morning, at noon, and at bedtime. May substitute to any manufacturer covered by patient's insurance.   cetirizine (ZYRTEC) 10 MG tablet Take 10 mg by mouth daily.   escitalopram (LEXAPRO) 20 MG tablet Take 1  tablet (20 mg total) by mouth daily.   famotidine (PEPCID) 40 MG tablet Take 1 tablet (40 mg total) by mouth daily.   glucose blood (ACCU-CHEK GUIDE) test strip Use as instructed   KRILL OIL PO Take by mouth.   levothyroxine (SYNTHROID) 75 MCG tablet Take 1 tablet (75 mcg total) by mouth daily before breakfast.   MAGnesium-Oxide 400 (240 Mg) MG tablet Take 1 tablet (400 mg total) by mouth daily.   meclizine (ANTIVERT) 25 MG tablet Take 1-2 tablets (25-50 mg total) by mouth 3 (three) times daily as needed for dizziness.   Multiple Vitamins-Minerals (PRESERVISION AREDS 2) CAPS Take 1 capsule by mouth in the morning and at bedtime.   ONETOUCH VERIO test strip USE IN THE  MORNING, AT NOON, AND AT BEDTIME.   Polyethyl Glycol-Propyl Glycol (SYSTANE HYDRATION PF OP) Place 1 drop into both eyes daily as needed (for dry eyes).   traZODone (DESYREL) 50 MG tablet TAKE 1 TABLET BY MOUTH EVERYDAY AT BEDTIME   Vitamin D, Ergocalciferol, (DRISDOL) 1.25 MG (50000 UNIT) CAPS capsule TAKE 1 CAPSULE (50,000 UNITS TOTAL) BY MOUTH EVERY 7 (SEVEN) DAYS. SUNDAY   [DISCONTINUED] Semaglutide,0.25 or 0.5MG /DOS, (OZEMPIC, 0.25 OR 0.5 MG/DOSE,) 2 MG/3ML SOPN Inject 0.5 mg as directed per week    Immunization History  Administered Date(s) Administered   Fluad Quad(high Dose 65+) 04/20/2019, 05/04/2021   Influenza Split 12/16/2009, 03/21/2011, 01/30/2012   Influenza, High Dose Seasonal PF 02/09/2015, 03/20/2016, 12/24/2016, 02/07/2018, 01/28/2023   Influenza,inj,Quad PF,6+ Mos 02/06/2013, 12/03/2013   PFIZER(Purple Top)SARS-COV-2 Vaccination 06/19/2019, 07/14/2019, 03/18/2020   Pneumococcal Conjugate-13 12/03/2013   Pneumococcal Polysaccharide-23 03/21/2011   Tdap 04/09/2008, 05/04/2021   Zoster Recombinant(Shingrix) 05/04/2021        Objective:     BP 124/80 (BP Location: Left Arm, Patient Position: Sitting, Cuff Size: Normal)   Pulse 78   Temp 97.6 F (36.4 C) (Temporal)   Ht 5\' 3"  (1.6 m)   Wt 186 lb (84.4 kg)   SpO2 95%   BMI 32.95 kg/m   SpO2: 95 %  GENERAL: Overweight woman, no acute distress, fully ambulatory.  No conversational dyspnea. HEAD: Normocephalic, atraumatic.  EYES: Pupils equal, round, reactive to light.  No scleral icterus.  MOUTH: Wears upper dentures, oral mucosa moist.  No thrush. NECK: Supple. No thyromegaly. Trachea midline. No JVD.  No adenopathy. PULMONARY: Good air entry bilaterally.  No adventitious sounds. CARDIOVASCULAR: S1 and S2. Regular rate and rhythm.  No rubs, murmurs or gallops heard. ABDOMEN: Obese, otherwise benign. MUSCULOSKELETAL: No joint deformity, no clubbing, no edema.  NEUROLOGIC: No overt focal deficit, no gait  disturbance, speech is fluent. SKIN: Intact,warm,dry. PSYCH: Mood and behavior normal.  Ambulatory oxymetry was performed today:  At rest on room air oxygen saturation was 97% , the patient ambulated at a slow pace, completed 3 laps, O2 nadi 94%, moderate shortness of breath.  Resting heart rate was 3 bpm at maximum for this exercise 104 bpm.  No evidence of significant O2 desaturation.  After achieving nadir oxygen increased again to 95%.      Assessment & Plan:     ICD-10-CM   1. Shortness of breath  R06.02 DG Chest 2 View    Pulmonary Function Test [facility] Only    ECHOCARDIOGRAM COMPLETE    2. Stage 4 chronic kidney disease (HCC)  N18.4     3. Hyperparathyroidism (HCC)  E21.3     4. Type 2 diabetes mellitus with stage 4 chronic kidney disease, with long-term current use  of insulin (HCC)  E11.22    N18.4    Z79.4     5. Multiple myeloma, remission status unspecified (HCC)  C90.00       Orders Placed This Encounter  Procedures   DG Chest 2 View    Standing Status:   Future    Expiration Date:   06/24/2024    Reason for Exam (SYMPTOM  OR DIAGNOSIS REQUIRED):   Shortness of breath    Preferred imaging location?:   [facility]   Pulmonary Function Test [facility] Only    Standing Status:   Future    Expected Date:   07/09/2023    Expiration Date:   06/24/2024    Full PFT: includes the following: basic spirometry, spirometry pre & post bronchodilator, diffusion capacity (DLCO), lung volumes:   Full PFT    This test can only be performed at:   [facility]   ECHOCARDIOGRAM COMPLETE    Standing Status:   Future    Expected Date:   07/02/2023    Expiration Date:   06/24/2024    Where should this test be performed:   [facility]    Please indicate who you request to read the nuc med / echo results.:   [facility] Readers    Perflutren DEFINITY (image enhancing agent) should be administered unless hypersensitivity or allergy exist:   Administer Perflutren    Reason for  exam-Echo:   Dyspnea  R06.00    Discussion:    Dyspnea and Fatigue Reports dyspnea and fatigue for approximately one year, particularly during activities such as getting out of bed or sweeping the floor. Symptoms may be related to underlying conditions, including chronic kidney disease and multiple myeloma. Oxygen levels remained stable during a walking test, suggesting a non-primary lung issue. Further evaluation is necessary to rule out cardiac and pulmonary causes. - Order chest X-ray to initiate evaluation - Schedule pulmonary function tests to assess breathing - Schedule echocardiogram to evaluate cardiac function and potential cardiac sources of dyspnea  Multiple Myeloma Multiple myeloma may contribute to fatigue and systemic symptoms. Condition is managed but may exacerbate current symptoms.  Chronic Kidney Disease Diabetes contributes to chronic kidney disease. Kidney function has fluctuated, with a previous low of 17 mL/min and a current level of 29 mL/min. Condition is managed by nephrologist, Dr. Thedore Mins.  Hyperparathyroidism Elevated parathyroid hormone (PTH) levels may contribute to fatigue and dyspnea. Further evaluation by nephrologist, Dr. Thedore Mins, is recommended. Potential interventions include medical management or surgical removal of parathyroid glands.  Hypothyroidism On levothyroxine for hypothyroidism, indicating thyroid dysfunction is managed with medication.  Follow-up Plan to follow up after completing diagnostic tests to evaluate symptoms and underlying conditions. - Schedule follow-up appointment in 4-6 weeks after completion of tests     Advised if symptoms do not improve or worsen, to please contact office for sooner follow up or seek emergency care.    I spent 60 minutes of dedicated to the care of this patient on the date of this encounter to include pre-visit review of records, face-to-face time with the patient discussing conditions above, post visit  ordering of testing, clinical documentation with the electronic health record, making appropriate referrals as documented, and communicating necessary findings to members of the patients care team.   C. Danice Goltz, MD Advanced Bronchoscopy P[facility]    *This note was dictated using voice recognition software/Dragon.  Despite best efforts to proofread, errors can occur which can change the meaning. Any transcriptional errors  that result from this process are unintentional and may not be fully corrected at the time of dictation.

## 2023-06-26 ENCOUNTER — Other Ambulatory Visit: Payer: Self-pay

## 2023-06-26 ENCOUNTER — Telehealth: Payer: Self-pay

## 2023-06-26 DIAGNOSIS — M5136 Other intervertebral disc degeneration, lumbar region with discogenic back pain only: Secondary | ICD-10-CM | POA: Diagnosis not present

## 2023-06-26 DIAGNOSIS — M9905 Segmental and somatic dysfunction of pelvic region: Secondary | ICD-10-CM | POA: Diagnosis not present

## 2023-06-26 DIAGNOSIS — M5416 Radiculopathy, lumbar region: Secondary | ICD-10-CM | POA: Diagnosis not present

## 2023-06-26 DIAGNOSIS — M9903 Segmental and somatic dysfunction of lumbar region: Secondary | ICD-10-CM | POA: Diagnosis not present

## 2023-06-26 NOTE — Telephone Encounter (Signed)
 Copied from CRM [id number]. Topic: Clinical - Medication Question >> Jun 26, 2023  2:46 PM Priscille Loveless wrote: Reason for CRM: Pt called and wanted to know the status of her ozempic and when it will be refilled. She is out and has missed a shot. She has completed the 0.25 and .5 and is ready for the next level.

## 2023-06-26 NOTE — Telephone Encounter (Signed)
 Per patient she has been on the 1.5 mg and feels like she need to go to the next dose. Pharmacy: [facility]

## 2023-06-27 ENCOUNTER — Other Ambulatory Visit: Payer: Self-pay

## 2023-06-27 MED ORDER — SEMAGLUTIDE (1 MG/DOSE) 4 MG/3ML ~~LOC~~ SOPN
1.0000 mg | PEN_INJECTOR | SUBCUTANEOUS | 0 refills | Status: DC
  Filled 2023-06-27: qty 3, 28d supply, fill #0
  Filled 2023-07-24 – 2023-07-25 (×2): qty 3, 28d supply, fill #1
  Filled 2023-08-18: qty 3, 28d supply, fill #2

## 2023-06-27 NOTE — Telephone Encounter (Signed)
 Called and spoke with patient. Pt stated she thought she was taking 1.5mg  but verified  that she is taking the 0.5mg  and would like to go up to 1.0mg .

## 2023-06-27 NOTE — Addendum Note (Signed)
 Addended by: Lorelei Pont D on: 06/27/2023 10:56 AM   Modules accepted: Orders

## 2023-06-28 ENCOUNTER — Other Ambulatory Visit: Payer: Self-pay

## 2023-07-01 NOTE — Telephone Encounter (Signed)
 Called and spoke to pt. Pt stated she has already picked up ozempic.

## 2023-07-03 ENCOUNTER — Ambulatory Visit
Admission: RE | Admit: 2023-07-03 | Discharge: 2023-07-03 | Disposition: A | Discharge status: Home / Self Care | Source: Ambulatory Visit | Attending: Oncology | Admitting: Oncology

## 2023-07-03 DIAGNOSIS — C9000 Multiple myeloma not having achieved remission: Secondary | ICD-10-CM

## 2023-07-03 DIAGNOSIS — M51369 Other intervertebral disc degeneration, lumbar region without mention of lumbar back pain or lower extremity pain: Secondary | ICD-10-CM | POA: Diagnosis not present

## 2023-07-05 ENCOUNTER — Other Ambulatory Visit: Payer: Self-pay

## 2023-07-05 ENCOUNTER — Emergency Department

## 2023-07-05 ENCOUNTER — Emergency Department
Admission: EM | Admit: 2023-07-05 | Discharge: 2023-07-05 | Disposition: A | Discharge status: Home / Self Care | Attending: Emergency Medicine | Admitting: Emergency Medicine

## 2023-07-05 DIAGNOSIS — S3991XA Unspecified injury of abdomen, initial encounter: Secondary | ICD-10-CM | POA: Diagnosis not present

## 2023-07-05 DIAGNOSIS — N189 Chronic kidney disease, unspecified: Secondary | ICD-10-CM | POA: Insufficient documentation

## 2023-07-05 DIAGNOSIS — S0993XA Unspecified injury of face, initial encounter: Secondary | ICD-10-CM | POA: Diagnosis present

## 2023-07-05 DIAGNOSIS — Z043 Encounter for examination and observation following other accident: Secondary | ICD-10-CM | POA: Diagnosis not present

## 2023-07-05 DIAGNOSIS — R1010 Upper abdominal pain, unspecified: Secondary | ICD-10-CM | POA: Insufficient documentation

## 2023-07-05 DIAGNOSIS — R9389 Abnormal findings on diagnostic imaging of other specified body structures: Secondary | ICD-10-CM | POA: Diagnosis not present

## 2023-07-05 DIAGNOSIS — E1122 Type 2 diabetes mellitus with diabetic chronic kidney disease: Secondary | ICD-10-CM | POA: Diagnosis not present

## 2023-07-05 DIAGNOSIS — S01112A Laceration without foreign body of left eyelid and periocular area, initial encounter: Secondary | ICD-10-CM | POA: Diagnosis not present

## 2023-07-05 DIAGNOSIS — S299XXA Unspecified injury of thorax, initial encounter: Secondary | ICD-10-CM | POA: Diagnosis not present

## 2023-07-05 DIAGNOSIS — R0789 Other chest pain: Secondary | ICD-10-CM | POA: Insufficient documentation

## 2023-07-05 DIAGNOSIS — S0181XA Laceration without foreign body of other part of head, initial encounter: Secondary | ICD-10-CM | POA: Diagnosis not present

## 2023-07-05 DIAGNOSIS — M7989 Other specified soft tissue disorders: Secondary | ICD-10-CM | POA: Diagnosis not present

## 2023-07-05 DIAGNOSIS — W109XXA Fall (on) (from) unspecified stairs and steps, initial encounter: Secondary | ICD-10-CM | POA: Diagnosis not present

## 2023-07-05 DIAGNOSIS — S0083XA Contusion of other part of head, initial encounter: Secondary | ICD-10-CM | POA: Diagnosis not present

## 2023-07-05 DIAGNOSIS — R220 Localized swelling, mass and lump, head: Secondary | ICD-10-CM | POA: Diagnosis not present

## 2023-07-05 DIAGNOSIS — S3993XA Unspecified injury of pelvis, initial encounter: Secondary | ICD-10-CM | POA: Diagnosis not present

## 2023-07-05 DIAGNOSIS — S065X0A Traumatic subdural hemorrhage without loss of consciousness, initial encounter: Secondary | ICD-10-CM | POA: Diagnosis not present

## 2023-07-05 DIAGNOSIS — S065XAA Traumatic subdural hemorrhage with loss of consciousness status unknown, initial encounter: Secondary | ICD-10-CM

## 2023-07-05 DIAGNOSIS — R9431 Abnormal electrocardiogram [ECG] [EKG]: Secondary | ICD-10-CM | POA: Diagnosis not present

## 2023-07-05 DIAGNOSIS — R9082 White matter disease, unspecified: Secondary | ICD-10-CM | POA: Diagnosis not present

## 2023-07-05 DIAGNOSIS — M4802 Spinal stenosis, cervical region: Secondary | ICD-10-CM | POA: Diagnosis not present

## 2023-07-05 DIAGNOSIS — M4312 Spondylolisthesis, cervical region: Secondary | ICD-10-CM | POA: Diagnosis not present

## 2023-07-05 DIAGNOSIS — R079 Chest pain, unspecified: Secondary | ICD-10-CM | POA: Diagnosis not present

## 2023-07-05 LAB — TROPONIN I (HIGH SENSITIVITY)
Troponin I (High Sensitivity): 4 ng/L (ref <18)
Troponin I (High Sensitivity): 4 ng/L (ref <18)

## 2023-07-05 LAB — BASIC METABOLIC PANEL WITH GFR
Anion gap: 13 (ref 5–15)
BUN: 33 mg/dL — ABNORMAL HIGH (ref 8–23)
CO2: 16 mmol/L — ABNORMAL LOW (ref 22–32)
Calcium: 9.5 mg/dL (ref 8.9–10.3)
Chloride: 110 mmol/L (ref 98–111)
Creatinine, Ser: 2.11 mg/dL — ABNORMAL HIGH (ref 0.44–1.00)
GFR, Estimated: 23 mL/min — ABNORMAL LOW (ref >60)
Glucose, Bld: 114 mg/dL — ABNORMAL HIGH (ref 70–99)
Potassium: 4.2 mmol/L (ref 3.5–5.1)
Sodium: 139 mmol/L (ref 135–145)

## 2023-07-05 LAB — CBC
HCT: 34.2 % — ABNORMAL LOW (ref 36.0–46.0)
Hemoglobin: 11.4 g/dL — ABNORMAL LOW (ref 12.0–15.0)
MCH: 34.7 pg — ABNORMAL HIGH (ref 26.0–34.0)
MCHC: 33.3 g/dL (ref 30.0–36.0)
MCV: 104 fL — ABNORMAL HIGH (ref 80.0–100.0)
Platelets: 229 10*3/uL (ref 150–400)
RBC: 3.29 MIL/uL — ABNORMAL LOW (ref 3.87–5.11)
RDW: 13.3 % (ref 11.5–15.5)
WBC: 8.2 10*3/uL (ref 4.0–10.5)
nRBC: 0 % (ref 0.0–0.2)

## 2023-07-05 MED ORDER — HYDROCODONE-ACETAMINOPHEN 5-325 MG PO TABS: 1.0000 | ORAL_TABLET | Freq: Four times a day (QID) | ORAL | 0 refills | Status: AC | PRN

## 2023-07-05 MED ORDER — HYDROCODONE-ACETAMINOPHEN 5-325 MG PO TABS
1.0000 | ORAL_TABLET | Freq: Once | ORAL | Status: AC
  Administered 2023-07-05: 1 via ORAL
  Filled 2023-07-05: qty 1

## 2023-07-05 MED ORDER — LIDOCAINE 5 % EX PTCH
1.0000 | MEDICATED_PATCH | Freq: Once | CUTANEOUS | Status: DC
  Administered 2023-07-05: 1 via TRANSDERMAL
  Filled 2023-07-05: qty 1

## 2023-07-05 NOTE — ED Provider Notes (Signed)
 Received notification from radiology of patient with small subdural along the falx.  Notified charge nurse, patient to be roomed shortly.   Result Date: 07/05/2023 CLINICAL DATA:  Fall down stairs. EXAM: CT HEAD WITHOUT CONTRAST TECHNIQUE: Contiguous axial images were obtained from the base of the skull through the vertex without intravenous contrast. RADIATION DOSE REDUCTION: This exam was performed according to the departmental dose-optimization program which includes automated exposure control, adjustment of the mA and/or kV according to patient size and/or use of iterative reconstruction technique. COMPARISON:  CT head without contrast 11/07/2021 FINDINGS: Brain: A 2.5 mm subdural hemorrhage is noted along the left side of the anterior inter cerebral falx. No parenchymal hemorrhage is present. Moderate generalized atrophy and white matter disease is again seen bilaterally. The ventricles are proportionate to the degree of atrophy. A 6 mm locule of fat is again noted along the left choroid plexus. Deep brain nuclei are within normal limits. The brainstem and cerebellum are within normal limits. Midline structures are within normal limits. Vascular: Atherosclerotic calcifications are present within the cavernous internal carotid arteries bilaterally and at the dural margin of the left vertebral artery. No hyperdense vessel is present. Skull: Left supraorbital scalp soft tissue swelling is present. No underlying fracture or foreign body is present. Calvarium is intact. No focal lytic or blastic lesions are present. The extracranial soft tissues are otherwise within normal limits. Sinuses/Orbits: The paranasal sinuses and mastoid air cells are clear. Bilateral lens replacements are noted. Globes and orbits are otherwise unremarkable. IMPRESSION: 1. 2.5 mm subdural hemorrhage along the left side of the anterior inter cerebral falx. 2. Left supraorbital scalp soft tissue swelling without underlying fracture or  foreign body. 3. Stable moderate generalized atrophy and white matter disease. This likely reflects the sequela of chronic microvascular ischemia. Electronically Signed: By: Marin Roberts M.D. On: 07/05/2023 14:01    Sharyn Creamer, MD 07/05/23 1451

## 2023-07-05 NOTE — ED Provider Notes (Signed)
 [facility] Provider Note    Event Date/Time   First MD Initiated Contact with Patient 07/05/23 1504     (approximate)   History   Fall   HPI  Donna Malone is a 78 year old female with history of diabetes, CKD presenting to the emergency department for evaluation after a fall.  Patient was walking down the stairs with 2 bags of food in her hand when she slipped and fell forward and hit her head.  No LOC.  No preceding chest pain, shortness of breath, lightheadedness.  Currently reports pain over her face as well as over her chest wall and upper abdomen.  Not on anticoagulation.  No numbness, tingling, focal weakness.      Physical Exam   Triage Vital Signs: ED Triage Vitals [07/05/23 1233]  Encounter Vitals Group     BP 96/77     Systolic BP Percentile      Diastolic BP Percentile      Pulse Rate 77     Resp 20     Temp 98 F (36.7 C)     Temp Source Oral     SpO2 98 %     Weight      Height      Head Circumference      Peak Flow      Pain Score 7     Pain Loc      Pain Education      Exclude from Growth Chart     Most recent vital signs: Vitals:   07/05/23 1625 07/05/23 1826  BP:  (!) 165/74  Pulse:  69  Resp:  18  Temp:  97.6 F (36.4 C)  SpO2: 100% 98%   Nursing notes and vital signs reviewed.  General: Adult female, laying in bed, awake and interactive Head: Dried blood over the face with small 1 cm laceration along the left medial eyebrow  Neck: No midline tenderness Chest: Symmetric chest rise, tenderness palpation over the left lower anterior chest wall into the the left upper abdomen Cardiac: Regular rhythm and rate.  Respiratory: Lungs clear to auscultation Abdomen: Soft, nondistended.  Upper normal tenderness on the left as above. Pelvis: Stable in AP and lateral compression. No tenderness to palpation. MSK: No deformity to bilateral upper and lower extremity. Full range of motion to bilateral upper lower  extremity with no pain. Neuro: Alert and oriented, normal extraocular movements, symmetric facial movement, sensation intact over bilateral upper and lower extremities with 5 out of 5 strength.  Normal finger-to-nose testing. Skin: No evidence of burns or lacerations.   ED Results / Procedures / Treatments   Labs (all labs ordered are listed, but only abnormal results are displayed) Labs Reviewed  BASIC METABOLIC PANEL WITH GFR - Abnormal; Notable for the following components:      Result Value   CO2 16 (*)    Glucose, Bld 114 (*)    BUN 33 (*)    Creatinine, Ser 2.11 (*)    GFR, Estimated 23 (*)    All other components within normal limits  CBC - Abnormal; Notable for the following components:   RBC 3.29 (*)    Hemoglobin 11.4 (*)    HCT 34.2 (*)    MCV 104.0 (*)    MCH 34.7 (*)    All other components within normal limits  TROPONIN I (HIGH SENSITIVITY)  TROPONIN I (HIGH SENSITIVITY)     EKG EKG independently reviewed interpreted by myself (ER attending)  demonstrates:  EKG demonstrates normal sinus rhythm rate of 72, PR 186 QRS 86, QTc 428, no acute ST changes  RADIOLOGY Imaging independently reviewed and interpreted by myself demonstrates:  CXR without focal findings CT C-spine without acute fracture CT max face with scalp hematoma without underlying fracture CT head with 2.5 mm subdural hemorrhage along the anterior falx CT chest abdomen pelvis without acute traumatic injuries Repeat CT head with unchanged 2 mm subdural hematoma  PROCEDURES:  Critical Care performed: No  .Laceration Repair  Date/Time: 07/05/2023 8:47 PM  Performed by: Trinna Post, MD Authorized by: Trinna Post, MD   Consent:    Consent obtained:  Verbal   Consent given by:  Patient   Risks, benefits, and alternatives were discussed: yes   Laceration details:    Location:  Face   Face location:  L eyebrow   Length (cm):  1 Treatment:    Area cleansed with:  Saline   Irrigation solution:   Sterile saline Skin repair:    Repair method:  Tissue adhesive Repair type:    Repair type:  Simple Post-procedure details:    Dressing:  Open (no dressing)   Procedure completion:  Tolerated    MEDICATIONS ORDERED IN ED: Medications  lidocaine (LIDODERM) 5 % 1 patch (1 patch Transdermal Patch Applied 07/05/23 1821)  HYDROcodone-acetaminophen (NORCO/VICODIN) 5-325 MG per tablet 1 tablet (1 tablet Oral Given 07/05/23 1822)     IMPRESSION / MDM / ASSESSMENT AND PLAN / ED COURSE  I reviewed the triage vital signs and the nursing notes.  Differential diagnosis includes, but is not limited to, intracranial bleed, skull fracture, facial fracture, rib fracture, pneumothorax, intra-abdominal injury  Patient's presentation is most consistent with acute presentation with potential threat to life or bodily function.  78 year old female presenting after ground-level fall with head trauma.  Stable vitals.  Labs with stable anemia.  BMP with an creatinine, slightly worsened from prior.  Chest pain seems most consistent with musculoskeletal pain, but reassuring EKG and negative troponin x 2.    Imaging demonstrated small subdural hemorrhage.  Reviewed with Dr. Emogene Morgan who recommended repeat head CT at 6 hours at which time patient could be discharged if stable.  Patient was given Norco and lidocaine patch for her pain over her left chest wall.  Did report improvement with this.  Repeat head CT did return with stable findings.  Remains well-appearing without any complaints on reevaluation.  She is agreeable with discharge and outpatient follow-up.  Will DC with short course of pain medication.  Strict return precautions provided.  Patient discharged in stable condition.      FINAL CLINICAL IMPRESSION(S) / ED DIAGNOSES   Final diagnoses:  Subdural hematoma (HCC)  Facial laceration, initial encounter     Rx / DC Orders   ED Discharge Orders          Ordered    HYDROcodone-acetaminophen  (NORCO/VICODIN) 5-325 MG tablet  Every 6 hours PRN        07/05/23 2050             Note:  This document was prepared using Dragon voice recognition software and may include unintentional dictation errors.   Trinna Post, MD 07/05/23 [phone]

## 2023-07-05 NOTE — ED Triage Notes (Signed)
 Pt to ED via POV from home. Pt reports was going down steps with food in her hand and unsure how she fell and fell forward. Pt has abrasion to left eyebrow. Pt reports HA, CP, SOB. Pt denies blood thinners.

## 2023-07-05 NOTE — Discharge Instructions (Addendum)
 You were seen in the ER today for evaluation after your fall.  You did have a small brain bleed, but fortunately this was stable when we repeated your CT.  Please follow-up with neurosurgery for further evaluation.  I suspect your pain on your left side is related to musculoskeletal pain.  You can take Tylenol to help with your pain.  You can also use over-the-counter lidocaine patches.  I sent a short course of narcotic pain medicine to your pharmacy for breakthrough pain.  Do not drive or operate machinery when taking this.  Return to the ER for new or worsening symptoms.

## 2023-07-08 ENCOUNTER — Telehealth: Payer: Self-pay | Admitting: Neurosurgery

## 2023-07-08 ENCOUNTER — Other Ambulatory Visit: Payer: Self-pay

## 2023-07-08 ENCOUNTER — Telehealth: Payer: Self-pay

## 2023-07-08 DIAGNOSIS — S065XAA Traumatic subdural hemorrhage with loss of consciousness status unknown, initial encounter: Secondary | ICD-10-CM

## 2023-07-08 NOTE — Telephone Encounter (Signed)
 Patient seen in ED on 07/05/23 for subdural hematoma. Will she need a new scan? When should she be seen?

## 2023-07-08 NOTE — Telephone Encounter (Signed)
 Copied from CRM [id number]. Topic: Clinical - Medical Advice >> Jul 08, 2023 11:58 AM Georgeanna Harrison H wrote: Reason for CRM: Pt took a fall over the weekend and now has to see a Neurosurgeon and would like to speak with her doctor/nurse.

## 2023-07-09 NOTE — Telephone Encounter (Signed)
 Patient notified

## 2023-07-09 NOTE — Telephone Encounter (Signed)
 CT 07/23/23 Appt 07/30/23

## 2023-07-10 NOTE — Telephone Encounter (Signed)
 Patient reports that she no longer is concern about the fall. Per patient she is needing Ozempic to be approved. Reports that she only has 2 weeks left of medication.Received a letter saying that they need proved that she meets criteria for coverage.

## 2023-07-12 ENCOUNTER — Other Ambulatory Visit: Payer: Self-pay

## 2023-07-12 ENCOUNTER — Other Ambulatory Visit (HOSPITAL_COMMUNITY): Payer: Self-pay

## 2023-07-18 ENCOUNTER — Other Ambulatory Visit: Payer: Self-pay | Admitting: Family Medicine

## 2023-07-18 ENCOUNTER — Inpatient Hospital Stay: Payer: PPO | Attending: Oncology

## 2023-07-18 DIAGNOSIS — E038 Other specified hypothyroidism: Secondary | ICD-10-CM

## 2023-07-18 DIAGNOSIS — C9000 Multiple myeloma not having achieved remission: Secondary | ICD-10-CM | POA: Insufficient documentation

## 2023-07-18 DIAGNOSIS — N184 Chronic kidney disease, stage 4 (severe): Secondary | ICD-10-CM | POA: Insufficient documentation

## 2023-07-18 LAB — CBC WITH DIFFERENTIAL (CANCER CENTER ONLY)
Abs Immature Granulocytes: 0.03 10*3/uL (ref 0.00–0.07)
Basophils Absolute: 0.1 10*3/uL (ref 0.0–0.1)
Basophils Relative: 1 %
Eosinophils Absolute: 0.3 10*3/uL (ref 0.0–0.5)
Eosinophils Relative: 3 %
HCT: 33.4 % — ABNORMAL LOW (ref 36.0–46.0)
Hemoglobin: 11.2 g/dL — ABNORMAL LOW (ref 12.0–15.0)
Immature Granulocytes: 0 %
Lymphocytes Relative: 29 %
Lymphs Abs: 2.5 10*3/uL (ref 0.7–4.0)
MCH: 34.6 pg — ABNORMAL HIGH (ref 26.0–34.0)
MCHC: 33.5 g/dL (ref 30.0–36.0)
MCV: 103.1 fL — ABNORMAL HIGH (ref 80.0–100.0)
Monocytes Absolute: 0.7 10*3/uL (ref 0.1–1.0)
Monocytes Relative: 9 %
Neutro Abs: 4.9 10*3/uL (ref 1.7–7.7)
Neutrophils Relative %: 58 %
Platelet Count: 265 10*3/uL (ref 150–400)
RBC: 3.24 MIL/uL — ABNORMAL LOW (ref 3.87–5.11)
RDW: 13 % (ref 11.5–15.5)
WBC Count: 8.4 10*3/uL (ref 4.0–10.5)
nRBC: 0 % (ref 0.0–0.2)

## 2023-07-18 LAB — CMP (CANCER CENTER ONLY)
ALT: 15 U/L (ref 0–44)
AST: 20 U/L (ref 15–41)
Albumin: 3.4 g/dL — ABNORMAL LOW (ref 3.5–5.0)
Alkaline Phosphatase: 129 U/L — ABNORMAL HIGH (ref 38–126)
Anion gap: 10 (ref 5–15)
BUN: 26 mg/dL — ABNORMAL HIGH (ref 8–23)
CO2: 19 mmol/L — ABNORMAL LOW (ref 22–32)
Calcium: 9.2 mg/dL (ref 8.9–10.3)
Chloride: 107 mmol/L (ref 98–111)
Creatinine: 1.93 mg/dL — ABNORMAL HIGH (ref 0.44–1.00)
GFR, Estimated: 26 mL/min — ABNORMAL LOW (ref >60)
Glucose, Bld: 126 mg/dL — ABNORMAL HIGH (ref 70–99)
Potassium: 4.2 mmol/L (ref 3.5–5.1)
Sodium: 136 mmol/L (ref 135–145)
Total Bilirubin: 0.7 mg/dL (ref 0.0–1.2)
Total Protein: 6.4 g/dL — ABNORMAL LOW (ref 6.5–8.1)

## 2023-07-22 LAB — MULTIPLE MYELOMA PANEL, SERUM
Albumin SerPl Elph-Mcnc: 3.5 g/dL (ref 2.9–4.4)
Albumin/Glob SerPl: 1.2 (ref 0.7–1.7)
Alpha 1: 0.3 g/dL (ref 0.0–0.4)
Alpha2 Glob SerPl Elph-Mcnc: 1.2 g/dL — ABNORMAL HIGH (ref 0.4–1.0)
B-Globulin SerPl Elph-Mcnc: 1 g/dL (ref 0.7–1.3)
Gamma Glob SerPl Elph-Mcnc: 0.5 g/dL (ref 0.4–1.8)
Globulin, Total: 3 g/dL (ref 2.2–3.9)
IgA: 615 mg/dL — ABNORMAL HIGH (ref 64–422)
IgG (Immunoglobin G), Serum: 458 mg/dL — ABNORMAL LOW (ref 586–1602)
IgM (Immunoglobulin M), Srm: 106 mg/dL (ref 26–217)
Total Protein ELP: 6.5 g/dL (ref 6.0–8.5)

## 2023-07-22 LAB — IFE+PROTEIN ELECTRO, 24-HR UR
% BETA, Urine: 59.3 %
ALPHA 1 URINE: 2.1 %
Albumin, U: 25.1 %
Alpha 2, Urine: 6.4 %
GAMMA GLOBULIN URINE: 7.1 %
M-SPIKE %, Urine: 51.3 % — ABNORMAL HIGH
M-Spike, Mg/24 Hr: 189 mg/(24.h) — ABNORMAL HIGH
Total Protein, Urine-Ur/day: 369 mg/(24.h) — ABNORMAL HIGH (ref 30–150)
Total Protein, Urine: 20.5 mg/dL
Total Volume: 1800

## 2023-07-22 LAB — KAPPA/LAMBDA LIGHT CHAINS
Kappa free light chain: 186.9 mg/L — ABNORMAL HIGH (ref 3.3–19.4)
Kappa, lambda light chain ratio: 7.27 — ABNORMAL HIGH (ref 0.26–1.65)
Lambda free light chains: 25.7 mg/L (ref 5.7–26.3)

## 2023-07-23 ENCOUNTER — Ambulatory Visit
Admission: RE | Admit: 2023-07-23 | Discharge: 2023-07-23 | Disposition: A | Discharge status: Home / Self Care | Source: Ambulatory Visit | Attending: Neurosurgery | Admitting: Neurosurgery

## 2023-07-23 DIAGNOSIS — I6200 Nontraumatic subdural hemorrhage, unspecified: Secondary | ICD-10-CM | POA: Diagnosis not present

## 2023-07-23 DIAGNOSIS — I6782 Cerebral ischemia: Secondary | ICD-10-CM | POA: Diagnosis not present

## 2023-07-23 DIAGNOSIS — S065XAA Traumatic subdural hemorrhage with loss of consciousness status unknown, initial encounter: Secondary | ICD-10-CM

## 2023-07-23 DIAGNOSIS — G319 Degenerative disease of nervous system, unspecified: Secondary | ICD-10-CM | POA: Diagnosis not present

## 2023-07-24 ENCOUNTER — Telehealth: Payer: Self-pay

## 2023-07-24 ENCOUNTER — Other Ambulatory Visit: Payer: Self-pay

## 2023-07-24 DIAGNOSIS — M5136 Other intervertebral disc degeneration, lumbar region with discogenic back pain only: Secondary | ICD-10-CM | POA: Diagnosis not present

## 2023-07-24 DIAGNOSIS — M5416 Radiculopathy, lumbar region: Secondary | ICD-10-CM | POA: Diagnosis not present

## 2023-07-24 DIAGNOSIS — M9903 Segmental and somatic dysfunction of lumbar region: Secondary | ICD-10-CM | POA: Diagnosis not present

## 2023-07-24 DIAGNOSIS — M9905 Segmental and somatic dysfunction of pelvic region: Secondary | ICD-10-CM | POA: Diagnosis not present

## 2023-07-24 NOTE — Telephone Encounter (Signed)
     Copied from CRM [id number]. Topic: Clinical - Prescription Issue >> Jul 24, 2023 10:13 AM Everlene Hobby D wrote: Patient said she hasn't heard back from her pcp about the -Semaglutide , 1 MG/DOSE, 4 MG/3ML SOPN And if the insurance will cover it. She is almost out. Please give her a call.  [facility] [address] [place] [address] Phone: [phone] Fax: [phone] Hours: M-F 7:30a-6p >> Jul 24, 2023 10:17 AM Everlene Hobby D wrote: Told patient to follow up with pharmacy because the reill date was July 18, 2023

## 2023-07-24 NOTE — Telephone Encounter (Signed)
 Pharmacy Patient Advocate Encounter   Received notification from Onbase that prior authorization for Ozempic  (1 MG/DOSE) 4MG /3ML pen-injectors is required/requested.   Insurance verification completed.   The patient is insured through [facility] ADVANTAGE/RX ADVANCE .   Per test claim: PA required; PA submitted to above mentioned insurance via CoverMyMeds Key/confirmation #/EOC [facility] Status is pending

## 2023-07-24 NOTE — Telephone Encounter (Signed)
 Copied from CRM [id number]. Topic: Clinical - Prescription Issue >> Jul 24, 2023 10:13 AM Everlene Hobby D wrote: Patient said she hasn't heard back from her pcp about the -Semaglutide , 1 MG/DOSE, 4 MG/3ML SOPN And if the insurance will cover it. She is almost out. Please give her a call.  [facility] [address] [place] [address] Phone: [phone] Fax: [phone] Hours: M-F 7:30a-6p >> Jul 24, 2023 10:47 AM Ivette P wrote: Pt called in again about this and would need of PA for Ozempic ,   PT would like a follow up if PA is approved or not. Please call pt [phone] >> Jul 24, 2023 10:17 AM Everlene Hobby D wrote: Told patient to follow up with pharmacy because the reill date was July 18, 2023

## 2023-07-25 ENCOUNTER — Other Ambulatory Visit (HOSPITAL_COMMUNITY): Payer: Self-pay

## 2023-07-25 ENCOUNTER — Other Ambulatory Visit: Payer: Self-pay

## 2023-07-25 NOTE — Telephone Encounter (Signed)
 Patient notified

## 2023-07-25 NOTE — Telephone Encounter (Signed)
 Pharmacy Patient Advocate Encounter  Received notification from [employer] that Prior Authorization for Ozempic  (1 MG/DOSE) 4MG /3ML pen-injectors has been APPROVED from 07/24/23 to 07/23/24. Ran test claim, Copay is $0. This test claim was processed through [facility]- copay amounts may vary at other pharmacies due to pharmacy/plan contracts, or as the patient moves through the different stages of their insurance plan.   PA #/Case ID/Reference #: [id number]

## 2023-07-25 NOTE — Progress Notes (Unsigned)
 Referring Physician:  Carlean Charter, DO [address] [place],  [place] [address]  Primary Physician:  Carlean Charter, DO  History of Present Illness: 07/30/2023 Donna Malone has a history of CAD, HTN, DM, GERD, hypothyroidism, CKD stage 4, multiple myeloma, gout, hypercholesterolemia.   Seen in ED on 07/05/23 after a fall and was found to have SDH. She is here for follow up.   She is doing well since her fall. No headaches, dizziness, or vision changes. She notes intermittent numbness above her left eye (had laceration repaired in ED).    Review of Systems:  A 10 point review of systems is negative, except for the pertinent positives and negatives detailed in the HPI.  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   Need for tetanus booster 05/04/2021   Stage 3b chronic kidney disease (HCC) 07/13/2021    Past Surgical History: Past Surgical History:  Procedure Laterality Date   ABDOMINAL HYSTERECTOMY  04/1970   partial   CHOLECYSTECTOMY  late 1990's   COLONOSCOPY WITH PROPOFOL  N/A 10/09/2021   Procedure: COLONOSCOPY WITH PROPOFOL ;  Surgeon: Luke Salaam, MD;  Location: [facility] ENDOSCOPY;  Service: Gastroenterology;  Laterality: N/A;   COLONOSCOPY WITH PROPOFOL  N/A 11/23/2022   Procedure: COLONOSCOPY WITH PROPOFOL ;  Surgeon: Luke Salaam, MD;  Location: [facility] ENDOSCOPY;  Service: Gastroenterology;  Laterality: N/A;   LAPAROSCOPIC APPENDECTOMY N/A 03/14/2022   Procedure: APPENDECTOMY LAPAROSCOPIC;  Surgeon: Emmalene Hare, MD;  Location: [facility] ORS;  Service: General;  Laterality: N/A;   POLYPECTOMY  11/23/2022   Procedure: POLYPECTOMY;  Surgeon: Luke Salaam, MD;  [facility] ENDOSCOPY;   Service: Gastroenterology;;    Allergies: Allergies as of 07/30/2023 - Review Complete 07/05/2023  Allergen Reaction Noted   Sulfa antibiotics Other (See Comments) 09/21/2014    Medications: Outpatient Encounter Medications as of 07/30/2023  Medication Sig   Accu-Chek Softclix Lancets lancets Use as instructed   acetaminophen  (TYLENOL ) 500 MG tablet Take 500 mg by mouth every 6 (six) hours as needed for mild pain or headache.   allopurinol  (ZYLOPRIM ) 100 MG tablet TAKE 1 TABLET BY MOUTH EVERY DAY   atorvastatin  (LIPITOR) 40 MG tablet TAKE 1 TABLET BY MOUTH EVERY DAY   B Complex Vitamins (GNP VITAMIN B COMPLEX PO) Take 1 tablet by mouth daily.   blood glucose meter kit and supplies Dispense based on patient and insurance preference. Use once daily for FBG. (FOR ICD-10 E10.9, E11.9).   Blood Glucose Monitoring Suppl DEVI 1 each by Does not apply route in the morning, at noon, and at bedtime. May substitute to any manufacturer covered by patient's insurance.   cetirizine (ZYRTEC) 10 MG tablet Take 10 mg by mouth daily.   escitalopram  (LEXAPRO ) 20 MG tablet Take 1 tablet (20 mg total) by mouth daily.   famotidine  (PEPCID ) 40 MG tablet Take 1 tablet (40 mg total) by mouth daily.   glucose blood (ACCU-CHEK GUIDE) test strip Use as instructed   KRILL OIL PO Take by mouth.   levothyroxine  (SYNTHROID ) 75 MCG tablet TAKE 1 TABLET BY MOUTH DAILY BEFORE BREAKFAST.   MAGnesium -Oxide 400 (240 Mg) MG tablet Take 1 tablet (400 mg total) by mouth daily.   meclizine  (ANTIVERT ) 25  MG tablet Take 1-2 tablets (25-50 mg total) by mouth 3 (three) times daily as needed for dizziness.   Multiple Vitamins-Minerals (PRESERVISION AREDS 2) CAPS Take 1 capsule by mouth in the morning and at bedtime.   ONETOUCH VERIO test strip USE IN THE MORNING, AT NOON, AND AT BEDTIME.   Polyethyl Glycol-Propyl Glycol (SYSTANE HYDRATION PF OP) Place 1 drop into both eyes daily as needed (for dry eyes).   Semaglutide , 1 MG/DOSE, 4  MG/3ML SOPN Inject 1 mg as directed once a week.   traZODone  (DESYREL ) 50 MG tablet TAKE 1 TABLET BY MOUTH EVERYDAY AT BEDTIME   Vitamin D , Ergocalciferol , (DRISDOL ) 1.25 MG (50000 UNIT) CAPS capsule TAKE 1 CAPSULE (50,000 UNITS TOTAL) BY MOUTH EVERY 7 (SEVEN) DAYS. SUNDAY   No facility-administered encounter medications on file as of 07/30/2023.    Social History: Social History   Tobacco Use   Smoking status: Never   Smokeless tobacco: Never  Vaping Use   Vaping status: Never Used  Substance Use Topics   Alcohol use: No   Drug use: No    Family Medical History: Family History  Problem Relation Age of Onset   CAD Mother    Heart attack Mother    Heart disease Father    Diabetes Cousin    Cancer Cousin     Physical Examination: There were no vitals filed for this visit.  General: Patient is well developed, well nourished, calm, collected, and in no apparent distress. Attention to examination is appropriate.  Respiratory: Patient is breathing without any difficulty.   NEUROLOGICAL:     Awake, alert, oriented to person, place, and time.  Speech is clear and fluent. Fund of knowledge is appropriate.   CRANIAL NERVES: Extraocular muscles are intact  Facial sensation is intact bilaterally  Facial strength is intact bilaterally  Palate elevates midline, normal phonation  Shoulder shrug strength is intact  Tongue protrudes midline    No abnormal lesions on exposed skin.   Strength: Side Biceps Triceps Deltoid Interossei Grip Wrist Ext. Wrist Flex.  R 5 5 5 5 5 5 5   L 5 5 5 5 5 5 5    Side Iliopsoas Quads Hamstring PF DF EHL  R 5 5 5 5 5 5   L 5 5 5 5 5 5    Reflexes are 2+ and symmetric at the biceps, brachioradialis, patella and achilles.   Hoffman's is absent.  Clonus is not present.   Bilateral upper and lower extremity sensation is intact to light touch.     Gait is normal.     Medical Decision Making  Imaging: CT of head dated 07/23/23:   FINDINGS: Brain: Near complete resolution of the previously seen para falcine subdural hematoma. Very tiny focus of persistent subdural blood on the left side of the falx axial image 14 and coronal image 17, not significant. No new or worsening extra-axial collection. Brain shows atrophy with chronic small-vessel ischemic changes of the white matter. No hydrocephalus.   Vascular: There is atherosclerotic calcification of the major vessels at the base of the brain.   Skull: Negative   Sinuses/Orbits: Clear/normal   Other: None   IMPRESSION: 1. Near complete resolution of the previously seen para falcine subdural hematoma. Very tiny focus of persistent subdural blood on the left side of the falx, not significant. No new or worsening finding. 2. Atrophy and chronic small-vessel ischemic changes of the white matter.     Electronically Signed   By: Junius Olive.D.  On: 07/29/2023 08:00  I have personally reviewed the images and agree with the above interpretation. Above imaging reviewed with Dr. Felipe Horton prior to her visit.   Assessment and Plan: Ms. Brule had a fall on 07/05/23 with SDH.   She is doing well since her fall. No headaches, dizziness, or vision changes. She notes intermittent numbness above her left eye (had laceration repaired in ED).   Above head CT shows near complete resolution of the previously seen para falcine subdural hematoma.   Treatment options discussed with patient and following plan made:   - She can return to activity as tolerated.  - Discussed numbness over left eye likely due to laceration repair. Should improve with time.  - She will f/u prn.   I spent a total of 25 minutes in face-to-face and non-face-to-face activities related to this patient's care today including review of outside records, review of imaging, review of symptoms, physical exam, discussion of differential diagnosis, discussion of treatment options, and documentation.    Thank you for involving me in the care of this patient.   Lucetta Russel PA-C Dept. of Neurosurgery

## 2023-07-29 ENCOUNTER — Ambulatory Visit
Admission: RE | Admit: 2023-07-29 | Discharge: 2023-07-29 | Disposition: A | Discharge status: Home / Self Care | Source: Ambulatory Visit | Attending: Pulmonary Disease | Admitting: Pulmonary Disease

## 2023-07-29 ENCOUNTER — Other Ambulatory Visit: Payer: Self-pay

## 2023-07-29 DIAGNOSIS — N183 Chronic kidney disease, stage 3 unspecified: Secondary | ICD-10-CM | POA: Diagnosis not present

## 2023-07-29 DIAGNOSIS — R0602 Shortness of breath: Secondary | ICD-10-CM | POA: Insufficient documentation

## 2023-07-29 DIAGNOSIS — E1122 Type 2 diabetes mellitus with diabetic chronic kidney disease: Secondary | ICD-10-CM | POA: Diagnosis not present

## 2023-07-29 LAB — ECHOCARDIOGRAM COMPLETE
AR max vel: 2.04 cm2
AV Area VTI: 1.86 cm2
AV Area mean vel: 1.83 cm2
AV Mean grad: 2 mmHg
AV Peak grad: 3.2 mmHg
Ao pk vel: 0.9 m/s
Area-P 1/2: 3.5 cm2
MV VTI: 1.53 cm2
S' Lateral: 2.2 cm

## 2023-07-29 NOTE — Progress Notes (Signed)
*  PRELIMINARY RESULTS* Echocardiogram 2D Echocardiogram has been performed.  Broadus Canes 07/29/2023, 10:17 AM

## 2023-07-29 NOTE — Progress Notes (Signed)
*  PRELIMINARY RESULTS* Echocardiogram 2D Echocardiogram has been performed.  Donna Malone 07/29/2023, 10:18 AM

## 2023-07-30 ENCOUNTER — Encounter: Payer: Self-pay | Admitting: Orthopedic Surgery

## 2023-07-30 ENCOUNTER — Ambulatory Visit: Admitting: Orthopedic Surgery

## 2023-07-30 VITALS — BP 128/70 | Ht 63.0 in | Wt 186.0 lb

## 2023-07-30 DIAGNOSIS — W19XXXA Unspecified fall, initial encounter: Secondary | ICD-10-CM

## 2023-07-30 DIAGNOSIS — S065XAA Traumatic subdural hemorrhage with loss of consciousness status unknown, initial encounter: Secondary | ICD-10-CM

## 2023-08-01 ENCOUNTER — Inpatient Hospital Stay: Payer: PPO | Attending: Oncology | Admitting: Oncology

## 2023-08-01 ENCOUNTER — Encounter: Payer: Self-pay | Admitting: Oncology

## 2023-08-01 VITALS — BP 107/57 | HR 77 | Temp 97.5°F | Resp 18 | Wt 182.4 lb

## 2023-08-01 DIAGNOSIS — N184 Chronic kidney disease, stage 4 (severe): Secondary | ICD-10-CM

## 2023-08-01 DIAGNOSIS — D72820 Lymphocytosis (symptomatic): Secondary | ICD-10-CM | POA: Diagnosis not present

## 2023-08-01 DIAGNOSIS — D472 Monoclonal gammopathy: Secondary | ICD-10-CM | POA: Diagnosis not present

## 2023-08-01 DIAGNOSIS — C9000 Multiple myeloma not having achieved remission: Secondary | ICD-10-CM | POA: Diagnosis not present

## 2023-08-01 NOTE — Assessment & Plan Note (Addendum)
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  M protein on UPEP has decreased.  Patient has no  hypercalcemia, Hb>10, no bone lesions on x-ray skeletal survey as well as PET scan.  Repeat skeletal survey, Patient's kidney function is worse. It is difficult to distinguish if her renal function impairment is due to DM or light chain kidney disease. Kidney biopsy will help to clarify and she declined.  Recommend her to follow up with nephrology. If renal function is progressively worse, can consider empirically try MM treatment to see if that helps to improve renal function,    Lab Results  Component Value Date   MPROTEIN Comment (A) 07/18/2023   KPAFRELGTCHN 186.9 (H) 07/18/2023   LAMBDASER 25.7 07/18/2023   KAPLAMBRATIO 7.27 (H) 07/18/2023      Labs reviewed and discussed with patient, urine M protein is progressing.  stable kidney function, normal calcium , slight decrease of hemoglobin. Patient declines kidney biopsy. No absolute evidence of organ damage.  I recommend continue observation.follow up in 3 months. Repeat PET whole body

## 2023-08-01 NOTE — Progress Notes (Signed)
 Pt here for follow up. Pt reports that she will be seeing pulmonology due to breathing issues after fall. Pt reports fatigue.

## 2023-08-01 NOTE — Assessment & Plan Note (Signed)
Minute clone of B cells. 1 % cells <5000 per ul. Continue observation.  Stable and normal total WBC and lymphocyte count.

## 2023-08-01 NOTE — Assessment & Plan Note (Signed)
Encourage oral hydration avoid nephrotoxins.  Continue follow-up with nephrology.

## 2023-08-01 NOTE — Progress Notes (Signed)
 Hematology/Oncology Progress note [phone] [phone]      CHIEF COMPLAINTS/REASON FOR VISIT:  Smoldering Multiple myeloma, monoclonal lymphocytosis.   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  M protein on UPEP has decreased.  Patient has no  hypercalcemia, Hb>10, no bone lesions on x-ray skeletal survey as well as PET scan.  Repeat skeletal survey, Patient's kidney function is worse. It is difficult to distinguish if her renal function impairment is due to DM or light chain kidney disease. Kidney biopsy will help to clarify and she declined.  Recommend her to follow up with nephrology. If renal function is progressively worse, can consider empirically try MM treatment to see if that helps to improve renal function,    Lab Results  Component Value Date   MPROTEIN Comment (A) 07/18/2023   KPAFRELGTCHN 186.9 (H) 07/18/2023   LAMBDASER 25.7 07/18/2023   KAPLAMBRATIO 7.27 (H) 07/18/2023      Labs reviewed and discussed with patient, urine M protein is progressing.  stable kidney function, normal calcium , slight decrease of hemoglobin. Patient declines kidney biopsy. No absolute evidence of organ damage.  I recommend continue observation.follow up in 3 months. Repeat PET whole body  Monoclonal B-cell lymphocytosis of unknown significance Minute clone of B cells. 1 % cells <5000 per ul. Continue observation.  Stable and normal total WBC and lymphocyte count.    Stage 4 chronic kidney disease (HCC) Encourage oral hydration avoid  nephrotoxins.   Continue follow-up with nephrology.  Orders Placed This Encounter  Procedures   NM PET Image Restage (PS) Whole Body    Standing Status:   Future    Expected Date:   11/01/2023    Expiration Date:   07/31/2024    If indicated for the ordered procedure, I authorize the administration of a radiopharmaceutical per Radiology protocol:   Yes    Preferred imaging location?:   [facility]   CBC with Differential (Cancer Center Only)    Standing Status:   Future    Expected Date:   11/01/2023    Expiration Date:   07/31/2024   CMP (Cancer Center only)    Standing Status:   Future    Expected Date:   11/01/2023    Expiration Date:   07/31/2024   Multiple Myeloma Panel (SPEP&IFE w/QIG)    Standing Status:   Future    Expected Date:   11/01/2023    Expiration Date:   07/31/2024   Kappa/lambda light chains    Standing Status:   Future    Expected Date:   11/01/2023    Expiration Date:   07/31/2024   IFE+PROTEIN ELECTRO, 24-HR UR    Standing Status:   Future    Expected Date:   11/01/2023    Expiration Date:   07/31/2024   Follow-up in 3 months. All questions were answered. The patient knows to call the clinic with any problems, questions or concerns.  Timmy Forbes, MD, PhD [facility] Hematology Oncology 08/01/2023     HISTORY OF PRESENTING ILLNESS:  Donna Malone is a  78 y.o.  female with PMH listed below who was  referred to me for evaluation of leukocytosis Reviewed patient' recent labs obtained by PCP.   10/22/2019 CBC showed elevated white count of 11.3, predominantly lymphocytosis.  Normal hemoglobin and platelet count. Previous lab records reviewed. Leukocytosis onset of chronic, duration is since January 2021.   No aggravating or elevated factors. Associated symptoms or signs:  Denies weight loss, fever, chills,night sweats.  Endorses fatigue Smoking history: Never smoker History of recent oral steroid use or steroid injection: No recent steroid injections. History of  recent infection: Denies Autoimmune disease history.  Denies  Patient denies any chronic wound, prosthesis.  She has chronic knee arthritis.  #  flow cytometry showed CD5+ , CD23 + B-cell population.  CLL/SLL phenotype.  CD38 negative, <1% leukocytes,<5000 cells/ul # 06/29/2021, bone marrow biopsy showed hyper cellular marrow with plasma cell neoplasm, 10 to 15% plasma cell infiltrates, predominant.  Cytogenetics is normal.  Multiple myeloma FISH panel normal.  Standard risk.  Patient takes Lasix   for lower extremity swelling.  09/07/2022 US  renal ultrasound previously showed Mild bilateral renal atrophy as well as increased echogenicity of renal parenchyma INTERVAL HISTORY Donna Malone is a 78 y.o. female who has above history reviewed by me today presents for follow up smothering multiple myeloma. She feels well today. No new complains.    Review of Systems  Constitutional:  Positive for fatigue. Negative for appetite change, chills and fever.  HENT:   Negative for hearing loss and voice change.   Eyes:  Negative for eye problems.  Respiratory:  Negative for chest tightness and cough.   Cardiovascular:  Negative for chest pain.  Gastrointestinal:  Negative for abdominal distention, abdominal pain and blood in stool.  Endocrine: Negative for hot flashes.  Genitourinary:  Negative for difficulty urinating and frequency.   Musculoskeletal:  Positive for arthralgias.  Skin:  Negative for itching and rash.  Neurological:  Negative for extremity weakness.  Hematological:  Negative for adenopathy.  Psychiatric/Behavioral:  Negative for confusion.      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   Need for  tetanus booster 05/04/2021   Stage 3b chronic kidney disease (HCC) 07/13/2021    SURGICAL HISTORY: Past Surgical History:  Procedure Laterality Date   ABDOMINAL HYSTERECTOMY  04/1970   partial   CHOLECYSTECTOMY  late 1990's   COLONOSCOPY WITH PROPOFOL  N/A 10/09/2021   Procedure: COLONOSCOPY WITH PROPOFOL ;  Surgeon: Luke Salaam, MD;  Location: [facility] ENDOSCOPY;  Service: Gastroenterology;  Laterality: N/A;   COLONOSCOPY WITH PROPOFOL  N/A 11/23/2022   Procedure: COLONOSCOPY WITH PROPOFOL ;  Surgeon: Luke Salaam, MD;  Location[facility] ENDOSCOPY;  Service: Gastroenterology;  Laterality: N/A;   LAPAROSCOPIC APPENDECTOMY N/A 03/14/2022   Procedure: APPENDECTOMY LAPAROSCOPIC;  Surgeon: Emmalene Hare, MD;  Location: [facility] ORS;  Service: General;  Laterality: N/A;   POLYPECTOMY  11/23/2022   Procedure: POLYPECTOMY;  Surgeon: Luke Salaam, MD;  Location: [facility] ENDOSCOPY;  Service: Gastroenterology;;    SOCIAL HISTORY: Social History   Socioeconomic History   Marital status: Widowed    Spouse name: Arcadio Beams   Number of children: 3   Years of education: Not on file   Highest education level: 12th grade  Occupational History   Occupation: retired  Tobacco Use  Smoking status: Never   Smokeless tobacco: Never  Vaping Use   Vaping status: Never Used  Substance and Sexual Activity   Alcohol use: No   Drug use: No   Sexual activity: Not Currently  Other Topics Concern   Not on file  Social History Narrative   Not on file   Social Drivers of Health   Financial Resource Strain: Low Risk  (04/30/2022)   Overall Financial Resource Strain (CARDIA)    Difficulty of Paying Living Expenses: Not hard at all  Food Insecurity: No Food Insecurity (04/30/2022)   Hunger Vital Sign    Worried About Running Out of Food in the Last Year: Never true    Ran Out of Food in the Last Year: Never true  Transportation Needs: No Transportation Needs (04/30/2022)   PRAPARE - Administrator, Civil Service  (Medical): No    Lack of Transportation (Non-Medical): No  Physical Activity: Inactive (04/30/2022)   Exercise Vital Sign    Days of Exercise per Week: 0 days    Minutes of Exercise per Session: 0 min  Stress: No Stress Concern Present (04/30/2022)   [employer] of Occupational Health - Occupational Stress Questionnaire    Feeling of Stress : Not at all  Social Connections: Moderately Isolated (04/30/2022)   Social Connection and Isolation Panel [NHANES]    Frequency of Communication with Friends and Family: More than three times a week    Frequency of Social Gatherings with Friends and Family: Twice a week    Attends Religious Services: More than 4 times per year    Active Member of [employer] or Organizations: No    Attends Banker Meetings: Never    Marital Status: Widowed  Intimate Partner Violence: Not At Risk (04/30/2022)   Humiliation, Afraid, Rape, and Kick questionnaire    Fear of Current or Ex-Partner: No    Emotionally Abused: No    Physically Abused: No    Sexually Abused: No    FAMILY HISTORY: Family History  Problem Relation Age of Onset   CAD Mother    Heart attack Mother    Heart disease Father    Diabetes Cousin    Cancer Cousin     ALLERGIES:  is allergic to sulfa antibiotics.  MEDICATIONS:  Current Outpatient Medications  Medication Sig Dispense Refill   Accu-Chek Softclix Lancets lancets Use as instructed 100 each 12   acetaminophen  (TYLENOL ) 500 MG tablet Take 500 mg by mouth every 6 (six) hours as needed for mild pain or headache.     allopurinol  (ZYLOPRIM ) 100 MG tablet TAKE 1 TABLET BY MOUTH EVERY DAY 90 tablet 1   atorvastatin  (LIPITOR) 40 MG tablet TAKE 1 TABLET BY MOUTH EVERY DAY 90 tablet 1   B Complex Vitamins (GNP VITAMIN B COMPLEX PO) Take 1 tablet by mouth daily.     blood glucose meter kit and supplies Dispense based on patient and insurance preference. Use once daily for FBG. (FOR ICD-10 E10.9, E11.9). 1 each 0   Blood Glucose  Monitoring Suppl DEVI 1 each by Does not apply route in the morning, at noon, and at bedtime. May substitute to any manufacturer covered by patient's insurance. 1 each 0   cetirizine (ZYRTEC) 10 MG tablet Take 10 mg by mouth daily.     escitalopram  (LEXAPRO ) 20 MG tablet Take 1 tablet (20 mg total) by mouth daily. 90 tablet 0   famotidine  (PEPCID ) 40 MG tablet Take 1 tablet (40 mg total)  by mouth daily. 90 tablet 0   glucose blood (ACCU-CHEK GUIDE) test strip Use as instructed 100 each 12   KRILL OIL PO Take by mouth.     levothyroxine  (SYNTHROID ) 75 MCG tablet TAKE 1 TABLET BY MOUTH DAILY BEFORE BREAKFAST. 90 tablet 1   MAGnesium -Oxide 400 (240 Mg) MG tablet Take 1 tablet (400 mg total) by mouth daily. 30 tablet 1   meclizine  (ANTIVERT ) 25 MG tablet Take 1-2 tablets (25-50 mg total) by mouth 3 (three) times daily as needed for dizziness. 30 tablet 0   Multiple Vitamins-Minerals (PRESERVISION AREDS 2) CAPS Take 1 capsule by mouth in the morning and at bedtime.     ONETOUCH VERIO test strip USE IN THE MORNING, AT NOON, AND AT BEDTIME. 100 strip 0   Polyethyl Glycol-Propyl Glycol (SYSTANE HYDRATION PF OP) Place 1 drop into both eyes daily as needed (for dry eyes).     Semaglutide , 1 MG/DOSE, 4 MG/3ML SOPN Inject 1 mg as directed once a week. 9 mL 0   traZODone  (DESYREL ) 50 MG tablet TAKE 1 TABLET BY MOUTH EVERYDAY AT BEDTIME 90 tablet 0   Vitamin D , Ergocalciferol , (DRISDOL ) 1.25 MG (50000 UNIT) CAPS capsule TAKE 1 CAPSULE (50,000 UNITS TOTAL) BY MOUTH EVERY 7 (SEVEN) DAYS. SUNDAY 13 capsule 0   No current facility-administered medications for this visit.     PHYSICAL EXAMINATION: ECOG PERFORMANCE STATUS: 1 - Symptomatic but completely ambulatory Vitals:   08/01/23 1020  BP: (!) 107/57  Pulse: 77  Resp: 18  Temp: (!) 97.5 F (36.4 C)   Filed Weights   08/01/23 1020  Weight: 182 lb 6.4 oz (82.7 kg)    Physical Exam Constitutional:      General: She is not in acute distress.     Comments: Patient walks independently  HENT:     Head: Normocephalic and atraumatic.  Eyes:     General: No scleral icterus. Cardiovascular:     Rate and Rhythm: Normal rate and regular rhythm.     Heart sounds: Normal heart sounds.  Pulmonary:     Effort: Pulmonary effort is normal. No respiratory distress.     Breath sounds: No wheezing.  Abdominal:     General: Bowel sounds are normal. There is no distension.     Palpations: Abdomen is soft.  Musculoskeletal:        General: No deformity. Normal range of motion.     Cervical back: Normal range of motion and neck supple.  Skin:    General: Skin is warm and dry.     Findings: No erythema or rash.  Neurological:     Mental Status: She is alert and oriented to person, place, and time. Mental status is at baseline.     Cranial Nerves: No cranial nerve deficit.     Coordination: Coordination normal.  Psychiatric:        Mood and Affect: Mood normal.        Latest Ref Rng & Units 07/18/2023   10:28 AM  CMP  Glucose 70 - 99 mg/dL 161   BUN 8 - 23 mg/dL 26   Creatinine 0.96 - 1.00 mg/dL 0.45   Sodium 409 - 811 mmol/L 136   Potassium 3.5 - 5.1 mmol/L 4.2   Chloride 98 - 111 mmol/L 107   CO2 22 - 32 mmol/L 19   Calcium  8.9 - 10.3 mg/dL 9.2   Total Protein 6.5 - 8.1 g/dL 6.4   Total Bilirubin 0.0 - 1.2 mg/dL 0.7  Alkaline Phos 38 - 126 U/L 129   AST 15 - 41 U/L 20   ALT 0 - 44 U/L 15       Latest Ref Rng & Units 07/18/2023   10:28 AM  CBC  WBC 4.0 - 10.5 K/uL 8.4   Hemoglobin 12.0 - 15.0 g/dL 16.1   Hematocrit 09.6 - 46.0 % 33.4   Platelets 150 - 400 K/uL 265      RADIOGRAPHIC STUDIES: I have personally reviewed the radiological images as listed and agreed with the findings in the report. ECHOCARDIOGRAM COMPLETE Result Date: 07/29/2023    ECHOCARDIOGRAM REPORT   Patient Name:   Donna Malone [facility] Date of Exam: 07/29/2023 Medical Rec #:  [id number]          Height:       63.0 in Accession #:    [id number]          Weight:       186.0 lb Date of Birth:  10-27-1945          BSA:          1.875 m Patient Age:    78 years           BP:           96/77 mmHg Patient Gender: F                  HR:           77 bpm. Exam Location:  [facility] Procedure: 2D Echo, Cardiac Doppler and Color Doppler (Both Spectral and Color            Flow Doppler were utilized during procedure). Indications:     Shortness of breath R06.02                  Dyspnea R06.00  History:         Patient has no prior history of Echocardiogram examinations.                  Risk Factors:Diabetes. Chronic kidney disease stage 3.  Sonographer:     Broadus Canes Referring Phys:  [id number] CARMEN L GONZALEZ Diagnosing Phys: Antionette Kirks MD IMPRESSIONS  1. Left ventricular ejection fraction, by estimation, is 55 to 60%. The left ventricle has normal function. The left ventricle has no regional wall motion abnormalities. Left ventricular diastolic parameters are consistent with Grade I diastolic dysfunction (impaired relaxation).  2. Right ventricular systolic function is normal. The right ventricular size is normal. Tricuspid regurgitation signal is inadequate for assessing PA pressure.  3. The mitral valve is normal in structure. No evidence of mitral valve regurgitation. No evidence of mitral stenosis.  4. The aortic valve is normal in structure. Aortic valve regurgitation is not visualized. No aortic stenosis is present.  5. The inferior vena cava is normal in size with greater than 50% respiratory variability, suggesting right atrial pressure of 3 mmHg. FINDINGS  Left Ventricle: Left ventricular ejection fraction, by estimation, is 55 to 60%. The left ventricle has normal function. The left ventricle has no regional wall motion abnormalities. The left ventricular internal cavity size was normal in size. There is  borderline left ventricular hypertrophy. Left ventricular diastolic parameters are consistent with Grade I diastolic dysfunction (impaired relaxation). Right  Ventricle: The right ventricular size is normal. No increase in right ventricular wall thickness. Right ventricular systolic function is normal. Tricuspid regurgitation signal is inadequate for assessing PA pressure. The tricuspid regurgitant velocity is  2.15 m/s, and with an assumed right atrial pressure of 3 mmHg, the estimated right ventricular systolic pressure is 21.5 mmHg. Left Atrium: Left atrial size was normal in size. Right Atrium: Right atrial size was normal in size. Pericardium: There is no evidence of pericardial effusion. Mitral Valve: The mitral valve is normal in structure. No evidence of mitral valve regurgitation. No evidence of mitral valve stenosis. MV peak gradient, 4.8 mmHg. The mean mitral valve gradient is 2.0 mmHg. Tricuspid Valve: The tricuspid valve is normal in structure. Tricuspid valve regurgitation is not demonstrated. No evidence of tricuspid stenosis. Aortic Valve: The aortic valve is normal in structure. Aortic valve regurgitation is not visualized. No aortic stenosis is present. Aortic valve mean gradient measures 2.0 mmHg. Aortic valve peak gradient measures 3.2 mmHg. Aortic valve area, by VTI measures 1.86 cm. Pulmonic Valve: The pulmonic valve was normal in structure. Pulmonic valve regurgitation is not visualized. No evidence of pulmonic stenosis. Aorta: The aortic root is normal in size and structure. Venous: The inferior vena cava is normal in size with greater than 50% respiratory variability, suggesting right atrial pressure of 3 mmHg. IAS/Shunts: No atrial level shunt detected by color flow Doppler.  LEFT VENTRICLE PLAX 2D LVIDd:         3.20 cm   Diastology LVIDs:         2.20 cm   LV e' medial:    4.79 cm/s LV PW:         1.10 cm   LV E/e' medial:  9.9 LV IVS:        1.10 cm   LV e' lateral:   10.00 cm/s LVOT diam:     2.00 cm   LV E/e' lateral: 4.7 LV SV:         31 LV SV Index:   16 LVOT Area:     3.14 cm  RIGHT VENTRICLE RV Basal diam:  3.20 cm RV Mid diam:    2.90  cm RV S prime:     12.20 cm/s TAPSE (M-mode): 2.0 cm LEFT ATRIUM             Index        RIGHT ATRIUM           Index LA diam:        3.80 cm 2.03 cm/m   RA Area:     11.20 cm LA Vol (A2C):   28.1 ml 14.99 ml/m  RA Volume:   22.80 ml  12.16 ml/m LA Vol (A4C):   48.2 ml 25.71 ml/m LA Biplane Vol: 37.7 ml 20.11 ml/m  AORTIC VALVE AV Area (Vmax):    2.04 cm AV Area (Vmean):   1.83 cm AV Area (VTI):     1.86 cm AV Vmax:           89.90 cm/s AV Vmean:          64.667 cm/s AV VTI:            0.166 m AV Peak Grad:      3.2 mmHg AV Mean Grad:      2.0 mmHg LVOT Vmax:         58.30 cm/s LVOT Vmean:        37.700 cm/s LVOT VTI:          0.098 m LVOT/AV VTI ratio: 0.59  AORTA Ao Root diam: 2.70 cm MITRAL VALVE               TRICUSPID  VALVE MV Area (PHT): 3.50 cm    TR Peak grad:   18.5 mmHg MV Area VTI:   1.53 cm    TR Vmax:        215.00 cm/s MV Peak grad:  4.8 mmHg MV Mean grad:  2.0 mmHg    SHUNTS MV Vmax:       1.09 m/s    Systemic VTI:  0.10 m MV Vmean:      59.1 cm/s   Systemic Diam: 2.00 cm MV Decel Time: 217 msec MV E velocity: 47.20 cm/s MV A velocity: 94.30 cm/s MV E/A ratio:  0.50 Antionette Kirks MD Electronically signed by Antionette Kirks MD Signature Date/Time: 07/29/2023/11:23:03 AM    Final    CT HEAD WO CONTRAST ( ) Result Date: 07/29/2023 CLINICAL DATA:  Follow-up subdural hematoma.  Fell on 07/05/2023 EXAM: CT HEAD WITHOUT CONTRAST TECHNIQUE: Contiguous axial images were obtained from the base of the skull through the vertex without intravenous contrast. RADIATION DOSE REDUCTION: This exam was performed according to the departmental dose-optimization program which includes automated exposure control, adjustment of the mA and/or kV according to patient size and/or use of iterative reconstruction technique. COMPARISON:  07/05/2023 FINDINGS: Brain: Near complete resolution of the previously seen para falcine subdural hematoma. Very tiny focus of persistent subdural blood on the left side of the  falx axial image 14 and coronal image 17, not significant. No new or worsening extra-axial collection. Brain shows atrophy with chronic small-vessel ischemic changes of the white matter. No hydrocephalus. Vascular: There is atherosclerotic calcification of the major vessels at the base of the brain. Skull: Negative Sinuses/Orbits: Clear/normal Other: None IMPRESSION: 1. Near complete resolution of the previously seen para falcine subdural hematoma. Very tiny focus of persistent subdural blood on the left side of the falx, not significant. No new or worsening finding. 2. Atrophy and chronic small-vessel ischemic changes of the white matter. Electronically Signed   By: Bettylou Brunner M.D.   On: 07/29/2023 08:00   CT Head Wo Contrast Result Date: 07/05/2023 CLINICAL DATA:  Trauma EXAM: CT HEAD WITHOUT CONTRAST TECHNIQUE: Contiguous axial images were obtained from the base of the skull through the vertex without intravenous contrast. RADIATION DOSE REDUCTION: This exam was performed according to the departmental dose-optimization program which includes automated exposure control, adjustment of the mA and/or kV according to patient size and/or use of iterative reconstruction technique. COMPARISON:  Head CT 07/05/2023. FINDINGS: Brain: A small anterior left parafalcine subdural hematoma measuring up to 2 mm appears unchanged from prior examination. No significant midline shift or mass effect identified. No new separate areas of hemorrhage. There is no acute infarct or hydrocephalus. There is mild diffuse atrophy. There is mild periventricular white matter hypodensity, likely chronic small vessel ischemic change. 6 mm locule of fat is again noted along the left choroid plexus. Vascular: Atherosclerotic calcifications are present within the cavernous internal carotid arteries. Skull: Normal. Negative for fracture or focal lesion. Sinuses/Orbits: No acute finding. Other: Stable left supraorbital scalp soft tissue swelling.  IMPRESSION: 1. Unchanged 2 mm subdural hemorrhage in the left parafalcine region. No new separate areas of hemorrhage. Electronically Signed   By: Tyron Gallon M.D.   On: 07/05/2023 19:47   CT CHEST ABDOMEN PELVIS WO CONTRAST Result Date: 07/05/2023 CLINICAL DATA:  Polytrauma, blunt left lower chest, upper abd pain EXAM: CT CHEST, ABDOMEN AND PELVIS WITHOUT CONTRAST TECHNIQUE: Multidetector CT imaging of the chest, abdomen and pelvis was performed following the standard protocol without IV contrast.  RADIATION DOSE REDUCTION: This exam was performed according to the departmental dose-optimization program which includes automated exposure control, adjustment of the mA and/or kV according to patient size and/or use of iterative reconstruction technique. COMPARISON:  CT scan abdomen and pelvis from 08/23/2022 and PET-CT scan from 07/26/2021. FINDINGS: CT CHEST FINDINGS Cardiovascular: Normal cardiac size. No pericardial effusion. No aortic aneurysm. There are coronary artery calcifications, in keeping with coronary artery disease. There are also mild peripheral atherosclerotic vascular calcifications of thoracic aorta and its major branches. Mediastinum/Nodes: Visualized thyroid  gland appears grossly unremarkable. No solid / cystic mediastinal masses. The esophagus is nondistended precluding optimal assessment. No mediastinal or axillary lymphadenopathy by size criteria. Evaluation of bilateral hila is limited due to lack on intravenous contrast: however, no large hilar lymphadenopathy identified. Lungs/Pleura: The central tracheo-bronchial tree is patent. No mass or consolidation. No pleural effusion or pneumothorax. No suspicious lung nodules. Musculoskeletal: The visualized soft tissues of the chest wall are grossly unremarkable. No suspicious osseous lesions. There are mild to moderate multilevel degenerative changes in the visualized spine. CT ABDOMEN PELVIS FINDINGS Hepatobiliary: The liver is normal in size.  Non-cirrhotic configuration. No suspicious mass. No intrahepatic or extrahepatic bile duct dilation. Gallbladder is surgically absent. Pancreas: Unremarkable. No pancreatic ductal dilatation or surrounding inflammatory changes. Spleen: Within normal limits. No focal lesion. Adrenals/Urinary Tract: Adrenal glands are unremarkable. No suspicious renal mass within the limitations of this unenhanced exam. There is a sinus cyst in the left kidney lower pole measuring approximately 1.6 x 1.7 cm. No hydroureteronephrosis or nephroureterolithiasis on either side. Unremarkable urinary bladder. Stomach/Bowel: There is a 3.0 x 3.2 cm diverticulum arising from the fundus of the stomach. No disproportionate dilation of the small or large bowel loops. No evidence of abnormal bowel wall thickening or inflammatory changes. The appendix is surgically absent. There are multiple diverticula mainly in the left hemi colon, without imaging signs of diverticulitis. Vascular/Lymphatic: No ascites or pneumoperitoneum. No abdominal or pelvic lymphadenopathy, by size criteria. No aneurysmal dilation of the major abdominal arteries. There are moderate peripheral atherosclerotic vascular calcifications of the aorta and its major branches. Reproductive: The uterus is surgically absent. No large adnexal mass. Other: Note is made of collapse of the pelvic floor with small cystocele and rectocele. There is a tiny fat containing umbilical hernia. The soft tissues and abdominal wall are otherwise unremarkable. Musculoskeletal: No suspicious osseous lesions. There are mild - moderate multilevel degenerative changes in the visualized spine. IMPRESSION: *No acute traumatic injury to the chest, abdomen or pelvis. *Multiple other nonacute observations, as described above. Electronically Signed   By: Beula Brunswick M.D.   On: 07/05/2023 16:28   CT Head Wo Contrast Addendum Date: 07/05/2023 ADDENDUM REPORT: 07/05/2023 14:12 ADDENDUM: Critical  Value/emergent results were called by telephone at the time of interpretation on 07/05/2023 at 2:04pm to provider MARK QUALE , who verbally acknowledged these results. Electronically Signed   By: Audree Leas M.D.   On: 07/05/2023 14:12   Result Date: 07/05/2023 CLINICAL DATA:  Fall down stairs. EXAM: CT HEAD WITHOUT CONTRAST TECHNIQUE: Contiguous axial images were obtained from the base of the skull through the vertex without intravenous contrast. RADIATION DOSE REDUCTION: This exam was performed according to the departmental dose-optimization program which includes automated exposure control, adjustment of the mA and/or kV according to patient size and/or use of iterative reconstruction technique. COMPARISON:  CT head without contrast 11/07/2021 FINDINGS: Brain: A 2.5 mm subdural hemorrhage is noted along the left side of the anterior inter  cerebral falx. No parenchymal hemorrhage is present. Moderate generalized atrophy and white matter disease is again seen bilaterally. The ventricles are proportionate to the degree of atrophy. A 6 mm locule of fat is again noted along the left choroid plexus. Deep brain nuclei are within normal limits. The brainstem and cerebellum are within normal limits. Midline structures are within normal limits. Vascular: Atherosclerotic calcifications are present within the cavernous internal carotid arteries bilaterally and at the dural margin of the left vertebral artery. No hyperdense vessel is present. Skull: Left supraorbital scalp soft tissue swelling is present. No underlying fracture or foreign body is present. Calvarium is intact. No focal lytic or blastic lesions are present. The extracranial soft tissues are otherwise within normal limits. Sinuses/Orbits: The paranasal sinuses and mastoid air cells are clear. Bilateral lens replacements are noted. Globes and orbits are otherwise unremarkable. IMPRESSION: 1. 2.5 mm subdural hemorrhage along the left side of the anterior  inter cerebral falx. 2. Left supraorbital scalp soft tissue swelling without underlying fracture or foreign body. 3. Stable moderate generalized atrophy and white matter disease. This likely reflects the sequela of chronic microvascular ischemia. Electronically Signed: By: Audree Leas M.D. On: 07/05/2023 14:01   CT Maxillofacial Wo Contrast Addendum Date: 07/05/2023 ADDENDUM REPORT: 07/05/2023 14:11 ADDENDUM: The 2.5 mm subdural hematoma along the anterior intracerebral falx is better visualized on the CT head of the same day. Critical Value/emergent results were called by telephone at the time of interpretation on 07/05/2023 at 2:04 pm to provider MARK QUALE , who verbally acknowledged these results. Electronically Signed   By: Audree Leas M.D.   On: 07/05/2023 14:11   Result Date: 07/05/2023 CLINICAL DATA:  Fall. EXAM: CT MAXILLOFACIAL WITHOUT CONTRAST TECHNIQUE: Multidetector CT imaging of the maxillofacial structures was performed. Multiplanar CT image reconstructions were also generated. RADIATION DOSE REDUCTION: This exam was performed according to the departmental dose-optimization program which includes automated exposure control, adjustment of the mA and/or kV according to patient size and/or use of iterative reconstruction technique. COMPARISON:  CT head without contrast 11/07/2021 FINDINGS: Osseous: No acute fractures are present. No focal osseous lesions are present. Orbits: Bilateral lens replacements are noted. Globes and orbits are otherwise unremarkable. Sinuses: The paranasal sinuses and mastoid air cells are clear. Soft tissues: Left supraorbital scalp hematoma is present without an underlying fracture. No radiopaque foreign body is present. Limited intracranial: Moderate generalized atrophy and white matter disease is present without significant interval change. Atherosclerotic calcifications present within the cavernous internal carotid arteries and at the dural margin of the  left vertebral artery. No hyperdense vessel is present. IMPRESSION: 1. Left supraorbital scalp hematoma without an underlying fracture. 2. No acute intracranial abnormality. 3. Moderate generalized atrophy and white matter disease likely reflects the sequela of chronic microvascular ischemia. Electronically Signed: By: Audree Leas M.D. On: 07/05/2023 14:04   CT CERVICAL SPINE WO CONTRAST Result Date: 07/05/2023 CLINICAL DATA:  Fall. EXAM: CT CERVICAL SPINE WITHOUT CONTRAST TECHNIQUE: Multidetector CT imaging of the cervical spine was performed without intravenous contrast. Multiplanar CT image reconstructions were also generated. RADIATION DOSE REDUCTION: This exam was performed according to the departmental dose-optimization program which includes automated exposure control, adjustment of the mA and/or kV according to patient size and/or use of iterative reconstruction technique. COMPARISON:  MR Miki Alert matter FINDINGS: Alignment: Grade 1 anterolisthesis at C4-5 measures 3 mm. Straightening and slight reversal of the normal cervical lordosis is noted. Skull base and vertebrae: The craniocervical junction is within normal limits. Vertebral body  heights are maintained. No acute fractures are present. Chronic endplate degenerative changes are present at C5-6, C6-7 and C7-T1. Soft tissues and spinal canal: No prevertebral fluid or swelling. No visible canal hematoma. Minimal vascular calcifications are present at the carotid bifurcations bilaterally without significant stenosis. 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 Mild foraminal narrowing is present on the left at C5-6 and left greater than right at C7-T1 secondary to uncovertebral and facet disease. Upper chest: The lung apices are clear. The thoracic inlet is within normal limits. IMPRESSION: 1. No acute fracture or traumatic subluxation. 2. Grade 1 anterolisthesis at C4-5 measures  3 mm. 3. 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 4. Mild foraminal narrowing on the left at C5-6 and left greater than right at C7-T1 secondary to uncovertebral and facet disease. Electronically Signed   By: Audree Leas M.D.   On: 07/05/2023 14:10   DG Chest 2 View Result Date: 07/05/2023 CLINICAL DATA:  Chest pain. EXAM: CHEST - 2 VIEW COMPARISON:  09/19/2015. FINDINGS: Bilateral lung fields are clear. Bilateral costophrenic angles are clear. Note is made of elevated right hemidiaphragm. Normal cardio-mediastinal silhouette. No acute osseous abnormalities. The soft tissues are within normal limits. IMPRESSION: No active cardiopulmonary disease. Electronically Signed   By: Beula Brunswick M.D.   On: 07/05/2023 13:56   DG Bone Survey Met Result Date: 07/03/2023 EXAM: METASTATIC BONE SURVEY COMPARISON:  MRI pelvis January 14, 2023, bone survey of July 23, 2022 FINDINGS: AP and lateral views of the cervical thoracic and lumbar spine with AP views of the long bones, pelvis as well as AP view of the chest: Lateral view of the skull There is no evidence of lytic bone lesions. There is multilevel degenerative disc disease of the cervical spine lower thoracic and lumbar spine with large anterolateral syndesmophyte osteophytic changes There is no fractures IMPRESSION: No evidence of bone lytic changes to indicate multiple myeloma and/monoclonal gammopathy. Electronically Signed   By: Fredrich Jefferson M.D.   On: 07/03/2023 15:19    LABORATORY DATA:  I have reviewed the data as listed Lab Results  Component Value Date   WBC 8.4 07/18/2023   HGB 11.2 (L) 07/18/2023   HCT 33.4 (L) 07/18/2023   MCV 103.1 (H) 07/18/2023   PLT 265 07/18/2023   Recent Labs    04/02/23 1023 05/27/23 1516 07/05/23 1235 07/18/23 1028  NA 136 138 139 136  K 4.4 5.2 4.2 4.2  CL 103 102 110 107  CO2 22 22 16* 19*  GLUCOSE 127* 108* 114* 126*  BUN 21 22 33*  26*  CREATININE 1.90* 1.77* 2.11* 1.93*  CALCIUM  9.3 9.7 9.5 9.2  GFRNONAA 27*  --  23* 26*  PROT 6.6 6.5  --  6.4*  ALBUMIN 3.6 4.0  --  3.4*  AST 25 16  --  20  ALT 22 11  --  15  ALKPHOS 99 140*  --  129*  BILITOT 0.4 0.3  --  0.7   Iron/TIBC/Ferritin/ %Sat    Component Value Date/Time   IRON 56 04/23/2017 1644   TIBC 362 04/23/2017 1644   FERRITIN 19 04/23/2017 1644   IRONPCTSAT 15 04/23/2017 1644

## 2023-08-02 ENCOUNTER — Other Ambulatory Visit: Payer: Self-pay

## 2023-08-02 DIAGNOSIS — R0602 Shortness of breath: Secondary | ICD-10-CM

## 2023-08-07 ENCOUNTER — Ambulatory Visit (INDEPENDENT_AMBULATORY_CARE_PROVIDER_SITE_OTHER): Admitting: Pulmonary Disease

## 2023-08-07 ENCOUNTER — Ambulatory Visit: Admitting: Pulmonary Disease

## 2023-08-07 ENCOUNTER — Encounter: Payer: Self-pay | Admitting: Pulmonary Disease

## 2023-08-07 VITALS — BP 120/72 | HR 76 | Temp 97.6°F | Ht 63.0 in | Wt 180.8 lb

## 2023-08-07 DIAGNOSIS — R0602 Shortness of breath: Secondary | ICD-10-CM | POA: Diagnosis not present

## 2023-08-07 DIAGNOSIS — N184 Chronic kidney disease, stage 4 (severe): Secondary | ICD-10-CM | POA: Diagnosis not present

## 2023-08-07 DIAGNOSIS — Z9189 Other specified personal risk factors, not elsewhere classified: Secondary | ICD-10-CM

## 2023-08-07 DIAGNOSIS — E213 Hyperparathyroidism, unspecified: Secondary | ICD-10-CM | POA: Diagnosis not present

## 2023-08-07 DIAGNOSIS — C9000 Multiple myeloma not having achieved remission: Secondary | ICD-10-CM | POA: Diagnosis not present

## 2023-08-07 LAB — PULMONARY FUNCTION TEST
DL/VA % pred: 80 %
DL/VA: 3.35 ml/min/mmHg/L
DLCO cor % pred: 73 %
DLCO cor: 13.09 ml/min/mmHg
DLCO unc % pred: 67 %
DLCO unc: 12.11 ml/min/mmHg
FEF 25-75 Post: 3.4 L/s
FEF 25-75 Pre: 2.47 L/s
FEF2575-%Change-Post: 37 %
FEF2575-%Pred-Post: 251 %
FEF2575-%Pred-Pre: 182 %
FEV1-%Change-Post: 6 %
FEV1-%Pred-Post: 127 %
FEV1-%Pred-Pre: 119 %
FEV1-Post: 2.36 L
FEV1-Pre: 2.21 L
FEV1FVC-%Change-Post: 3 %
FEV1FVC-%Pred-Pre: 113 %
FEV6-%Change-Post: 3 %
FEV6-%Pred-Post: 114 %
FEV6-%Pred-Pre: 111 %
FEV6-Post: 2.7 L
FEV6-Pre: 2.62 L
FEV6FVC-%Change-Post: 0 %
FEV6FVC-%Pred-Post: 105 %
FEV6FVC-%Pred-Pre: 105 %
FVC-%Change-Post: 2 %
FVC-%Pred-Post: 108 %
FVC-%Pred-Pre: 105 %
FVC-Post: 2.7 L
FVC-Pre: 2.64 L
Post FEV1/FVC ratio: 87 %
Post FEV6/FVC ratio: 100 %
Pre FEV1/FVC ratio: 84 %
Pre FEV6/FVC Ratio: 99 %
RV % pred: 76 %
RV: 1.79 L
TLC % pred: 89 %
TLC: 4.41 L

## 2023-08-07 NOTE — Patient Instructions (Signed)
 VISIT SUMMARY:  Today, you were seen for shortness of breath and fatigue. We discussed your history of multiple myeloma and diabetes, which can contribute to your symptoms. You are actively working on weight loss and have started a conditioning program at the [facility]. We also reviewed your sleep disturbances and considered the possibility of sleep apnea. Your lung function is excellent, and your heart function is strong with slight relaxation issues. We discussed your anemia and its potential contribution to your fatigue.  YOUR PLAN:  -SLEEP DISTURBANCE: Sleep disturbance can cause you to feel unrested upon waking. We suspect sleep apnea might be contributing to your fatigue. To investigate further, we will arrange a home sleep study for three nights and ask you to complete a sleep questionnaire.  -DECONDITIONING: Deconditioning means your body is not as strong as it could be, which can cause shortness of breath and fatigue during physical activities. Your lung function is excellent, and you are encouraged to continue your conditioning program at the [facility], focusing on leg exercises.  -ANEMIA: Anemia is a condition where you have a lower than normal number of red blood cells, which can cause fatigue. We are monitoring your anemia, and there are no acute issues identified during this visit.  -DIABETES MELLITUS: Diabetes mellitus is a condition where your blood sugar levels are higher than normal, which can contribute to fatigue. We will continue to monitor your diabetes as it can affect your overall energy levels.  -MULTIPLE MYELOMA: Multiple myeloma is a type of blood cancer that can cause fatigue and other symptoms. You are currently being monitored by Dr. Wilhelmenia Harada, and a PET scan is scheduled for August to check on your condition.  INSTRUCTIONS:  Please complete the sleep questionnaire and undergo the home sleep study for three nights as discussed. Continue your conditioning program at the [facility], focusing  on leg exercises. Ensure you have your PET scan scheduled for August. Follow up with Dr. Wilhelmenia Harada for your multiple myeloma monitoring.

## 2023-08-07 NOTE — Progress Notes (Signed)
 Subjective:    Patient ID: Donna Malone, female    DOB: 10-02-1945, 78 y.o.   MRN: [id number]  Patient Care Team: Carlean Charter, DO as PCP - General (Family Medicine) Bary Boss., MD as Consulting Physician (Ophthalmology) Jerlyn Moons, MD as Consulting Physician (Orthopedic Surgery) Rica Chalet, MD (Nephrology) Timmy Forbes, MD as Consulting Physician (Oncology)  Chief Complaint  Patient presents with   Follow-up    Shortness of breath on exertion. No cough or wheezing.     BACKGROUND/INTERVAL: Donna Malone is a 78 year old lifelong never smoker who presents for evaluation of shortness of breath.  She also has fatigue.  Initially seen here on 25 June 2023, ambulatory oximetry did not show significant O2 desaturation at that time.  She presents today for follow-up after having had pulmonary function test.  HPI Discussed the use of AI scribe software for clinical note transcription with the patient, who gave verbal consent to proceed.  History of Present Illness   Donna Malone is an 78 year old female with multiple myeloma and diabetes who presents with shortness of breath and fatigue.  She experiences shortness of breath primarily during physical activities, which limits her ability to complete tasks and requires frequent rest. She is uncertain if this is due to leg weakness or another cause. She has been actively working on weight loss and has started a conditioning program at [employer], focusing on leg exercises.  She has a history of multiple myeloma and diabetes, both of which can contribute to her fatigue. She is not currently receiving treatment for multiple myeloma but is under monitoring with a PET scan scheduled for August. She also reports low blood counts, which are being monitored.  She takes trazodone  for sleep and notes that she does not sleep well without it. She used to snore and wake herself up before losing weight, but she is unsure if she still  snores. She does not feel rested upon waking in the morning.  She mentions having had a chest scan after a fall, which showed her lungs were in good condition. An echocardiogram indicated strong heart function but slight relaxation issues (diastolic dysfunction), we discussed these findings of the echocardiogram with the patient.  We also discussed her pulmonary function testing which was essentially normal.  DATA 07/05/2023 CT chest: Performed as part of trauma evaluation showed that the lungs had no abnormalities and no suspicious lung nodules.  No mediastinal lymphadenopathy. 07/29/2023 echocardiogram: LVEF 55 to 60%, normal LV function, no regional wall motion abnormalities.  RV function normal.  Mitral valve normal.  Aortic valve normal. 08/07/2023 PFTs: FEV1 2.21 L or 119% predicted, FVC 2.64 L, FEV1/FVC 84%.  No bronchodilator response.  Lung volumes slightly decreased with ERV being 7% indicative of obesity.  Diffusion capacity minimally decreased.  Spirometric values are normal, minimal pseudo restriction due to obesity.  Study does not explain the patient's symptoms.     Review of Systems A 10 point review of systems was performed and it is as noted above otherwise negative.   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 11/02/2022   Degeneration of L4-L5 intervertebral disc 11/02/2022  Umbilical hernia without obstruction and without gangrene 11/02/2022   Right hip pain 11/02/2022   Anemia 11/02/2022   Annual physical exam 05/01/2022   Appendicitis 03/14/2022   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   Positive  colorectal cancer screening using Cologuard test 08/29/2021   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   Yeast infection of the skin 08/02/2021   Panniculitis 08/02/2021   Hypotension 08/02/2021   Stage 4 chronic kidney disease (HCC) 07/28/2021   Goals of care, counseling/discussion 07/13/2021   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   Bone lesion 07/13/2021   Type 2 diabetes mellitus with stage 4 chronic kidney disease, with long-term current use of insulin  (HCC) 05/05/2021   Positive screening for depression on 2-item Patient Health Questionnaire (PHQ-2) 05/04/2021   Psychophysiological insomnia 05/04/2021   Diabetes mellitus due to underlying condition with stage 4 chronic kidney disease, with long-term current use of insulin  (HCC) 05/04/2021   Hyperlipidemia associated with type 2 diabetes mellitus (HCC) 05/04/2021   Need for influenza vaccination 05/04/2021   Need for shingles vaccine 05/04/2021   Right arm pain 10/22/2019   Degenerative arthritis of knee, bilateral 05/13/2018   Hypertension associated with diabetes (HCC) 04/01/2015   Leukocytosis 12/22/2014   Abnormal finding on mammography 09/21/2014   Obesity 09/21/2014   Accumulation of fluid in tissues 09/21/2014   Esophageal reflux 09/21/2014   Gout 09/21/2014   Hypercholesteremia 09/21/2014   Insomnia 09/21/2014   Vitamin D  deficiency, unspecified 09/21/2014   Hypothyroidism 01/15/2014   Low back pain 01/15/2014   Other chest pain 01/15/2014   Depression, recurrent (HCC) 01/15/2014    Social History   Tobacco Use   Smoking status: Never   Smokeless tobacco: Never  Substance Use Topics   Alcohol use: No    Allergies  Allergen Reactions   Sulfa Antibiotics Other (See Comments)    Current Meds  Medication Sig   Accu-Chek Softclix Lancets lancets Use as instructed   acetaminophen  (TYLENOL ) 500 MG tablet Take 500 mg by mouth every 6 (six) hours as needed for mild pain or headache.    atorvastatin  (LIPITOR) 40 MG tablet TAKE 1 TABLET BY MOUTH EVERY DAY   B Complex Vitamins (GNP VITAMIN B COMPLEX PO) Take 1 tablet by mouth daily.   blood glucose meter kit and supplies Dispense based on patient and insurance preference. Use once daily for FBG. (FOR ICD-10 E10.9, E11.9).   Blood Glucose Monitoring Suppl DEVI 1 each by Does not apply route in the morning, at noon, and at bedtime. May substitute to any manufacturer covered by patient's insurance.   cetirizine (ZYRTEC) 10 MG tablet Take 10 mg by mouth daily.   glucose blood (ACCU-CHEK GUIDE) test strip Use as instructed   KRILL OIL PO Take by mouth.   levothyroxine  (SYNTHROID ) 75 MCG tablet TAKE 1 TABLET BY MOUTH DAILY BEFORE BREAKFAST.   MAGnesium -Oxide 400 (240 Mg) MG tablet Take 1 tablet (400 mg total) by mouth daily.   meclizine  (ANTIVERT ) 25 MG tablet Take 1-2 tablets (25-50 mg total) by mouth 3 (three) times daily as needed for dizziness.   Multiple Vitamins-Minerals (PRESERVISION AREDS 2) CAPS Take 1 capsule by mouth in the morning and at bedtime.   ONETOUCH VERIO test strip USE IN THE MORNING, AT NOON, AND AT BEDTIME.   Polyethyl Glycol-Propyl Glycol (SYSTANE HYDRATION PF OP) Place 1 drop into both eyes daily as needed (for dry eyes).   Semaglutide , 1 MG/DOSE, 4 MG/3ML SOPN Inject 1 mg as  directed once a week.   traZODone  (DESYREL ) 50 MG tablet TAKE 1 TABLET BY MOUTH EVERYDAY AT BEDTIME   [DISCONTINUED] allopurinol  (ZYLOPRIM ) 100 MG tablet TAKE 1 TABLET BY MOUTH EVERY DAY   [DISCONTINUED] escitalopram  (LEXAPRO ) 20 MG tablet Take 1 tablet (20 mg total) by mouth daily.   [DISCONTINUED] famotidine  (PEPCID ) 40 MG tablet Take 1 tablet (40 mg total) by mouth daily.   [DISCONTINUED] Vitamin D , Ergocalciferol , (DRISDOL ) 1.25 MG (50000 UNIT) CAPS capsule TAKE 1 CAPSULE (50,000 UNITS TOTAL) BY MOUTH EVERY 7 (SEVEN) DAYS. SUNDAY    Immunization History  Administered Date(s) Administered   Fluad Quad(high Dose 65+) 04/20/2019,  05/04/2021   Influenza Split 12/16/2009, 03/21/2011, 01/30/2012   Influenza, High Dose Seasonal PF 02/09/2015, 03/20/2016, 12/24/2016, 02/07/2018, 01/28/2023   Influenza,inj,Quad PF,6+ Mos 02/06/2013, 12/03/2013   PFIZER(Purple Top)SARS-COV-2 Vaccination 06/19/2019, 07/14/2019, 03/18/2020   Pneumococcal Conjugate-13 12/03/2013   Pneumococcal Polysaccharide-23 03/21/2011   Tdap 04/09/2008, 05/04/2021   Zoster Recombinant(Shingrix) 05/04/2021        Objective:     BP 120/72 (BP Location: Left Arm, Patient Position: Sitting, Cuff Size: Normal)   Pulse 76   Temp 97.6 F (36.4 C) (Temporal)   Ht 5\' 3"  (1.6 m)   Wt 180 lb 12.8 oz (82 kg)   SpO2 96%   BMI 32.03 kg/m   SpO2: 96 %  GENERAL: Overweight woman, no acute distress, fully ambulatory.  No conversational dyspnea. HEAD: Normocephalic, atraumatic.  EYES: Pupils equal, round, reactive to light.  No scleral icterus.  MOUTH: Wears upper dentures, oral mucosa moist.  No thrush. NECK: Supple. No thyromegaly. Trachea midline. No JVD.  No adenopathy. PULMONARY: Good air entry bilaterally.  No adventitious sounds. CARDIOVASCULAR: S1 and S2. Regular rate and rhythm.  No rubs, murmurs or gallops heard. ABDOMEN: Obese, otherwise benign. MUSCULOSKELETAL: No joint deformity, no clubbing, no edema.  NEUROLOGIC: No overt focal deficit, no gait disturbance, speech is fluent. SKIN: Intact,warm,dry. PSYCH: Mood and behavior normal.     08/07/2023   11:04 AM  Results of the Epworth flowsheet  Sitting and reading 1  Watching TV 1  Sitting, inactive in a public place (e.g. a theatre or a meeting) 0  As a passenger in a car for an hour without a break 0  Lying down to rest in the afternoon when circumstances permit 3  Sitting and talking to someone 0  Sitting quietly after a lunch without alcohol 0  In a car, while stopped for a few minutes in traffic 0  Total score 5       Assessment & Plan:     ICD-10-CM   1. Shortness of breath   R06.02     2. Stage 4 chronic kidney disease (HCC)  N18.4     3. Hyperparathyroidism (HCC)  E21.3     4. Multiple myeloma, remission status unspecified (HCC)  C90.00     5. At risk for sleep apnea  Z91.89 Home sleep test      Orders Placed This Encounter  Procedures   Home sleep test    Standing Status:   Future    Expected Date:   08/21/2023    Expiration Date:   08/06/2024    Where should this test be performed::   [facility] - Pul[facility]   Discussion:    Sleep disturbance Reports not feeling rested upon waking. Snoring previously caused self-awakening before weight loss. Claustrophobia may impact willingness to undergo certain tests. Potential sleep apnea considered as a cause of fatigue. She  is hesitant about sleep study due to claustrophobia but is open to a home sleep study. - Order home sleep study for three nights - Complete sleep questionnaire  Deconditioning Experiences shortness of breath and fatigue upon exertion, likely due to deconditioning. Lung function tests show excellent results with lung capacity at 119%. Encouraged to continue conditioning program, focusing on leg exercises. Weight loss efforts contributing positively to overall health. - Continue conditioning program with focus on leg exercises  Anemia Anemia noted as a potential contributor to fatigue. Anemia is being monitored, and no acute issues identified during this visit.  Diabetes mellitus Diabetes mellitus mentioned as a factor contributing to fatigue.  Multiple myeloma Multiple myeloma is being monitored by Dr. Wilhelmenia Harada. No active treatment currently, but a PET scan is scheduled for August. - Schedule PET scan in August      Advised if symptoms do not improve or worsen, to please contact office for sooner follow up or seek emergency care.    I spent 30 minutes of dedicated to the care of this patient on the date of this encounter to include pre-visit review of records, face-to-face time with the patient  discussing conditions above, post visit ordering of testing, clinical documentation with the electronic health record, making appropriate referrals as documented, and communicating necessary findings to members of the patients care team.     C. Chloe Counter, MD Advanced Bronchoscopy P[facility]    *This note was generated using voice recognition software/Dragon and/or AI transcription program.  Despite best efforts to proofread, errors can occur which can change the meaning. Any transcriptional errors that result from this process are unintentional and may not be fully corrected at the time of dictation.

## 2023-08-07 NOTE — Patient Instructions (Signed)
 Full PFT completed today ? ?

## 2023-08-07 NOTE — Progress Notes (Signed)
 Full PFT completed today ? ?

## 2023-08-17 ENCOUNTER — Other Ambulatory Visit: Payer: Self-pay | Admitting: Family Medicine

## 2023-08-17 DIAGNOSIS — F3342 Major depressive disorder, recurrent, in full remission: Secondary | ICD-10-CM

## 2023-08-17 DIAGNOSIS — K219 Gastro-esophageal reflux disease without esophagitis: Secondary | ICD-10-CM

## 2023-08-19 ENCOUNTER — Telehealth: Payer: Self-pay | Admitting: Family Medicine

## 2023-08-19 ENCOUNTER — Other Ambulatory Visit: Payer: Self-pay

## 2023-08-19 DIAGNOSIS — M5416 Radiculopathy, lumbar region: Secondary | ICD-10-CM | POA: Diagnosis not present

## 2023-08-19 DIAGNOSIS — M9903 Segmental and somatic dysfunction of lumbar region: Secondary | ICD-10-CM | POA: Diagnosis not present

## 2023-08-19 DIAGNOSIS — M5136 Other intervertebral disc degeneration, lumbar region with discogenic back pain only: Secondary | ICD-10-CM | POA: Diagnosis not present

## 2023-08-19 DIAGNOSIS — M109 Gout, unspecified: Secondary | ICD-10-CM

## 2023-08-19 DIAGNOSIS — M9905 Segmental and somatic dysfunction of pelvic region: Secondary | ICD-10-CM | POA: Diagnosis not present

## 2023-08-19 MED ORDER — ALLOPURINOL 100 MG PO TABS: 100.0000 mg | ORAL_TABLET | Freq: Every day | ORAL | 1 refills | Status: DC

## 2023-08-19 NOTE — Telephone Encounter (Signed)
 [facility] is requesting refill allopurinol (ZYLOPRIM) 100 MG tablet   Please advise

## 2023-08-19 NOTE — Telephone Encounter (Signed)
Converted to refill request 

## 2023-08-22 ENCOUNTER — Other Ambulatory Visit: Payer: Self-pay | Admitting: Family Medicine

## 2023-08-22 DIAGNOSIS — E559 Vitamin D deficiency, unspecified: Secondary | ICD-10-CM

## 2023-08-23 ENCOUNTER — Ambulatory Visit: Payer: PPO | Admitting: Family Medicine

## 2023-09-05 ENCOUNTER — Other Ambulatory Visit: Payer: Self-pay | Admitting: Family Medicine

## 2023-09-05 DIAGNOSIS — F5104 Psychophysiologic insomnia: Secondary | ICD-10-CM

## 2023-09-05 DIAGNOSIS — E7800 Pure hypercholesterolemia, unspecified: Secondary | ICD-10-CM

## 2023-09-13 ENCOUNTER — Other Ambulatory Visit: Payer: Self-pay

## 2023-09-13 ENCOUNTER — Ambulatory Visit: Admitting: Pulmonary Disease

## 2023-09-13 ENCOUNTER — Encounter: Payer: Self-pay | Admitting: Family Medicine

## 2023-09-13 ENCOUNTER — Ambulatory Visit (INDEPENDENT_AMBULATORY_CARE_PROVIDER_SITE_OTHER): Admitting: Family Medicine

## 2023-09-13 VITALS — BP 117/73 | HR 74 | Resp 14 | Ht 63.0 in | Wt 179.9 lb

## 2023-09-13 DIAGNOSIS — G8929 Other chronic pain: Secondary | ICD-10-CM

## 2023-09-13 DIAGNOSIS — E1169 Type 2 diabetes mellitus with other specified complication: Secondary | ICD-10-CM | POA: Diagnosis not present

## 2023-09-13 DIAGNOSIS — Z7985 Long-term (current) use of injectable non-insulin antidiabetic drugs: Secondary | ICD-10-CM

## 2023-09-13 DIAGNOSIS — R200 Anesthesia of skin: Secondary | ICD-10-CM | POA: Diagnosis not present

## 2023-09-13 DIAGNOSIS — D649 Anemia, unspecified: Secondary | ICD-10-CM

## 2023-09-13 DIAGNOSIS — E1122 Type 2 diabetes mellitus with diabetic chronic kidney disease: Secondary | ICD-10-CM | POA: Diagnosis not present

## 2023-09-13 DIAGNOSIS — E785 Hyperlipidemia, unspecified: Secondary | ICD-10-CM | POA: Diagnosis not present

## 2023-09-13 DIAGNOSIS — N184 Chronic kidney disease, stage 4 (severe): Secondary | ICD-10-CM | POA: Diagnosis not present

## 2023-09-13 DIAGNOSIS — E559 Vitamin D deficiency, unspecified: Secondary | ICD-10-CM

## 2023-09-13 DIAGNOSIS — Z23 Encounter for immunization: Secondary | ICD-10-CM

## 2023-09-13 DIAGNOSIS — K5900 Constipation, unspecified: Secondary | ICD-10-CM

## 2023-09-13 DIAGNOSIS — M5441 Lumbago with sciatica, right side: Secondary | ICD-10-CM | POA: Diagnosis not present

## 2023-09-13 DIAGNOSIS — M5442 Lumbago with sciatica, left side: Secondary | ICD-10-CM

## 2023-09-13 DIAGNOSIS — E538 Deficiency of other specified B group vitamins: Secondary | ICD-10-CM

## 2023-09-13 MED ORDER — DOCUSATE SODIUM 100 MG PO CAPS
100.0000 mg | ORAL_CAPSULE | Freq: Two times a day (BID) | ORAL | 0 refills | Status: AC
  Filled 2023-09-13: qty 60, 30d supply, fill #0

## 2023-09-13 MED ORDER — SEMAGLUTIDE (1 MG/DOSE) 4 MG/3ML ~~LOC~~ SOPN
1.0000 mg | PEN_INJECTOR | SUBCUTANEOUS | 3 refills | Status: DC
  Filled 2023-09-13: qty 3, 28d supply, fill #0
  Filled 2023-10-16 (×2): qty 3, 28d supply, fill #1
  Filled 2023-11-18: qty 3, 28d supply, fill #2
  Filled 2023-12-18: qty 3, 28d supply, fill #3
  Filled 2024-01-16: qty 3, 28d supply, fill #4
  Filled 2024-02-14: qty 3, 28d supply, fill #5
  Filled 2024-03-12: qty 3, 28d supply, fill #6
  Filled 2024-04-17: qty 3, 28d supply, fill #7

## 2023-09-13 MED ORDER — ZOSTER VAC RECOMB ADJUVANTED 50 MCG/0.5ML IM SUSR
0.5000 mL | Freq: Once | INTRAMUSCULAR | 0 refills | Status: AC
Start: 2023-09-13 — End: 2023-09-13

## 2023-09-13 NOTE — Patient Instructions (Addendum)
 Please request you eye doctor send us  a copy of your exam!  Recommend getting the second Shingrix (shingles) vaccine at the pharmacy.   Sending colace (stool softener) to your pharmacy. I also recommend you take Benefiber or Metamucil to help increase stool bulk. Be sure you are drinking plenty of fluids!   - if these do not start relieving your constipation, you can consider taking MiraLAX or senna, which are available over-the-counter

## 2023-09-13 NOTE — Progress Notes (Unsigned)
 Established patient visit   Patient: Donna Malone   DOB: 02-21-1946   78 y.o. Female  MRN: [id number] Visit Date: 09/13/2023  Today's healthcare provider: Carlean Charter, DO   Chief Complaint  Patient presents with   Follow-up   Subjective    HPI Donna Malone is an 78 year old female with diabetes who presents for a follow-up visit.  She has chronic back pain, which she attributes to musculoskeletal issues. She usually visits a chiropractor monthly but has not been recently. She plans to resume her chiropractic visits next week.  She experienced numbness over her eye following a visit to the ER, describing it as a 'funny feeling'. She has since followed up with neurosurgery and reports no further issues.  She mentions discomfort in her calf, describing it as 'getting under her feet all the time'.  She had an eye exam earlier in the year at [name] in [place] and plans to return in August (she sees [name]). She undergoes eye exams twice a year.  She is managing her diabetes with Ozempic , taking a 1 mg dose. Her blood sugars are generally well-controlled, ranging from 90 to 120 mg/dL. She inquires about obtaining a three-month supply and mentions an incident where she unfortunately wasted a shot while traveling.  Her magnesium  levels were previously low, and she is currently taking a magnesium  supplement in gummy form daily. She also takes vitamin D  and a B complex supplement.  She recalls being told she was anemic and wonders if it could be related to her cancer. She has a PET scan scheduled for August.  She has been experiencing constipation for about a month, with hard and painful bowel movements, and suspects her hemorrhoids are swollen. She has not taken any laxatives yet.  She takes atorvastatin  for cholesterol management and mentions being tired and lacking energy, which she attributes to low vitamin D  levels. She has a lady come to clean her house  once a month due to fatigue.  She is taking a hair, skin, and nails supplement and is concerned about its possible impact on her kidneys.      Medications: Outpatient Medications Prior to Visit  Medication Sig   Accu-Chek Softclix Lancets lancets Use as instructed   acetaminophen  (TYLENOL ) 500 MG tablet Take 500 mg by mouth every 6 (six) hours as needed for mild pain or headache.   allopurinol  (ZYLOPRIM ) 100 MG tablet Take 1 tablet (100 mg total) by mouth daily.   atorvastatin  (LIPITOR) 40 MG tablet TAKE 1 TABLET BY MOUTH EVERY DAY   B Complex Vitamins (GNP VITAMIN B COMPLEX PO) Take 1 tablet by mouth daily.   blood glucose meter kit and supplies Dispense based on patient and insurance preference. Use once daily for FBG. (FOR ICD-10 E10.9, E11.9).   Blood Glucose Monitoring Suppl DEVI 1 each by Does not apply route in the morning, at noon, and at bedtime. May substitute to any manufacturer covered by patient's insurance.   cetirizine (ZYRTEC) 10 MG tablet Take 10 mg by mouth daily.   escitalopram  (LEXAPRO ) 20 MG tablet TAKE 1 TABLET BY MOUTH EVERY DAY   famotidine  (PEPCID ) 40 MG tablet TAKE 1 TABLET BY MOUTH EVERY DAY   glucose blood (ACCU-CHEK GUIDE) test strip Use as instructed   KRILL OIL PO Take by mouth.   levothyroxine  (SYNTHROID ) 75 MCG tablet TAKE 1 TABLET BY MOUTH DAILY BEFORE BREAKFAST.   MAGnesium -Oxide 400 (240 Mg) MG tablet  Take 1 tablet (400 mg total) by mouth daily.   meclizine  (ANTIVERT ) 25 MG tablet Take 1-2 tablets (25-50 mg total) by mouth 3 (three) times daily as needed for dizziness.   Multiple Vitamins-Minerals (HAIR SKIN & NAILS) TABS Take by mouth.   Multiple Vitamins-Minerals (PRESERVISION AREDS 2) CAPS Take 1 capsule by mouth in the morning and at bedtime.   ONETOUCH VERIO test strip USE IN THE MORNING, AT NOON, AND AT BEDTIME.   Polyethyl Glycol-Propyl Glycol (SYSTANE HYDRATION PF OP) Place 1 drop into both eyes daily as needed (for dry eyes).   traZODone   (DESYREL ) 50 MG tablet TAKE 1 TABLET BY MOUTH EVERYDAY AT BEDTIME   Vitamin D , Ergocalciferol , (DRISDOL ) 1.25 MG (50000 UNIT) CAPS capsule TAKE 1 CAPSULE (50,000 UNITS TOTAL) BY MOUTH EVERY 7 (SEVEN) DAYS. SUNDAY   [DISCONTINUED] Semaglutide , 1 MG/DOSE, 4 MG/3ML SOPN Inject 1 mg as directed once a week.   No facility-administered medications prior to visit.        Objective    BP 117/73 (BP Location: Left Arm, Patient Position: Sitting, Cuff Size: Normal)   Pulse 74   Resp 14   Ht 5' 3 (1.6 m)   Wt 179 lb 14.4 oz (81.6 kg)   SpO2 100%   BMI 31.87 kg/m     Physical Exam Vitals and nursing note reviewed.  Constitutional:      General: She is not in acute distress.    Appearance: Normal appearance.  HENT:     Head: Normocephalic and atraumatic.   Eyes:     General: No scleral icterus.    Conjunctiva/sclera: Conjunctivae normal.    Cardiovascular:     Rate and Rhythm: Normal rate.  Pulmonary:     Effort: Pulmonary effort is normal.   Neurological:     Mental Status: She is alert and oriented to person, place, and time. Mental status is at baseline.   Psychiatric:        Mood and Affect: Mood normal.        Behavior: Behavior normal.      Results for orders placed or performed in visit on 09/13/23  Hemoglobin A1c  Result Value Ref Range   Hgb A1c MFr Bld 5.7 (H) 4.8 - 5.6 %   Est. average glucose Bld gHb Est-mCnc 117 mg/dL  Magnesium   Result Value Ref Range   Magnesium  2.3 1.6 - 2.3 mg/dL  Iron, TIBC and Ferritin Panel  Result Value Ref Range   Total Iron Binding Capacity 285 250 - 450 ug/dL   UIBC 161 096 - 045 ug/dL   Iron 56 27 - 409 ug/dL   Iron Saturation 20 15 - 55 %   Ferritin 159 (H) 15 - 150 ng/mL  B12 and Folate Panel  Result Value Ref Range   Vitamin B-12 1,141 232 - 1,245 pg/mL   Folate >20.0 >3.0 ng/mL    Assessment & Plan    Type 2 diabetes mellitus with stage 4 chronic kidney disease, without long-term current use of insulin  (HCC) -      Semaglutide  (1 MG/DOSE); Inject 1 mg as directed once a week.  Dispense: 9 mL; Refill: 3 -     Hemoglobin A1c  Chronic right-sided low back pain with bilateral sciatica  Facial numbness  Hyperlipidemia associated with type 2 diabetes mellitus (HCC)  Anemia, unspecified type -     Iron, TIBC and Ferritin Panel -     B12 and Folate Panel  Constipation, unspecified constipation type -  Docusate Sodium; Take 1 capsule (100 mg total) by mouth 2 (two) times daily. HOLD dose if having loose stools.  Dispense: 60 capsule; Refill: 0  Hypomagnesemia -     Magnesium   Vitamin D  deficiency, unspecified  Need for shingles vaccine -     Zoster Vac Recomb Adjuvanted; Inject 0.5 mLs into the muscle once for 1 dose.  Dispense: 0.5 mL; Refill: 0      Diabetes Mellitus with stage 4 chronic kidney disease  Type 2 diabetes well-controlled with blood sugars 90-120 mg/dL. On Ozempic  and atorvastatin . Discussed 90-day semaglutide  supply pending insurance approval. - Send prescription for 90-day supply of semaglutide  to the hospital pharmacy. - Check A1c level.  Back Pain Chronic musculoskeletal back pain, stable since last ER visit. - Continue monthly chiropractic visits.  Facial Numbness Persistent numbness over the eye post-incident, stable with no new issues per neurosurgery follow-up.  Hyperlipidemia Mildly elevated LDL at 82 mg/dL (goal <82 due to diabetes). On atorvastatin . No dose increase due to age and minimal elevation. Emphasized dietary and activity modifications. - Encourage dietary modifications and physical activity.  Anemia Mild microcytic anemia, possibly related to cancer history. Potential B12 or folate deficiency. - Order B12 and folate levels. - Order iron panel.  Constipation Chronic constipation with hard stools and possible hemorrhoidal swelling. Discussed Colace and increased fluid intake. - Prescribe Colace (docusate sodium) twice daily, with option to  increase to three times daily if needed. - Encourage increased fluid intake. - Consider Benefiber or Metamucil for fiber supplementation. - Advise on potential use of MiraLAX or Senna if needed.  Hypomagnesemia Previously low magnesium  levels, currently supplemented with OTC magnesium  gummies. - Order magnesium  level to monitor current status.  Vitamin D  Deficiency Taking vitamin D  supplements. Re-evaluation premature.  Defer to subsequent blood draw.  Need for shingles vaccine Send prescription for Shingrix to patient's pharmacy.  General Health Maintenance Declined further COVID-19 vaccinations. Needs second shingles vaccine dose. Regular biannual eye exams. Discussed obtaining shingles vaccine from drugstore due to Medicare restrictions. - Advise obtaining second shingles vaccine from drugstore. - Request records from eye specialist for recent eye exam.    Follow-up Routine follow-up for ongoing health issues and medication management. Upcoming PET scan and blood work in August. - Plan follow-up in three months. - Provide orders for blood work including A1c, B12, folate, iron panel, and magnesium  levels.  Return in about 3 months (around 12/14/2023) for mAWV with AWV nurse if possible and Chronic f/u w/PCP.      I discussed the assessment and treatment plan with the patient  The patient was provided an opportunity to ask questions and all were answered. The patient agreed with the plan and demonstrated an understanding of the instructions.   The patient was advised to call back or seek an in-person evaluation if the symptoms worsen or if the condition fails to improve as anticipated.    Carlean Charter, DO  [facility] [phone] (phone) [phone] (fax)  [facility]

## 2023-09-14 LAB — IRON,TIBC AND FERRITIN PANEL
Ferritin: 159 ng/mL — ABNORMAL HIGH (ref 15–150)
Iron Saturation: 20 % (ref 15–55)
Iron: 56 ug/dL (ref 27–139)
Total Iron Binding Capacity: 285 ug/dL (ref 250–450)
UIBC: 229 ug/dL (ref 118–369)

## 2023-09-14 LAB — B12 AND FOLATE PANEL
Folate: >20 ng/mL (ref >3.0)
Vitamin B-12: 1141 pg/mL (ref 232–1245)

## 2023-09-14 LAB — MAGNESIUM: Magnesium: 2.3 mg/dL (ref 1.6–2.3)

## 2023-09-14 LAB — HEMOGLOBIN A1C
Est. average glucose Bld gHb Est-mCnc: 117 mg/dL
Hgb A1c MFr Bld: 5.7 % — ABNORMAL HIGH (ref 4.8–5.6)

## 2023-09-16 DIAGNOSIS — M5416 Radiculopathy, lumbar region: Secondary | ICD-10-CM | POA: Diagnosis not present

## 2023-09-16 DIAGNOSIS — M9903 Segmental and somatic dysfunction of lumbar region: Secondary | ICD-10-CM | POA: Diagnosis not present

## 2023-09-16 DIAGNOSIS — M5136 Other intervertebral disc degeneration, lumbar region with discogenic back pain only: Secondary | ICD-10-CM | POA: Diagnosis not present

## 2023-09-16 DIAGNOSIS — M9905 Segmental and somatic dysfunction of pelvic region: Secondary | ICD-10-CM | POA: Diagnosis not present

## 2023-09-17 ENCOUNTER — Ambulatory Visit: Payer: Self-pay | Admitting: Family Medicine

## 2023-09-24 ENCOUNTER — Other Ambulatory Visit: Payer: Self-pay

## 2023-10-02 DIAGNOSIS — N1832 Chronic kidney disease, stage 3b: Secondary | ICD-10-CM | POA: Diagnosis not present

## 2023-10-02 DIAGNOSIS — N2581 Secondary hyperparathyroidism of renal origin: Secondary | ICD-10-CM | POA: Diagnosis not present

## 2023-10-02 DIAGNOSIS — D472 Monoclonal gammopathy: Secondary | ICD-10-CM | POA: Diagnosis not present

## 2023-10-02 DIAGNOSIS — E1122 Type 2 diabetes mellitus with diabetic chronic kidney disease: Secondary | ICD-10-CM | POA: Diagnosis not present

## 2023-10-10 ENCOUNTER — Other Ambulatory Visit: Payer: Self-pay | Admitting: Family Medicine

## 2023-10-14 DIAGNOSIS — M9905 Segmental and somatic dysfunction of pelvic region: Secondary | ICD-10-CM | POA: Diagnosis not present

## 2023-10-14 DIAGNOSIS — M5136 Other intervertebral disc degeneration, lumbar region with discogenic back pain only: Secondary | ICD-10-CM | POA: Diagnosis not present

## 2023-10-14 DIAGNOSIS — M5416 Radiculopathy, lumbar region: Secondary | ICD-10-CM | POA: Diagnosis not present

## 2023-10-14 DIAGNOSIS — M9903 Segmental and somatic dysfunction of lumbar region: Secondary | ICD-10-CM | POA: Diagnosis not present

## 2023-10-16 ENCOUNTER — Other Ambulatory Visit: Payer: Self-pay

## 2023-11-01 ENCOUNTER — Ambulatory Visit
Admission: RE | Admit: 2023-11-01 | Discharge: 2023-11-01 | Disposition: A | Discharge status: Home / Self Care | Source: Ambulatory Visit | Attending: Oncology | Admitting: Oncology

## 2023-11-01 ENCOUNTER — Other Ambulatory Visit

## 2023-11-01 ENCOUNTER — Inpatient Hospital Stay: Attending: Oncology

## 2023-11-01 DIAGNOSIS — I700 Atherosclerosis of aorta: Secondary | ICD-10-CM | POA: Insufficient documentation

## 2023-11-01 DIAGNOSIS — D72820 Lymphocytosis (symptomatic): Secondary | ICD-10-CM | POA: Diagnosis not present

## 2023-11-01 DIAGNOSIS — C9000 Multiple myeloma not having achieved remission: Secondary | ICD-10-CM | POA: Insufficient documentation

## 2023-11-01 DIAGNOSIS — N184 Chronic kidney disease, stage 4 (severe): Secondary | ICD-10-CM | POA: Diagnosis not present

## 2023-11-01 LAB — CBC WITH DIFFERENTIAL (CANCER CENTER ONLY)
Abs Immature Granulocytes: 0.02 K/uL (ref 0.00–0.07)
Basophils Absolute: 0.1 K/uL (ref 0.0–0.1)
Basophils Relative: 1 %
Eosinophils Absolute: 0.3 K/uL (ref 0.0–0.5)
Eosinophils Relative: 4 %
HCT: 33.8 % — ABNORMAL LOW (ref 36.0–46.0)
Hemoglobin: 11.4 g/dL — ABNORMAL LOW (ref 12.0–15.0)
Immature Granulocytes: 0 %
Lymphocytes Relative: 37 %
Lymphs Abs: 2.9 K/uL (ref 0.7–4.0)
MCH: 34.2 pg — ABNORMAL HIGH (ref 26.0–34.0)
MCHC: 33.7 g/dL (ref 30.0–36.0)
MCV: 101.5 fL — ABNORMAL HIGH (ref 80.0–100.0)
Monocytes Absolute: 0.6 K/uL (ref 0.1–1.0)
Monocytes Relative: 7 %
Neutro Abs: 4.1 K/uL (ref 1.7–7.7)
Neutrophils Relative %: 51 %
Platelet Count: 225 K/uL (ref 150–400)
RBC: 3.33 MIL/uL — ABNORMAL LOW (ref 3.87–5.11)
RDW: 13.2 % (ref 11.5–15.5)
WBC Count: 8 K/uL (ref 4.0–10.5)
nRBC: 0 % (ref 0.0–0.2)

## 2023-11-01 LAB — CMP (CANCER CENTER ONLY)
ALT: 19 U/L (ref 0–44)
AST: 23 U/L (ref 15–41)
Albumin: 3.7 g/dL (ref 3.5–5.0)
Alkaline Phosphatase: 95 U/L (ref 38–126)
Anion gap: 5 (ref 5–15)
BUN: 22 mg/dL (ref 8–23)
CO2: 23 mmol/L (ref 22–32)
Calcium: 9.2 mg/dL (ref 8.9–10.3)
Chloride: 106 mmol/L (ref 98–111)
Creatinine: 1.67 mg/dL — ABNORMAL HIGH (ref 0.44–1.00)
GFR, Estimated: 31 mL/min — ABNORMAL LOW (ref >60)
Glucose, Bld: 128 mg/dL — ABNORMAL HIGH (ref 70–99)
Potassium: 4 mmol/L (ref 3.5–5.1)
Sodium: 134 mmol/L — ABNORMAL LOW (ref 135–145)
Total Bilirubin: 0.6 mg/dL (ref 0.0–1.2)
Total Protein: 6.6 g/dL (ref 6.5–8.1)

## 2023-11-01 LAB — GLUCOSE, CAPILLARY: Glucose-Capillary: 135 mg/dL — ABNORMAL HIGH (ref 70–99)

## 2023-11-01 MED ORDER — FLUDEOXYGLUCOSE F - 18 (FDG) INJECTION
10.0400 | Freq: Once | INTRAVENOUS | Status: AC | PRN
  Administered 2023-11-01: 10.04 via INTRAVENOUS

## 2023-11-04 LAB — MULTIPLE MYELOMA PANEL, SERUM
Albumin SerPl Elph-Mcnc: 3.3 g/dL (ref 2.9–4.4)
Albumin/Glob SerPl: 1.2 (ref 0.7–1.7)
Alpha 1: 0.3 g/dL (ref 0.0–0.4)
Alpha2 Glob SerPl Elph-Mcnc: 1.1 g/dL — ABNORMAL HIGH (ref 0.4–1.0)
B-Globulin SerPl Elph-Mcnc: 1.1 g/dL (ref 0.7–1.3)
Gamma Glob SerPl Elph-Mcnc: 0.5 g/dL (ref 0.4–1.8)
Globulin, Total: 3 g/dL (ref 2.2–3.9)
IgA: 577 mg/dL — ABNORMAL HIGH (ref 64–422)
IgG (Immunoglobin G), Serum: 478 mg/dL — ABNORMAL LOW (ref 586–1602)
IgM (Immunoglobulin M), Srm: 101 mg/dL (ref 26–217)
Total Protein ELP: 6.3 g/dL (ref 6.0–8.5)

## 2023-11-04 LAB — KAPPA/LAMBDA LIGHT CHAINS
Kappa free light chain: 188.7 mg/L — ABNORMAL HIGH (ref 3.3–19.4)
Kappa, lambda light chain ratio: 7.43 — ABNORMAL HIGH (ref 0.26–1.65)
Lambda free light chains: 25.4 mg/L (ref 5.7–26.3)

## 2023-11-06 LAB — IFE+PROTEIN ELECTRO, 24-HR UR
% BETA, Urine: 58.1 %
ALPHA 1 URINE: 3 %
Albumin, U: 29.7 %
Alpha 2, Urine: 3.6 %
GAMMA GLOBULIN URINE: 5.5 %
M-SPIKE %, Urine: 49.7 % — ABNORMAL HIGH
M-Spike, Mg/24 Hr: 193 mg/(24.h) — ABNORMAL HIGH
Total Protein, Urine-Ur/day: 388 mg/(24.h) — ABNORMAL HIGH (ref 30–150)
Total Protein, Urine: 16.5 mg/dL
Total Volume: 2350

## 2023-11-15 ENCOUNTER — Ambulatory Visit: Admitting: Oncology

## 2023-11-18 ENCOUNTER — Encounter: Payer: Self-pay | Admitting: Oncology

## 2023-11-18 ENCOUNTER — Inpatient Hospital Stay (HOSPITAL_BASED_OUTPATIENT_CLINIC_OR_DEPARTMENT_OTHER): Admitting: Oncology

## 2023-11-18 ENCOUNTER — Other Ambulatory Visit: Payer: Self-pay

## 2023-11-18 VITALS — BP 125/77 | HR 71 | Temp 96.7°F | Resp 18 | Wt 181.2 lb

## 2023-11-18 DIAGNOSIS — M5416 Radiculopathy, lumbar region: Secondary | ICD-10-CM | POA: Diagnosis not present

## 2023-11-18 DIAGNOSIS — M5136 Other intervertebral disc degeneration, lumbar region with discogenic back pain only: Secondary | ICD-10-CM | POA: Diagnosis not present

## 2023-11-18 DIAGNOSIS — M9905 Segmental and somatic dysfunction of pelvic region: Secondary | ICD-10-CM | POA: Diagnosis not present

## 2023-11-18 DIAGNOSIS — M9903 Segmental and somatic dysfunction of lumbar region: Secondary | ICD-10-CM | POA: Diagnosis not present

## 2023-11-18 DIAGNOSIS — C9000 Multiple myeloma not having achieved remission: Secondary | ICD-10-CM | POA: Diagnosis not present

## 2023-11-18 DIAGNOSIS — D472 Monoclonal gammopathy: Secondary | ICD-10-CM | POA: Diagnosis not present

## 2023-11-18 DIAGNOSIS — N184 Chronic kidney disease, stage 4 (severe): Secondary | ICD-10-CM

## 2023-11-18 DIAGNOSIS — D72820 Lymphocytosis (symptomatic): Secondary | ICD-10-CM | POA: Diagnosis not present

## 2023-11-18 NOTE — Progress Notes (Signed)
 Hematology/Oncology Progress note [phone] [phone]      CHIEF COMPLAINTS/REASON FOR VISIT:  Smoldering Multiple myeloma, monoclonal lymphocytosis.   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  M protein on UPEP has decreased.  Patient has no  hypercalcemia, Hb>10, no bone lesions on x-ray skeletal survey as well as PET scan.  Recommend her to follow up with nephrology. If renal function is progressively worse, can consider empirically try MM treatment to see if that helps to improve renal function,    Lab Results  Component Value Date   MPROTEIN Comment (A) 11/01/2023   KPAFRELGTCHN 188.7 (H) 11/01/2023   LAMBDASER 25.4 11/01/2023   KAPLAMBRATIO 7.43 (H) 11/01/2023    Labs reviewed and discussed with patient, urine M protein is progressing.  stable kidney function, normal calcium , slight decrease of hemoglobin. Patient declines kidney biopsy. No absolute evidence of organ damage.  PET showed no bone metastatic disease.  I recommend continue observation.follow up in 3 months.   Monoclonal B-cell lymphocytosis of unknown significance Minute clone of B cells. 1 % cells <5000 per ul. Continue observation.  Stable and normal total WBC and lymphocyte count.    Stage 4 chronic kidney disease (HCC) Encourage oral hydration avoid nephrotoxins.   Continue follow-up with nephrology.  Orders Placed This Encounter  Procedures   CBC with Differential (Cancer Center Only)    Standing Status:   Future    Expected Date:   03/19/2024     Expiration Date:   06/17/2024   CMP (Cancer Center only)    Standing Status:   Future    Expected Date:   03/19/2024    Expiration Date:   06/17/2024   Kappa/lambda light chains    Standing Status:   Future    Expected Date:   03/19/2024    Expiration Date:   06/17/2024   Multiple Myeloma Panel (SPEP&IFE w/QIG)    Standing Status:   Future    Expected Date:   03/19/2024    Expiration Date:   06/17/2024   IFE+PROTEIN ELECTRO, 24-HR UR    Standing Status:   Future    Expected Date:   03/19/2024    Expiration Date:   06/17/2024   Follow-up in 3 months. All questions were answered. The patient knows to call the clinic with any problems, questions or concerns.  Call Babara, MD, PhD [facility] Hematology Oncology 11/18/2023     HISTORY OF PRESENTING ILLNESS:  Donna Malone is a  78 y.o.  female with PMH listed below who was referred to me for evaluation of leukocytosis Reviewed patient' recent labs obtained by PCP.   10/22/2019 CBC showed elevated white count of 11.3, predominantly lymphocytosis.  Normal hemoglobin and platelet count. Previous lab records reviewed. Leukocytosis onset of chronic, duration is since January 2021.   No aggravating or elevated factors. Associated symptoms or signs:  Denies weight loss, fever, chills,night sweats.  Endorses fatigue Smoking history: Never smoker History of recent oral steroid use or steroid injection: No recent steroid injections. History of recent infection: Denies Autoimmune disease history.  Denies  Patient denies any chronic wound, prosthesis.  She has chronic knee arthritis.  #  flow cytometry showed CD5+ , CD23 + B-cell population.  CLL/SLL phenotype.  CD38 negative, <1% leukocytes,<5000 cells/ul # 06/29/2021, bone marrow biopsy showed hyper cellular marrow with plasma cell neoplasm, 10 to 15% plasma cell infiltrates, predominant.  Cytogenetics is normal.  Multiple myeloma FISH panel normal.  Standard risk.  Patient takes Lasix   for  lower extremity swelling.  09/07/2022 US  renal ultrasound previously showed Mild bilateral renal atrophy as well as increased echogenicity of renal parenchyma INTERVAL HISTORY Donna Malone is a 78 y.o. female who has above history reviewed by me today presents for follow up smothering multiple myeloma. She feels well today. No new complains.    Review of Systems  Constitutional:  Positive for fatigue. Negative for appetite change, chills and fever.  HENT:   Negative for hearing loss and voice change.   Eyes:  Negative for eye problems.  Respiratory:  Negative for chest tightness and cough.   Cardiovascular:  Negative for chest pain.  Gastrointestinal:  Negative for abdominal distention, abdominal pain and blood in stool.  Endocrine: Negative for hot flashes.  Genitourinary:  Negative for difficulty urinating and frequency.   Musculoskeletal:  Positive for arthralgias.  Skin:  Negative for itching and rash.  Neurological:  Negative for extremity weakness.  Hematological:  Negative for adenopathy.  Psychiatric/Behavioral:  Negative for confusion.      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    SURGICAL HISTORY: Past Surgical History:  Procedure Laterality Date   ABDOMINAL HYSTERECTOMY  04/1970   partial   CHOLECYSTECTOMY  late 1990's   COLONOSCOPY WITH PROPOFOL  N/A 10/09/2021   Procedure: COLONOSCOPY WITH PROPOFOL ;  Surgeon: Therisa Bi, MD;  Location: [facility] ENDOSCOPY;  Service: Gastroenterology;  Laterality: N/A;   COLONOSCOPY WITH PROPOFOL  N/A 11/23/2022   Procedure: COLONOSCOPY WITH PROPOFOL ;  Surgeon: Therisa Bi, MD;  Location: [facility] ENDOSCOPY;  Service: Gastroenterology;  Laterality: N/A;   LAPAROSCOPIC APPENDECTOMY N/A 03/14/2022   Procedure: APPENDECTOMY LAPAROSCOPIC;  Surgeon: Desiderio Schanz, MD;  Location: [facility] ORS;  Service: General;  Laterality: N/A;   POLYPECTOMY  11/23/2022   Procedure: POLYPECTOMY;  Surgeon: Therisa Bi, MD;  Location: [facility] ENDOSCOPY;  Service: Gastroenterology;;    SOCIAL HISTORY: Social History   Socioeconomic History   Marital status: Widowed    Spouse name: Cooper   Number of children: 3   Years of education: Not on file   Highest education level: 12th grade  Occupational History   Occupation: retired  Tobacco Use   Smoking status: Never   Smokeless tobacco: Never  Vaping Use   Vaping status: Never Used  Substance and Sexual Activity   Alcohol use: No   Drug use: No   Sexual activity: Not Currently  Other Topics Concern   Not on file  Social History Narrative   Not on file   Social Drivers of Health   Financial Resource Strain: Low Risk  (04/30/2022)   Overall Financial Resource Strain (CARDIA)    Difficulty of Paying Living Expenses: Not hard at all  Food Insecurity: No Food Insecurity (04/30/2022)   Hunger  Vital Sign    Worried About Programme researcher, broadcasting/film/video in the Last Year: Never true    Ran Out of Food in the Last Year: Never true  Transportation Needs: No Transportation Needs (04/30/2022)   PRAPARE - Administrator, Civil Service (Medical): No    Lack of Transportation (Non-Medical): No  Physical Activity: Inactive (04/30/2022)   Exercise Vital Sign    Days of Exercise per Week: 0 days    Minutes of Exercise per Session: 0 min  Stress: No Stress Concern Present (04/30/2022)   [employer] of Occupational Health - Occupational Stress Questionnaire    Feeling of Stress : Not at all  Social Connections: Moderately Isolated (04/30/2022)   Social Connection and Isolation Panel    Frequency of Communication with Friends and Family:  More than three times a week    Frequency of Social Gatherings with Friends and Family: Twice a week    Attends Religious Services: More than 4 times per year    Active Member of [employer] or Organizations: No    Attends Banker Meetings: Never    Marital Status: Widowed  Intimate Partner Violence: Not At Risk (04/30/2022)   Humiliation, Afraid, Rape, and Kick questionnaire    Fear of Current or Ex-Partner: No    Emotionally Abused: No    Physically Abused: No    Sexually Abused: No    FAMILY HISTORY: Family History  Problem Relation Age of Onset   CAD Mother    Heart attack Mother    Heart disease Father    Diabetes Cousin    Cancer Cousin     ALLERGIES:  is allergic to sulfa antibiotics.  MEDICATIONS:  Current Outpatient Medications  Medication Sig Dispense Refill   Accu-Chek Softclix Lancets lancets Use as instructed 100 each 12   acetaminophen  (TYLENOL ) 500 MG tablet Take 500 mg by mouth every 6 (six) hours as needed for mild pain or headache.     allopurinol  (ZYLOPRIM ) 100 MG tablet Take 1 tablet (100 mg total) by mouth daily. 90 tablet 1   atorvastatin  (LIPITOR) 40 MG tablet TAKE 1 TABLET BY MOUTH EVERY DAY 90 tablet 1   B Complex Vitamins (GNP VITAMIN B COMPLEX PO) Take 1 tablet by mouth daily.     blood glucose meter kit and supplies Dispense based on patient and insurance preference. Use once daily for FBG. (FOR ICD-10 E10.9, E11.9). 1 each 0   Blood Glucose Monitoring Suppl DEVI 1 each by Does not apply route in the morning, at noon, and at bedtime. May substitute to any manufacturer covered by patient's insurance. 1 each 0   cetirizine (ZYRTEC) 10 MG tablet Take 10 mg by mouth daily.     docusate sodium  (COLACE) 100 MG capsule Take 1 capsule (100 mg total) by mouth 2 (two) times daily. HOLD dose if having loose stools. 60 capsule 0   escitalopram  (LEXAPRO ) 20 MG tablet TAKE 1 TABLET BY MOUTH EVERY DAY 90 tablet 0   famotidine  (PEPCID ) 40 MG tablet TAKE 1  TABLET BY MOUTH EVERY DAY 90 tablet 0   glucose blood (ACCU-CHEK GUIDE) test strip Use as instructed 100 each 12   KRILL OIL PO Take by mouth.     levothyroxine  (SYNTHROID ) 75 MCG tablet TAKE 1 TABLET BY MOUTH DAILY BEFORE BREAKFAST. 90 tablet 1   MAGnesium -Oxide 400 (240 Mg) MG tablet Take 1 tablet (400 mg total) by mouth daily. 30 tablet 1   meclizine  (ANTIVERT ) 25 MG  tablet Take 1-2 tablets (25-50 mg total) by mouth 3 (three) times daily as needed for dizziness. 30 tablet 0   Multiple Vitamins-Minerals (HAIR SKIN & NAILS) TABS Take by mouth.     Multiple Vitamins-Minerals (PRESERVISION AREDS 2) CAPS Take 1 capsule by mouth in the morning and at bedtime.     ONETOUCH VERIO test strip USE IN THE MORNING, AT NOON, AND AT BEDTIME. 100 strip 0   Polyethyl Glycol-Propyl Glycol (SYSTANE HYDRATION PF OP) Place 1 drop into both eyes daily as needed (for dry eyes).     Semaglutide , 1 MG/DOSE, 4 MG/3ML SOPN Inject 1 mg as directed once a week. 9 mL 3   traZODone  (DESYREL ) 50 MG tablet TAKE 1 TABLET BY MOUTH EVERYDAY AT BEDTIME 90 tablet 0   Vitamin D , Ergocalciferol , (DRISDOL ) 1.25 MG (50000 UNIT) CAPS capsule TAKE 1 CAPSULE (50,000 UNITS TOTAL) BY MOUTH EVERY 7 (SEVEN) DAYS. SUNDAY 13 capsule 0   No current facility-administered medications for this visit.     PHYSICAL EXAMINATION: ECOG PERFORMANCE STATUS: 1 - Symptomatic but completely ambulatory Vitals:   11/18/23 1308 11/18/23 1315  BP: (!) 143/74 125/77  Pulse: 71   Resp: 18   Temp: (!) 96.7 F (35.9 C)   SpO2: 98%    Filed Weights   11/18/23 1308  Weight: 181 lb 3.2 oz (82.2 kg)    Physical Exam Constitutional:      General: She is not in acute distress.    Comments: Patient walks independently  HENT:     Head: Normocephalic and atraumatic.  Eyes:     General: No scleral icterus. Cardiovascular:     Rate and Rhythm: Normal rate and regular rhythm.     Heart sounds: Normal heart sounds.  Pulmonary:     Effort: Pulmonary  effort is normal. No respiratory distress.     Breath sounds: No wheezing.  Abdominal:     General: Bowel sounds are normal. There is no distension.     Palpations: Abdomen is soft.  Musculoskeletal:        General: No deformity. Normal range of motion.     Cervical back: Normal range of motion and neck supple.  Skin:    General: Skin is warm and dry.     Findings: No erythema or rash.  Neurological:     Mental Status: She is alert and oriented to person, place, and time. Mental status is at baseline.     Cranial Nerves: No cranial nerve deficit.     Coordination: Coordination normal.  Psychiatric:        Mood and Affect: Mood normal.        Latest Ref Rng & Units 11/01/2023    9:07 AM  CMP  Glucose 70 - 99 mg/dL 871   BUN 8 - 23 mg/dL 22   Creatinine 9.55 - 1.00 mg/dL 8.32   Sodium 864 - 854 mmol/L 134   Potassium 3.5 - 5.1 mmol/L 4.0   Chloride 98 - 111 mmol/L 106   CO2 22 - 32 mmol/L 23   Calcium  8.9 - 10.3 mg/dL 9.2   Total Protein 6.5 - 8.1 g/dL 6.6   Total Bilirubin 0.0 - 1.2 mg/dL 0.6   Alkaline Phos 38 - 126 U/L 95   AST 15 - 41 U/L 23   ALT 0 - 44 U/L 19       Latest Ref Rng & Units 11/01/2023    9:06 AM  CBC  WBC 4.0 - 10.5 K/uL  8.0   Hemoglobin 12.0 - 15.0 g/dL 88.5   Hematocrit 63.9 - 46.0 % 33.8   Platelets 150 - 400 K/uL 225      RADIOGRAPHIC STUDIES: I have personally reviewed the radiological images as listed and agreed with the findings in the report. NM PET Image Restage (PS) Whole Body Result Date: 11/03/2023 CLINICAL DATA:  Subsequent treatment strategy for multiple myeloma. EXAM: NUCLEAR MEDICINE PET WHOLE BODY TECHNIQUE: 10.04 mCi F-18 FDG was injected intravenously. Full-ring PET imaging was performed from the head to foot after the radiotracer. CT data was obtained and used for attenuation correction and anatomic localization. Fasting blood glucose: 135 mg/dl COMPARISON:  PET-CT 95/73/7976 and CT examination 07/05/2023 FINDINGS: Mediastinal blood  pool activity: SUV max 2.8 HEAD/NECK: No hypermetabolic activity in the scalp. No hypermetabolic cervical lymph nodes. Incidental CT findings: none CHEST: No hypermetabolic mediastinal or hilar nodes. No suspicious pulmonary nodules on the CT scan. No hypermetabolic breast masses, supraclavicular or axillary adenopathy. Incidental CT findings: Stable aortic and three-vessel coronary artery calcifications. Mild chronic lung changes but no acute pulmonary process. ABDOMEN/PELVIS: No abnormal hypermetabolic activity within the liver, pancreas, adrenal glands, or spleen. No hypermetabolic lymph nodes in the abdomen or pelvis. Incidental CT findings: Stable atherosclerotic calcifications involving the abdominal aorta but no aneurysm. Status post cholecystectomy. No biliary dilatation. Surgical changes from an appendectomy. Status post hysterectomy. Both ovaries are still present and appear normal. Advanced descending and sigmoid colon diverticulosis. SKELETON: No hypermetabolic bone lesions to suggest active myeloma. Incidental CT findings: No lytic myelomatous bone lesions are identified on the CT scan. Remote healed rib fractures are noted EXTREMITIES: No abnormal hypermetabolic activity in the lower extremities. Uptake around both shoulders is fairly typical and likely due to arthropathy. Incidental CT findings: No lytic myelomatous bone lesions are identified. IMPRESSION: 1. No findings for active myeloma. 2. No lytic myelomatous bone lesions are identified on the CT scan. 3. No hypermetabolic soft tissue mass or adenopathy. 4. Stable atherosclerotic calcifications involving the thoracic and abdominal aorta and branch vessels including the coronary arteries. 5. Aortic atherosclerosis. Aortic Atherosclerosis (ICD10-I70.0). Electronically Signed   By: MYRTIS Stammer M.D.   On: 11/03/2023 12:53    LABORATORY DATA:  I have reviewed the data as listed Lab Results  Component Value Date   WBC 8.0 11/01/2023   HGB  11.4 (L) 11/01/2023   HCT 33.8 (L) 11/01/2023   MCV 101.5 (H) 11/01/2023   PLT 225 11/01/2023   Recent Labs    05/27/23 1516 07/05/23 1235 07/18/23 1028 11/01/23 0907  NA 138 139 136 134*  K 5.2 4.2 4.2 4.0  CL 102 110 107 106  CO2 22 16* 19* 23  GLUCOSE 108* 114* 126* 128*  BUN 22 33* 26* 22  CREATININE 1.77* 2.11* 1.93* 1.67*  CALCIUM  9.7 9.5 9.2 9.2  GFRNONAA  --  23* 26* 31*  PROT 6.5  --  6.4* 6.6  ALBUMIN 4.0  --  3.4* 3.7  AST 16  --  20 23  ALT 11  --  15 19  ALKPHOS 140*  --  129* 95  BILITOT 0.3  --  0.7 0.6   Iron/TIBC/Ferritin/ %Sat    Component Value Date/Time   IRON 56 09/13/2023 1356   TIBC 285 09/13/2023 1356   FERRITIN 159 (H) 09/13/2023 1356   IRONPCTSAT 20 09/13/2023 1356

## 2023-11-18 NOTE — Assessment & Plan Note (Addendum)
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  M protein on UPEP has decreased.  Patient has no  hypercalcemia, Hb>10, no bone lesions on x-ray skeletal survey as well as PET scan.  Recommend her to follow up with nephrology. If renal function is progressively worse, can consider empirically try MM treatment to see if that helps to improve renal function,    Lab Results  Component Value Date   MPROTEIN Comment (A) 11/01/2023   KPAFRELGTCHN 188.7 (H) 11/01/2023   LAMBDASER 25.4 11/01/2023   KAPLAMBRATIO 7.43 (H) 11/01/2023    Labs reviewed and discussed with patient, urine M protein is progressing.  stable kidney function, normal calcium , slight decrease of hemoglobin. Patient declines kidney biopsy. No absolute evidence of organ damage.  PET showed no bone metastatic disease.  I recommend continue observation.follow up in 3 months.

## 2023-11-18 NOTE — Assessment & Plan Note (Signed)
Minute clone of B cells. 1 % cells <5000 per ul. Continue observation.  Stable and normal total WBC and lymphocyte count.

## 2023-11-18 NOTE — Assessment & Plan Note (Signed)
Encourage oral hydration avoid nephrotoxins.  Continue follow-up with nephrology.

## 2023-11-21 ENCOUNTER — Other Ambulatory Visit: Payer: Self-pay | Admitting: Family Medicine

## 2023-11-21 DIAGNOSIS — E559 Vitamin D deficiency, unspecified: Secondary | ICD-10-CM

## 2023-12-08 ENCOUNTER — Other Ambulatory Visit: Payer: Self-pay | Admitting: Family Medicine

## 2023-12-08 DIAGNOSIS — F3342 Major depressive disorder, recurrent, in full remission: Secondary | ICD-10-CM

## 2023-12-09 ENCOUNTER — Other Ambulatory Visit: Payer: Self-pay | Admitting: Family Medicine

## 2023-12-09 DIAGNOSIS — F5104 Psychophysiologic insomnia: Secondary | ICD-10-CM

## 2023-12-09 NOTE — Telephone Encounter (Signed)
 LOV 09/13/23 NOV 12/20/23 LRF 08/18/23 LABS 11/01/23

## 2023-12-12 DIAGNOSIS — E113393 Type 2 diabetes mellitus with moderate nonproliferative diabetic retinopathy without macular edema, bilateral: Secondary | ICD-10-CM | POA: Diagnosis not present

## 2023-12-13 ENCOUNTER — Encounter: Payer: Self-pay | Admitting: Family Medicine

## 2023-12-18 ENCOUNTER — Other Ambulatory Visit: Payer: Self-pay

## 2023-12-20 ENCOUNTER — Other Ambulatory Visit: Payer: Self-pay

## 2023-12-20 ENCOUNTER — Encounter: Payer: Self-pay | Admitting: Family Medicine

## 2023-12-20 ENCOUNTER — Ambulatory Visit (INDEPENDENT_AMBULATORY_CARE_PROVIDER_SITE_OTHER): Admitting: Family Medicine

## 2023-12-20 VITALS — BP 117/68 | HR 78 | Temp 98.2°F | Ht 63.0 in | Wt 181.1 lb

## 2023-12-20 DIAGNOSIS — Z0000 Encounter for general adult medical examination without abnormal findings: Secondary | ICD-10-CM

## 2023-12-20 DIAGNOSIS — E1122 Type 2 diabetes mellitus with diabetic chronic kidney disease: Secondary | ICD-10-CM | POA: Diagnosis not present

## 2023-12-20 DIAGNOSIS — N184 Chronic kidney disease, stage 4 (severe): Secondary | ICD-10-CM | POA: Diagnosis not present

## 2023-12-20 DIAGNOSIS — E1169 Type 2 diabetes mellitus with other specified complication: Secondary | ICD-10-CM

## 2023-12-20 DIAGNOSIS — Z0001 Encounter for general adult medical examination with abnormal findings: Secondary | ICD-10-CM

## 2023-12-20 DIAGNOSIS — E785 Hyperlipidemia, unspecified: Secondary | ICD-10-CM

## 2023-12-20 NOTE — Progress Notes (Signed)
 Subjective:   Donna Malone is a 78 y.o. female who presents for Medicare Annual (Subsequent) preventive examination.  Visit Complete: In person  Patient Medicare AWV questionnaire was completed by the patient on 12/20/2023; I have confirmed that all information answered by patient is correct and no changes since this date.        Objective:    Today's Vitals   12/20/23 1407  BP: 117/68  Pulse: 78  Temp: 98.2 F (36.8 C)  TempSrc: Oral  SpO2: 98%  Weight: 181 lb 1.6 oz (82.1 kg)  Height: 5' 3 (1.6 m)   Body mass index is 32.08 kg/m.     08/01/2023   10:17 AM 07/05/2023   12:35 PM 05/27/2023    2:25 PM 04/12/2023   10:42 AM 12/21/2022    9:58 AM 11/23/2022    8:06 AM 09/03/2022   10:10 AM  Advanced Directives  Does Patient Have a Medical Advance Directive? No No Yes No No Yes No  Type of Dispensing optician of [place];Living will   Does patient want to make changes to medical advance directive?   No - Patient declined      Would patient like information on creating a medical advance directive? No - Patient declined No - Patient declined  No - Patient declined       Current Medications (verified) Outpatient Encounter Medications as of 12/20/2023  Medication Sig   Accu-Chek Softclix Lancets lancets Use as instructed   acetaminophen  (TYLENOL ) 500 MG tablet Take 500 mg by mouth every 6 (six) hours as needed for mild pain or headache.   allopurinol  (ZYLOPRIM ) 100 MG tablet Take 1 tablet (100 mg total) by mouth daily.   atorvastatin  (LIPITOR) 40 MG tablet TAKE 1 TABLET BY MOUTH EVERY DAY   B Complex Vitamins (GNP VITAMIN B COMPLEX PO) Take 1 tablet by mouth daily.   Blood Glucose Monitoring Suppl DEVI 1 each by Does not apply route in the morning, at noon, and at bedtime. May substitute to any manufacturer covered by patient's insurance.   cetirizine (ZYRTEC) 10 MG tablet Take 10 mg by mouth daily.   docusate sodium  (COLACE) 100 MG capsule Take 1  capsule (100 mg total) by mouth 2 (two) times daily. HOLD dose if having loose stools.   escitalopram  (LEXAPRO ) 20 MG tablet TAKE 1 TABLET BY MOUTH EVERY DAY   famotidine  (PEPCID ) 40 MG tablet TAKE 1 TABLET BY MOUTH EVERY DAY   KRILL OIL PO Take by mouth.   levothyroxine  (SYNTHROID ) 75 MCG tablet TAKE 1 TABLET BY MOUTH DAILY BEFORE BREAKFAST.   MAGnesium -Oxide 400 (240 Mg) MG tablet Take 1 tablet (400 mg total) by mouth daily.   meclizine  (ANTIVERT ) 25 MG tablet Take 1-2 tablets (25-50 mg total) by mouth 3 (three) times daily as needed for dizziness.   Multiple Vitamins-Minerals (HAIR SKIN & NAILS) TABS Take by mouth.   Multiple Vitamins-Minerals (PRESERVISION AREDS 2) CAPS Take 1 capsule by mouth in the morning and at bedtime.   ONETOUCH VERIO test strip USE IN THE MORNING, AT NOON, AND AT BEDTIME.   Polyethyl Glycol-Propyl Glycol (SYSTANE HYDRATION PF OP) Place 1 drop into both eyes daily as needed (for dry eyes).   Semaglutide , 1 MG/DOSE, 4 MG/3ML SOPN Inject 1 mg as directed once a week.   traZODone  (DESYREL ) 50 MG tablet TAKE 1 TABLET BY MOUTH EVERYDAY AT BEDTIME   Vitamin D , Ergocalciferol , (DRISDOL ) 1.25 MG (50000 UNIT) CAPS capsule  TAKE 1 CAPSULE (50,000 UNITS TOTAL) BY MOUTH EVERY 7 (SEVEN) DAYS. SUNDAY   [DISCONTINUED] blood glucose meter kit and supplies Dispense based on patient and insurance preference. Use once daily for FBG. (FOR ICD-10 E10.9, E11.9).   [DISCONTINUED] glucose blood (ACCU-CHEK GUIDE) test strip Use as instructed   No facility-administered encounter medications on file as of 12/20/2023.    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   Thyroid  disease    Past Surgical History:  Procedure Laterality Date   ABDOMINAL HYSTERECTOMY  04/02/1970   partial   CHOLECYSTECTOMY  late 1990's   COLONOSCOPY WITH PROPOFOL  N/A 10/09/2021   Procedure: COLONOSCOPY WITH PROPOFOL ;  Surgeon: Therisa Bi, MD;  Location: [facility] ENDOSCOPY;  Service: Gastroenterology;  Laterality: N/A;   COLONOSCOPY WITH PROPOFOL  N/A 11/23/2022   Procedure: COLONOSCOPY WITH PROPOFOL ;  Surgeon: Therisa Bi, MD;  Location: [facility] ENDOSCOPY;  Service: Gastroenterology;  Laterality: N/A;   EYE SURGERY  06/10/19      07/15/19   LAPAROSCOPIC APPENDECTOMY N/A 03/14/2022   Procedure: APPENDECTOMY LAPAROSCOPIC;  Surgeon: Desiderio Schanz, MD;  Location: [facility] ORS;  Service: General;  Laterality: N/A;   POLYPECTOMY  11/23/2022   Procedure: POLYPECTOMY;  Surgeon: Therisa Bi, MD;  Location: [facility] ENDOSCOPY;  Service: Gastroenterology;;   Family History  Problem Relation Age of Onset   CAD Mother    Heart attack Mother    Arthritis Mother    Heart disease Mother    Heart disease Father    Diabetes Cousin    Cancer Cousin    Diabetes Maternal Grandmother    Drug abuse Brother    Social History   Socioeconomic History   Marital status: Widowed    Spouse name: Cooper   Number of children: 3   Years of education: Not on file   Highest education level: 12th grade  Occupational History   Occupation: retired  Tobacco Use   Smoking status: Never   Smokeless tobacco: Never  Vaping Use   Vaping status: Never Used  Substance and Sexual Activity   Alcohol use: No   Drug use: No   Sexual activity: Not Currently  Other Topics Concern   Not on file  Social History Narrative   Not on file   Social Drivers of Health   Financial Resource Strain: Low Risk  (04/30/2022)   Overall Financial Resource Strain (CARDIA)     Difficulty of Paying Living Expenses: Not hard at all  Food Insecurity: No Food Insecurity (04/30/2022)   Hunger Vital Sign    Worried About Running Out of Food in the Last Year: Never true    Ran Out of Food in the Last Year: Never true  Transportation Needs: No Transportation Needs (04/30/2022)   PRAPARE - Administrator, Civil Service (Medical): No    Lack of Transportation (Non-Medical): No  Physical Activity: Inactive (04/30/2022)   Exercise Vital Sign    Days of Exercise per Week: 0 days    Minutes of  Exercise per Session: 0 min  Stress: No Stress Concern Present (04/30/2022)   [employer] of Occupational Health - Occupational Stress Questionnaire    Feeling of Stress : Not at all  Social Connections: Moderately Isolated (04/30/2022)   Social Connection and Isolation Panel    Frequency of Communication with Friends and Family: More than three times a week    Frequency of Social Gatherings with Friends and Family: Twice a week    Attends Religious Services: More than 4 times per year    Active Member of [employer] or Organizations: No    Attends Banker Meetings: Never    Marital Status: Widowed    Tobacco Counseling Counseling given: Not Answered   Clinical Intake:   Activities of Daily Living    12/20/2023    2:00 PM 05/27/2023    2:21 PM  In your present state of health, do you have any difficulty performing the following activities:  Hearing? 0 0  Vision? 0 0  Difficulty concentrating or making decisions? 0 0  Walking or climbing stairs? 0 0  Comment Out of breath when finished climbing.   Dressing or bathing? 0 0  Doing errands, shopping? 0 1  Preparing Food and eating ? N N  Using the Toilet? N N  In the past six months, have you accidently leaked urine? N N  Do you have problems with loss of bowel control? N Y  Managing your Medications? N N  Managing your Finances? N N  Housekeeping or managing your Housekeeping? N Y    Patient  Care Team: Donzella Lauraine SAILOR, DO as PCP - General (Family Medicine) Carolee Manus DASEN., MD as Consulting Physician (Ophthalmology) Leora Lynwood SAUNDERS, MD as Consulting Physician (Orthopedic Surgery) Dennise Capri, MD (Nephrology) Babara Call, MD as Consulting Physician (Oncology)  Indicate any recent Medical Services you may have received from other than [facility] providers in the past year (date may be approximate).     Assessment:   This is a routine wellness examination for [place].  Hearing/Vision screen No results found.   Goals Addressed   None    Depression Screen    12/20/2023    2:04 PM 09/13/2023    1:08 PM 05/27/2023    2:30 PM 01/08/2023    4:16 PM 11/01/2022    9:23 AM 08/28/2022    3:05 PM 05/01/2022    1:32 PM  PHQ 2/9 Scores  PHQ - 2 Score 0 2 5 1 4 2 3   PHQ- 9 Score 3 6 11 7 10 11 12     Fall Risk    12/20/2023    2:03 PM 05/27/2023    2:25 PM 11/01/2022    9:22 AM 08/28/2022    3:05 PM 05/01/2022    1:32 PM  Fall Risk   Falls in the past year? 1 0 0 1 0  Number falls in past yr: 0 0 0 0 0  Injury with Fall? 1 0 0 0 0  Comment Fell down the stairs, glued cut over left eye and bruises, brain bleed      Risk for fall due to :    No Fall Risks   Follow up    Falls evaluation completed     MEDICARE RISK AT HOME:    TIMED UP AND GO:  Was the test performed?  No    Cognitive Function:        12/20/2023    2:05 PM 05/27/2023    2:27 PM  04/30/2022    8:29 AM 04/14/2018   11:08 AM 03/20/2016    9:04 AM  6CIT Screen  What Year? 0 points 0 points 0 points 0 points 0 points  What month? 0 points 0 points 0 points 0 points 0 points  What time? 0 points 0 points 0 points 0 points 0 points  Count back from 20 0 points 0 points 0 points 0 points 0 points  Months in reverse 0 points 0 points 0 points 0 points 0 points  Repeat phrase 0 points 0 points 2 points 0 points 2 points  Total Score 0 points 0 points 2 points 0 points 2 points    Immunizations Immunization History   Administered Date(s) Administered   Fluad Quad(high Dose 65+) 04/20/2019, 05/04/2021   Fluad Trivalent(High Dose 65+) 01/28/2023   INFLUENZA, HIGH DOSE SEASONAL PF 02/09/2015, 03/20/2016, 12/24/2016, 02/07/2018, 01/28/2023   Influenza Split 12/16/2009, 03/21/2011, 01/30/2012   Influenza,inj,Quad PF,6+ Mos 02/06/2013, 12/03/2013   PFIZER(Purple Top)SARS-COV-2 Vaccination 06/19/2019, 07/14/2019, 03/18/2020   Pneumococcal Conjugate-13 12/03/2013   Pneumococcal Polysaccharide-23 03/21/2011   Tdap 04/09/2008, 05/04/2021   Zoster Recombinant(Shingrix) 05/04/2021, 09/23/2023    TDAP status: Up to date  Flu Vaccine status: Due, Education has been provided regarding the importance of this vaccine. Advised may receive this vaccine at local pharmacy or Health Dept. Aware to provide a copy of the vaccination record if obtained from local pharmacy or Health Dept. Verbalized acceptance and understanding.  Pneumococcal vaccine status: Up to date  Covid-19 vaccine status: Declined, Education has been provided regarding the importance of this vaccine but patient still declined. Advised may receive this vaccine at local pharmacy or Health Dept.or vaccine clinic. Aware to provide a copy of the vaccination record if obtained from local pharmacy or Health Dept. Verbalized acceptance and understanding.  Qualifies for Shingles Vaccine? Yes   Zostavax completed No   Shingrix Completed?: Yes  Screening Tests Health Maintenance  Topic Date Due   Influenza Vaccine  11/01/2023   Mammogram  05/26/2024 (Originally 04/23/2019)   HEMOGLOBIN A1C  03/14/2024   Diabetic kidney evaluation - Urine ACR  05/26/2024   FOOT EXAM  05/26/2024   OPHTHALMOLOGY EXAM  06/09/2024   Diabetic kidney evaluation - eGFR measurement  10/31/2024   Medicare Annual Wellness (AWV)  12/19/2024   DTaP/Tdap/Td (3 - Td or Tdap) 05/05/2031   Pneumococcal Vaccine: 50+ Years  Completed   DEXA SCAN  Completed   Zoster Vaccines- Shingrix   Completed   HPV VACCINES  Aged Out   Meningococcal B Vaccine  Aged Out   COVID-19 Vaccine  Discontinued   Hepatitis C Screening  Discontinued   Fecal DNA (Cologuard)  Discontinued    Health Maintenance  Health Maintenance Due  Topic Date Due   Influenza Vaccine  11/01/2023    Colorectal cancer screening: No longer required.   Mammogram status: No longer required due to patient preference.  Bone Density status: Completed in 2019. Results reflect: Bone density results: NORMAL. Patient prefers not to repeat.  Lung Cancer Screening: (Low Dose CT Chest recommended if Age 30-80 years, 20 pack-year currently smoking OR have quit w/in 15years.) does not qualify.   Lung Cancer Screening Referral: N/A  Additional Screening:  Hepatitis C Screening: does not qualify; Completed N/A  Vision Screening: Recommended annual ophthalmology exams for early detection of glaucoma and other disorders of the eye. Is the patient up to date with their annual eye exam?  Yes  Who is the provider or what is  the name of the office in which the patient attends annual eye exams? [facility] If pt is not established with a provider, would they like to be referred to a provider to establish care? N/A.   Dental Screening: Recommended annual dental exams for proper oral hygiene  Diabetic Foot Exam: Diabetic Foot Exam: Completed 05/29/2023  Community Resource Referral / Chronic Care Management: CRR required this visit?  No   CCM required this visit?  No     Plan:     I have personally reviewed and noted the following in the patient's chart:   Medical and social history Use of alcohol, tobacco or illicit drugs  Current medications and supplements including opioid prescriptions. Patient is not currently taking opioid prescriptions. Functional ability and status Nutritional status Physical activity Advanced directives List of other physicians Hospitalizations, surgeries, and ER visits in previous  12 months Vitals Screenings to include cognitive, depression, and falls Referrals and appointments  In addition, I have reviewed and discussed with patient certain preventive protocols, quality metrics, and best practice recommendations. A written personalized care plan for preventive services as well as general preventive health recommendations were provided to patient.     Jaqwan Wieber N Jaylen Claude, DO   12/20/2023   After Visit Summary: (In Person-Printed) AVS printed and given to the patient

## 2023-12-21 LAB — LIPID PANEL
Chol/HDL Ratio: 4.4 ratio (ref 0.0–4.4)
Cholesterol, Total: 177 mg/dL (ref 100–199)
HDL: 40 mg/dL (ref >39)
LDL Chol Calc (NIH): 96 mg/dL (ref 0–99)
Triglycerides: 243 mg/dL — ABNORMAL HIGH (ref 0–149)
VLDL Cholesterol Cal: 41 mg/dL — ABNORMAL HIGH (ref 5–40)

## 2023-12-21 LAB — HEMOGLOBIN A1C
Est. average glucose Bld gHb Est-mCnc: 120 mg/dL
Hgb A1c MFr Bld: 5.8 % — ABNORMAL HIGH (ref 4.8–5.6)

## 2023-12-23 DIAGNOSIS — M5136 Other intervertebral disc degeneration, lumbar region with discogenic back pain only: Secondary | ICD-10-CM | POA: Diagnosis not present

## 2023-12-23 DIAGNOSIS — M9905 Segmental and somatic dysfunction of pelvic region: Secondary | ICD-10-CM | POA: Diagnosis not present

## 2023-12-23 DIAGNOSIS — M9903 Segmental and somatic dysfunction of lumbar region: Secondary | ICD-10-CM | POA: Diagnosis not present

## 2023-12-23 DIAGNOSIS — M5416 Radiculopathy, lumbar region: Secondary | ICD-10-CM | POA: Diagnosis not present

## 2024-01-01 ENCOUNTER — Telehealth: Payer: Self-pay

## 2024-01-01 NOTE — Telephone Encounter (Signed)
 LMTCB-ok for E2C2 to give message to patient. We can either print them or mail them We are not able to email them.

## 2024-01-01 NOTE — Telephone Encounter (Unsigned)
 Copied from CRM [id number]. Topic: General - Other >> Dec 31, 2023 10:34 AM Cleave MATSU wrote: Reason for CRM: pt want her lab results sent to [email or web address] please send these over.

## 2024-01-02 NOTE — Telephone Encounter (Signed)
 Labs printed and placed up front.

## 2024-01-03 ENCOUNTER — Ambulatory Visit: Payer: Self-pay | Admitting: Family Medicine

## 2024-01-05 ENCOUNTER — Other Ambulatory Visit: Payer: Self-pay | Admitting: Family Medicine

## 2024-01-05 DIAGNOSIS — E038 Other specified hypothyroidism: Secondary | ICD-10-CM

## 2024-01-16 ENCOUNTER — Other Ambulatory Visit: Payer: Self-pay

## 2024-01-20 DIAGNOSIS — M9905 Segmental and somatic dysfunction of pelvic region: Secondary | ICD-10-CM | POA: Diagnosis not present

## 2024-01-20 DIAGNOSIS — M5416 Radiculopathy, lumbar region: Secondary | ICD-10-CM | POA: Diagnosis not present

## 2024-01-20 DIAGNOSIS — M5136 Other intervertebral disc degeneration, lumbar region with discogenic back pain only: Secondary | ICD-10-CM | POA: Diagnosis not present

## 2024-01-20 DIAGNOSIS — M9903 Segmental and somatic dysfunction of lumbar region: Secondary | ICD-10-CM | POA: Diagnosis not present

## 2024-01-29 DIAGNOSIS — N2581 Secondary hyperparathyroidism of renal origin: Secondary | ICD-10-CM | POA: Diagnosis not present

## 2024-01-29 DIAGNOSIS — E1122 Type 2 diabetes mellitus with diabetic chronic kidney disease: Secondary | ICD-10-CM | POA: Diagnosis not present

## 2024-01-29 DIAGNOSIS — N1832 Chronic kidney disease, stage 3b: Secondary | ICD-10-CM | POA: Diagnosis not present

## 2024-01-29 DIAGNOSIS — D472 Monoclonal gammopathy: Secondary | ICD-10-CM | POA: Diagnosis not present

## 2024-02-05 DIAGNOSIS — E1122 Type 2 diabetes mellitus with diabetic chronic kidney disease: Secondary | ICD-10-CM | POA: Diagnosis not present

## 2024-02-05 DIAGNOSIS — D472 Monoclonal gammopathy: Secondary | ICD-10-CM | POA: Diagnosis not present

## 2024-02-05 DIAGNOSIS — N1832 Chronic kidney disease, stage 3b: Secondary | ICD-10-CM | POA: Diagnosis not present

## 2024-02-05 DIAGNOSIS — N189 Chronic kidney disease, unspecified: Secondary | ICD-10-CM | POA: Diagnosis not present

## 2024-02-05 DIAGNOSIS — N2581 Secondary hyperparathyroidism of renal origin: Secondary | ICD-10-CM | POA: Diagnosis not present

## 2024-02-05 DIAGNOSIS — D631 Anemia in chronic kidney disease: Secondary | ICD-10-CM | POA: Diagnosis not present

## 2024-02-07 IMAGING — PT NM PET TUM IMG INITIAL (PI) WHOLE BODY
7 series · 25 of 25 positions shown · non-contrast
Comparison: None available.

CLINICAL DATA: Initial treatment strategy for multiple myeloma.

EXAM:
NUCLEAR MEDICINE PET WHOLE BODY
TECHNIQUE: 8.8 mCi F-18 FDG was injected intravenously. Full-ring PET imaging
was performed from the head to foot after the radiotracer. CT data
was obtained and used for attenuation correction and anatomic
localization.
Fasting blood glucose: 121 mg/dl

[Series 4: pet wb ac · axial · 5.0mm · 4.07mm/px · z∈[-464,+1246]mm · 5 of 343 slices shown]
[im 1/343]
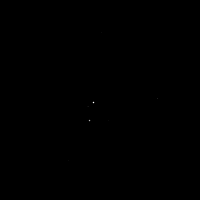
[im 86/343]
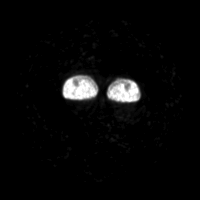
[im 172/343]
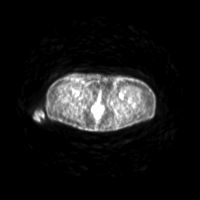
[im 257/343]
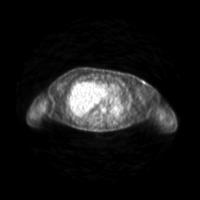
[im 343/343]
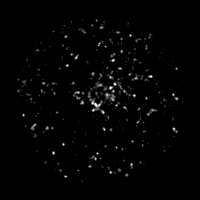

[Series 5: ct wb 5.0 hd_fov · axial · 5.0mm · 1.22mm/px · z∈[-459,+1246]mm · 5 of 340 slices shown]
[im 1/340]
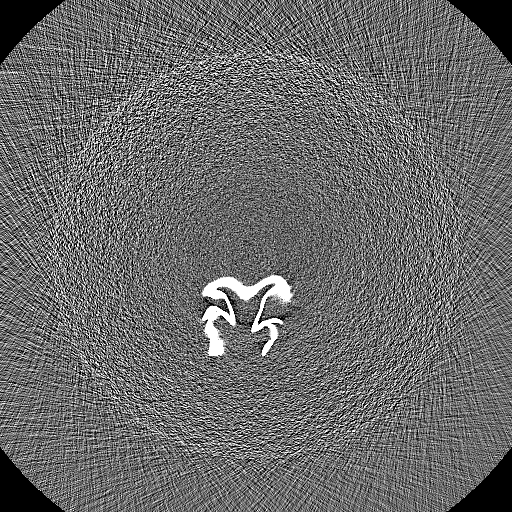
[im 85/340  soft-tissue]
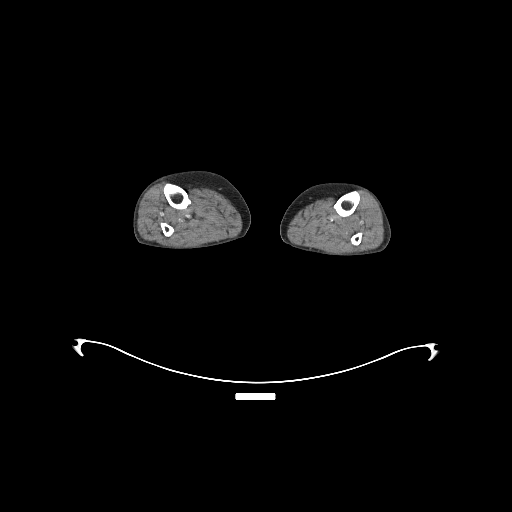
[im 170/340  soft-tissue]
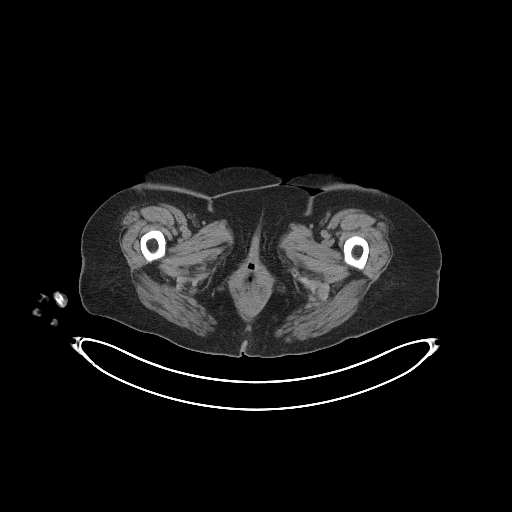
[im 255/340  soft-tissue]
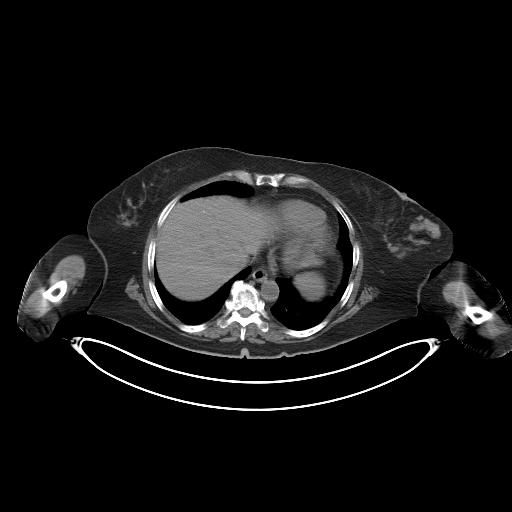
[im 340/340  soft-tissue]
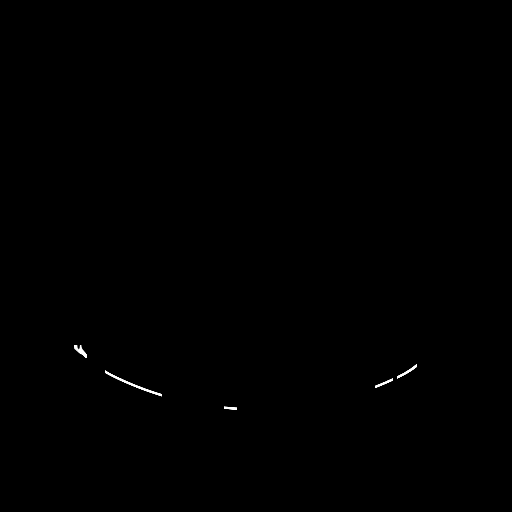

[Series 6: pet wb nac · axial · 5.0mm · 4.07mm/px · z∈[-464,+1246]mm · 6 of 343 slices shown]
[im 1/343  full-range]
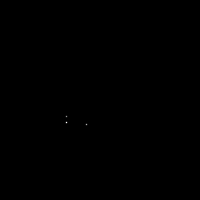
[im 69/343]
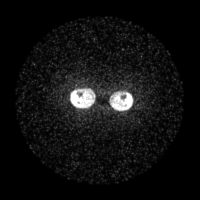
[im 137/343]
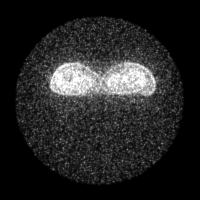
[im 206/343]
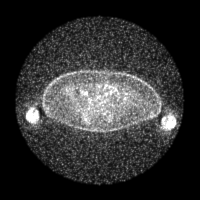
[im 274/343]
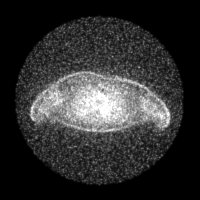
[im 343/343]
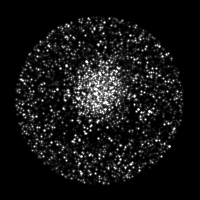

[Series 8: ct 5.0 bl57 lung_bone · axial · 5.0mm · 0.68mm/px · 1 of 49 slices shown]
[im 1/49  lung]
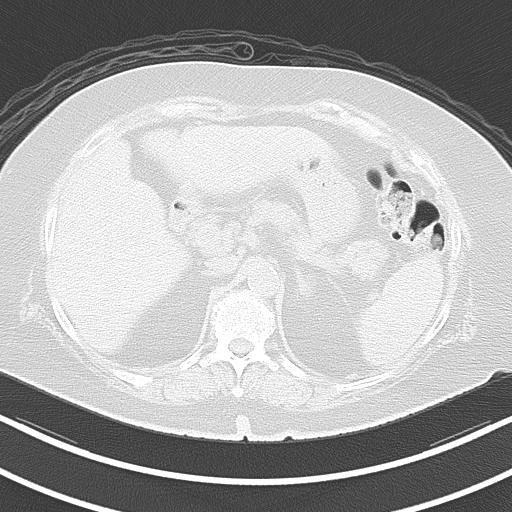

[Series 603: fused tra · 6 of 323 slices shown]
[im 1/323]
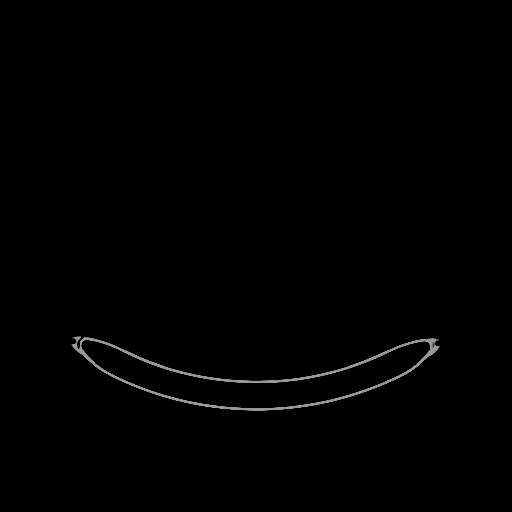
[im 65/323]
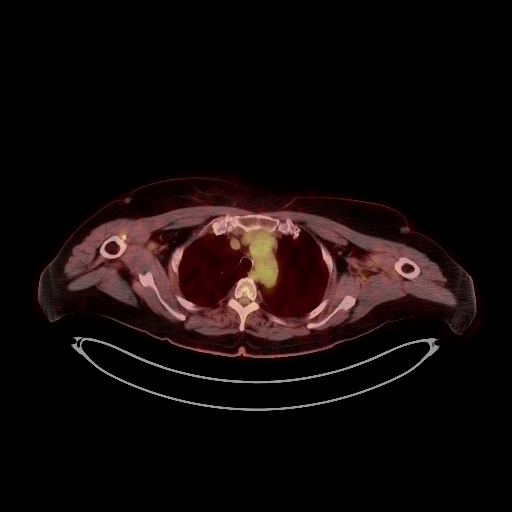
[im 129/323]
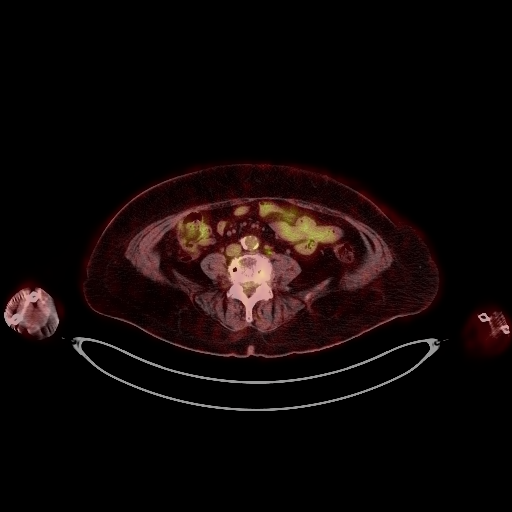
[im 194/323]
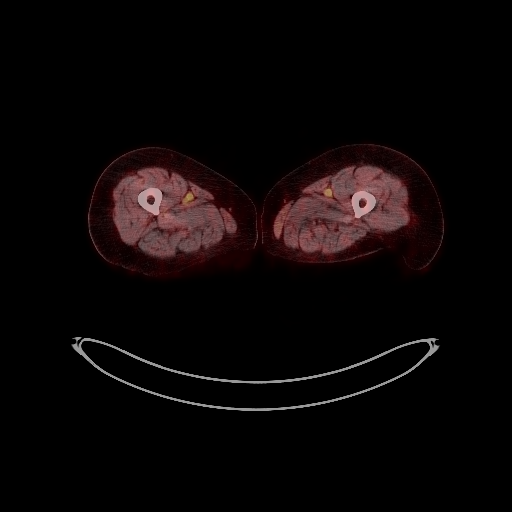
[im 258/323]
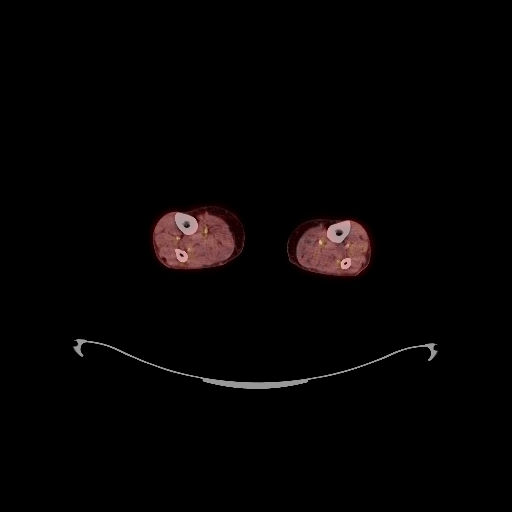
[im 323/323]
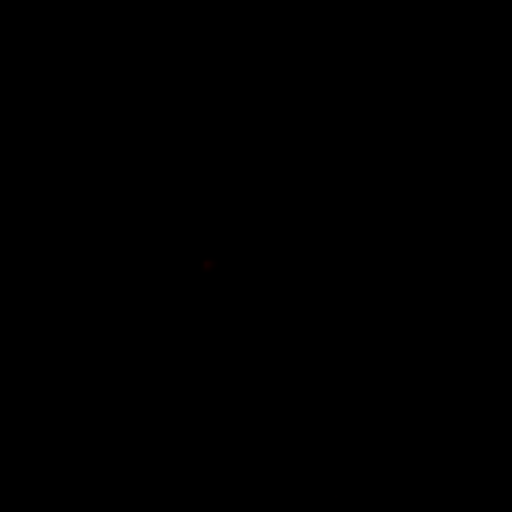

[Series 604: fused cor · 1 of 31 slices shown]
[im 1/31]
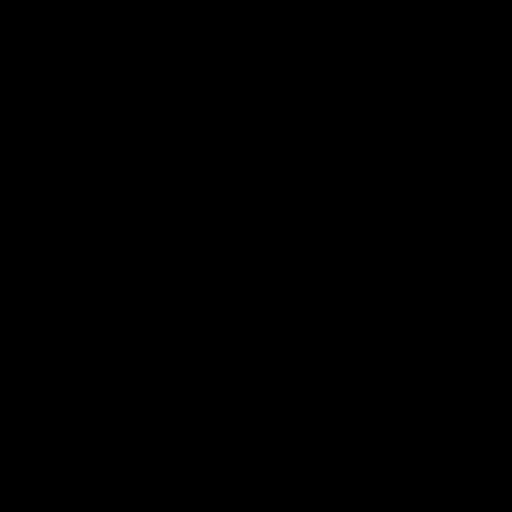

[Series 605: mip pet · coronal · 3.54mm/px · 1 of 32 slices shown]
[im 1/32]
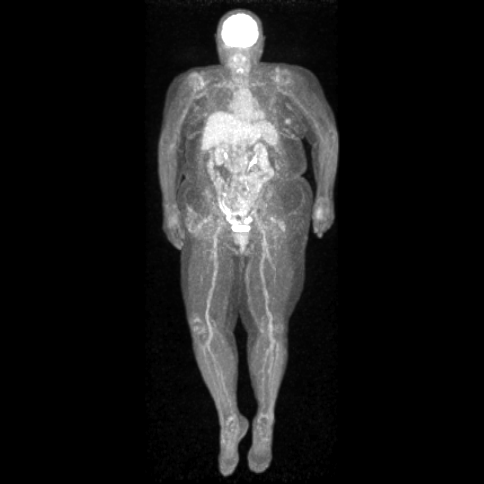

[25 of 25 positions shown; findings below may reference images not displayed]

FINDINGS: Mediastinal blood pool activity: SUV max

HEAD/NECK: No hypermetabolic activity in the scalp. No
hypermetabolic cervical lymph nodes.

Incidental CT findings: none

CHEST: No hypermetabolic mediastinal or hilar nodes. No suspicious
pulmonary nodules on the CT scan.

Incidental CT findings: none

ABDOMEN/PELVIS: No abnormal hypermetabolic activity within the
liver, pancreas, adrenal glands, or spleen. No hypermetabolic lymph
nodes in the abdomen or pelvis.

Incidental CT findings: Fluid collection posterior to the stomach
consistent with gastric diverticulum measuring 3.9 cm (image 95/).
Post cholecystectomy. LEFT colon diverticulosis.

SKELETON: No focal hypermetabolic activity to suggest skeletal
metastasis.

Incidental CT findings: none

EXTREMITIES: No abnormal hypermetabolic activity in the lower
extremities.

Incidental CT findings: none
IMPRESSION: 1. No evidence of active multiple myeloma within the skeleton.
2. No evidence of lytic or sclerotic lesion on CT portion exam.
3. No evidence soft tissue plasmacytoma.

## 2024-02-14 ENCOUNTER — Other Ambulatory Visit: Payer: Self-pay

## 2024-02-17 DIAGNOSIS — M5136 Other intervertebral disc degeneration, lumbar region with discogenic back pain only: Secondary | ICD-10-CM | POA: Diagnosis not present

## 2024-02-17 DIAGNOSIS — M9903 Segmental and somatic dysfunction of lumbar region: Secondary | ICD-10-CM | POA: Diagnosis not present

## 2024-02-17 DIAGNOSIS — M5416 Radiculopathy, lumbar region: Secondary | ICD-10-CM | POA: Diagnosis not present

## 2024-02-17 DIAGNOSIS — M9905 Segmental and somatic dysfunction of pelvic region: Secondary | ICD-10-CM | POA: Diagnosis not present

## 2024-02-23 ENCOUNTER — Other Ambulatory Visit: Payer: Self-pay | Admitting: Family Medicine

## 2024-02-23 DIAGNOSIS — M109 Gout, unspecified: Secondary | ICD-10-CM

## 2024-02-23 DIAGNOSIS — E7800 Pure hypercholesterolemia, unspecified: Secondary | ICD-10-CM

## 2024-03-06 ENCOUNTER — Inpatient Hospital Stay: Attending: Oncology

## 2024-03-06 DIAGNOSIS — D472 Monoclonal gammopathy: Secondary | ICD-10-CM | POA: Diagnosis present

## 2024-03-06 DIAGNOSIS — N184 Chronic kidney disease, stage 4 (severe): Secondary | ICD-10-CM | POA: Diagnosis not present

## 2024-03-06 DIAGNOSIS — D72820 Lymphocytosis (symptomatic): Secondary | ICD-10-CM | POA: Diagnosis not present

## 2024-03-06 LAB — CBC WITH DIFFERENTIAL (CANCER CENTER ONLY)
Abs Immature Granulocytes: 0.02 K/uL (ref 0.00–0.07)
Basophils Absolute: 0.1 K/uL (ref 0.0–0.1)
Basophils Relative: 1 %
Eosinophils Absolute: 0.3 K/uL (ref 0.0–0.5)
Eosinophils Relative: 3 %
HCT: 33.1 % — ABNORMAL LOW (ref 36.0–46.0)
Hemoglobin: 11.2 g/dL — ABNORMAL LOW (ref 12.0–15.0)
Immature Granulocytes: 0 %
Lymphocytes Relative: 34 %
Lymphs Abs: 2.7 K/uL (ref 0.7–4.0)
MCH: 34.8 pg — ABNORMAL HIGH (ref 26.0–34.0)
MCHC: 33.8 g/dL (ref 30.0–36.0)
MCV: 102.8 fL — ABNORMAL HIGH (ref 80.0–100.0)
Monocytes Absolute: 0.5 K/uL (ref 0.1–1.0)
Monocytes Relative: 6 %
Neutro Abs: 4.5 K/uL (ref 1.7–7.7)
Neutrophils Relative %: 56 %
Platelet Count: 232 K/uL (ref 150–400)
RBC: 3.22 MIL/uL — ABNORMAL LOW (ref 3.87–5.11)
RDW: 12.9 % (ref 11.5–15.5)
WBC Count: 8 K/uL (ref 4.0–10.5)
nRBC: 0 % (ref 0.0–0.2)

## 2024-03-06 LAB — CMP (CANCER CENTER ONLY)
ALT: 15 U/L (ref 0–44)
AST: 22 U/L (ref 15–41)
Albumin: 3.8 g/dL (ref 3.5–5.0)
Alkaline Phosphatase: 120 U/L (ref 38–126)
Anion gap: 13 (ref 5–15)
BUN: 29 mg/dL — ABNORMAL HIGH (ref 8–23)
CO2: 21 mmol/L — ABNORMAL LOW (ref 22–32)
Calcium: 9.4 mg/dL (ref 8.9–10.3)
Chloride: 105 mmol/L (ref 98–111)
Creatinine: 1.87 mg/dL — ABNORMAL HIGH (ref 0.44–1.00)
GFR, Estimated: 27 mL/min — ABNORMAL LOW (ref >60)
Glucose, Bld: 112 mg/dL — ABNORMAL HIGH (ref 70–99)
Potassium: 4.1 mmol/L (ref 3.5–5.1)
Sodium: 139 mmol/L (ref 135–145)
Total Bilirubin: 0.2 mg/dL (ref 0.0–1.2)
Total Protein: 6.8 g/dL (ref 6.5–8.1)

## 2024-03-09 LAB — MULTIPLE MYELOMA PANEL, SERUM
Albumin SerPl Elph-Mcnc: 3.4 g/dL (ref 2.9–4.4)
Albumin/Glob SerPl: 1.2 (ref 0.7–1.7)
Alpha 1: 0.3 g/dL (ref 0.0–0.4)
Alpha2 Glob SerPl Elph-Mcnc: 1.2 g/dL — ABNORMAL HIGH (ref 0.4–1.0)
B-Globulin SerPl Elph-Mcnc: 1.2 g/dL (ref 0.7–1.3)
Gamma Glob SerPl Elph-Mcnc: 0.4 g/dL (ref 0.4–1.8)
Globulin, Total: 3 g/dL (ref 2.2–3.9)
IgA: 675 mg/dL — ABNORMAL HIGH (ref 64–422)
IgG (Immunoglobin G), Serum: 487 mg/dL — ABNORMAL LOW (ref 586–1602)
IgM (Immunoglobulin M), Srm: 106 mg/dL (ref 26–217)
Total Protein ELP: 6.4 g/dL (ref 6.0–8.5)

## 2024-03-09 LAB — KAPPA/LAMBDA LIGHT CHAINS
Kappa free light chain: 225 mg/L — ABNORMAL HIGH (ref 3.3–19.4)
Kappa, lambda light chain ratio: 8.36 — ABNORMAL HIGH (ref 0.26–1.65)
Lambda free light chains: 26.9 mg/L — ABNORMAL HIGH (ref 5.7–26.3)

## 2024-03-11 LAB — IFE+PROTEIN ELECTRO, 24-HR UR
% BETA, Urine: 54.9 %
ALPHA 1 URINE: 2.3 %
Albumin, U: 32.8 %
Alpha 2, Urine: 4.5 %
GAMMA GLOBULIN URINE: 5.4 %
M-SPIKE %, Urine: 37.4 % — ABNORMAL HIGH
M-Spike, Mg/24 Hr: 185 mg/(24.h) — ABNORMAL HIGH
Total Protein, Urine-Ur/day: 493 mg/(24.h) — ABNORMAL HIGH (ref 30–150)
Total Protein, Urine: 29.9 mg/dL
Total Volume: 1650

## 2024-03-12 ENCOUNTER — Other Ambulatory Visit: Payer: Self-pay

## 2024-03-16 ENCOUNTER — Inpatient Hospital Stay: Admitting: Oncology

## 2024-03-16 ENCOUNTER — Encounter: Payer: Self-pay | Admitting: Oncology

## 2024-03-16 VITALS — BP 131/72 | HR 70 | Temp 98.6°F | Resp 17 | Wt 184.0 lb

## 2024-03-16 DIAGNOSIS — N184 Chronic kidney disease, stage 4 (severe): Secondary | ICD-10-CM

## 2024-03-16 DIAGNOSIS — D472 Monoclonal gammopathy: Secondary | ICD-10-CM

## 2024-03-16 DIAGNOSIS — D72820 Lymphocytosis (symptomatic): Secondary | ICD-10-CM

## 2024-03-16 NOTE — Assessment & Plan Note (Addendum)
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  M protein on UPEP has decreased.  Patient has no  hypercalcemia, Hb>10, no bone lesions on x-ray skeletal survey as well as PET scan.  Recommend her to follow up with nephrology. If renal function is progressively worse, can consider empirically try MM treatment to see if that helps to improve renal function,    Lab Results  Component Value Date   MPROTEIN Not Observed 03/06/2024   KPAFRELGTCHN 225.0 (H) 03/06/2024   LAMBDASER 26.9 (H) 03/06/2024   KAPLAMBRATIO 8.36 (H) 03/06/2024    Labs reviewed and discussed with patient, urine M protein is progressing.  stable kidney function, normal calcium , slight decrease of hemoglobin. Patient declines kidney biopsy. No absolute evidence of organ damage.  Nov 2025 PET showed no bone metastatic disease.  I recommend continue observation.follow up in 4 months.

## 2024-03-16 NOTE — Progress Notes (Signed)
 Hematology/Oncology Progress note [phone] [phone]      CHIEF COMPLAINTS/REASON FOR VISIT:  Smoldering Multiple myeloma, monoclonal lymphocytosis.   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  M protein on UPEP has decreased.  Patient has no  hypercalcemia, Hb>10, no bone lesions on x-ray skeletal survey as well as PET scan.  Recommend her to follow up with nephrology. If renal function is progressively worse, can consider empirically try MM treatment to see if that helps to improve renal function,    Lab Results  Component Value Date   MPROTEIN Not Observed 03/06/2024   KPAFRELGTCHN 225.0 (H) 03/06/2024   LAMBDASER 26.9 (H) 03/06/2024   KAPLAMBRATIO 8.36 (H) 03/06/2024    Labs reviewed and discussed with patient, urine M protein is progressing.  stable kidney function, normal calcium , slight decrease of hemoglobin. Patient declines kidney biopsy. No absolute evidence of organ damage.  Nov 2025 PET showed no bone metastatic disease.  I recommend continue observation.follow up in 4 months.   Monoclonal B-cell lymphocytosis of unknown significance Previous flowcytometry showed minute clone of B cells. 1 % cells <5000 per ul. Continue observation.  Stable and normal total WBC and lymphocyte count.    Stage 4 chronic kidney disease (HCC) Encourage oral hydration avoid nephrotoxins.  Kidney biopsy was discussed and patient declined Continue follow-up with nephrology. His creatinine level fluctuates.  Discussed with patient that if her  kidney function declines sedoncary to progression of myeloma, will trigger treatments.   Orders Placed This Encounter  Procedures   CMP (Cancer Center only)    Standing Status:   Future    Expected Date:   07/15/2024    Expiration Date:   10/13/2024   CBC with Differential (Cancer Center Only)    Standing Status:   Future    Expected Date:   07/15/2024    Expiration Date:   10/13/2024   Multiple Myeloma Panel (SPEP&IFE w/QIG)    Standing Status:   Future    Expected Date:   07/15/2024    Expiration Date:   10/13/2024   Kappa/lambda light chains    Standing Status:   Future    Expected Date:   07/15/2024    Expiration Date:   10/13/2024   IFE+PROTEIN ELECTRO, 24-HR UR    Standing Status:   Future    Expected Date:   07/15/2024    Expiration Date:   10/13/2024   Follow-up in 4 months. All questions were answered. The patient knows to call the clinic with any problems, questions or concerns.  Call Babara, MD, PhD [facility] Hematology Oncology 03/16/2024     HISTORY OF PRESENTING ILLNESS:  Donna Malone is a  78 y.o.  female with PMH listed below who was referred to me for evaluation of leukocytosis Reviewed patient' recent labs obtained by PCP.   10/22/2019 CBC showed elevated white count of 11.3, predominantly lymphocytosis.  Normal hemoglobin and platelet count. Previous lab records reviewed. Leukocytosis onset of chronic, duration is since January 2021.   No aggravating or elevated factors. Associated symptoms or signs:  Denies weight loss, fever, chills,night sweats.  Endorses  fatigue Smoking history: Never smoker History of recent oral steroid use or steroid injection: No recent steroid injections. History of recent infection: Denies Autoimmune disease history.  Denies  Patient denies any chronic wound, prosthesis.  She has chronic knee arthritis.  #  flow cytometry showed CD5+ , CD23 + B-cell population.  CLL/SLL phenotype.  CD38 negative, <1% leukocytes,<5000 cells/ul #  06/29/2021, bone marrow biopsy showed hyper cellular marrow with plasma cell neoplasm, 10 to 15% plasma cell infiltrates, predominant.  Cytogenetics is normal.  Multiple myeloma FISH panel normal.  Standard risk.  Patient takes Lasix   for lower extremity swelling.  09/07/2022 US  renal ultrasound previously showed Mild bilateral renal atrophy as well as increased echogenicity of renal parenchyma INTERVAL HISTORY Donna Malone is a 78 y.o. female who has above history reviewed by me today presents for follow up smothering multiple myeloma. She feels well today. No new complaints.    Review of Systems  Constitutional:  Positive for fatigue. Negative for appetite change, chills and fever.  HENT:   Negative for hearing loss and voice change.   Eyes:  Negative for eye problems.  Respiratory:  Negative for chest tightness and cough.   Cardiovascular:  Negative for chest pain.  Gastrointestinal:  Negative for abdominal distention, abdominal pain and blood in stool.  Endocrine: Negative for hot flashes.  Genitourinary:  Negative for difficulty urinating and frequency.   Musculoskeletal:  Positive for arthralgias.  Skin:  Negative for itching and rash.  Neurological:  Negative for extremity weakness.  Hematological:  Negative for adenopathy.  Psychiatric/Behavioral:  Negative for confusion.      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   Need for tetanus booster 05/04/2021   Stage 3b chronic kidney disease (HCC) 07/13/2021   Thyroid  disease     SURGICAL HISTORY: Past Surgical History:  Procedure Laterality Date   ABDOMINAL HYSTERECTOMY   04/02/1970   partial   CHOLECYSTECTOMY  late 1990's   COLONOSCOPY WITH PROPOFOL  N/A 10/09/2021   Procedure: COLONOSCOPY WITH PROPOFOL ;  Surgeon: Therisa Bi, MD;  Location: [facility] ENDOSCOPY;  Service: Gastroenterology;  Laterality: N/A;   COLONOSCOPY WITH PROPOFOL  N/A 11/23/2022   Procedure: COLONOSCOPY WITH PROPOFOL ;  Surgeon: Therisa Bi, MD;  Location: [facility] ENDOSCOPY;  Service: Gastroenterology;  Laterality: N/A;   EYE SURGERY  06/10/19      07/15/19   LAPAROSCOPIC APPENDECTOMY N/A 03/14/2022   Procedure: APPENDECTOMY LAPAROSCOPIC;  Surgeon: Desiderio Schanz, MD;  Location: [facility] ORS;  Service: General;  Laterality: N/A;   POLYPECTOMY  11/23/2022   Procedure: POLYPECTOMY;  Surgeon: Therisa Bi, MD;  Location: [facility] ENDOSCOPY;  Service: Gastroenterology;;    SOCIAL HISTORY: Social History   Socioeconomic History   Marital status: Widowed    Spouse name: Cooper   Number of children: 3   Years of education: Not on file   Highest education level: 12th grade  Occupational History   Occupation: retired  Tobacco Use   Smoking status: Never   Smokeless tobacco: Never  Vaping Use   Vaping status: Never Used  Substance and Sexual Activity   Alcohol use: No  Drug use: No   Sexual activity: Not Currently  Other Topics Concern   Not on file  Social History Narrative   Not on file   Social Drivers of Health   Tobacco Use: Low Risk (03/16/2024)   Patient History    Smoking Tobacco Use: Never    Smokeless Tobacco Use: Never    Passive Exposure: Not on file  Financial Resource Strain: Low Risk (04/30/2022)   Overall Financial Resource Strain (CARDIA)    Difficulty of Paying Living Expenses: Not hard at all  Food Insecurity: No Food Insecurity (04/30/2022)   Hunger Vital Sign    Worried About Running Out of Food in the Last Year: Never true    Ran Out of Food in the Last Year: Never true  Transportation Needs: No Transportation Needs (04/30/2022)   PRAPARE - Scientist, Research (physical Sciences) (Medical): No    Lack of Transportation (Non-Medical): No  Physical Activity: Inactive (04/30/2022)   Exercise Vital Sign    Days of Exercise per Week: 0 days    Minutes of Exercise per Session: 0 min  Stress: No Stress Concern Present (04/30/2022)   [employer] of Occupational Health - Occupational Stress Questionnaire    Feeling of Stress : Not at all  Social Connections: Moderately Isolated (04/30/2022)   Social Connection and Isolation Panel    Frequency of Communication with Friends and Family: More than three times a week    Frequency of Social Gatherings with Friends and Family: Twice a week    Attends Religious Services: More than 4 times per year    Active Member of [employer] or Organizations: No    Attends Banker Meetings: Never    Marital Status: Widowed  Intimate Partner Violence: Not At Risk (04/30/2022)   Humiliation, Afraid, Rape, and Kick questionnaire    Fear of Current or Ex-Partner: No    Emotionally Abused: No    Physically Abused: No    Sexually Abused: No  Depression (PHQ2-9): Low Risk (12/20/2023)   Depression (PHQ2-9)    PHQ-2 Score: 3  Alcohol Screen: Low Risk (12/20/2023)   Alcohol Screen    Last Alcohol Screening Score (AUDIT): 0  Housing: Low Risk (04/30/2022)   Housing    Last Housing Risk Score: 0  Utilities: Not At Risk (04/30/2022)   AHC Utilities    Threatened with loss of utilities: No  Health Literacy: Not on file    FAMILY HISTORY: Family History  Problem Relation Age of Onset   CAD Mother    Heart attack Mother    Arthritis Mother    Heart disease Mother    Heart disease Father    Diabetes Cousin    Cancer Cousin    Diabetes Maternal Grandmother    Drug abuse Brother     ALLERGIES:  is allergic to sulfa antibiotics.  MEDICATIONS:  Current Outpatient Medications  Medication Sig Dispense Refill   Accu-Chek Softclix Lancets lancets Use as instructed 100 each 12   acetaminophen  (TYLENOL ) 500 MG tablet  Take 500 mg by mouth every 6 (six) hours as needed for mild pain or headache.     allopurinol  (ZYLOPRIM ) 100 MG tablet TAKE 1 TABLET BY MOUTH EVERY DAY 90 tablet 1   atorvastatin  (LIPITOR) 40 MG tablet TAKE 1 TABLET BY MOUTH EVERY DAY 90 tablet 1   B Complex Vitamins (GNP VITAMIN B COMPLEX PO) Take 1 tablet by mouth daily.     Blood Glucose Monitoring Suppl DEVI 1  each by Does not apply route in the morning, at noon, and at bedtime. May substitute to any manufacturer covered by patient's insurance. 1 each 0   cetirizine (ZYRTEC) 10 MG tablet Take 10 mg by mouth daily.     docusate sodium  (COLACE) 100 MG capsule Take 1 capsule (100 mg total) by mouth 2 (two) times daily. HOLD dose if having loose stools. 60 capsule 0   escitalopram  (LEXAPRO ) 20 MG tablet TAKE 1 TABLET BY MOUTH EVERY DAY 90 tablet 3   famotidine  (PEPCID ) 40 MG tablet TAKE 1 TABLET BY MOUTH EVERY DAY 90 tablet 0   KRILL OIL PO Take by mouth.     levothyroxine  (SYNTHROID ) 75 MCG tablet TAKE 1 TABLET BY MOUTH DAILY BEFORE BREAKFAST. 90 tablet 1   MAGnesium -Oxide 400 (240 Mg) MG tablet Take 1 tablet (400 mg total) by mouth daily. 30 tablet 1   meclizine  (ANTIVERT ) 25 MG tablet Take 1-2 tablets (25-50 mg total) by mouth 3 (three) times daily as needed for dizziness. 30 tablet 0   Multiple Vitamins-Minerals (HAIR SKIN & NAILS) TABS Take by mouth.     Multiple Vitamins-Minerals (PRESERVISION AREDS 2) CAPS Take 1 capsule by mouth in the morning and at bedtime.     ONETOUCH VERIO test strip USE IN THE MORNING, AT NOON, AND AT BEDTIME. 100 strip 0   Polyethyl Glycol-Propyl Glycol (SYSTANE HYDRATION PF OP) Place 1 drop into both eyes daily as needed (for dry eyes).     Semaglutide , 1 MG/DOSE, 4 MG/3ML SOPN Inject 1 mg as directed once a week. 9 mL 3   traZODone  (DESYREL ) 50 MG tablet TAKE 1 TABLET BY MOUTH EVERYDAY AT BEDTIME 90 tablet 0   Vitamin D , Ergocalciferol , (DRISDOL ) 1.25 MG (50000 UNIT) CAPS capsule TAKE 1 CAPSULE (50,000 UNITS  TOTAL) BY MOUTH EVERY 7 (SEVEN) DAYS. SUNDAY 13 capsule 1   No current facility-administered medications for this visit.     PHYSICAL EXAMINATION: ECOG PERFORMANCE STATUS: 1 - Symptomatic but completely ambulatory Vitals:   03/16/24 1036  BP: 131/72  Pulse: 70  Resp: 17  Temp: 98.6 F (37 C)  SpO2: 96%   Filed Weights   03/16/24 1036  Weight: 184 lb (83.5 kg)    Physical Exam Constitutional:      General: She is not in acute distress.    Comments: Patient walks independently  HENT:     Head: Normocephalic and atraumatic.  Eyes:     General: No scleral icterus. Cardiovascular:     Rate and Rhythm: Normal rate and regular rhythm.     Heart sounds: Normal heart sounds.  Pulmonary:     Effort: Pulmonary effort is normal. No respiratory distress.     Breath sounds: No wheezing.  Abdominal:     General: Bowel sounds are normal. There is no distension.     Palpations: Abdomen is soft.  Musculoskeletal:        General: No deformity. Normal range of motion.     Cervical back: Normal range of motion and neck supple.  Skin:    General: Skin is warm and dry.     Findings: No erythema or rash.  Neurological:     Mental Status: She is alert and oriented to person, place, and time. Mental status is at baseline.     Cranial Nerves: No cranial nerve deficit.     Coordination: Coordination normal.  Psychiatric:        Mood and Affect: Mood normal.  Latest Ref Rng & Units 03/06/2024   10:20 AM  CMP  Glucose 70 - 99 mg/dL 887   BUN 8 - 23 mg/dL 29   Creatinine 9.55 - 1.00 mg/dL 8.12   Sodium 864 - 854 mmol/L 139   Potassium 3.5 - 5.1 mmol/L 4.1   Chloride 98 - 111 mmol/L 105   CO2 22 - 32 mmol/L 21   Calcium  8.9 - 10.3 mg/dL 9.4   Total Protein 6.5 - 8.1 g/dL 6.8   Total Bilirubin 0.0 - 1.2 mg/dL 0.2   Alkaline Phos 38 - 126 U/L 120   AST 15 - 41 U/L 22   ALT 0 - 44 U/L 15       Latest Ref Rng & Units 03/06/2024   10:20 AM  CBC  WBC 4.0 - 10.5 K/uL 8.0    Hemoglobin 12.0 - 15.0 g/dL 88.7   Hematocrit 63.9 - 46.0 % 33.1   Platelets 150 - 400 K/uL 232      RADIOGRAPHIC STUDIES: I have personally reviewed the radiological images as listed and agreed with the findings in the report. No results found.   LABORATORY DATA:  I have reviewed the data as listed Lab Results  Component Value Date   WBC 8.0 03/06/2024   HGB 11.2 (L) 03/06/2024   HCT 33.1 (L) 03/06/2024   MCV 102.8 (H) 03/06/2024   PLT 232 03/06/2024   Recent Labs    07/18/23 1028 11/01/23 0907 03/06/24 1020  NA 136 134* 139  K 4.2 4.0 4.1  CL 107 106 105  CO2 19* 23 21*  GLUCOSE 126* 128* 112*  BUN 26* 22 29*  CREATININE 1.93* 1.67* 1.87*  CALCIUM  9.2 9.2 9.4  GFRNONAA 26* 31* 27*  PROT 6.4* 6.6 6.8  ALBUMIN 3.4* 3.7 3.8  AST 20 23 22   ALT 15 19 15   ALKPHOS 129* 95 120  BILITOT 0.7 0.6 0.2   Iron/TIBC/Ferritin/ %Sat    Component Value Date/Time   IRON 56 09/13/2023 1356   TIBC 285 09/13/2023 1356   FERRITIN 159 (H) 09/13/2023 1356   IRONPCTSAT 20 09/13/2023 1356

## 2024-03-16 NOTE — Assessment & Plan Note (Signed)
 Previous flowcytometry showed minute clone of B cells. 1 % cells <5000 per ul. Continue observation.  Stable and normal total WBC and lymphocyte count.

## 2024-03-16 NOTE — Assessment & Plan Note (Signed)
 Encourage oral hydration avoid nephrotoxins.  Kidney biopsy was discussed and patient declined Continue follow-up with nephrology. His creatinine level fluctuates.  Discussed with patient that if her kidney function declines sedoncary to progression of myeloma, will trigger treatments.

## 2024-04-06 ENCOUNTER — Other Ambulatory Visit: Payer: Self-pay | Admitting: Family Medicine

## 2024-04-08 ENCOUNTER — Telehealth: Payer: Self-pay | Admitting: Pharmacy Technician

## 2024-04-08 ENCOUNTER — Other Ambulatory Visit (HOSPITAL_COMMUNITY): Payer: Self-pay

## 2024-04-08 DIAGNOSIS — E1122 Type 2 diabetes mellitus with diabetic chronic kidney disease: Secondary | ICD-10-CM

## 2024-04-08 MED ORDER — BLOOD GLUCOSE TEST VI STRP: 1.0000 | ORAL_STRIP | Freq: Every day | 3 refills | Status: AC

## 2024-04-08 MED ORDER — BLOOD GLUCOSE MONITORING SUPPL DEVI: 1.0000 | Freq: Every day | 0 refills | Status: AC

## 2024-04-08 NOTE — Telephone Encounter (Signed)
 Pharmacy Patient Advocate Encounter   Received notification from [facility] KEY that prior authorization for OneTouch Verio strips is required/requested.   Insurance verification completed.   The patient is insured through [facility] ADVANTAGE/RX ADVANCE.   Per test claim:  Accu-check or Contour is preferred by the insurance.  If suggested medication is appropriate, Please send in a new RX and discontinue this one. If not, please advise as to why it's not appropriate so that we may request a Prior Authorization. Please note, some preferred medications may still require a PA.  If the suggested medications have not been trialed and there are no contraindications to their use, the PA will not be submitted, as it will not be approved.  This is a 2026 formulary change by the insurance plan.   Cmm Key# [id number]

## 2024-04-12 ENCOUNTER — Other Ambulatory Visit: Payer: Self-pay | Admitting: Family Medicine

## 2024-04-12 DIAGNOSIS — K219 Gastro-esophageal reflux disease without esophagitis: Secondary | ICD-10-CM

## 2024-04-16 ENCOUNTER — Other Ambulatory Visit: Payer: Self-pay | Admitting: Family Medicine

## 2024-04-16 DIAGNOSIS — F5104 Psychophysiologic insomnia: Secondary | ICD-10-CM

## 2024-04-16 NOTE — Telephone Encounter (Signed)
 Following up on therapy change to a preferred diabetic supply covered by the insurance?

## 2024-04-16 NOTE — Telephone Encounter (Signed)
 LOV- 12/20/2023 NOV- None LRF- 12/09/2023 Outpatient Medication Detail   Disp Refills Start End   traZODone  (DESYREL ) 50 MG tablet 90 tablet 0 12/09/2023 --   Sig: TAKE 1 TABLET BY MOUTH EVERYDAY AT BEDTIME   Sent to pharmacy as: traZODone  (DESYREL ) 50 MG tablet   E-Prescribing Status: Receipt confirmed by pharmacy (12/09/2023  1:19 PM EDT)

## 2024-04-17 ENCOUNTER — Telehealth: Payer: Self-pay

## 2024-04-17 ENCOUNTER — Other Ambulatory Visit: Payer: Self-pay

## 2024-04-17 NOTE — Telephone Encounter (Signed)
 Copied from CRM [id number]. Topic: Clinical - Medication Question >> Apr 17, 2024  2:04 PM Delon HERO wrote: Reason for CRM: Patient is calling to request medication to be changed from Rx #: [id number]  Semaglutide , 1 MG/DOSE, 4 MG/3ML SOPN [[id number]]  To monjouro. Patient reporting that the ozempic  is not working like it should. Patient was offered an appointment Patient denied. Soonest appointment is May 05, 2024.

## 2024-04-21 NOTE — Telephone Encounter (Signed)
 LVM requesting patient call back regarding Dr. Greggory question below.   Ok for E2C2 to gather more information from patient regarding what she meant by her ozempic  is not working as it should

## 2024-04-23 ENCOUNTER — Telehealth: Payer: Self-pay

## 2024-04-23 DIAGNOSIS — E1122 Type 2 diabetes mellitus with diabetic chronic kidney disease: Secondary | ICD-10-CM

## 2024-04-23 NOTE — Telephone Encounter (Signed)
 Please advise.    Copied from CRM #[id number]. Topic: Clinical - Medication Question >> Apr 23, 2024 10:09 AM Gustabo D wrote: Pt returned called and provide more info- Pt says she isn't losing weight and wants something with the GLP in it. Wants to switch back to Mounjaro 

## 2024-04-29 NOTE — Telephone Encounter (Signed)
 Please see telephone note from 04/23/24 regarding this message.

## 2024-05-04 MED ORDER — TIRZEPATIDE 2.5 MG/0.5ML ~~LOC~~ SOAJ: 2.5000 mg | SUBCUTANEOUS | 0 refills | Status: AC

## 2024-05-05 ENCOUNTER — Telehealth: Payer: Self-pay | Admitting: Pharmacy Technician

## 2024-05-05 ENCOUNTER — Other Ambulatory Visit (HOSPITAL_COMMUNITY): Payer: Self-pay

## 2024-05-05 NOTE — Telephone Encounter (Signed)
 Pharmacy Patient Advocate Encounter   Received notification from [employer] [facility] KEY that prior authorization for Mounjaro  2.5MG /0.5ML auto-injectors is required/requested.   Insurance verification completed.   The patient is insured through [facility] ADVANTAGE/RX ADVANCE.   Per test claim: PA required; PA started via CoverMyMeds. KEY [id number] . Waiting for clinical questions to populate.

## 2024-05-06 ENCOUNTER — Other Ambulatory Visit (HOSPITAL_COMMUNITY): Payer: Self-pay

## 2024-05-06 NOTE — Telephone Encounter (Signed)
 Called patient to inform her that the provider has sent Mounjaro  as a replacement for Ozempic , she stated that she had received a message from her pharmacy today letting her know that they had it.  Informed her to touch base with the pharmacy to see when it is expected to be picked up.  Patient verbalized understanding.

## 2024-05-06 NOTE — Telephone Encounter (Signed)
 Pharmacy Patient Advocate Encounter  Received notification from [facility] ADVANTAGE/RX ADVANCE that Prior Authorization for Mounjaro  2.5MG /0.5ML auto-injectors has been APPROVED from 05/05/24 to 05/05/25. Unable to obtain price due to refill too soon rejection, last fill date 05/06/24 next available fill date04/08/26   PA #/Case ID/Reference #: [id number]

## 2024-05-13 ENCOUNTER — Ambulatory Visit: Admitting: Family Medicine

## 2024-07-14 ENCOUNTER — Inpatient Hospital Stay

## 2024-07-28 ENCOUNTER — Inpatient Hospital Stay: Admitting: Oncology
